# Patient Record
Sex: Male | Born: 1955
Health system: Southern US, Community
[De-identification: ages and names within clinical notes are randomized; demographics above are authoritative.]

## PROBLEM LIST (undated history)

## (undated) DIAGNOSIS — E119 Type 2 diabetes mellitus without complications: Secondary | ICD-10-CM

## (undated) DIAGNOSIS — D696 Thrombocytopenia, unspecified: Secondary | ICD-10-CM

## (undated) DIAGNOSIS — M4800 Spinal stenosis, site unspecified: Secondary | ICD-10-CM

## (undated) DIAGNOSIS — M1990 Unspecified osteoarthritis, unspecified site: Secondary | ICD-10-CM

## (undated) DIAGNOSIS — R7303 Prediabetes: Secondary | ICD-10-CM

## (undated) DIAGNOSIS — I10 Essential (primary) hypertension: Secondary | ICD-10-CM

## (undated) DIAGNOSIS — J449 Chronic obstructive pulmonary disease, unspecified: Secondary | ICD-10-CM

## (undated) DIAGNOSIS — I2510 Atherosclerotic heart disease of native coronary artery without angina pectoris: Secondary | ICD-10-CM

## (undated) DIAGNOSIS — Z923 Personal history of irradiation: Secondary | ICD-10-CM

## (undated) DIAGNOSIS — E785 Hyperlipidemia, unspecified: Secondary | ICD-10-CM

## (undated) DIAGNOSIS — I4891 Unspecified atrial fibrillation: Secondary | ICD-10-CM

## (undated) DIAGNOSIS — I3139 Other pericardial effusion (noninflammatory): Secondary | ICD-10-CM

## (undated) DIAGNOSIS — C3491 Malignant neoplasm of unspecified part of right bronchus or lung: Secondary | ICD-10-CM

## (undated) DIAGNOSIS — R0602 Shortness of breath: Secondary | ICD-10-CM

## (undated) DIAGNOSIS — G4730 Sleep apnea, unspecified: Secondary | ICD-10-CM

## (undated) DIAGNOSIS — K219 Gastro-esophageal reflux disease without esophagitis: Secondary | ICD-10-CM

## (undated) DIAGNOSIS — M7540 Impingement syndrome of unspecified shoulder: Secondary | ICD-10-CM

## (undated) HISTORY — PX: HAND SURGERY: SHX662

## (undated) HISTORY — DX: Other pericardial effusion (noninflammatory): I31.39

## (undated) HISTORY — PX: EYE SURGERY: SHX253

## (undated) HISTORY — DX: Thrombocytopenia, unspecified: D69.6

## (undated) HISTORY — DX: Chronic obstructive pulmonary disease, unspecified: J44.9

## (undated) HISTORY — DX: Malignant neoplasm of unspecified part of right bronchus or lung: C34.91

## (undated) NOTE — *Deleted (*Deleted)
Thoracic Location of Tumor / Histology:  RIGHT upper lobe pulmonary nodule, mediastinal lymphadenopathy (suspicious for slow-growing non-small cell lung cancer)  Patient presented with symptoms of: patient was found to have a small right upper lobe pulmonary nodule and mediastinal lymphadenopathy on a follow-up CT scan on 03/14/2020 to do surveillance of his interstitial lung disease; the PET scan on 03/26/2020 showed that the nodule was hypermetabolic.  Biopsies revealed:  07/30/2020 FINAL MICROSCOPIC DIAGNOSIS:  A. LUNG, RUL, BRUSHING:  - No malignant cells identified  B. LUNG, RUL, FINE NEEDLE ASPIRATION:  - Non-diagnostic  C. LUNG, RUL, LAVAGE: - Atypical cells present  - See comment DIAGNOSTIC COMMENTS:  There are rare atypical cells present. There is no accompanying cell block for further possible workup  Tobacco/Marijuana/Snuff/ETOH use:  Current every day smoker (has smoked ~1/2 pack per day for over 50 years). Does not use smokeless tobacco, drink alcohol, or use illicit drugs  Past/Anticipated interventions by cardiothoracic surgery, if any:  08/06/2020 Dr. Levy Pupa --Enlarging right upper lobe pulmonary nodule, atypical cells on recent BAL.   --Suspect that this is slow-growing non-small cell lung cancer.  We will discuss his case in thoracic conference, ultimately would likely refer him for radiation, SBRT.   Given his functional capacity I do not believe he would be a good candidate for primary resection.  07/30/2020 Dr. Levy Pupa Flexible video fiberoptic bronchoscopy with electromagnetic navigation and biopsies  Past/Anticipated interventions by medical oncology, if any:  No referral placed at this time   Signs/Symptoms  Weight changes, if any: ***  Respiratory complaints, if any: ***  Hemoptysis, if any: ***  Pain issues, if any:  ***  SAFETY ISSUES:  Prior radiation? ***  Pacemaker/ICD? ***   Possible current pregnancy? N/A  Is the patient on  methotrexate? ***  Current Complaints / other details:  ***

---

## 1998-08-30 ENCOUNTER — Ambulatory Visit (HOSPITAL_COMMUNITY): Admission: RE | Admit: 1998-08-30 | Discharge: 1998-08-30 | Payer: Self-pay | Admitting: Orthopedic Surgery

## 1998-09-27 ENCOUNTER — Ambulatory Visit (HOSPITAL_COMMUNITY): Admission: RE | Admit: 1998-09-27 | Discharge: 1998-09-27 | Payer: Self-pay | Admitting: Family Medicine

## 1998-09-27 ENCOUNTER — Encounter: Payer: Self-pay | Admitting: Family Medicine

## 2000-05-07 ENCOUNTER — Encounter: Payer: Self-pay | Admitting: Family Medicine

## 2000-05-07 ENCOUNTER — Ambulatory Visit (HOSPITAL_COMMUNITY): Admission: RE | Admit: 2000-05-07 | Discharge: 2000-05-07 | Payer: Self-pay | Admitting: Family Medicine

## 2000-06-18 ENCOUNTER — Ambulatory Visit (HOSPITAL_COMMUNITY): Admission: RE | Admit: 2000-06-18 | Discharge: 2000-06-18 | Payer: Self-pay | Admitting: *Deleted

## 2002-06-23 ENCOUNTER — Encounter: Payer: Self-pay | Admitting: Family Medicine

## 2002-06-23 ENCOUNTER — Ambulatory Visit (HOSPITAL_COMMUNITY): Admission: RE | Admit: 2002-06-23 | Discharge: 2002-06-23 | Payer: Self-pay | Admitting: Family Medicine

## 2003-07-05 ENCOUNTER — Ambulatory Visit (HOSPITAL_BASED_OUTPATIENT_CLINIC_OR_DEPARTMENT_OTHER): Admission: RE | Admit: 2003-07-05 | Discharge: 2003-07-05 | Payer: Self-pay | Admitting: Family Medicine

## 2003-12-07 ENCOUNTER — Encounter: Admission: RE | Admit: 2003-12-07 | Discharge: 2003-12-07 | Payer: Self-pay | Admitting: Cardiology

## 2003-12-10 ENCOUNTER — Inpatient Hospital Stay (HOSPITAL_BASED_OUTPATIENT_CLINIC_OR_DEPARTMENT_OTHER): Admission: RE | Admit: 2003-12-10 | Discharge: 2003-12-10 | Payer: Self-pay | Admitting: Cardiology

## 2005-09-18 ENCOUNTER — Ambulatory Visit (HOSPITAL_COMMUNITY): Admission: RE | Admit: 2005-09-18 | Discharge: 2005-09-18 | Payer: Self-pay | Admitting: Orthopedic Surgery

## 2006-11-18 ENCOUNTER — Ambulatory Visit (HOSPITAL_BASED_OUTPATIENT_CLINIC_OR_DEPARTMENT_OTHER): Admission: RE | Admit: 2006-11-18 | Discharge: 2006-11-18 | Payer: Self-pay | Admitting: Orthopedic Surgery

## 2006-11-18 ENCOUNTER — Encounter (INDEPENDENT_AMBULATORY_CARE_PROVIDER_SITE_OTHER): Payer: Self-pay | Admitting: Specialist

## 2007-08-25 ENCOUNTER — Ambulatory Visit (HOSPITAL_BASED_OUTPATIENT_CLINIC_OR_DEPARTMENT_OTHER): Admission: RE | Admit: 2007-08-25 | Discharge: 2007-08-25 | Payer: Self-pay | Admitting: Orthopedic Surgery

## 2007-08-25 ENCOUNTER — Encounter (INDEPENDENT_AMBULATORY_CARE_PROVIDER_SITE_OTHER): Payer: Self-pay | Admitting: Orthopedic Surgery

## 2007-09-20 ENCOUNTER — Ambulatory Visit (HOSPITAL_BASED_OUTPATIENT_CLINIC_OR_DEPARTMENT_OTHER): Admission: RE | Admit: 2007-09-20 | Discharge: 2007-09-20 | Payer: Self-pay | Admitting: Orthopedic Surgery

## 2010-04-21 ENCOUNTER — Ambulatory Visit (HOSPITAL_COMMUNITY): Admission: RE | Admit: 2010-04-21 | Discharge: 2010-04-21 | Payer: Self-pay | Admitting: Family Medicine

## 2010-04-30 DIAGNOSIS — K219 Gastro-esophageal reflux disease without esophagitis: Secondary | ICD-10-CM | POA: Insufficient documentation

## 2010-04-30 DIAGNOSIS — I10 Essential (primary) hypertension: Secondary | ICD-10-CM | POA: Insufficient documentation

## 2010-04-30 DIAGNOSIS — J42 Unspecified chronic bronchitis: Secondary | ICD-10-CM | POA: Insufficient documentation

## 2010-04-30 DIAGNOSIS — I2510 Atherosclerotic heart disease of native coronary artery without angina pectoris: Secondary | ICD-10-CM | POA: Insufficient documentation

## 2010-05-02 ENCOUNTER — Ambulatory Visit: Payer: Self-pay | Admitting: Internal Medicine

## 2010-05-02 DIAGNOSIS — F17210 Nicotine dependence, cigarettes, uncomplicated: Secondary | ICD-10-CM | POA: Insufficient documentation

## 2010-05-02 DIAGNOSIS — Z77090 Contact with and (suspected) exposure to asbestos: Secondary | ICD-10-CM | POA: Insufficient documentation

## 2010-05-23 ENCOUNTER — Ambulatory Visit: Payer: Self-pay | Admitting: Internal Medicine

## 2010-07-07 ENCOUNTER — Ambulatory Visit: Payer: Self-pay | Admitting: Internal Medicine

## 2010-10-21 NOTE — Assessment & Plan Note (Signed)
Summary: Pulmonary/ ext summary f/u ov   Copy to:  Dr. Elias Else Primary Provider/Referring Provider:  Dr. Elias Else  CC:  Dyspnea- improved with symbicort and less humid weather.  History of Present Illness: 55 yowm still smoking seen for copd by [facility] in Pulmonary clinic in  2002 at wt 248 c/o dyspnea > cough.    May 02, 2010  1st pulmonary office eval in emr era with progressive doe xyears  to point where has trouble going up any up any kind of incline or more than slow pace flat and also trouble at night sleeping flat due to sob, wakes up and uses inhalter 4/7 nights  assoc with increased hb and dysphagia >  better on dexilant and zantac but breating no better x after saba.   assoc mild nasal congestion/ sneezing/ sore throat.  Please schedule a follow-up appointment in 4 weeks, sooner if needed with PFTs Work on inhaler technique:  relax and blow all the way out then take a nice smooth deep breath back in, triggering the inhaler at same time you start breathing in hold a few secs then rinse and gargle Start spiriva one capsule each am stop vit E  Try reduce your use of your rescue inhaler   July 07, 2010 Dyspnea- improved with symbicort and less humid weather, no change on spiriva so stopped it.  Pt denies any significant sore throat, dysphagia, itching, sneezing,  nasal congestion or excess secretions,  fever, chills, sweats, unintended wt loss, pleuritic or exertional cp, hempoptysis, change in activity tolerance  orthopnea pnd or leg swelling     Current Medications (verified): 1)  Symbicort 160-4.5 Mcg/act Aero (Budesonide-Formoterol Fumarate) .... 2 Puffs Two Times A Day 2)  Spiriva Handihaler 18 Mcg  Caps (Tiotropium Bromide Monohydrate) .... Two Puffs in Handihaler Daily One  Capsule Only- Stopped Taking 3)  Dexilant 60 Mg Cpdr (Dexlansoprazole) .... Take  One 30-60 Min Before First Meal of The Day- Finished Samples- Stopped Taking 4)  Aspirin 81 Mg Tbec (Aspirin)  .[name].. 1 Once Daily 5)  Ranitidine Hcl 300 Mg Tabs (Ranitidine Hcl) .[name].. 1 After Bfast and One At Bedtime 6)  Ventolin Hfa 108 (90 Base) Mcg/act Aers (Albuterol Sulfate) .... 2 Puffs Every 4-6 Hrs As Needed (Same As Proaire)  Allergies (verified): No Known Drug Allergies  Past History:  Past Medical History: HISTORY OF ASBESTOS EXPOSURE (ICD-V15.84) ESSENTIAL HYPERTENSION (ICD-401.9) CAD (ICD-414.00) COPD (ICD-496)/ AB     - PFT's 04/15/01  FEV1 2.81 ( 86%) ratio 86      - PFT's  05/23/10   FEV1 2.08 (71%) ratio 65,  Better by 12 % p B2, DLCO 59%     - HFA 75% p coaching May 02, 2010 >  90% July 07, 2010  GERD (ICD-530.81)  Vital Signs:  Patient profile:   55 year old male Weight:      254 pounds O2 Sat:      95 % on Room air Temp:     97.7 degrees F oral Pulse rate:   82 / minute BP sitting:   124 / 80  (left arm) Cuff size:   large  Vitals Entered ByVernie Murders (July 07, 2010 10:28 AM)  O2 Flow:  Room air  Physical Exam  Additional Exam:  amb obese wm with gruff voice wt 252 May 03, 2010 > 254 July 07, 2010  HEENT mild turbinate edema.  Oropharynx no thrush or excess pnd or cobblestoning.  No JVD or  cervical adenopathy. Mild accessory muscle hypertrophy. Trachea midline, nl thryroid. Chest was hyperinflated by percussion with diminished breath sounds and moderate increased exp time without wheeze. Hoover sign positive at mid inspiration. Regular rate and rhythm without murmur gallop or rub or increase P2 or edema.  Abd: no hsm, nl excursion. Ext warm without cyanosis or clubbing.     Clinical Reports Reviewed:  CXR:  03/11/2010: CXR Results:  Mild chronic bronchitis changes, no acute findings   Impression & Recommendations:  Problem # 1:  COPD (ICD-496) GOLD III but still smoking    DDX of  difficult airways managment all start with A and  include Adherence, Ace Inhibitors, Acid Reflux, Active Sinus Disease, Alpha 1 Antitripsin deficiency, Anxiety  masquerading as Airways dz,  ABPA,  allergy(esp in young), Aspiration (esp in elderly), Adverse effects of DPI,  Active smokers, plus one B  = Beta blocker use..    Adherence:  I spent extra time with the patient today explaining optimal mdi  technique.  This improved from  75-90%  ? Acid reflux:  rx with diet and h2  Active smoking :  see #3  Problem # 2:  SMOKER (ICD-305.1)  I took this opportunity to educate the patient regarding the consequences of smoking in airway disorders based on all the data we have from the multiple national lung health studies indicating that smoking cessation, not choice of inhalers or physicians, is the most important aspect of care.    I emphasized that although we never turn away smokers from the pulmonary clinic, we do ask that they understand that the recommendations that were made won't work nearly as well in the presence of continued cigarette exposure and we may reach a point where we can't help the patient if he/she can't quit smoking.    Orders: Est. Patient Level IV (16109)  Problem # 3:  HISTORY OF ASBESTOS EXPOSURE (ICD-V15.84)  Reviewed high risk of lung ca also  Orders: Est. Patient Level IV (60454)  Medications Added to Medication List This Visit: 1)  Spiriva Handihaler 18 Mcg Caps (Tiotropium bromide monohydrate) .... Two puffs in handihaler daily one  capsule only- stopped taking 2)  Dexilant 60 Mg Cpdr (Dexlansoprazole) .... Take  one 30-60 min before first meal of the day- finished samples- stopped taking  Patient Instructions: 1)  Stopping smoking is the only way to preserve your lung function at it's present level. 2)  Rec yearly chest xray  in June 3)  Please schedule a follow-up appointment as needed.

## 2010-10-21 NOTE — Miscellaneous (Signed)
Summary: Orders Update-pft charges//jwr  Clinical Lists Changes  Orders: Added new Service order of Carbon Monoxide diffusing w/capacity (94720) - Signed Added new Service order of Lung Volumes (94240) - Signed Added new Service order of Spirometry (Pre & Post) (94060) - Signed 

## 2010-10-21 NOTE — Assessment & Plan Note (Signed)
Summary: Pulmonary/ new pt eval for copd HFa 75% p coaching   Visit Type:  Initial Consult Copy to:  Dr. Elias Else Primary Provider/Referring Provider:  Dr. Elias Else  CC:  Dyspnea.  History of Present Illness: 81 yowm still smoking seen for copd by [facility] in Pulmonary clinic in  2002 at wt 248 c/o dyspnea > cough.    May 02, 2010  1st pulmonary office eval in emr era with progressive doe xyears  to point where has trouble going up any up any kind of incline or more than slow pace flat and also trouble at night sleeping flat due to sob, wakes up and uses inhalter 4/7 nights  assoc with increased hb and dysphagia >  better on dexilant and zantac but breating no better x after saba.   assoc mild nasal congestion/ sneezing/ sore throat.  Pt denies any significant  excess or purulent nasal secretions,  fever, chills, sweats, unintended wt loss, pleuritic or exertional cp, hempoptysis,fluctation  in poor activity tolerance  orthopnea pnd or leg swelling.  Pt also denies any obvious fluctuation in symptoms with weather or environmental change or other alleviating or aggravating factors.          Preventive Screening-Counseling & Management  Alcohol-Tobacco     Smoking Status: current     Smoking Cessation Counseling: yes  Current Medications (verified): 1)  Ventolin Hfa 108 (90 Base) Mcg/act Aers (Albuterol Sulfate) .... 2 Puffs Every 4-6 Hrs As Needed 2)  Symbicort 160-4.5 Mcg/act Aero (Budesonide-Formoterol Fumarate) .... 2 Puffs Two Times A Day 3)  Aspirin 81 Mg Tbec (Aspirin) .[name].. 1 Once Daily 4)  Ranitidine Hcl 300 Mg Tabs (Ranitidine Hcl) .[name].. 1 Two Times A Day 5)  Vitamin E 400 Unit Caps (Vitamin E) .[name].. 1 Once Daily 6)  Dexilant 60 Mg Cpdr (Dexlansoprazole) .[name].. 1 Once Daily  Allergies (verified): No Known Drug Allergies  Past History:  Past Medical History: HISTORY OF ASBESTOS EXPOSURE (ICD-V15.84) ESSENTIAL HYPERTENSION (ICD-401.9) CAD (ICD-414.00) COPD  (ICD-496)/ AB     - PFT's 04/15/01  FEV1 2.81 ( 86%) ratio 86      - HFA 75% p coaching May 02, 2010  GERD (ICD-530.81)  Past Surgical History: Eye surgery Hand surgery  Family History: Negative for respiratory diseases or atopy   Social History: Married Children Current smoker since age 51.  Smokes up to 3 ppd. 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  The patient denies productive cough, non-productive cough, coughing up blood, chest pain, irregular heartbeats, loss of appetite, abdominal pain, tooth/dental problems, headaches, nasal congestion/difficulty breathing through nose, itching, ear ache, depression, hand/feet swelling, rash, change in color of mucus, and fever.    Vital Signs:  Patient profile:   55 year old male Height:      65 inches Weight:      252 pounds BMI:     42.09 O2 Sat:      98 % on Room air Temp:     98.1 degrees F oral Pulse rate:   87 / minute BP sitting:   120 / 74  (right arm) Cuff size:   large  Vitals Entered By: Vernie Murders (May 02, 2010 9:51 AM)  O2 Flow:  Room air  Physical Exam  Additional Exam:  amb obese wm with gruff voice wt 252 May 03, 2010  HEENT mild turbinate edema.  Oropharynx no thrush or excess pnd or cobblestoning.  No JVD or cervical adenopathy. Mild accessory muscle hypertrophy. Trachea midline, nl thryroid. Chest was hyperinflated by percussion with diminished breath sounds and moderate increased exp time without wheeze. Hoover sign positive at mid inspiration. Regular rate and rhythm without murmur gallop or rub or increase P2 or edema.  Abd: no hsm, nl excursion. Ext warm without cyanosis or clubbing.     CXR  Procedure date:  03/11/2010  Findings:      Mild chronic bronchitis  changes, no acute findings  Impression & Recommendations:  Problem # 1:  COPD (ICD-496) Relatively mild clinically though with prob sign ab component suggested by his perceived need for saba.    DDX of  difficult airways managment all start with A and  include Adherence, Ace Inhibitors, Acid Reflux, Active Sinus Disease, Alpha 1 Antitripsin deficiency, Anxiety masquerading as Airways dz,  ABPA,  allergy(esp in young), Aspiration (esp in elderly), Adverse effects of DPI,  Active smokers, plus one B  = Beta blocker use.[name]   Active smoking biggest concern, see #2  Acid reflux better on dexilant, change 2nd dose of zantac to hs  Adherence:  I spent extra time with the patient today explaining optimal mdi  technique.  This improved from  50-75%  Problem # 2:  SMOKER (ICD-305.1)  I took this opportunity to educate the patient regarding the consequences of smoking in airway disorders based on all the data we have from the multiple national lung health studies indicating that smoking cessation, not choice of inhalers or physicians, is the most important aspect of care.    Discussed but not ready to committ to quit at this point - emphasized risks involved in continuing smoking and that patient should consider these in the context of the cost of smoking relative to the benefit obtained.   I emphasized that although we never turn away smokers from the pulmonary clinic, we do ask that they understand that the recommendations that were made won't work nearly as well in the presence of continued cigarette exposure and we may reach a point where we can't help the patient if he can't quit smoking.    Orders: Consultation Level V (14782)  Problem # 3:  HISTORY OF ASBESTOS EXPOSURE (ICD-V15.84)  No evidence of asbestos effects on most recent cxr  Orders: Consultation Level V (95621)  Medications Added to Medication List This Visit: 1)  Symbicort 160-4.5 Mcg/act Aero (Budesonide-formoterol fumarate)  .... 2 puffs two times a day 2)  Spiriva Handihaler 18 Mcg Caps (Tiotropium bromide monohydrate) .... Two puffs in handihaler daily one  capsule only 3)  Dexilant 60 Mg Cpdr (Dexlansoprazole) .... Take  one 30-60 min before first meal of the day 4)  Ranitidine Hcl 300 Mg Tabs (Ranitidine hcl) .[name].. 1 two times a day 5)  Vitamin E 400 Unit Caps (Vitamin e) .[name].. 1 once daily 6)  Dexilant 60 Mg Cpdr (Dexlansoprazole) .[name].. 1 once daily 7)  Aspirin 81 Mg Tbec (Aspirin) .[name].. 1 once daily 8)  Ventolin Hfa 108 (90 Base) Mcg/act Aers (Albuterol sulfate) .... 2 puffs every 4-6 hrs as needed 9)  Ranitidine Hcl 300 Mg Tabs (Ranitidine hcl) .[name].. 1 after bfast and one at bedtime 10)  Ventolin Hfa 108 (90 Base) Mcg/act Aers (Albuterol sulfate) .... 2 puffs every 4-6 hrs as needed (same as proaire)  Complete Medication List:  1)  Symbicort 160-4.5 Mcg/act Aero (Budesonide-formoterol fumarate) .... 2 puffs two times a day 2)  Spiriva Handihaler 18 Mcg Caps (Tiotropium bromide monohydrate) .... Two puffs in handihaler daily one  capsule only 3)  Dexilant 60 Mg Cpdr (Dexlansoprazole) .... Take  one 30-60 min before first meal of the day 4)  Aspirin 81 Mg Tbec (Aspirin) .[name].. 1 once daily 5)  Ranitidine Hcl 300 Mg Tabs (Ranitidine hcl) .[name].. 1 after bfast and one at bedtime 6)  Ventolin Hfa 108 (90 Base) Mcg/act Aers (Albuterol sulfate) .... 2 puffs every 4-6 hrs as needed (same as proaire)  Patient Instructions: 1)  Please schedule a follow-up appointment in 4 weeks, sooner if needed with PFTs 2)  Work on inhaler technique:  relax and blow all the way out then take a nice smooth deep breath back in, triggering the inhaler at same time you start breathing in hold a few secs then rinse and gargle 3)  Start spiriva one capsule each am 4)  stop vit E 5)  GERD (REFLUX)  is a common cause of respiratory symptoms. It commonly presents without heartburn and can be treated with medication, but also with lifestyle changes  including avoidance of late meals, excessive alcohol, smoking cessation, and avoid fatty foods, chocolate, peppermint, colas, red wine, and acidic juices such as orange juice. NO MINT OR MENTHOL PRODUCTS SO NO COUGH DROPS  6)  USE SUGARLESS CANDY INSTEAD (jolley ranchers)  7)  NO OIL BASED VITAMINS  8)  Try reduce your use of your rescue inhaler  Prescriptions: SPIRIVA HANDIHALER 18 MCG  CAPS (TIOTROPIUM BROMIDE MONOHYDRATE) Two puffs in handihaler daily one  capsule only  #30 x 11   Entered and Authorized by:   Nyoka Cowden MD   Signed by:   Nyoka Cowden MD on 05/02/2010   Method used:   Print then Give to Patient   RxID:   [id number]

## 2011-02-03 NOTE — Op Note (Signed)
NAMEDEAIRE, MCWHIRTER              ACCOUNT NO.:  [id number]   MEDICAL RECORD NO.:  [id number]          PATIENT TYPE:  AMB   LOCATION:  DSC                          FACILITY:  [facility]   PHYSICIAN:  Cindee Salt, M.D.       DATE OF BIRTH:  Feb 07, 1956   DATE OF PROCEDURE:  08/25/2007  DATE OF DISCHARGE:                               OPERATIVE REPORT   PREOPERATIVE DIAGNOSIS:  Nonunion attempted fusion, IP joint right  thumb.   POSTOPERATIVE DIAGNOSIS:  Nonunion attempted fusion, IP joint right  thumb.   OPERATION:  Incision and drainage with removal of hardware.   SURGEON:  Cindee Salt, M.D.   ASSISTANTCarolyne Fiscal, R.N.   ANESTHESIA:  Axillary block.   HISTORY:  The patient is a 55 year old male with a history of swelling  of the IP joint of his thumb with degenerative changes.  He has  undergone attempted fusion of this; however, has gone on to a nonunion  with some destruction of bone, which was felt to be due to his nonunion  site.  There has always been a concern that he may have an infectious  process, which has been unable to be established.  He is admitted now  for possible fusion, using infused bone graft with placement of extract  screw.  The patient is aware of risks and complications, including  continuation of the nonunion and the possibility of infection; injury to  arteries, nerves and tendons, and dystrophy.  He has elected to proceed  to have this done in the preoperative area.  The patient is seen,  questions encouraged and answered.   PROCEDURE:  The patient was brought to the operating room, where an  axillary block was carried out without difficulty.  He was prepped using  DuraPrep, supine position, right arm free.  The limb was exsanguinated  with an Esmarch bandage; tourniquet placed high on the arm and it was  inflated to 250 mmHg.  The old incision was used.  Drainage was  immediately encountered.  This was a serous type clear fluid.  This was  cultured for  both aerobic, anaerobic, fungal and atypical mycobacterial  infection.  The dissection was then carried down to the staples.  These  were found to be extremely loose.  These were then removed.  Significant  granulation tissue was present about these.  This was removed with a  curet.  This was also sent to pathology and for culture.  Both staples  were removed.   It was decided at this point and time not to proceed with attempted  fusion, and that this would leave behind foreign material both as the  bone graft and as fixation.  The wound was copiously irrigated with  saline.  The dorsal component of the wound over the extensor scar was  repaired with figure-of-eight 4-0 Vicryl Rapide sutures.  The  wounds over the 2 staples were left open and packed.  This was done  after irrigation.  A sterile compressive dressing and splint was  applied.  The patient tolerated the procedure well and was taken to  the  recovery room for observation in satisfactory condition.  He will be  discharged to home; to return in 1 week.  On Vicodin.           ______________________________  Cindee Salt, M.D.     GK/MEDQ  D:  08/25/2007  T:  08/25/2007  Job:  [id number]

## 2011-02-03 NOTE — Op Note (Signed)
NAME:  Daniel Ball, FLATT NO.:  [id number]   MEDICAL RECORD NO.:  [id number]          PATIENT TYPE:  AMB   LOCATION:  DSC                          FACILITY:  [facility]   PHYSICIAN:  Cindee Salt, M.D.       DATE OF BIRTH:  November 17, 1955   DATE OF PROCEDURE:  09/20/2007  DATE OF DISCHARGE:                               OPERATIVE REPORT   PREOPERATIVE DIAGNOSIS:  Nonunion interphalangeal joint, right thumb.   POSTOPERATIVE DIAGNOSIS:  Nonunion interphalangeal joint, right thumb.   OPERATION:  Acutrak screw implantation for fusion IP joint with InFUSE  bone graft, right thumb.   SURGEON:  Cindee Salt, M.D.   ASSISTANTCarolyne Fiscal, R.N.   ANESTHESIA:  Axillary block.   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  He has  undergone attempted fusion with Memodyne staples.  This however has gone  to nonunion with destruction of the bone.  He has had this biopsied with  cultures taken.  These have turned out to be negative.  He is admitted  now for InFUSE bone grafting with Acutrak screw fixation to his right  thumb IP joint.  He is aware of risks and complications including  infection, nonunion, injury to arteries, nerves, tendons, incomplete  relief of symptoms, dystrophy.  In the preoperative area questions were  again encouraged and answered, the extremity marked by both patient and  surgeon, antibiotic given.   PROCEDURE:  The patient was brought to the operating room where an  axillary block was carried out without difficulty.  He was prepped using  DuraPrep, supine position, right arm free.  The limb was exsanguinated  with an Esmarch bandage, tourniquet placed high on the arm, was inflated  to 250 mmHg.  An H incision was used, carried down through subcutaneous  tissue, through the extensor tendon.  The nonunion site was immediately  apparent.  This was fully debrided, no purulence was seen.   Soft tissue  was removed from bone ends until bleeding was assured.  A regular guide  pin for an Acutrak screw was then placed under image intensification  through the distal phalanx.  This was then replaced in a retrograde  manner with the blunt end.  An InFUSE 7.7 mL graft was then soaked, the  guide pin was then passed across into the proximal phalanx under image  intensification with the IP joint in an extended position.  A 20 mm mini  screw was then selected.  The guide pin was then re-selected for a mini  guide pin.  This was then placed and incision made distally.  The 20 mm  mini Acutrak was then passed through the distal phalanx until it began  to emerge from the distal phalanx proximally.  After a 15 minutes soak  time the InFUSE bone grafts were placed on either side of the guide pin  after cutting it in half.  The bone was then compressed, irrigation was  performed prior to placement of the InFUSE bone graft.  The  Acutrak  screw was then passed across the interphalangeal joint.  The guide pin  removed once the screw head engaged the proximal phalanx, and the screw  was driven so it was half in and the distal half the proximal phalanx.  This firmly fixed the position.  X-rays confirmed positioning.  The  wounds were then closed with interrupted 5-0 Vicryl Rapide sutures  including the stab wound through the pulp of the thumb.  The tourniquet  was deflated.  The thumb immediately pinked.  A sterile compressive  dressing, thumb spica splint was applied.  The patient tolerated the  procedure well and was taken to the recovery room for observation in  satisfactory condition.  He will be discharged home to return to the  [facility] in 1 week on Percocet and doxycycline.           ______________________________  Cindee Salt, M.D.     GK/MEDQ  D:  09/20/2007  T:  09/21/2007  Job:  [id number]

## 2011-02-06 NOTE — Cardiovascular Report (Signed)
NAME:  Daniel Ball, Daniel Ball                        ACCOUNT NO.:  [id number]   MEDICAL RECORD NO.:  [id number]                   PATIENT TYPE:  OIB   LOCATION:  6501                                 FACILITY:  [facility]   PHYSICIAN:  W. Ashley Royalty., M.D.         DATE OF BIRTH:  May 06, 1956   DATE OF PROCEDURE:  12/10/2003  DATE OF DISCHARGE:  12/10/2003                              CARDIAC CATHETERIZATION   HISTORY:  The patient is a 55 year old male who has obesity, hypertension,  hyperlipidemia and severe dyspnea on exertion.  He has a previous Cardiolite  scan that was indeterminate.  He continues to complain of severe dyspnea.  Catheterization is advised to evaluate for significant coronary disease.   COMMENTS ABOUT PROCEDURE:  The patient tolerated the procedure well without  difficulty.  5 French catheters were used throughout the case.  Following  the procedure, he had good hemostasis and peripheral pulses noted.  He  required Versed 2 mg IV for sedation during the case.   HEMODYNAMIC DATA:  1. Aorta postcontrast:  120/73.  2. LV postcontrast:  120/6-11.   ANGIOGRAPHIC DATA:   LEFT VENTRICULOGRAM:  Performed in the 30 degree RAO projection.  The aortic  valve was normal.  The mitral valve was normal.  The left ventricle appears  normal in size.  The estimated ejection fraction is 60%.  Wall motion  appears normal.  Coronary arteries arise and distribute normally.  Calcification is seen involving the left coronary system.  There is minimal  calcification involved in the right coronary artery.  The left main coronary  artery is mildly calcified, but appears normal.   Left anterior descending:  Calcified proximally.  There is segmental 30%  proximal stenosis and some 20% mid vessel stenosis noted.  The vessel  extends around the apex and contains otherwise no significant disease.   The circumflex coronary artery has a high first marginal or intermediate  branch that appears  free of disease.  Contains a second marginal branch that  is also free of disease.   Right coronary artery:  Proximal 40% stenosis.  There is a segmental  eccentric 40-50% mid vessel stenosis noted.  Vessel supplies posterior  descending and a posterior lateral branch.   IMPRESSION:  1. Coronary artery disease with mild-to-moderate disease involving the left     anterior descending and right coronary artery.  2. Normal left ventricular function.   RECOMMENDATIONS:  Intensive medical therapy at this time.  He will need to  lose weight, stop smoking, control cholesterol and control blood pressure.                                               Darden Palmer., M.D.    WST/MEDQ  D:  12/10/2003  T:  12/10/2003  Job:  [id number]   cc:   Meredith Staggers, M.D.  [address], [address]  [place]  [place] [address]  Fax: [phone]

## 2011-02-06 NOTE — Op Note (Signed)
NAME:  TASHEEM, ELMS NO.:  [id number]   MEDICAL RECORD NO.:  [id number]          PATIENT TYPE:  AMB   LOCATION:  DSC                          FACILITY:  [facility]   PHYSICIAN:  Cindee Salt, M.D.       DATE OF BIRTH:  Aug 26, 1956   DATE OF PROCEDURE:  11/18/2006  DATE OF DISCHARGE:                               OPERATIVE REPORT   PREOPERATIVE DIAGNOSIS:  Arthritis right thumb interphalangeal joint.   POSTOPERATIVE DIAGNOSIS:  Arthritis right thumb interphalangeal joint.   OPERATION:  Fusion interphalangeal joint using Ossi staples.   SURGEON:  Cindee Salt, M.D.   ASSISTANT:  Carolyne Fiscal R.N.   ANESTHESIA:  Axillary block.   HISTORY:  The patient is a 55 year old male with a history of arthritis  of the IP joint of his thumb not responsive to conservative treatment.  He has elected to proceed with fusion of the IP joint.  He is aware of  risks and complications including nonunion, infection, injury to  arteries, nerves, tendons and incomplete relief of symptoms and  dystrophy.  The possibility of nail irregularity, the possibility of  breakdown of internal fixation.  In the preoperative area questions were  encouraged and answered.  The extremity marked by both the patient and  surgeon.  Antibiotic was given.   PROCEDURE:  The patient was brought to the operating room where an  axillary block was carried out without difficulty.  We was prepped using  DuraPrep, supine position, right arm free.  After 3-minute dry time he  was draped.  The limb was exsanguinated with an Esmarch bandage.  Tourniquet placed high on the arm was inflated 250 mmHg and H incision  was made over the IP joint of the thumb, carried down through  subcutaneous tissue.  Bleeders were electrocauterized.  A very  significant synovitis was present to the joint.  This was debrided after  incising the extensor tendon.  A synovectomy was performed.  Cultures  were taken both aerobic, anaerobic cultures.   The collateral ligaments  were then incised allowing book opening of the IP joint.  A rongeur was  then used to remove the remaining cartilage, subchondral bone.  A tenon  and trough were then fabricated in the proximal and distal phalanges.  The joint was then pinned with a 35 K-wire for stability.  X-rays the AP  lateral direction revealed a good position with approximately 5 degree  flexion.  The nail matrix was then lifted off from the distal phalanx,  allowing placement of two Ossi staples on the radial side.  A 11 mm  staple on the ulnar side and 9 mm staple were inserted.  These were 7 mm  in depth and 11 mm in depth and following placement of the two staples  impacting them, x-rays confirmed positioning.  The staples were heated  revealing excellent compression to the joint confirmed with image  intensification.  The wound was copiously irrigated with saline.  The  longitudinal pin removed.  The extensor tendon was then repaired with  figure-of-eight 4-0 Vicryl sutures and the skin with interrupted 5-0  nylon  sutures.  Sterile compressive dressing splint to the thumb was applied.  Thumb was extremely stable on discharge prior to placement of the splint  with testing.  The patient was taken to the recovery room for  observation in satisfactory condition.  He is discharged home to return  to the [facility] in one week on Vicodin.           ______________________________  Cindee Salt, M.D.     GK/MEDQ  D:  11/18/2006  T:  11/18/2006  Job:  [id number]

## 2011-06-26 LAB — BASIC METABOLIC PANEL
BUN: 15
Chloride: 102
Potassium: 4.1

## 2011-06-26 LAB — POCT HEMOGLOBIN-HEMACUE
Hemoglobin: 17.4 — ABNORMAL HIGH
Operator id: 128471

## 2011-06-29 LAB — ANAEROBIC CULTURE

## 2011-06-29 LAB — BASIC METABOLIC PANEL
GFR calc non Af Amer: >60
Glucose, Bld: 101 — ABNORMAL HIGH
Potassium: 5
Sodium: 138

## 2011-06-29 LAB — AFB CULTURE WITH SMEAR (NOT AT ARMC)
Acid Fast Smear: NONE SEEN
Acid Fast Smear: NONE SEEN

## 2011-06-29 LAB — WOUND CULTURE: Culture: NO GROWTH

## 2011-06-29 LAB — POCT HEMOGLOBIN-HEMACUE
Hemoglobin: 16.9
Operator id: 128471

## 2011-06-29 LAB — TISSUE CULTURE: Gram Stain: NONE SEEN

## 2011-06-29 LAB — FUNGUS CULTURE W SMEAR

## 2012-09-07 ENCOUNTER — Other Ambulatory Visit: Payer: Self-pay | Admitting: Specialist

## 2012-09-07 DIAGNOSIS — M549 Dorsalgia, unspecified: Secondary | ICD-10-CM

## 2012-09-16 ENCOUNTER — Ambulatory Visit
Admission: RE | Admit: 2012-09-16 | Discharge: 2012-09-16 | Disposition: A | Discharge status: Home / Self Care | Payer: PRIVATE HEALTH INSURANCE | Source: Ambulatory Visit | Attending: Specialist | Admitting: Specialist

## 2012-09-16 ENCOUNTER — Other Ambulatory Visit: Payer: Self-pay | Admitting: Specialist

## 2012-09-16 ENCOUNTER — Inpatient Hospital Stay
Admission: RE | Admit: 2012-09-16 | Discharge: 2012-09-16 | Disposition: A | Discharge status: Home / Self Care | Payer: Self-pay | Source: Ambulatory Visit | Attending: Specialist | Admitting: Specialist

## 2012-09-16 VITALS — BP 131/68 | HR 61

## 2012-09-16 DIAGNOSIS — M549 Dorsalgia, unspecified: Secondary | ICD-10-CM

## 2012-09-16 MED ORDER — IOHEXOL 180 MG/ML  SOLN
20.0000 mL | Freq: Once | INTRAMUSCULAR | Status: AC | PRN
  Administered 2012-09-16: 20 mL via INTRATHECAL

## 2012-09-16 MED ORDER — DIAZEPAM 5 MG PO TABS
10.0000 mg | ORAL_TABLET | Freq: Once | ORAL | Status: AC
  Administered 2012-09-16: 10 mg via ORAL

## 2012-11-05 ENCOUNTER — Other Ambulatory Visit: Payer: Self-pay

## 2013-07-27 ENCOUNTER — Other Ambulatory Visit: Payer: Self-pay

## 2013-09-29 ENCOUNTER — Other Ambulatory Visit: Payer: Self-pay | Admitting: Cardiology

## 2013-09-29 ENCOUNTER — Ambulatory Visit
Admission: RE | Admit: 2013-09-29 | Discharge: 2013-09-29 | Disposition: A | Discharge status: Home / Self Care | Payer: PRIVATE HEALTH INSURANCE | Source: Ambulatory Visit | Attending: Cardiology | Admitting: Cardiology

## 2013-09-29 DIAGNOSIS — R0989 Other specified symptoms and signs involving the circulatory and respiratory systems: Principal | ICD-10-CM

## 2013-09-29 DIAGNOSIS — R0609 Other forms of dyspnea: Secondary | ICD-10-CM

## 2013-10-30 ENCOUNTER — Other Ambulatory Visit: Payer: Self-pay | Admitting: Cardiology

## 2013-10-30 DIAGNOSIS — R0602 Shortness of breath: Secondary | ICD-10-CM

## 2013-10-31 ENCOUNTER — Ambulatory Visit (INDEPENDENT_AMBULATORY_CARE_PROVIDER_SITE_OTHER): Payer: PRIVATE HEALTH INSURANCE | Admitting: Internal Medicine

## 2013-10-31 DIAGNOSIS — R0602 Shortness of breath: Secondary | ICD-10-CM

## 2013-10-31 LAB — PULMONARY FUNCTION TEST
DL/VA % pred: 96 %
DL/VA: 4.07 ml/min/mmHg/L
DLCO unc % pred: 83 %
DLCO unc: 20.35 ml/min/mmHg
FEF 25-75 Post: 1.53 L/sec
FEF 25-75 Pre: 1.56 L/sec
FEF2575-%CHANGE-POST: -1 %
FEF2575-%Pred-Post: 62 %
FEF2575-%Pred-Pre: 64 %
FEV1-%CHANGE-POST: 1 %
FEV1-%PRED-PRE: 86 %
FEV1-%Pred-Post: 88 %
FEV1-POST: 2.55 L
FEV1-Pre: 2.5 L
FEV1FVC-%CHANGE-POST: 5 %
FEV1FVC-%Pred-Pre: 94 %
FEV6-%Change-Post: -3 %
FEV6-%PRED-POST: 92 %
FEV6-%Pred-Pre: 95 %
FEV6-PRE: 3.46 L
FEV6-Post: 3.33 L
FEV6FVC-%Change-Post: 0 %
FEV6FVC-%PRED-PRE: 104 %
FEV6FVC-%Pred-Post: 104 %
FVC-%Change-Post: -3 %
FVC-%Pred-Post: 88 %
FVC-%Pred-Pre: 91 %
FVC-POST: 3.36 L
FVC-Pre: 3.48 L
POST FEV1/FVC RATIO: 76 %
POST FEV6/FVC RATIO: 99 %
Pre FEV1/FVC ratio: 72 %
Pre FEV6/FVC Ratio: 99 %
RV % PRED: 110 %
RV: 2.11 L
TLC % PRED: 96 %
TLC: 5.57 L

## 2013-10-31 NOTE — Progress Notes (Signed)
PFT done today. 

## 2014-01-09 ENCOUNTER — Ambulatory Visit (INDEPENDENT_AMBULATORY_CARE_PROVIDER_SITE_OTHER): Payer: PRIVATE HEALTH INSURANCE | Admitting: Internal Medicine

## 2014-01-09 ENCOUNTER — Encounter: Payer: Self-pay | Admitting: Internal Medicine

## 2014-01-09 ENCOUNTER — Ambulatory Visit (INDEPENDENT_AMBULATORY_CARE_PROVIDER_SITE_OTHER)
Admission: RE | Admit: 2014-01-09 | Discharge: 2014-01-09 | Disposition: A | Discharge status: Home / Self Care | Payer: PRIVATE HEALTH INSURANCE | Source: Ambulatory Visit | Attending: Internal Medicine | Admitting: Internal Medicine

## 2014-01-09 VITALS — BP 140/84 | HR 69 | Temp 97.9°F | Ht 65.0 in | Wt 244.0 lb

## 2014-01-09 DIAGNOSIS — J42 Unspecified chronic bronchitis: Secondary | ICD-10-CM

## 2014-01-09 DIAGNOSIS — R0609 Other forms of dyspnea: Secondary | ICD-10-CM

## 2014-01-09 DIAGNOSIS — R0600 Dyspnea, unspecified: Secondary | ICD-10-CM

## 2014-01-09 DIAGNOSIS — R0989 Other specified symptoms and signs involving the circulatory and respiratory systems: Secondary | ICD-10-CM

## 2014-01-09 NOTE — Patient Instructions (Signed)
Please remember to go to the  x-ray department downstairs for your tests - we will call you with the results when they are available.  The key is to stop smoking completely before smoking completely stops you - you will need to stop for at least two weeks to reduce your risk of post op complications main ones are lung collapse, pneumonia and respiratory failures.

## 2014-01-09 NOTE — Progress Notes (Signed)
Quick Note:  Spoke with pt and notified of results per Dr. Wert. Pt verbalized understanding and denied any questions.  ______ 

## 2014-01-09 NOTE — Assessment & Plan Note (Addendum)
-   spirometry s sign obst so main issue is acquired (from smoking ) poor mucociliary function places him at risk of post op complications  See instructions for specific recommendations which were reviewed directly with the patient who was given a copy with highlighter outlining the key components.   Needs to stop smoking x 2 preop and may need periop duoneb/ early mobilization but otherwise no concerns and cleared for surgery

## 2014-01-09 NOTE — Assessment & Plan Note (Signed)
-   PFT's 10/31/13 wnl except for FEF- 25-75   Mostly limited by obesity/ back/deconditioning and not a contraindication to surgery

## 2014-01-09 NOTE — Progress Notes (Signed)
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 Pulmonary Consult    Pt needs pulmonary clearance for back surgery. He states his breathing is doing well and denies any respiratory co's at this time.    each am note some cough/ congestion/ gray brown x maybe a tbsp over the first hour, worst in winter.  No obvious other patterns in day to day or daytime variabilty or assoc limiting sob or  cp or chest tightness, subjective wheeze overt sinus or hb symptoms. No unusual exp hx or h/o childhood pna/ asthma or knowledge of premature birth.  Sleeping ok without nocturnal  or early am exacerbation  of respiratory  c/o's or need for noct saba. Also denies any obvious fluctuation of symptoms with weather or environmental changes or other aggravating or alleviating factors except as outlined above   Current Medications, Allergies, Complete Past Medical History, Past Surgical History, Family History, and Social History were reviewed in [employer] record.             Review of Systems  Constitutional: Negative for fever, chills, activity change, appetite change and unexpected weight change.  HENT: Negative for congestion, dental problem, postnasal drip, rhinorrhea, sneezing, sore throat, trouble swallowing and voice change.   Eyes: Negative for visual disturbance.  Respiratory: Negative for cough, choking and shortness of breath.   Cardiovascular: Negative for chest pain and leg swelling.  Gastrointestinal: Negative for nausea, vomiting and abdominal pain.  Genitourinary: Negative for difficulty urinating.  Musculoskeletal: Positive for arthralgias.   Skin: Negative for rash.  Psychiatric/Behavioral: Negative for behavioral problems and confusion.       Objective:   Physical Exam  amb hoarse wm nad  Wt Readings from Last 3 Encounters:  01/09/14 244 lb (110.678 kg)  07/07/10 254 lb (115.214 kg)  05/02/10 252 lb (114.306 kg)       HEENT: nl dentition, turbinates, and orophanx. Nl external ear canals without cough reflex   NECK :  without JVD/Nodes/TM/ nl carotid upstrokes bilaterally   LUNGS: no acc muscle use, clear to A and P bilaterally without cough on insp or exp maneuvers   CV:  RRR  no s3 or murmur or increase in P2, no edema   ABD:  Quite obese but soft and nontender with nl excursion in the supine position. No bruits or organomegaly, bowel sounds nl  MS:  warm without deformities, calf tenderness, cyanosis or clubbing  SKIN: warm and dry without lesions    NEURO:  alert, approp, no deficits    cxr 09/29/13 1. Question subtle left perihilar airspace opacification, which can  be seen with pneumonia. Followup to clearing or CT chest without  contrast is recommended in further evaluation as malignancy cannot  be definitively excluded. This will be a call report.  2. Question very mild central interstitial prominence, which can be  seen with pulmonary vascular congestion or a viral process.  CXR  01/09/2014 :  Stable cardiac mediastinal contours. Stable parahilar bronchial wall thickening. No large consolidative pulmonary opacities. Minimal bibasilar heterogeneous opacities. No definite pleural effusion or pneumothorax. Mid thoracic spine  degenerative change.      Assessment & Plan:

## 2014-01-30 ENCOUNTER — Encounter (HOSPITAL_COMMUNITY): Payer: Self-pay | Admitting: Pharmacy Technician

## 2014-01-30 NOTE — Progress Notes (Signed)
NEED PRE OP ORDERS PLEASE-- PRE OP 02/01/14  Thanks

## 2014-01-31 ENCOUNTER — Other Ambulatory Visit: Payer: Self-pay | Admitting: Orthopedic Surgery

## 2014-01-31 NOTE — H&P (Signed)
Daniel Ball is an 58 y.o. male.   Chief Complaint: back and leg pain HPI: Daniel Ball reports >5 years of gradually worsening back and B/L LE pain, weakness and numbness, limiting his ambulatory capacity. Refractory to multiple ESIs, activity modifications, relative rest, HEP, medications.   Past Medical History  Diagnosis Date  . COPD (chronic obstructive pulmonary disease)     No past surgical history on file.  No family history on file. Social History:  reports that he has been smoking Cigarettes.  He has a 135 pack-year smoking history. He has never used smokeless tobacco. He reports that he does not drink alcohol or use illicit drugs.  Allergies: No Known Allergies   (Not in a hospital admission)  No results found for this or any previous visit (from the past 48 hour(s)). No results found.  Review of Systems  Constitutional: Negative.   HENT: Negative.   Eyes: Negative.   Respiratory: Negative.   Cardiovascular: Negative.   Gastrointestinal: Negative.   Genitourinary: Negative.   Musculoskeletal: Positive for back pain and joint pain.  Skin: Negative.   Neurological: Positive for sensory change and focal weakness.  Psychiatric/Behavioral: Negative.     There were no vitals taken for this visit. Physical Exam  Constitutional: He is oriented to person, place, and time. He appears well-developed and well-nourished.  HENT:  Head: Normocephalic and atraumatic.  Eyes: Conjunctivae and EOM are normal. Pupils are equal, round, and reactive to light.  Neck: Normal range of motion. Neck supple.  Cardiovascular: Normal rate and regular rhythm.   Respiratory: Effort normal and breath sounds normal.  GI: Soft. Bowel sounds are normal.  Musculoskeletal:  Lower extremities just have pain in the buttocks and thighs. Slight quadriceps weakness is noted. He is hyporeflexic. There is no Babinski or clonus.  Lumbar spine exam reveals no evidence of soft tissue swelling,  ecchymosis or deformity. The abdomen is soft and nontender. Nontender over the trochanters. No cellulitis or lymphadenopathy.  Good range of motion of the lumbar spine without associated pain. Straight leg raise produces low back and buttock pain bilaterally. He is hyperreflexic. Motor is 5/5. Sensory exam is intact. No Babinski or clonus. The patient has good distal pulses. No DVT. No pain and normal range of motion without instability of the hips, knees and ankles.  Neurological: He is alert and oriented to person, place, and time. He has normal reflexes.  Skin: Skin is warm and dry.  Psychiatric: He has a normal mood and affect.    MRI and CT myelogram demonstrate fairly stenotic lesions at multiple levels.  Assessment/Plan Spinal stenosis Neurogenic claudication secondary to severe spinal stenosis at 2-3, 3-4 and 4-5. Some retrolisthesis at 2-3.  I had an extensive discussion with Daniel Ball concerning his current pathology, relevant anatomy and treatment options. Concerning his current shoulder, we discussed corticosteroid injections, activity modification and possible eventual shoulder arthroplasty, but I don't feel it's at the point where it would require that. In terms of his lumbar spine, we previously discussed lumbar decompression at 2-3, 3-4 and 4-5.  Risks and benefits of this procedure were discussed with the patient including worsening of symptoms, no changes in symptoms, recurrent disc herniation, scar tissue, epidural fibrosis, damage to neurovascular structures, cerebral spinal fluid leak which would require repair or patching, DVT, PE, anesthetic complications, etc. We discussed the perioperative course, the hospitalization, and the need for postoperative rehabilitation and the time estimate for recovery. We also discussed the possibility of future surgery including repeat  decompression, fusion. The patient was provided an illustrated handout which was discussed in  detail. Appropriate anatomic models were used as well.  He will require preoperative clearance by Dr. Alyson Ingles and Dr. Wynonia Lawman. Neutral positioning of the cervical spine. Also, I feel that we have eliminated severe stenosis noted in the cervical spine. I don't feel there is any surgical intervention of the cervical spine at this point in time. Possible selective nerve root block at C4-5 on the left. We will proceed with scheduling of his lumbar decompression. I gave him a note for out of work.  Plan microlumbar decompression L2-3, L3-4, L4-5  Jaclyn M. Bissell PA-C for Dr. Tonita Cong 01/31/2014, 2:13 PM

## 2014-02-01 ENCOUNTER — Ambulatory Visit (HOSPITAL_COMMUNITY)
Admission: RE | Admit: 2014-02-01 | Discharge: 2014-02-01 | Disposition: A | Discharge status: Home / Self Care | Payer: PRIVATE HEALTH INSURANCE | Source: Ambulatory Visit | Attending: Orthopedic Surgery | Admitting: Orthopedic Surgery

## 2014-02-01 ENCOUNTER — Encounter (HOSPITAL_COMMUNITY): Payer: Self-pay

## 2014-02-01 ENCOUNTER — Encounter (HOSPITAL_COMMUNITY)
Admission: RE | Admit: 2014-02-01 | Discharge: 2014-02-01 | Disposition: A | Discharge status: Home / Self Care | Payer: PRIVATE HEALTH INSURANCE | Source: Ambulatory Visit | Attending: Specialist | Admitting: Specialist

## 2014-02-01 ENCOUNTER — Ambulatory Visit (HOSPITAL_COMMUNITY)
Admission: RE | Admit: 2014-02-01 | Discharge: 2014-02-01 | Disposition: A | Discharge status: Home / Self Care | Payer: PRIVATE HEALTH INSURANCE | Source: Ambulatory Visit | Attending: Anesthesiology | Admitting: Anesthesiology

## 2014-02-01 DIAGNOSIS — G9589 Other specified diseases of spinal cord: Secondary | ICD-10-CM | POA: Insufficient documentation

## 2014-02-01 DIAGNOSIS — M51379 Other intervertebral disc degeneration, lumbosacral region without mention of lumbar back pain or lower extremity pain: Secondary | ICD-10-CM | POA: Insufficient documentation

## 2014-02-01 DIAGNOSIS — I700 Atherosclerosis of aorta: Secondary | ICD-10-CM | POA: Insufficient documentation

## 2014-02-01 DIAGNOSIS — Z01812 Encounter for preprocedural laboratory examination: Secondary | ICD-10-CM | POA: Insufficient documentation

## 2014-02-01 DIAGNOSIS — Z01818 Encounter for other preprocedural examination: Secondary | ICD-10-CM | POA: Insufficient documentation

## 2014-02-01 DIAGNOSIS — M5137 Other intervertebral disc degeneration, lumbosacral region: Secondary | ICD-10-CM | POA: Insufficient documentation

## 2014-02-01 HISTORY — DX: Sleep apnea, unspecified: G47.30

## 2014-02-01 HISTORY — DX: Atherosclerotic heart disease of native coronary artery without angina pectoris: I25.10

## 2014-02-01 HISTORY — DX: Essential (primary) hypertension: I10

## 2014-02-01 HISTORY — DX: Hyperlipidemia, unspecified: E78.5

## 2014-02-01 HISTORY — DX: Gastro-esophageal reflux disease without esophagitis: K21.9

## 2014-02-01 HISTORY — DX: Shortness of breath: R06.02

## 2014-02-01 HISTORY — DX: Spinal stenosis, site unspecified: M48.00

## 2014-02-01 HISTORY — DX: Unspecified osteoarthritis, unspecified site: M19.90

## 2014-02-01 LAB — SURGICAL PCR SCREEN
MRSA, PCR: NEGATIVE
Staphylococcus aureus: NEGATIVE

## 2014-02-01 LAB — CBC
HCT: 48.9 % (ref 39.0–52.0)
Hemoglobin: 17 g/dL (ref 13.0–17.0)
MCH: 31.5 pg (ref 26.0–34.0)
MCHC: 34.8 g/dL (ref 30.0–36.0)
MCV: 90.7 fL (ref 78.0–100.0)
PLATELETS: 162 10*3/uL (ref 150–400)
RBC: 5.39 MIL/uL (ref 4.22–5.81)
RDW: 13.3 % (ref 11.5–15.5)
WBC: 12.3 10*3/uL — AB (ref 4.0–10.5)

## 2014-02-01 LAB — BASIC METABOLIC PANEL
BUN: 15 mg/dL (ref 6–23)
CALCIUM: 9.9 mg/dL (ref 8.4–10.5)
CO2: 31 mEq/L (ref 19–32)
Chloride: 98 mEq/L (ref 96–112)
Creatinine, Ser: 0.87 mg/dL (ref 0.50–1.35)
GFR calc non Af Amer: >90 mL/min (ref >90)
Glucose, Bld: 104 mg/dL — ABNORMAL HIGH (ref 70–99)
Potassium: 5 mEq/L (ref 3.7–5.3)
SODIUM: 142 meq/L (ref 137–147)

## 2014-02-01 LAB — ABO/RH: ABO/RH(D): O POS

## 2014-02-01 NOTE — Progress Notes (Signed)
EKG 4/15, OV note Dr Wynonia Lawman  With clearance  4/15,stress eccho, myoview 1/15

## 2014-02-01 NOTE — Patient Instructions (Signed)
Your procedure is scheduled on:  02/07/14  [facility]  Report to [place] at 1130      AM.  Call this number if you have problems the morning of surgery: [place]  Do not eat food:After Midnight. Tuesday NIGHT-- MAY HAVE CLEAR LIQUIDS Wednesday MORNING UNTIL 0730am-  THEN NOTHING   Take these medicines the morning of surgery with A SIP OF WATER:Omeprazole May take Norco/ Albuterol inhaler if needed   .  Contacts, dentures or partial plates, or metal hairpins  can not be worn to surgery. Your family will be responsible for glasses, dentures, hearing aides while you are in surgery  Leave suitcase in the car. After surgery it may be brought to your room.  For patients admitted to the hospital, checkout time is 11:00 AM day of  discharge.                DO NOT WEAR JEWELRY, LOTIONS, POWDERS, OR PERFUMES.  WOMEN-- DO NOT SHAVE LEGS OR UNDERARMS FOR 48 HOURS BEFORE SHOWERS. MEN MAY SHAVE FACE.  Patients discharged the day of surgery will not be allowed to drive home. IF going home the day of surgery, you must have a driver and someone to stay with you for the first 24 hours                                                                                                                                    [place] - Preparing for Surgery Before surgery, you can play an important role.  Because skin is not sterile, your skin needs to be as free of germs as possible.  You can reduce the number of germs on your skin by washing with CHG (chlorahexidine gluconate) soap before surgery.  CHG is an antiseptic cleaner which kills germs and bonds with the skin to continue killing germs even after washing. Please DO NOT use if you have an allergy to CHG or antibacterial soaps.  If your skin becomes reddened/irritated stop using the CHG and inform your nurse when you arrive at Short Stay. Do not shave (including legs and underarms) for at  least 48 hours prior to the first CHG shower.  You may shave your face. Please follow these instructions carefully:  1.  Shower with CHG Soap the night before surgery and the  morning of Surgery.  2.  If you choose to wash your hair, wash your hair first as usual with your  normal  shampoo.  3.  After you shampoo, rinse your hair and body thoroughly to remove the  shampoo.                           4.  Use CHG as you would any other liquid soap.  You can apply chg  directly  to the skin and wash                       Gently with a scrungie or clean washcloth.  5.  Apply the CHG Soap to your body ONLY FROM THE NECK DOWN.   Do not use on open                           Wound or open sores. Avoid contact with eyes, ears mouth and genitals (private parts).                        Genitals (private parts) with your normal soap.             6.  Wash thoroughly, paying special attention to the area where your surgery  will be performed.  7.  Thoroughly rinse your body with warm water from the neck down.  8.  DO NOT shower/wash with your normal soap after using and rinsing off  the CHG Soap.                9.  Pat yourself dry with a clean towel.            10.  Wear clean pajamas.            11.  Place clean sheets on your bed the night of your first shower and do not  sleep with pets. Day of Surgery : Do not apply any lotions/deodorants the morning of surgery.  Please wear clean clothes to the hospital/surgery center.  FAILURE TO FOLLOW THESE INSTRUCTIONS MAY RESULT IN THE CANCELLATION OF YOUR SURGERY PATIENT SIGNATURE_________________________________  NURSE SIGNATURE__________________________________  ________________________________________________________________________  WHAT IS A BLOOD TRANSFUSION? Blood Transfusion Information  A transfusion is the replacement of blood or some of its parts. Blood is made up of multiple cells which provide different functions.  Red blood cells carry oxygen  and are used for blood loss replacement.  White blood cells fight against infection.  Platelets control bleeding.  Plasma helps clot blood.  Other blood products are available for specialized needs, such as hemophilia or other clotting disorders. BEFORE THE TRANSFUSION  Who gives blood for transfusions?   Healthy volunteers who are fully evaluated to make sure their blood is safe. This is blood bank blood. Transfusion therapy is the safest it has ever been in the practice of medicine. Before blood is taken from a donor, a complete history is taken to make sure that person has no history of diseases nor engages in risky social behavior (examples are intravenous drug use or sexual activity with multiple partners). The donor's travel history is screened to minimize risk of transmitting infections, such as malaria. The donated blood is tested for signs of infectious diseases, such as HIV and hepatitis. The blood is then tested to be sure it is compatible with you in order to minimize the chance of a transfusion reaction. If you or a relative donates blood, this is often done in anticipation of surgery and is not appropriate for emergency situations. It takes many days to process the donated blood. RISKS AND COMPLICATIONS Although transfusion therapy is very safe and saves many lives, the main dangers of transfusion include:   Getting an infectious disease.  Developing a transfusion reaction. This is an allergic reaction to something in the blood you were given. Every precaution is taken to prevent this.  The decision to have a blood transfusion has been considered carefully by your caregiver before blood is given. Blood is not given unless the benefits outweigh the risks. AFTER THE TRANSFUSION  Right after receiving a blood transfusion, you will usually feel much better and more energetic. This is especially true if your red blood cells have gotten low (anemic). The transfusion raises the level of  the red blood cells which carry oxygen, and this usually causes an energy increase.  The nurse administering the transfusion will monitor you carefully for complications. HOME CARE INSTRUCTIONS  No special instructions are needed after a transfusion. You may find your energy is better. Speak with your caregiver about any limitations on activity for underlying diseases you may have. SEEK MEDICAL CARE IF:   Your condition is not improving after your transfusion.  You develop redness or irritation at the intravenous (IV) site. SEEK IMMEDIATE MEDICAL CARE IF:  Any of the following symptoms occur over the next 12 hours:  Shaking chills.  You have a temperature by mouth above 102 F (38.9 C), not controlled by medicine.  Chest, back, or muscle pain.  People around you feel you are not acting correctly or are confused.  Shortness of breath or difficulty breathing.  Dizziness and fainting.  You get a rash or develop hives.  You have a decrease in urine output.  Your urine turns a dark color or changes to pink, red, or brown. Any of the following symptoms occur over the next 10 days:  You have a temperature by mouth above 102 F (38.9 C), not controlled by medicine.  Shortness of breath.  Weakness after normal activity.  The white part of the eye turns yellow (jaundice).  You have a decrease in the amount of urine or are urinating less often.  Your urine turns a dark color or changes to pink, red, or brown. Document Released: 09/04/2000 Document Revised: 11/30/2011 Document Reviewed: 04/23/2008 ExitCare Patient Information 2014 [place].  _______________________________________________________________________  Incentive Spirometer  An incentive spirometer is a tool that can help keep your lungs clear and active. This tool measures how well you are filling your lungs with each breath. Taking long deep breaths may help reverse or decrease the chance of developing  breathing (pulmonary) problems (especially infection) following:  A long period of time when you are unable to move or be active. BEFORE THE PROCEDURE   If the spirometer includes an indicator to show your best effort, your nurse or respiratory therapist will set it to a desired goal.  If possible, sit up straight or lean slightly forward. Try not to slouch.  Hold the incentive spirometer in an upright position. INSTRUCTIONS FOR USE  1. Sit on the edge of your bed if possible, or sit up as far as you can in bed or on a chair. 2. Hold the incentive spirometer in an upright position. 3. Breathe out normally. 4. Place the mouthpiece in your mouth and seal your lips tightly around it. 5. Breathe in slowly and as deeply as possible, raising the piston or the ball toward the top of the column. 6. Hold your breath for 3-5 seconds or for as long as possible. Allow the piston or ball to fall to the bottom of the column. 7. Remove the mouthpiece from your mouth and breathe out normally. 8. Rest for a few seconds and repeat Steps 1 through 7 at least 10 times every 1-2 hours when you are awake. Take your time and take a  few normal breaths between deep breaths. 9. The spirometer may include an indicator to show your best effort. Use the indicator as a goal to work toward during each repetition. 10. After each set of 10 deep breaths, practice coughing to be sure your lungs are clear. If you have an incision (the cut made at the time of surgery), support your incision when coughing by placing a pillow or rolled up towels firmly against it. Once you are able to get out of bed, walk around indoors and cough well. You may stop using the incentive spirometer when instructed by your caregiver.  RISKS AND COMPLICATIONS  Take your time so you do not get dizzy or light-headed.  If you are in pain, you may need to take or ask for pain medication before doing incentive spirometry. It is harder to take a deep  breath if you are having pain. AFTER USE  Rest and breathe slowly and easily.  It can be helpful to keep track of a log of your progress. Your caregiver can provide you with a simple table to help with this. If you are using the spirometer at home, follow these instructions: [place] IF:   You are having difficultly using the spirometer.  You have trouble using the spirometer as often as instructed.  Your pain medication is not giving enough relief while using the spirometer.  You develop fever of 100.5 F (38.1 C) or higher. SEEK IMMEDIATE MEDICAL CARE IF:   You cough up bloody sputum that had not been present before.  You develop fever of 102 F (38.9 C) or greater.  You develop worsening pain at or near the incision site. MAKE SURE YOU:   Understand these instructions.  Will watch your condition.  Will get help right away if you are not doing well or get worse. Document Released: 01/18/2007 Document Revised: 11/30/2011 Document Reviewed: 03/21/2007 [facility] Patient Information 2014 [place], [place].   ________________________________________________________________________

## 2014-02-01 NOTE — Progress Notes (Signed)
02/01/14 5732  OBSTRUCTIVE SLEEP APNEA  Have you ever been diagnosed with sleep apnea through a sleep study? Yes (15 yrs ago)  If yes, do you have and use a CPAP or BPAP machine every night? 0 (states had a cone 15 yrs ago/  used machine x 1 week)  Do you snore loudly (loud enough to be heard through closed doors)?  0  Do you often feel tired, fatigued, or sleepy during the daytime? 1  Has anyone observed you stop breathing during your sleep? 1  Do you have, or are you being treated for high blood pressure? 0  BMI more than 35 kg/m2? 1  Age over 24 years old? 1  Neck circumference greater than 40 cm/16 inches? 1  Gender: 1  Obstructive Sleep Apnea Score 6  Score 4 or greater  Results sent to PCP

## 2014-02-06 NOTE — Progress Notes (Signed)
Instructed patient to arrive short stay tomorrow at 0900am- stressed no food or fluids after midnight tonight-  Verbalized understanding

## 2014-02-07 ENCOUNTER — Encounter (HOSPITAL_COMMUNITY): Payer: Self-pay | Admitting: Anesthesiology

## 2014-02-07 ENCOUNTER — Ambulatory Visit (HOSPITAL_COMMUNITY): Payer: PRIVATE HEALTH INSURANCE

## 2014-02-07 ENCOUNTER — Inpatient Hospital Stay (HOSPITAL_COMMUNITY)
Admission: RE | Admit: 2014-02-07 | Discharge: 2014-02-08 | DRG: 519 | Disposition: A | Discharge status: Home / Self Care | Payer: PRIVATE HEALTH INSURANCE | Source: Ambulatory Visit | Attending: Specialist | Admitting: Specialist

## 2014-02-07 ENCOUNTER — Encounter (HOSPITAL_COMMUNITY)
Admission: RE | Disposition: A | Discharge status: Home / Self Care | Payer: Self-pay | Source: Ambulatory Visit | Attending: Specialist

## 2014-02-07 ENCOUNTER — Ambulatory Visit (HOSPITAL_COMMUNITY): Payer: PRIVATE HEALTH INSURANCE | Admitting: Anesthesiology

## 2014-02-07 ENCOUNTER — Encounter (HOSPITAL_COMMUNITY): Payer: PRIVATE HEALTH INSURANCE | Admitting: Anesthesiology

## 2014-02-07 DIAGNOSIS — D1779 Benign lipomatous neoplasm of other sites: Secondary | ICD-10-CM | POA: Diagnosis present

## 2014-02-07 DIAGNOSIS — J449 Chronic obstructive pulmonary disease, unspecified: Secondary | ICD-10-CM | POA: Diagnosis present

## 2014-02-07 DIAGNOSIS — J4489 Other specified chronic obstructive pulmonary disease: Secondary | ICD-10-CM | POA: Diagnosis present

## 2014-02-07 DIAGNOSIS — F17200 Nicotine dependence, unspecified, uncomplicated: Secondary | ICD-10-CM | POA: Diagnosis present

## 2014-02-07 DIAGNOSIS — R209 Unspecified disturbances of skin sensation: Secondary | ICD-10-CM | POA: Diagnosis present

## 2014-02-07 DIAGNOSIS — E785 Hyperlipidemia, unspecified: Secondary | ICD-10-CM | POA: Diagnosis present

## 2014-02-07 DIAGNOSIS — M48062 Spinal stenosis, lumbar region with neurogenic claudication: Principal | ICD-10-CM | POA: Diagnosis present

## 2014-02-07 DIAGNOSIS — G4730 Sleep apnea, unspecified: Secondary | ICD-10-CM | POA: Diagnosis present

## 2014-02-07 DIAGNOSIS — K219 Gastro-esophageal reflux disease without esophagitis: Secondary | ICD-10-CM | POA: Diagnosis present

## 2014-02-07 DIAGNOSIS — I10 Essential (primary) hypertension: Secondary | ICD-10-CM | POA: Diagnosis present

## 2014-02-07 DIAGNOSIS — Z6841 Body Mass Index (BMI) 40.0 and over, adult: Secondary | ICD-10-CM

## 2014-02-07 DIAGNOSIS — I2510 Atherosclerotic heart disease of native coronary artery without angina pectoris: Secondary | ICD-10-CM | POA: Diagnosis present

## 2014-02-07 HISTORY — PX: LUMBAR LAMINECTOMY/DECOMPRESSION MICRODISCECTOMY: SHX5026

## 2014-02-07 LAB — TYPE AND SCREEN
ABO/RH(D): O POS
ANTIBODY SCREEN: NEGATIVE

## 2014-02-07 SURGERY — LUMBAR LAMINECTOMY/DECOMPRESSION MICRODISCECTOMY 2 LEVELS
Anesthesia: General | Site: Back

## 2014-02-07 MED ORDER — THROMBIN 5000 UNITS EX SOLR
OROMUCOSAL | Status: DC | PRN
  Administered 2014-02-07: 11:00:00 via TOPICAL

## 2014-02-07 MED ORDER — HYDROMORPHONE HCL PF 2 MG/ML IJ SOLN
INTRAMUSCULAR | Status: AC
  Filled 2014-02-07: qty 1

## 2014-02-07 MED ORDER — LACTATED RINGERS IV SOLN
INTRAVENOUS | Status: DC
  Administered 2014-02-07: 1000 mL via INTRAVENOUS
  Administered 2014-02-07: 14:00:00 via INTRAVENOUS

## 2014-02-07 MED ORDER — SUCCINYLCHOLINE CHLORIDE 20 MG/ML IJ SOLN
INTRAMUSCULAR | Status: DC | PRN
  Administered 2014-02-07: 100 mg via INTRAVENOUS

## 2014-02-07 MED ORDER — GLYCOPYRROLATE 0.2 MG/ML IJ SOLN
INTRAMUSCULAR | Status: AC
  Filled 2014-02-07: qty 1

## 2014-02-07 MED ORDER — BUPIVACAINE-EPINEPHRINE (PF) 0.5% -1:200000 IJ SOLN
INTRAMUSCULAR | Status: DC | PRN
  Administered 2014-02-07: 15 mL

## 2014-02-07 MED ORDER — DEXAMETHASONE SODIUM PHOSPHATE 10 MG/ML IJ SOLN
INTRAMUSCULAR | Status: DC | PRN
  Administered 2014-02-07: 10 mg via INTRAVENOUS

## 2014-02-07 MED ORDER — HYDROMORPHONE HCL PF 1 MG/ML IJ SOLN
INTRAMUSCULAR | Status: AC
  Filled 2014-02-07: qty 1

## 2014-02-07 MED ORDER — PROPOFOL 10 MG/ML IV BOLUS
INTRAVENOUS | Status: DC | PRN
  Administered 2014-02-07: 200 mg via INTRAVENOUS

## 2014-02-07 MED ORDER — METHOCARBAMOL 500 MG PO TABS: 500.0000 mg | ORAL_TABLET | Freq: Four times a day (QID) | ORAL | Status: DC | PRN

## 2014-02-07 MED ORDER — CEFAZOLIN SODIUM-DEXTROSE 2-3 GM-% IV SOLR
2.0000 g | Freq: Three times a day (TID) | INTRAVENOUS | Status: AC
  Administered 2014-02-07 – 2014-02-08 (×2): 2 g via INTRAVENOUS
  Filled 2014-02-07 (×2): qty 50

## 2014-02-07 MED ORDER — NICOTINE 14 MG/24HR TD PT24
14.0000 mg | MEDICATED_PATCH | Freq: Every day | TRANSDERMAL | Status: DC
  Filled 2014-02-07 (×2): qty 1

## 2014-02-07 MED ORDER — HEMOSTATIC AGENTS (NO CHARGE) OPTIME
TOPICAL | Status: DC | PRN
  Administered 2014-02-07: 1 via TOPICAL

## 2014-02-07 MED ORDER — BUPIVACAINE-EPINEPHRINE (PF) 0.5% -1:200000 IJ SOLN
INTRAMUSCULAR | Status: AC
  Filled 2014-02-07: qty 30

## 2014-02-07 MED ORDER — OXYCODONE-ACETAMINOPHEN 5-325 MG PO TABS: 1.0000 | ORAL_TABLET | ORAL | Status: DC | PRN

## 2014-02-07 MED ORDER — DOCUSATE SODIUM 100 MG PO CAPS
100.0000 mg | ORAL_CAPSULE | Freq: Two times a day (BID) | ORAL | Status: DC
  Administered 2014-02-08: 100 mg via ORAL

## 2014-02-07 MED ORDER — LIDOCAINE HCL (PF) 2 % IJ SOLN
INTRAMUSCULAR | Status: DC | PRN
  Administered 2014-02-07: 30 mg via INTRADERMAL

## 2014-02-07 MED ORDER — SODIUM CHLORIDE 0.9 % IJ SOLN: 3.0000 mL | INTRAMUSCULAR | Status: DC | PRN

## 2014-02-07 MED ORDER — HYDROMORPHONE HCL PF 1 MG/ML IJ SOLN
0.2500 mg | INTRAMUSCULAR | Status: DC | PRN
  Administered 2014-02-07 (×4): 0.5 mg via INTRAVENOUS

## 2014-02-07 MED ORDER — ROCURONIUM BROMIDE 100 MG/10ML IV SOLN
INTRAVENOUS | Status: AC
  Filled 2014-02-07: qty 1

## 2014-02-07 MED ORDER — CEFAZOLIN SODIUM-DEXTROSE 2-3 GM-% IV SOLR
2.0000 g | INTRAVENOUS | Status: AC
  Administered 2014-02-07: 2 g via INTRAVENOUS

## 2014-02-07 MED ORDER — CEFAZOLIN SODIUM-DEXTROSE 2-3 GM-% IV SOLR
INTRAVENOUS | Status: AC
  Filled 2014-02-07: qty 50

## 2014-02-07 MED ORDER — ACETAMINOPHEN 650 MG RE SUPP: 650.0000 mg | RECTAL | Status: DC | PRN

## 2014-02-07 MED ORDER — MIDAZOLAM HCL 5 MG/5ML IJ SOLN
INTRAMUSCULAR | Status: DC | PRN
  Administered 2014-02-07: 2 mg via INTRAVENOUS

## 2014-02-07 MED ORDER — PANTOPRAZOLE SODIUM 40 MG PO TBEC
40.0000 mg | DELAYED_RELEASE_TABLET | Freq: Every day | ORAL | Status: DC
  Administered 2014-02-08: 40 mg via ORAL
  Filled 2014-02-07: qty 1

## 2014-02-07 MED ORDER — PROMETHAZINE HCL 25 MG/ML IJ SOLN: 6.2500 mg | INTRAMUSCULAR | Status: DC | PRN

## 2014-02-07 MED ORDER — DOCUSATE SODIUM 100 MG PO CAPS: 100.0000 mg | ORAL_CAPSULE | Freq: Two times a day (BID) | ORAL | Status: DC

## 2014-02-07 MED ORDER — SODIUM CHLORIDE 0.9 % IR SOLN
Status: DC | PRN
  Administered 2014-02-07: 11:00:00

## 2014-02-07 MED ORDER — KCL IN DEXTROSE-NACL 20-5-0.45 MEQ/L-%-% IV SOLN
INTRAVENOUS | Status: DC
  Administered 2014-02-07 – 2014-02-08 (×2): via INTRAVENOUS
  Filled 2014-02-07 (×3): qty 1000

## 2014-02-07 MED ORDER — ONDANSETRON HCL 4 MG/2ML IJ SOLN
INTRAMUSCULAR | Status: AC
  Filled 2014-02-07: qty 2

## 2014-02-07 MED ORDER — PHENOL 1.4 % MT LIQD
1.0000 | OROMUCOSAL | Status: DC | PRN
  Filled 2014-02-07: qty 177

## 2014-02-07 MED ORDER — SODIUM CHLORIDE 0.9 % IJ SOLN: 3.0000 mL | Freq: Two times a day (BID) | INTRAMUSCULAR | Status: DC

## 2014-02-07 MED ORDER — FENTANYL CITRATE 0.05 MG/ML IJ SOLN
INTRAMUSCULAR | Status: AC
  Filled 2014-02-07: qty 5

## 2014-02-07 MED ORDER — DEXAMETHASONE SODIUM PHOSPHATE 10 MG/ML IJ SOLN
INTRAMUSCULAR | Status: AC
  Filled 2014-02-07: qty 1

## 2014-02-07 MED ORDER — GLYCOPYRROLATE 0.2 MG/ML IJ SOLN
INTRAMUSCULAR | Status: DC | PRN
  Administered 2014-02-07: 0.2 mg via INTRAVENOUS
  Administered 2014-02-07: 0.4 mg via INTRAVENOUS

## 2014-02-07 MED ORDER — METHOCARBAMOL 1000 MG/10ML IJ SOLN
500.0000 mg | Freq: Four times a day (QID) | INTRAVENOUS | Status: DC | PRN
  Administered 2014-02-07: 500 mg via INTRAVENOUS
  Filled 2014-02-07: qty 5

## 2014-02-07 MED ORDER — ACETAMINOPHEN 325 MG PO TABS: 650.0000 mg | ORAL_TABLET | ORAL | Status: DC | PRN

## 2014-02-07 MED ORDER — ROCURONIUM BROMIDE 100 MG/10ML IV SOLN
INTRAVENOUS | Status: DC | PRN
  Administered 2014-02-07: 30 mg via INTRAVENOUS

## 2014-02-07 MED ORDER — ALBUTEROL SULFATE (2.5 MG/3ML) 0.083% IN NEBU: 3.0000 mL | INHALATION_SOLUTION | Freq: Four times a day (QID) | RESPIRATORY_TRACT | Status: DC | PRN

## 2014-02-07 MED ORDER — MIDAZOLAM HCL 2 MG/2ML IJ SOLN
INTRAMUSCULAR | Status: AC
  Filled 2014-02-07: qty 2

## 2014-02-07 MED ORDER — ONDANSETRON HCL 4 MG/2ML IJ SOLN
INTRAMUSCULAR | Status: DC | PRN
  Administered 2014-02-07: 4 mg via INTRAVENOUS

## 2014-02-07 MED ORDER — MENTHOL 3 MG MT LOZG
1.0000 | LOZENGE | OROMUCOSAL | Status: DC | PRN
  Filled 2014-02-07: qty 9

## 2014-02-07 MED ORDER — HYDROCODONE-ACETAMINOPHEN 5-325 MG PO TABS
1.0000 | ORAL_TABLET | ORAL | Status: DC | PRN
  Administered 2014-02-07: 2 via ORAL
  Filled 2014-02-07: qty 2

## 2014-02-07 MED ORDER — NEOSTIGMINE METHYLSULFATE 10 MG/10ML IV SOLN
INTRAVENOUS | Status: DC | PRN
  Administered 2014-02-07: 3 mg via INTRAVENOUS

## 2014-02-07 MED ORDER — ONDANSETRON HCL 4 MG/2ML IJ SOLN: 4.0000 mg | INTRAMUSCULAR | Status: DC | PRN

## 2014-02-07 MED ORDER — PROPOFOL 10 MG/ML IV BOLUS
INTRAVENOUS | Status: AC
  Filled 2014-02-07: qty 20

## 2014-02-07 MED ORDER — FENTANYL CITRATE 0.05 MG/ML IJ SOLN
INTRAMUSCULAR | Status: DC | PRN
  Administered 2014-02-07 (×2): 50 ug via INTRAVENOUS
  Administered 2014-02-07: 100 ug via INTRAVENOUS
  Administered 2014-02-07: 50 ug via INTRAVENOUS

## 2014-02-07 MED ORDER — THROMBIN 5000 UNITS EX SOLR
CUTANEOUS | Status: AC
  Filled 2014-02-07: qty 10000

## 2014-02-07 MED ORDER — LORAZEPAM 0.5 MG PO TABS
0.5000 mg | ORAL_TABLET | Freq: Four times a day (QID) | ORAL | Status: DC | PRN
  Administered 2014-02-07: 0.5 mg via ORAL
  Filled 2014-02-07: qty 1

## 2014-02-07 SURGICAL SUPPLY — 47 items
BAG SPEC THK2 15X12 ZIP CLS (MISCELLANEOUS) ×1
BAG ZIPLOCK 12X15 (MISCELLANEOUS) ×2 IMPLANT
CLEANER TIP ELECTROSURG 2X2 (MISCELLANEOUS) ×2 IMPLANT
CLOTH 2% CHLOROHEXIDINE 3PK (PERSONAL CARE ITEMS) ×2 IMPLANT
DRAPE MICROSCOPE LEICA (MISCELLANEOUS) ×2 IMPLANT
DRAPE POUCH INSTRU U-SHP 10X18 (DRAPES) ×2 IMPLANT
DRAPE SURG 17X11 SM STRL (DRAPES) ×2 IMPLANT
DRAPE UTILITY XL STRL (DRAPES) ×2 IMPLANT
DRSG AQUACEL AG ADV 3.5X10 (GAUZE/BANDAGES/DRESSINGS) ×1 IMPLANT
DRSG TEGADERM 2-3/8X2-3/4 SM (GAUZE/BANDAGES/DRESSINGS) ×1 IMPLANT
DURAPREP 26ML APPLICATOR (WOUND CARE) ×2 IMPLANT
ELECT REM PT RETURN 9FT ADLT (ELECTROSURGICAL) ×2
ELECTRODE REM PT RTRN 9FT ADLT (ELECTROSURGICAL) ×1 IMPLANT
EVACUATOR 1/8 PVC DRAIN (DRAIN) ×1 IMPLANT
FLOSEAL 10ML (HEMOSTASIS) ×1 IMPLANT
GAUZE SPONGE 2X2 8PLY STRL LF (GAUZE/BANDAGES/DRESSINGS) IMPLANT
GLOVE BIOGEL PI IND STRL 7.5 (GLOVE) ×1 IMPLANT
GLOVE BIOGEL PI INDICATOR 7.5 (GLOVE) ×1
GLOVE SURG SS PI 7.5 STRL IVOR (GLOVE) ×2 IMPLANT
GLOVE SURG SS PI 8.0 STRL IVOR (GLOVE) ×4 IMPLANT
GOWN BRE IMP PREV XXLGXLNG (GOWN DISPOSABLE) ×1 IMPLANT
GOWN STRL REUS W/TWL XL LVL3 (GOWN DISPOSABLE) ×5 IMPLANT
IV CATH 14GX2 1/4 (CATHETERS) ×1 IMPLANT
KIT BASIN OR (CUSTOM PROCEDURE TRAY) ×2 IMPLANT
KIT POSITIONING SURG ANDREWS (MISCELLANEOUS) ×2 IMPLANT
MANIFOLD NEPTUNE II (INSTRUMENTS) ×2 IMPLANT
NDL SPNL 18GX3.5 QUINCKE PK (NEEDLE) ×3 IMPLANT
NEEDLE SPNL 18GX3.5 QUINCKE PK (NEEDLE) ×4 IMPLANT
PATTIES SURGICAL .5 X.5 (GAUZE/BANDAGES/DRESSINGS) IMPLANT
PATTIES SURGICAL .75X.75 (GAUZE/BANDAGES/DRESSINGS) IMPLANT
PATTIES SURGICAL 1X1 (DISPOSABLE) IMPLANT
SPONGE GAUZE 2X2 STER 10/PKG (GAUZE/BANDAGES/DRESSINGS) ×1
SPONGE LAP 4X18 X RAY DECT (DISPOSABLE) ×2 IMPLANT
SPONGE SURGIFOAM ABS GEL 100 (HEMOSTASIS) ×2 IMPLANT
STAPLER VISISTAT (STAPLE) ×1 IMPLANT
SUT PROLENE 3 0 PS 2 (SUTURE) ×1 IMPLANT
SUT VIC AB 1 CT1 27 (SUTURE) ×6
SUT VIC AB 1 CT1 27XBRD ANTBC (SUTURE) IMPLANT
SUT VIC AB 1-0 CT2 27 (SUTURE) IMPLANT
SUT VIC AB 2-0 CT1 27 (SUTURE) ×4
SUT VIC AB 2-0 CT1 TAPERPNT 27 (SUTURE) IMPLANT
SUT VIC AB 2-0 CT2 27 (SUTURE) ×2 IMPLANT
TOWEL OR 17X26 10 PK STRL BLUE (TOWEL DISPOSABLE) ×2 IMPLANT
TOWEL OR NON WOVEN STRL DISP B (DISPOSABLE) ×2 IMPLANT
TRAY FOLEY CATH 16FRSI W/METER (SET/KITS/TRAYS/PACK) ×1 IMPLANT
TRAY LAMINECTOMY (CUSTOM PROCEDURE TRAY) ×2 IMPLANT
YANKAUER SUCT BULB TIP NO VENT (SUCTIONS) ×2 IMPLANT

## 2014-02-07 NOTE — Brief Op Note (Signed)
02/07/2014  1:39 PM  PATIENT:  [name]  58 y.o. male  PRE-OPERATIVE DIAGNOSIS:  SPINAL STENOSIS LUMBAR TWO-LUMBAR FIVE  POST-OPERATIVE DIAGNOSIS:  SPINAL STENOSIS LUMBAR TWO-LUMBAR FIVE  PROCEDURE:  Procedure(s): LUMBAR DECOMPRESSION Lumbar one-Lumbar five (N/A)  SURGEON:  Surgeon(s) and Role:    * [name], MD - Primary  PHYSICIAN ASSISTANT:   ASSISTANTS: Bissell   ANESTHESIA:   general  EBL:  Total I/O In: 1000 [I.V.:1000] Out: 500 [Urine:200; Blood:300]  BLOOD ADMINISTERED:none  DRAINS: (1) Hemovact drain(s) in the 1 with  Suction Open   LOCAL MEDICATIONS USED:  MARCAINE     SPECIMEN:  No Specimen  DISPOSITION OF SPECIMEN:  N/A  COUNTS:  YES  TOURNIQUET:  * No tourniquets in log *  DICTATION: .Other Dictation: Dictation Number [id number]  PLAN OF CARE: Admit to inpatient   PATIENT DISPOSITION:  PACU - hemodynamically stable.   Delay start of Pharmacological VTE agent (>24hrs) due to surgical blood loss or risk of bleeding: yes

## 2014-02-07 NOTE — H&P (View-Only) (Signed)
Daniel Ball is an 58 y.o. male.   Chief Complaint: back and leg pain HPI: Daniel Ball reports >5 years of gradually worsening back and B/L LE pain, weakness and numbness, limiting his ambulatory capacity. Refractory to multiple ESIs, activity modifications, relative rest, HEP, medications.   Past Medical History  Diagnosis Date  . COPD (chronic obstructive pulmonary disease)     No past surgical history on file.  No family history on file. Social History:  reports that he has been smoking Cigarettes.  He has a 135 pack-year smoking history. He has never used smokeless tobacco. He reports that he does not drink alcohol or use illicit drugs.  Allergies: No Known Allergies   (Not in a hospital admission)  No results found for this or any previous visit (from the past 48 hour(s)). No results found.  Review of Systems  Constitutional: Negative.   HENT: Negative.   Eyes: Negative.   Respiratory: Negative.   Cardiovascular: Negative.   Gastrointestinal: Negative.   Genitourinary: Negative.   Musculoskeletal: Positive for back pain and joint pain.  Skin: Negative.   Neurological: Positive for sensory change and focal weakness.  Psychiatric/Behavioral: Negative.     There were no vitals taken for this visit. Physical Exam  Constitutional: He is oriented to person, place, and time. He appears well-developed and well-nourished.  HENT:  Head: Normocephalic and atraumatic.  Eyes: Conjunctivae and EOM are normal. Pupils are equal, round, and reactive to light.  Neck: Normal range of motion. Neck supple.  Cardiovascular: Normal rate and regular rhythm.   Respiratory: Effort normal and breath sounds normal.  GI: Soft. Bowel sounds are normal.  Musculoskeletal:  Lower extremities just have pain in the buttocks and thighs. Slight quadriceps weakness is noted. He is hyporeflexic. There is no Babinski or clonus.  Lumbar spine exam reveals no evidence of soft tissue swelling,  ecchymosis or deformity. The abdomen is soft and nontender. Nontender over the trochanters. No cellulitis or lymphadenopathy.  Good range of motion of the lumbar spine without associated pain. Straight leg raise produces low back and buttock pain bilaterally. He is hyperreflexic. Motor is 5/5. Sensory exam is intact. No Babinski or clonus. The patient has good distal pulses. No DVT. No pain and normal range of motion without instability of the hips, knees and ankles.  Neurological: He is alert and oriented to person, place, and time. He has normal reflexes.  Skin: Skin is warm and dry.  Psychiatric: He has a normal mood and affect.    MRI and CT myelogram demonstrate fairly stenotic lesions at multiple levels.  Assessment/Plan Spinal stenosis Neurogenic claudication secondary to severe spinal stenosis at 2-3, 3-4 and 4-5. Some retrolisthesis at 2-3.  I had an extensive discussion with Daniel Ball concerning his current pathology, relevant anatomy and treatment options. Concerning his current shoulder, we discussed corticosteroid injections, activity modification and possible eventual shoulder arthroplasty, but I don't feel it's at the point where it would require that. In terms of his lumbar spine, we previously discussed lumbar decompression at 2-3, 3-4 and 4-5.  Risks and benefits of this procedure were discussed with the patient including worsening of symptoms, no changes in symptoms, recurrent disc herniation, scar tissue, epidural fibrosis, damage to neurovascular structures, cerebral spinal fluid leak which would require repair or patching, DVT, PE, anesthetic complications, etc. We discussed the perioperative course, the hospitalization, and the need for postoperative rehabilitation and the time estimate for recovery. We also discussed the possibility of future surgery including repeat  decompression, fusion. The patient was provided an illustrated handout which was discussed in  detail. Appropriate anatomic models were used as well.  He will require preoperative clearance by Dr. Alyson Ingles and Dr. Wynonia Lawman. Neutral positioning of the cervical spine. Also, I feel that we have eliminated severe stenosis noted in the cervical spine. I don't feel there is any surgical intervention of the cervical spine at this point in time. Possible selective nerve root block at C4-5 on the left. We will proceed with scheduling of his lumbar decompression. I gave him a note for out of work.  Plan microlumbar decompression L2-3, L3-4, L4-5  Jaclyn M. Bissell PA-C for Dr. Tonita Cong 01/31/2014, 2:13 PM

## 2014-02-07 NOTE — Plan of Care (Signed)
Problem: Consults Goal: Diagnosis - Spinal Surgery Lumbar decompression L2-L5

## 2014-02-07 NOTE — Interval H&P Note (Signed)
History and Physical Interval Note:  02/07/2014 8:13 AM  Daniel Ball  has presented today for surgery, with the diagnosis of SPINAL STENOSIS   The various methods of treatment have been discussed with the patient and family. After consideration of risks, benefits and other options for treatment, the patient has consented to  Procedure(s): LUMBAR DECOMPRESSION L2-L5  ( 3 LEVELS)  (N/A) as a surgical intervention .  The patient's history has been reviewed, patient examined, no change in status, stable for surgery.  I have reviewed the patient's chart and labs.  Questions were answered to the patient's satisfaction.     [name]

## 2014-02-07 NOTE — Anesthesia Postprocedure Evaluation (Signed)
  Anesthesia Post-op Note  Patient: Daniel Ball  Procedure(s) Performed: Procedure(s) (LRB): LUMBAR DECOMPRESSION Lumbar one-Lumbar five (N/A)  Patient Location: PACU  Anesthesia Type: General  Level of Consciousness: awake and alert   Airway and Oxygen Therapy: Patient Spontanous Breathing  Post-op Pain: mild  Post-op Assessment: Post-op Vital signs reviewed, Patient's Cardiovascular Status Stable, Respiratory Function Stable, Patent Airway and No signs of Nausea or vomiting  Last Vitals:  Filed Vitals:   02/07/14 1530  BP: 142/90  Pulse:   Temp: 36.3 C  Resp: 12    Post-op Vital Signs: stable   Complications: No apparent anesthesia complications. Observed for greater than one hour in PACU without evidence of apnea nor excessive sedation. To floor on OSA precautions.

## 2014-02-07 NOTE — Interval H&P Note (Signed)
History and Physical Interval Note:  02/07/2014 8:16 AM  Daniel Ball  has presented today for surgery, with the diagnosis of SPINAL STENOSIS   The various methods of treatment have been discussed with the patient and family. After consideration of risks, benefits and other options for treatment, the patient has consented to  Procedure(s): LUMBAR DECOMPRESSION L2-L5  ( 3 LEVELS)  (N/A) as a surgical intervention .  The patient's history has been reviewed, patient examined, no change in status, stable for surgery.  I have reviewed the patient's chart and labs.  Questions were answered to the patient's satisfaction.     [name]

## 2014-02-07 NOTE — Transfer of Care (Signed)
Immediate Anesthesia Transfer of Care Note  Patient: Daniel Ball  Procedure(s) Performed: Procedure(s) (LRB): LUMBAR DECOMPRESSION Lumbar one-Lumbar five (N/A)  Patient Location: PACU  Anesthesia Type: General  Level of Consciousness: sedated, patient cooperative and responds to stimulation  Airway & Oxygen Therapy: Patient Spontanous Breathing and Patient connected to face mask oxgen  Post-op Assessment: Report given to PACU RN and Post -op Vital signs reviewed and stable  Post vital signs: Reviewed and stable  Complications: No apparent anesthesia complications

## 2014-02-07 NOTE — Interval H&P Note (Signed)
History and Physical Interval Note:  02/07/2014 8:17 AM  Daniel Ball  has presented today for surgery, with the diagnosis of SPINAL STENOSIS   The various methods of treatment have been discussed with the patient and family. After consideration of risks, benefits and other options for treatment, the patient has consented to  Procedure(s): LUMBAR DECOMPRESSION L2-L5  ( 3 LEVELS)  (N/A) as a surgical intervention .  The patient's history has been reviewed, patient examined, no change in status, stable for surgery.  I have reviewed the patient's chart and labs.  Questions were answered to the patient's satisfaction.     [name]

## 2014-02-07 NOTE — Progress Notes (Signed)
PACU note----Dr. Eliseo Squires, anesthes, notified of pt's SAO2 98, CO2 38, pt alert, stable vital signs; OK to transfer pt to floor for continue care; they will continue to monitor CO2 levels

## 2014-02-07 NOTE — Interval H&P Note (Signed)
History and Physical Interval Note:  02/07/2014 10:12 AM  Daniel Ball  has presented today for surgery, with the diagnosis of SPINAL STENOSIS   The various methods of treatment have been discussed with the patient and family. After consideration of risks, benefits and other options for treatment, the patient has consented to  Procedure(s): LUMBAR DECOMPRESSION L2-L5  ( 3 LEVELS)  (N/A) as a surgical intervention .  The patient's history has been reviewed, patient examined, no change in status, stable for surgery.  I have reviewed the patient's chart and labs.  Questions were answered to the patient's satisfaction.     [name]

## 2014-02-07 NOTE — Progress Notes (Signed)
Pt complained of spasm-like pain at catheter site. Requesting to have foley cath removed. Dr. Reather Littler PA, Kennyth Lose, made aware. said it's OK to DC foley. Foley cath discontinued. Pt now reports relief. Will continue to monitor urine output. Vwilliams,rn.

## 2014-02-07 NOTE — Anesthesia Preprocedure Evaluation (Addendum)
Anesthesia Evaluation  Patient identified by MRN, date of birth, ID band Patient awake    Reviewed: Allergy & Precautions, H&P , NPO status , Patient's Chart, lab work & pertinent test results  Airway Mallampati: II TM Distance: >3 FB Neck ROM: Full    Dental  (+) Edentulous Upper, Edentulous Lower   Pulmonary shortness of breath, sleep apnea , COPD COPD inhaler, Current Smoker,  breath sounds clear to auscultation  Pulmonary exam normal       Cardiovascular hypertension, Pt. on medications + CAD negative cardio ROS  Rhythm:Regular Rate:Normal     Neuro/Psych PSYCHIATRIC DISORDERS negative neurological ROS  negative psych ROS   GI/Hepatic negative GI ROS, Neg liver ROS,   Endo/Other  negative endocrine ROSMorbid obesity  Renal/GU negative Renal ROS  negative genitourinary   Musculoskeletal negative musculoskeletal ROS (+)   Abdominal (+) + obese,   Peds negative pediatric ROS (+)  Hematology negative hematology ROS (+)   Anesthesia Other Findings   Reproductive/Obstetrics negative OB ROS                          Anesthesia Physical Anesthesia Plan  ASA: III  Anesthesia Plan: General   Post-op Pain Management:    Induction: Intravenous  Airway Management Planned: Oral ETT  Additional Equipment:   Intra-op Plan:   Post-operative Plan: Extubation in OR  Informed Consent: I have reviewed the patients History and Physical, chart, labs and discussed the procedure including the risks, benefits and alternatives for the proposed anesthesia with the patient or authorized representative who has indicated his/her understanding and acceptance.   Dental advisory given  Plan Discussed with: CRNA  Anesthesia Plan Comments:         Anesthesia Quick Evaluation

## 2014-02-08 ENCOUNTER — Encounter (HOSPITAL_COMMUNITY): Payer: Self-pay | Admitting: Specialist

## 2014-02-08 LAB — BASIC METABOLIC PANEL
BUN: 14 mg/dL (ref 6–23)
CALCIUM: 9.3 mg/dL (ref 8.4–10.5)
CO2: 24 mEq/L (ref 19–32)
Chloride: 99 mEq/L (ref 96–112)
Creatinine, Ser: 0.75 mg/dL (ref 0.50–1.35)
GFR calc Af Amer: >90 mL/min (ref >90)
GLUCOSE: 166 mg/dL — AB (ref 70–99)
POTASSIUM: 4.6 meq/L (ref 3.7–5.3)
SODIUM: 139 meq/L (ref 137–147)

## 2014-02-08 LAB — CBC
HCT: 41.8 % (ref 39.0–52.0)
HEMOGLOBIN: 14.3 g/dL (ref 13.0–17.0)
MCH: 31.2 pg (ref 26.0–34.0)
MCHC: 34.2 g/dL (ref 30.0–36.0)
MCV: 91.1 fL (ref 78.0–100.0)
PLATELETS: 156 10*3/uL (ref 150–400)
RBC: 4.59 MIL/uL (ref 4.22–5.81)
RDW: 13.3 % (ref 11.5–15.5)
WBC: 18.2 10*3/uL — ABNORMAL HIGH (ref 4.0–10.5)

## 2014-02-08 MED ORDER — ASPIRIN 81 MG PO TABS: 81.0000 mg | ORAL_TABLET | Freq: Every day | ORAL | Status: DC

## 2014-02-08 MED ORDER — SODIUM CHLORIDE 0.9 % IV SOLN: 250.0000 mL | INTRAVENOUS | Status: DC

## 2014-02-08 NOTE — Op Note (Signed)
NAMEBRIER, REID NO.:  [id number]  MEDICAL RECORD NO.:  [id number]  LOCATION:  5409                         FACILITY:  [facility]  PHYSICIAN:  Susa Day, M.D.    DATE OF BIRTH:  1955/10/03  DATE OF PROCEDURE:  02/07/2014 DATE OF DISCHARGE:                              OPERATIVE REPORT   PREOPERATIVE DIAGNOSIS:  Spinal stenosis at L2-3, 3-4, 4-5.  POSTOPERATIVE DIAGNOSIS:  Spinal stenosis at L2-3, 3-4, 4-5, L1-2.  PROCEDURE PERFORMED:  Lumbar decompression L1-2, L2-3, L3-4, and L4-5 with bilateral foraminotomies at L5, L4, L3, L2.  Excision of epidural lipoma.  ANESTHESIA:  General.  ASSISTANT:  Cleophas Dunker, PA was used throughout the case for patient positioning, suction, gentle intermittent neural traction.  BRIEF HISTORY:  This is a 58 year old male with neurogenic claudication secondary to spinal stenosis, predominantly a posterior leg, buttock, and thighs.  He had an MRI and a CT myelogram indicating severe stenosis at 2-3, 3-4, and 4-5.  This is an multifactor predominantly, however, due to epidural lipomatosis.  He was noted to have disk degeneration and neural foraminal narrowing.  He had no static neural deficit or radiculopathy, was basically claudicating symptoms.  He had a very short distance where he developed numbness and weakness in the legs that required a sedentary position.  He had been refractory to conservative treatment, was indicated for decompression.  It appeared that the epidural lipomatosis actually extended up into the L1-2 region as well. But not down at L5-S1.  He had no instability in flexion and extension, minimal back pain.  We discussed decompression over multilevel fusion. We discussed risks and benefits including bleeding, infection, damage to neurovascular strictures, DVT, PE, anesthetic complications, residual back pain, need for fusion in the future, anesthetic complications, etc. He underwent preoperative  clearance from his cardiologist and his pulmonologist.  TECHNIQUE:  The patient in supine position after induction.  Technical difficulty was increased due to the patient's elevated BMI and body habitus.  After induction of adequate general anesthesia, 2 g Kefzol, the patient was placed prone on the [place] frame.  All bony prominences well padded.  Foley to gravity.  His neck was in neutral position due to the history of cervical stenosis.  He had his elbow below his shoulder to reduce risk of impingement.  The lumbar region was prepped and draped in usual sterile fashion.  Two 18-gauge spinal needle was utilized to localize the spinous processes of 1 down to 5 and incision was made from the spinous process of 1 to just below 5. Subcutaneous tissue was dissected.  Electrocautery was utilized to achieve hemostasis.  Dorsolumbar fascia identified and divided in line with the skin incision.  Electrocautery was utilized throughout the case.  Marcaine with epinephrine was infiltrated in the paraspinous musculature.  After dividing the dorsolumbar fascia in line with the skin incision, we elevated the paraspinous musculature from L1-L5. Placed self-retaining McCullough retractors deep.  They are fairly deep subcutaneous adipose tissue layer.  We used extra long retractors. Bipolar electrocautery was utilized to achieve hemostasis.  We identified the spinous processes of 2 through 5.  We removed the spinous processes of 2, 3, 4, and partial  L5.  We then began the decompression. We started first at L4-5 hemilaminotomy of the caudad edge of L4 was performed with 2 mm Kerrison centrally removing the neural arch. Ligamentum flavum detached from cephalad edge of L5.  We performed bilateral hemilaminotomy defined as well.  Neuro patties placed beneath the ligamentum of flavum.  We continued our decompression cephalad.  I removed the neural arch of 4 after utilizing a neuro patties and a Woodson  retractor.  Ligamentum flavum was removed from 3-4 as well fairly impressive epidural lipomatosis was noted as well as ligamentum flavum and facet hypertrophy.  At 4-5 and at 3-4 we decompressed the lateral recesses to the medial border of the pedicle.  We performed foraminotomies of 4 bilaterally as well.  Bipolar electrocautery was utilized to achieve hemostasis.  There was an epidural venous plexus noted and an epidural lipomatosis particularly along the left at L4 in a pseudo capsule consistent with lipoma.  This was removed.  We continued the decompression cephalad.  In a similar fashion we removed ligamentum flavum from 3-4 and the neural arch of 3 as well the 2 and then 3-mm Kerrison.  This was performed centrally and then removed ligamentum flavum from 2-3 as well.  Hypertrophic ligamentum flavum was noted here and epidural lipomatosis severely constricting the thecal sac.  This required removal of the neural arch of 2 and ligamentum flavum at 2-3 and removal of the epidural adipose tissue at this level as well.  Did not use neuro patties proximally at 1-2.  This was decompressed with the use of the operating microscope and from both sides of the operating room table decompressed the lateral recesses to the medial border of the pedicle and performing foraminotomies at L2, L3, L4, and L5 bilaterally. We used bone wax.  Thrombin-soaked Gelfoam for hemostasis.  After full decompression was noted to have again fairly severe stenosis at all levels noted preoperatively.  There was no evidence of disk herniation at T3, 3-4, 4-5 either bilaterally and a neuro Woodson retractor was placed freely out the foramen of 5, 4, 3, and 2 bilaterally following decompression and cephalad beneath the neural arch of 1.  Good restoration of the thecal sac.  No CSF leakage or active bleeding was noted.  We obtained a confirmatory radiograph with a Woodson retractor in the foramen of 5 as well as in 1  delineating the full level of decompression planned preoperatively.  We then achieved strict hemostasis.  We utilized intermittent antibiotic irrigation throughout the case and intermittent relaxation on the [facility] retractors.  I then used FloSeal and placed it in the laminotomy defect with a period of 3-5 minutes removing the majority of the FloSeal.  Good control was noted.  We then placed thrombin-soaked Gelfoam in the laminotomy defect and removed the McCullough retractors and copiously irrigated the wound with antibiotic irrigation, placed a Hemovac just beneath the dorsolumbar fascia and brought out through a separate stab wound of the skin.  Repaired the dorsolumbar fascia with #1 Vicryl interrupted figure- of-eight sutures, subcu with multiple layers of 2-0 and skin with staples.  Wound was dry at its completion placed in sterile dressing. Placed the patient supine on hospital bed, extubated without difficulty, and transported to the recovery room in satisfactory condition.  The patient tolerated the procedure well.  No complications.  Blood loss of 300 mL.     Susa Day, M.D.     Geralynn Rile  D:  02/07/2014  T:  02/07/2014  Job:  [id number] 

## 2014-02-08 NOTE — Care Management Note (Signed)
    Page 1 of 1   02/08/2014     10:16:37 AM CARE MANAGEMENT NOTE 02/08/2014  Patient:  Daniel Ball, Daniel Ball   Account Number:  [id number]  Date Initiated:  02/08/2014  Documentation initiated by:  [facility]  Subjective/Objective Assessment:   adm: back and leg pain; LUMBAR DECOMPRESSION L-1-L-5     Action/Plan:   DISCHARGE PLANNING   Anticipated DC Date:  02/08/2014   Anticipated DC Plan:  [place]  CM consult      Choice offered to / List presented to:             Status of service:  Completed, signed off Medicare Important Message given?   (If response is "NO", the following Medicare IM given date fields will be blank) Date Medicare IM given:   Date Additional Medicare IM given:    Discharge Disposition:  HOME/SELF CARE  Per UR Regulation:    If discussed at Long Length of Stay Meetings, dates discussed:    Comments:  02/08/14 09:30 CM reviewed and no HH needs or DME needs. Mariane Masters, BSN, CM [phone].

## 2014-02-08 NOTE — Progress Notes (Signed)
Pt pulled out hemovac. Site noted to bloody drainage saturating gauze dsg at vac site. Will cont to monitor site. Vwilliams,rn.

## 2014-02-08 NOTE — Progress Notes (Signed)
Utilization review completed.  

## 2014-02-08 NOTE — Evaluation (Signed)
Physical Therapy One Time Evaluation Patient Details Name: Daniel Ball MRN: [id number] DOB: 11-Jul-1956 Today's Date: 02/08/2014   History of Present Illness  Pt is a 59 year old male s/p lumbar decompression L1-L5.  Clinical Impression  Patient evaluated by Physical Therapy with no further acute PT needs identified. All education has been completed and the patient has no further questions. Pt up in chair upon arrival and reports being ready to d/c home.  Pt ambulated and performed steps without any difficulty.  Pt educated on back precautions and provided handout. No further follow-up Physial Therapy or equipment needs. PT is signing off. Thank you for this referral.     Follow Up Recommendations No PT follow up    Equipment Recommendations  None recommended by PT    Recommendations for Other Services       Precautions / Restrictions Precautions Precautions: Back Precaution Comments: explained back precautions and provided handout Restrictions Weight Bearing Restrictions: No      Mobility  Bed Mobility               General bed mobility comments: up in recliner on arrival, discussed log roll technique (also shown on handout)  Transfers Overall transfer level: Modified independent                  Ambulation/Gait Ambulation/Gait assistance: Supervision;Modified independent (Device/Increase time) Ambulation Distance (Feet): 400 Feet Assistive device: None Gait Pattern/deviations: WFL(Within Functional Limits)   Gait velocity interpretation: at or above normal speed for age/gender General Gait Details: no difficulty with gait  Stairs Stairs: Yes Stairs assistance: Supervision Stair Management: Alternating pattern;One rail Right Number of Stairs: 4 General stair comments: verbal cues for safety however pt performed well  Wheelchair Mobility    Modified Rankin (Stroke Patients Only)       Balance                                              Pertinent Vitals/Pain Pt reports good pain control, no increase in pain during gait and steps reported    Home Living Family/patient expects to be discharged to:: Private residence Living Arrangements: Spouse/significant other   Type of Home: House Home Access: Stairs to enter Entrance Stairs-Rails: Right Entrance Stairs-Number of Steps: a few Home Layout: One level Home Equipment: None      Prior Function Level of Independence: Independent               Hand Dominance        Extremity/Trunk Assessment               Lower Extremity Assessment: Overall WFL for tasks assessed (denies numbness/tingling)         Communication   Communication: No difficulties  Cognition Arousal/Alertness: Awake/alert Behavior During Therapy: WFL for tasks assessed/performed Overall Cognitive Status: Within Functional Limits for tasks assessed                      General Comments      Exercises        Assessment/Plan    PT Assessment Patent does not need any further PT services  PT Diagnosis     PT Problem List    PT Treatment Interventions     PT Goals (Current goals can be found in the Care Plan section) Acute Rehab PT Goals PT  Goal Formulation: No goals set, d/c therapy    Frequency     Barriers to discharge        Co-evaluation               End of Session   Activity Tolerance: Patient tolerated treatment well Patient left: in chair;with family/visitor present Nurse Communication: Mobility status         Time: 3810-1751 PT Time Calculation (min): 8 min   Charges:   PT Evaluation $Initial PT Evaluation Tier I: 1 Procedure PT Treatments $Gait Training: 8-22 mins   PT G CodesJunius Argyle 02/08/2014, 10:11 AM Carmelia Bake, PT, DPT 02/08/2014 Pager: [phone]

## 2014-02-08 NOTE — Progress Notes (Signed)
RN reviewed discharge instructions with patient and family. All questions answered. Patient stated understanding.   Paperwork and prescriptions given.   NT rolled patient down in wheelchair to family car.

## 2014-02-08 NOTE — Progress Notes (Addendum)
Subjective: 1 Day Post-Op Procedure(s) (LRB): LUMBAR DECOMPRESSION Lumbar one-Lumbar five (N/A) Patient reports pain as mild.  Seen in AM rounds by Dr. Tonita Cong and myself. Doing well, reports minimal incisional pain, no leg pain. He has been up and walking tolerating it well. Foley was painful and removed around 9pm last night, no complaints since. He has been voiding without difficulty. He declined nicotene patch but does feel the ativan helps and he has some of that at home. States his drain fell out in bed last night, bloody drainage did get on his sheets. He is ready to go home today.  Objective: Vital signs in last 24 hours: Temp:  [96.5 F (35.8 C)-98.7 F (37.1 C)] 97.5 F (36.4 C) (05/21 0517) Pulse Rate:  [73-91] 75 (05/21 0517) Resp:  [9-18] 18 (05/21 0517) BP: (117-161)/(71-91) 149/85 mmHg (05/21 0517) SpO2:  [92 %-100 %] 94 % (05/21 0517) Weight:  [109.317 kg (241 lb)] 109.317 kg (241 lb) (05/20 1530)  Intake/Output from previous day: 05/20 0701 - 05/21 0700 In: 3073.3 [P.O.:360; I.V.:2608.3; IV Piggyback:105] Out: 2297 [Urine:3350; Drains:330; Blood:300] Intake/Output this shift: Total I/O In: 326.7 [I.V.:326.7] Out: -    Recent Labs  02/08/14 0439  HGB 14.3    Recent Labs  02/08/14 0439  WBC 18.2*  RBC 4.59  HCT 41.8  PLT 156    Recent Labs  02/08/14 0439  NA 139  K 4.6  CL 99  CO2 24  BUN 14  CREATININE 0.75  GLUCOSE 166*  CALCIUM 9.3   No results found for this basename: LABPT, INR,  in the last 72 hours  Neurologically intact ABD soft Neurovascular intact Sensation intact distally Intact pulses distally Dorsiflexion/Plantar flexion intact Incision: dressing C/D/I and no drainage No cellulitis present Compartment soft no calf pain or sign of DVT  Assessment/Plan: 1 Day Post-Op Procedure(s) (LRB): LUMBAR DECOMPRESSION Lumbar one-Lumbar five (N/A) Advance diet Up with therapy D/C IV fluids Discussed D/C instructions, Lspine  precautions, dressing instructions Send extra aquacel dressing home with pt to change in 1 week Smoking cessation, has ativan at home D/C home today Follow up in office in 2 weeks for staple removal  Daniel Ball 02/08/2014, 9:52 AM

## 2014-02-08 NOTE — Discharge Instructions (Signed)
Walk As Tolerated utilizing back precautions.  No bending, twisting, or lifting.  No driving for 2 weeks.   Aquacel dressing may remain in place for 7 days. May shower with aquacel dressing in place. After 7 days, remove aquacel dressing and place gauze and tape dressing which should be kept clean and dry and changed daily. See Dr. Tonita Cong in office in 14 days. Begin taking aspirin 81mg  per day starting 4 days after your surgery if not allergic to aspirin or on another blood thinner. Walk daily even outside. Use a cane or walker only if necessary. Avoid sitting on soft sofas.  Start multivitamin with iron 2 days post-op

## 2014-02-09 NOTE — Discharge Summary (Signed)
Physician Discharge Summary   Patient ID: Daniel Ball MRN: [id number] DOB/AGE: [birth year] 58 y.o.  Admit date: 02/07/2014 Discharge date: 02/09/2014  Primary Diagnosis:   SPINAL STENOSIS LUMBAR TWO-LUMBAR FIVE  Admission Diagnoses:  Past Medical History  Diagnosis Date  . COPD (chronic obstructive pulmonary disease)   . Shortness of breath   . GERD (gastroesophageal reflux disease)   . Arthritis   . Spinal stenosis   . Coronary artery disease   . Hypertension   . Hyperlipidemia   . Sleep apnea     couldnt tolerate CPAP   Discharge Diagnoses:   Active Problems:   Neurogenic claudication due to lumbar spinal stenosis  Procedure:  Procedure(s) (LRB): LUMBAR DECOMPRESSION Lumbar one-Lumbar five (N/A)   Consults: None  HPI:  see H&P    Laboratory Data: Hospital Outpatient Visit on 02/01/2014  Component Date Value Ref Range Status  . Sodium 02/01/2014 142  137 - 147 mEq/L Final  . Potassium 02/01/2014 5.0  3.7 - 5.3 mEq/L Final  . Chloride 02/01/2014 98  96 - 112 mEq/L Final  . CO2 02/01/2014 31  19 - 32 mEq/L Final  . Glucose, Bld 02/01/2014 104* 70 - 99 mg/dL Final  . BUN 02/01/2014 15  6 - 23 mg/dL Final  . Creatinine, Ser 02/01/2014 0.87  0.50 - 1.35 mg/dL Final  . Calcium 02/01/2014 9.9  8.4 - 10.5 mg/dL Final  . GFR calc non Af Amer 02/01/2014 >90  >90 mL/min Final  . GFR calc Af Amer 02/01/2014 >90  >90 mL/min Final   Comment: (NOTE)                          The eGFR has been calculated using the CKD EPI equation.                          This calculation has not been validated in all clinical situations.                          eGFR's persistently <90 mL/min signify possible Chronic Kidney                          Disease.  . WBC 02/01/2014 12.3* 4.0 - 10.5 K/uL Final  . RBC 02/01/2014 5.39  4.22 - 5.81 MIL/uL Final  . Hemoglobin 02/01/2014 17.0  13.0 - 17.0 g/dL Final  . HCT 02/01/2014 48.9  39.0 - 52.0 % Final  . MCV 02/01/2014 90.7  78.0 - 100.0  fL Final  . MCH 02/01/2014 31.5  26.0 - 34.0 pg Final  . MCHC 02/01/2014 34.8  30.0 - 36.0 g/dL Final  . RDW 02/01/2014 13.3  11.5 - 15.5 % Final  . Platelets 02/01/2014 162  150 - 400 K/uL Final  . ABO/RH(D) 02/01/2014 O POS   Final  . Antibody Screen 02/01/2014 NEG   Final  . Sample Expiration 02/01/2014 02/10/2014   Final  . MRSA, PCR 02/01/2014 NEGATIVE  NEGATIVE Final  . Staphylococcus aureus 02/01/2014 NEGATIVE  NEGATIVE Final   Comment:                                 The Xpert SA Assay (FDA  approved for NASAL specimens                          in patients over 88 years of age),                          is one component of                          a comprehensive surveillance                          program.  Test performance has                          been validated by [employer] for patients greater                          than or equal to 85 year old.                          It is not intended                          to diagnose infection nor to                          guide or monitor treatment.  . ABO/RH(D) 02/01/2014 O POS   Final    Recent Labs  02/08/14 0439  HGB 14.3    Recent Labs  02/08/14 0439  WBC 18.2*  RBC 4.59  HCT 41.8  PLT 156    Recent Labs  02/08/14 0439  NA 139  K 4.6  CL 99  CO2 24  BUN 14  CREATININE 0.75  GLUCOSE 166*  CALCIUM 9.3   No results found for this basename: LABPT, INR,  in the last 72 hours  X-Rays:Dg Chest 2 View  02/01/2014   CLINICAL DATA:  Preop for lumbar decompression  EXAM: CHEST  2 VIEW  COMPARISON:  01/09/2014  FINDINGS: Cardiomediastinal silhouette is stable. No acute infiltrate or pleural effusion. No pulmonary edema. Stable chronic central mild bronchitic changes. Mild degenerative changes thoracic spine.  IMPRESSION: No active cardiopulmonary disease.   Electronically Signed   By: Lahoma Crocker M.D.   On: 02/01/2014 09:21   Dg Lumbar Spine 2-3  Views  02/01/2014   CLINICAL DATA:  Preoperative evaluation for lumbar decompression, stenosis  EXAM: LUMBAR SPINE - 2-3 VIEW  COMPARISON:  10/18/2012  FINDINGS: Five non-rib-bearing lumbar vertebrae.  Diffuse osseous demineralization.  Scattered disc space narrowing and mild endplate spur formation.  Vertebral body heights maintained without fracture or subluxation.  No gross evidence of bone destruction or spondylolysis, exam lacking oblique views.  SI joints symmetric.  Mild atherosclerotic calcifications aorta.  IMPRESSION: Degenerative disc disease changes lumbar spine and osseous demineralization as above.   Electronically Signed   By: Lavonia Dana M.D.   On: 02/01/2014 09:36   Dg Spine Portable 1 View  02/07/2014   CLINICAL DATA:  New intraoperative radiograph. Lumbar levels 2-5. Please label the foramen.  EXAM: PORTABLE SPINE - 1 VIEW  COMPARISON:  02/07/2014 and 02/01/2014  FINDINGS: The superior most probe projects over the pedicle of the L2 vertebral body. The inferior-most prior projects over the L5-S1 foramen.  There is a changes of laminectomy at L2, L3, L4.  IMPRESSION: Labeling of the lumbar spine above.   Electronically Signed   By: Curlene Dolphin M.D.   On: 02/07/2014 13:05   Dg Spine Portable 1 View  02/07/2014   CLINICAL DATA:  Preop for lumbar spine surgery. Labeling lumbar 2 through lumbar 5. Request numbering of the spinous processes.  EXAM: PORTABLE SPINE - 1 VIEW  COMPARISON:  02/07/2014 at 11:12 hr.  FINDINGS: A portable lateral view of the lumbar spine is submitted. The superior most metallic probe projects over the L2 spinous process. The inferior most metallic probe projects over the L5 spinous process.  IMPRESSION: Labeling of the lumbar spine as described above.   Electronically Signed   By: Curlene Dolphin M.D.   On: 02/07/2014 11:49   Dg Spine Portable 1 View  02/07/2014   CLINICAL DATA:  Carry out for a lumbar spine surgery. L2 and L5 were marked.  EXAM: PORTABLE SPINE - 1  VIEW  COMPARISON:  Lumbar spine radiographs preoperative, 02/01/2014  FINDINGS: Single lateral view of the lumbar spine is submitted. Two radiopaque probes are seen posterior to the lumbar spine. The superior most probe is posterior to the L2 spinous process. The inferior-most probe is posterior to the L5 spinous process.  IMPRESSION: The L2 and L5 vertebral body levels are marked.   Electronically Signed   By: Curlene Dolphin M.D.   On: 02/07/2014 11:25    EKG:No orders found for this or any previous visit.   Hospital Course: Patient was admitted to [facility] and taken to the OR and underwent the above state procedure without complications.  Patient tolerated the procedure well and was later transferred to the recovery room and then to the orthopaedic floor for postoperative care.  They were given PO and IV analgesics for pain control following their surgery.  They were given 24 hours of postoperative antibiotics.   PT was consulted postop to assist with mobility and transfers.  The patient was allowed to be WBAT with therapy and was taught back precautions. Discharge planning was consulted to help with postop disposition and equipment needs.  Patient had a good night on the evening of surgery and started to get up OOB with therapy on day one. Patient was seen in rounds and was ready to go home on day one.  They were given discharge instructions and dressing directions.  They were instructed on when to follow up in the office with Dr. Tonita Cong.   Diet: Regular diet Activity:WBAT; Lspine precautions Follow-up:in 14 days Disposition - Home Discharged Condition: good   Discharge Instructions   Call MD / Call 911    Complete by:  As directed   If you experience chest pain or shortness of breath, CALL 911 and be transported to the hospital emergency room.  If you develope a fever above 101 F, pus (white drainage) or increased drainage or redness at the wound, or calf pain, call your surgeon's  office.     Constipation Prevention    Complete by:  As directed   Drink plenty of fluids.  Prune juice may be helpful.  You may use a stool softener, such as Colace (over the counter) 100 mg twice a day.  Use MiraLax (over  the counter) for constipation as needed.     Diet - low sodium heart healthy    Complete by:  As directed      Increase activity slowly as tolerated    Complete by:  As directed             Medication List    STOP taking these medications       cyclobenzaprine 10 MG tablet  Commonly known as:  FLEXERIL     HYDROcodone-acetaminophen 5-325 MG per tablet  Commonly known as:  NORCO/VICODIN     HYDROcodone-acetaminophen 7.5-325 MG per tablet  Commonly known as:  NORCO      TAKE these medications       albuterol 108 (90 BASE) MCG/ACT inhaler  Commonly known as:  PROVENTIL HFA;VENTOLIN HFA  Inhale 2 puffs into the lungs every 6 (six) hours as needed for wheezing or shortness of breath.     aspirin 81 MG tablet  Take 1 tablet (81 mg total) by mouth daily. Resume 4 days post-op     docusate sodium 100 MG capsule  Commonly known as:  COLACE  Take 1 capsule (100 mg total) by mouth 2 (two) times daily.     ibuprofen 200 MG tablet  Commonly known as:  ADVIL,MOTRIN  Take 800 mg by mouth every 6 (six) hours as needed for mild pain or moderate pain.     methocarbamol 500 MG tablet  Commonly known as:  ROBAXIN  Take 1 tablet (500 mg total) by mouth every 6 (six) hours as needed for muscle spasms.     omeprazole 20 MG capsule  Commonly known as:  PRILOSEC  Take 20 mg by mouth daily.     oxyCODONE-acetaminophen 5-325 MG per tablet  Commonly known as:  ROXICET  Take 1-2 tablets by mouth every 4 (four) hours as needed for severe pain.           Follow-up Information   Follow up with BEANE,JEFFREY C, MD In 2 weeks. (For suture removal)    Specialty:  Orthopedic Surgery   Contact information:   [address] [place]  [address] [phone]       Signed: Arlee Muslim, PA-C Orthopaedic Surgery 02/09/2014, 11:28 AM

## 2014-03-22 ENCOUNTER — Other Ambulatory Visit: Payer: Self-pay | Admitting: Orthopedic Surgery

## 2014-03-26 ENCOUNTER — Other Ambulatory Visit: Payer: Self-pay | Admitting: Orthopedic Surgery

## 2014-03-26 NOTE — H&P (Signed)
Daniel Ball is an 58 y.o. male.   Chief Complaint: L shoulder pain HPI: Daniel Ball follows up and reports continues shoulder pain. He is having neck pain. Pain radiating down into his arm. He wants to do something about the pain he is having. He did need to report back to me, but predominantly is here for his shoulder and neck pain today. His myelogram was done previously which shows fairly severe stenosis. We have discussed lumbar decompression in the past and minimizing his comorbidities. He apparently has had clearance from Dr. Alyson Ingles, Dr. Wynonia Lawman, and Dr. Melvyn Novas with pending reports. He is, however, having severe shoulder pain. He was told it was maybe a biceps tendon, neck pain and radicular pain.   Past Medical History  Diagnosis Date  . COPD (chronic obstructive pulmonary disease)   . Shortness of breath   . GERD (gastroesophageal reflux disease)   . Arthritis   . Spinal stenosis   . Coronary artery disease   . Hypertension   . Hyperlipidemia   . Sleep apnea     couldnt tolerate CPAP    Past Surgical History  Procedure Laterality Date  . Eye surgery Left   . Hand surgery Right     thumb  . Lumbar laminectomy/decompression microdiscectomy N/A 02/07/2014    Procedure: LUMBAR DECOMPRESSION Lumbar one-Lumbar five;  Surgeon: [name], MD;  Location: [facility] ORS;  Service: Orthopedics;  Laterality: N/A;    No family history on file. Social History:  reports that he has been smoking Cigarettes.  He has a 135 pack-year smoking history. He has never used smokeless tobacco. He reports that he does not drink alcohol or use illicit drugs.  Allergies: No Known Allergies   (Not in a hospital admission)  No results found for this or any previous visit (from the past 48 hour(s)). No results found.  Review of Systems  Constitutional: Negative.   HENT: Negative.   Eyes: Negative.   Respiratory: Negative.   Cardiovascular: Negative.   Gastrointestinal: Negative.    Genitourinary: Negative.   Musculoskeletal: Positive for joint pain.  Skin: Negative.   Neurological: Negative.   Psychiatric/Behavioral: Negative.     There were no vitals taken for this visit. Physical Exam  Constitutional: He is oriented to person, place, and time. He appears well-developed and well-nourished.  HENT:  Head: Normocephalic and atraumatic.  Eyes: Conjunctivae and EOM are normal. Pupils are equal, round, and reactive to light.  Neck: Normal range of motion. Neck supple.  Cardiovascular: Normal rate and regular rhythm.   Respiratory: Effort normal and breath sounds normal.  GI: Soft. Bowel sounds are normal.  Musculoskeletal:  he has positive impingement sign, positive secondary impingement sign of the shoulder. Nontender over the [facility]. He has some pain within the glenohumeral joint with internal and external rotation.  Neurological: He is alert and oriented to person, place, and time. He has normal reflexes.  Skin: Skin is warm and dry.  Psychiatric: He has a normal mood and affect.    X-rays of the shoulder demonstrates some glenohumeral arthrosis, type 2 acromion, mild AC arthrosis. MRI of his shoulder with rotator cuff arthropathy, moderate glenohumeral arthrosis, labral tearing, fusion of the glenohumeral joint and the subacromial joint.  Assessment/Plan L shoulder impingement, DJD, labral tear  Concerning his shoulder, he has been refractory to conservative treatment. We discussed previously arthroscopic debridement with subacromial decompression, lavage of the glenohumeral joint, debridement of labrum, possible mini open rotator cuff repair, but I doubt it.  He will have to live with symptoms in terms of the glenohumeral joint and may require a total joint in the future. I did discuss residual symptoms related to the arthritis.  I had a long discussion with the patient concerning risks and benefits of shoulder arthroscopy including no changes, worsening in  symptoms. Also the need for manipulation of the extremity, need for open rotator cuff repair. Also discussed infection, DVT, PE, anesthetic complications, etc. Also included was the possibility of requirement for a repeat debridement in the future. Perioperative course was discussed in detail as well as time to recovery. An illustrated handout was provided and discussed in detail.  Plan L shoulder arthroscopy, SAD, labral debridement   BISSELL, JACLYN M. PA-C for Dr. Tonita Cong  03/26/2014, 11:57 AM

## 2014-03-27 ENCOUNTER — Encounter (HOSPITAL_COMMUNITY): Payer: Self-pay | Admitting: Pharmacy Technician

## 2014-04-04 ENCOUNTER — Other Ambulatory Visit (HOSPITAL_COMMUNITY): Payer: Self-pay | Admitting: *Deleted

## 2014-04-04 NOTE — Patient Instructions (Addendum)
Daniel Ball  04/04/2014                           YOUR PROCEDURE IS SCHEDULED ON:04/12/14 at 9:30 am               [place] TO SHORT STAY CENTER                 ARRIVE AT SHORT STAY AT: 7:30 AM               CALL THIS NUMBER IF ANY PROBLEMS THE DAY OF SURGERY :               [phone]                                REMEMBER:   Do not eat food or drink liquids AFTER MIDNIGHT                  Take these medicines the morning of surgery with               A SIPS OF WATER :  Omeprazole / use ALBUTEROL INHALER / MAY TAKE PAIN MED IF NEEDED        Do not wear jewelry, make-up   Do not wear lotions, powders, or perfumes.   Do not shave legs or underarms 12 hrs. before surgery (men may shave face)  Do not bring valuables to the hospital.  Contacts, dentures or bridgework may not be worn into surgery.  Leave suitcase in the car. After surgery it may be brought to your room.  For patients admitted to the hospital more than one night, checkout time is            11:00 AM                                                       ________________________________________________________________________                                                                        [place]  Before surgery, you can play an important role.  Because skin is not sterile, your skin needs to be as free of germs as possible.  You can reduce the number of germs on your skin by washing with CHG (chlorahexidine gluconate) soap before surgery.  CHG is an antiseptic cleaner which kills germs and bonds with the skin to continue killing germs even after washing. Please DO NOT use if you have an allergy to CHG or antibacterial soaps.  If your skin becomes reddened/irritated stop using the CHG and inform your nurse when you arrive at Short Stay. Do not shave (including legs and underarms) for  at least  48 hours prior to the first CHG shower.  You may shave your face. Please follow these instructions carefully:   1.  Shower with CHG Soap the night before surgery and the  morning of Surgery.   2.  If you choose to wash your hair, wash your hair first as usual with your  normal  Shampoo.   3.  After you shampoo, rinse your hair and body thoroughly to remove the  shampoo.                                         4.  Use CHG as you would any other liquid soap.  You can apply chg directly  to the skin and wash . Gently wash with scrungie or clean wascloth    5.  Apply the CHG Soap to your body ONLY FROM THE NECK DOWN.   Do not use on open                           Wound or open sores. Avoid contact with eyes, ears mouth and genitals (private parts).                        Genitals (private parts) with your normal soap.              6.  Wash thoroughly, paying special attention to the area where your surgery  will be performed.   7.  Thoroughly rinse your body with warm water from the neck down.   8.  DO NOT shower/wash with your normal soap after using and rinsing off  the CHG Soap .                9.  Pat yourself dry with a clean towel.             10.  Wear clean pajamas.             11.  Place clean sheets on your bed the night of your first shower and do not  sleep with pets.  Day of Surgery : Do not apply any lotions/deodorants the morning of surgery.  Please wear clean clothes to the hospital/surgery center.  FAILURE TO FOLLOW THESE INSTRUCTIONS MAY RESULT IN THE CANCELLATION OF YOUR SURGERY    PATIENT SIGNATURE_________________________________  ______________________________________________________________________     Daniel Ball  An incentive spirometer is a tool that can help keep your lungs clear and active. This tool measures how well you are filling your lungs with each breath. Taking long deep breaths may help reverse or decrease the chance of  developing breathing (pulmonary) problems (especially infection) following:  A long period of time when you are unable to move or be active. BEFORE THE PROCEDURE   If the spirometer includes an indicator to show your best effort, your nurse or respiratory therapist will set it to a desired goal.  If possible, sit up straight or lean slightly forward. Try not to slouch.  Hold the incentive spirometer in an upright position. INSTRUCTIONS FOR USE  1. Sit on the edge of your bed if possible, or sit up as far as you can in bed or on a chair. 2. Hold the incentive spirometer in an upright position. 3. Breathe out normally. 4. Place the mouthpiece in your mouth and seal your lips  tightly around it. 5. Breathe in slowly and as deeply as possible, raising the piston or the ball toward the top of the column. 6. Hold your breath for 3-5 seconds or for as long as possible. Allow the piston or ball to fall to the bottom of the column. 7. Remove the mouthpiece from your mouth and breathe out normally. 8. Rest for a few seconds and repeat Steps 1 through 7 at least 10 times every 1-2 hours when you are awake. Take your time and take a few normal breaths between deep breaths. 9. The spirometer may include an indicator to show your best effort. Use the indicator as a goal to work toward during each repetition. 10. After each set of 10 deep breaths, practice coughing to be sure your lungs are clear. If you have an incision (the cut made at the time of surgery), support your incision when coughing by placing a pillow or rolled up towels firmly against it. Once you are able to get out of bed, walk around indoors and cough well. You may stop using the incentive spirometer when instructed by your caregiver.  RISKS AND COMPLICATIONS  Take your time so you do not get dizzy or light-headed.  If you are in pain, you may need to take or ask for pain medication before doing incentive spirometry. It is harder to take a  deep breath if you are having pain. AFTER USE  Rest and breathe slowly and easily.  It can be helpful to keep track of a log of your progress. Your caregiver can provide you with a simple table to help with this. If you are using the spirometer at home, follow these instructions: [place] IF:   You are having difficultly using the spirometer.  You have trouble using the spirometer as often as instructed.  Your pain medication is not giving enough relief while using the spirometer.  You develop fever of 100.5 F (38.1 C) or higher. SEEK IMMEDIATE MEDICAL CARE IF:   You cough up bloody sputum that had not been present before.  You develop fever of 102 F (38.9 C) or greater.  You develop worsening pain at or near the incision site. MAKE SURE YOU:   Understand these instructions.  Will watch your condition.  Will get help right away if you are not doing well or get worse. Document Released: 01/18/2007 Document Revised: 11/30/2011 Document Reviewed: 03/21/2007 [facility] Patient Information 2014 [place], [place].   ________________________________________________________________________

## 2014-04-05 ENCOUNTER — Encounter (HOSPITAL_COMMUNITY): Payer: Self-pay

## 2014-04-05 ENCOUNTER — Encounter (HOSPITAL_COMMUNITY)
Admission: RE | Admit: 2014-04-05 | Discharge: 2014-04-05 | Disposition: A | Discharge status: Home / Self Care | Payer: PRIVATE HEALTH INSURANCE | Source: Ambulatory Visit | Attending: Specialist | Admitting: Specialist

## 2014-04-05 DIAGNOSIS — Z01812 Encounter for preprocedural laboratory examination: Secondary | ICD-10-CM | POA: Insufficient documentation

## 2014-04-05 HISTORY — DX: Impingement syndrome of unspecified shoulder: M75.40

## 2014-04-05 LAB — BASIC METABOLIC PANEL
Anion gap: 13 (ref 5–15)
BUN: 12 mg/dL (ref 6–23)
CALCIUM: 9.9 mg/dL (ref 8.4–10.5)
CO2: 27 meq/L (ref 19–32)
CREATININE: 0.95 mg/dL (ref 0.50–1.35)
Chloride: 100 mEq/L (ref 96–112)
GFR calc Af Amer: >90 mL/min (ref >90)
GFR calc non Af Amer: 89 mL/min — ABNORMAL LOW (ref >90)
GLUCOSE: 60 mg/dL — AB (ref 70–99)
Potassium: 4.5 mEq/L (ref 3.7–5.3)
Sodium: 140 mEq/L (ref 137–147)

## 2014-04-05 LAB — CBC
HCT: 45.2 % (ref 39.0–52.0)
Hemoglobin: 15.5 g/dL (ref 13.0–17.0)
MCH: 30.6 pg (ref 26.0–34.0)
MCHC: 34.3 g/dL (ref 30.0–36.0)
MCV: 89.3 fL (ref 78.0–100.0)
Platelets: 153 10*3/uL (ref 150–400)
RBC: 5.06 MIL/uL (ref 4.22–5.81)
RDW: 12.9 % (ref 11.5–15.5)
WBC: 9.1 10*3/uL (ref 4.0–10.5)

## 2014-04-12 ENCOUNTER — Encounter (HOSPITAL_COMMUNITY): Payer: PRIVATE HEALTH INSURANCE | Admitting: *Deleted

## 2014-04-12 ENCOUNTER — Ambulatory Visit (HOSPITAL_COMMUNITY)
Admission: RE | Admit: 2014-04-12 | Discharge: 2014-04-12 | Disposition: A | Discharge status: Home / Self Care | Payer: PRIVATE HEALTH INSURANCE | Source: Ambulatory Visit | Attending: Specialist | Admitting: Specialist

## 2014-04-12 ENCOUNTER — Ambulatory Visit (HOSPITAL_COMMUNITY): Payer: PRIVATE HEALTH INSURANCE | Admitting: *Deleted

## 2014-04-12 ENCOUNTER — Encounter (HOSPITAL_COMMUNITY): Payer: Self-pay

## 2014-04-12 ENCOUNTER — Encounter (HOSPITAL_COMMUNITY)
Admission: RE | Disposition: A | Discharge status: Home / Self Care | Payer: Self-pay | Source: Ambulatory Visit | Attending: Specialist

## 2014-04-12 DIAGNOSIS — M659 Unspecified synovitis and tenosynovitis, unspecified site: Secondary | ICD-10-CM | POA: Insufficient documentation

## 2014-04-12 DIAGNOSIS — J4489 Other specified chronic obstructive pulmonary disease: Secondary | ICD-10-CM | POA: Insufficient documentation

## 2014-04-12 DIAGNOSIS — M24019 Loose body in unspecified shoulder: Secondary | ICD-10-CM | POA: Insufficient documentation

## 2014-04-12 DIAGNOSIS — M719 Bursopathy, unspecified: Principal | ICD-10-CM | POA: Insufficient documentation

## 2014-04-12 DIAGNOSIS — M25819 Other specified joint disorders, unspecified shoulder: Secondary | ICD-10-CM | POA: Insufficient documentation

## 2014-04-12 DIAGNOSIS — J449 Chronic obstructive pulmonary disease, unspecified: Secondary | ICD-10-CM | POA: Insufficient documentation

## 2014-04-12 DIAGNOSIS — I2510 Atherosclerotic heart disease of native coronary artery without angina pectoris: Secondary | ICD-10-CM | POA: Insufficient documentation

## 2014-04-12 DIAGNOSIS — K219 Gastro-esophageal reflux disease without esophagitis: Secondary | ICD-10-CM | POA: Insufficient documentation

## 2014-04-12 DIAGNOSIS — G4730 Sleep apnea, unspecified: Secondary | ICD-10-CM | POA: Insufficient documentation

## 2014-04-12 DIAGNOSIS — E785 Hyperlipidemia, unspecified: Secondary | ICD-10-CM | POA: Insufficient documentation

## 2014-04-12 DIAGNOSIS — M7580 Other shoulder lesions, unspecified shoulder: Secondary | ICD-10-CM

## 2014-04-12 DIAGNOSIS — I10 Essential (primary) hypertension: Secondary | ICD-10-CM | POA: Insufficient documentation

## 2014-04-12 DIAGNOSIS — M24119 Other articular cartilage disorders, unspecified shoulder: Secondary | ICD-10-CM | POA: Insufficient documentation

## 2014-04-12 DIAGNOSIS — F17200 Nicotine dependence, unspecified, uncomplicated: Secondary | ICD-10-CM | POA: Insufficient documentation

## 2014-04-12 DIAGNOSIS — M19019 Primary osteoarthritis, unspecified shoulder: Secondary | ICD-10-CM | POA: Insufficient documentation

## 2014-04-12 DIAGNOSIS — M67919 Unspecified disorder of synovium and tendon, unspecified shoulder: Secondary | ICD-10-CM | POA: Insufficient documentation

## 2014-04-12 HISTORY — PX: SHOULDER ARTHROSCOPY WITH SUBACROMIAL DECOMPRESSION: SHX5684

## 2014-04-12 SURGERY — SHOULDER ARTHROSCOPY WITH SUBACROMIAL DECOMPRESSION
Anesthesia: General | Site: Shoulder | Laterality: Left

## 2014-04-12 MED ORDER — PROMETHAZINE HCL 25 MG/ML IJ SOLN: 6.2500 mg | INTRAMUSCULAR | Status: DC | PRN

## 2014-04-12 MED ORDER — HYDROMORPHONE HCL PF 1 MG/ML IJ SOLN: 0.2500 mg | INTRAMUSCULAR | Status: DC | PRN

## 2014-04-12 MED ORDER — ROCURONIUM BROMIDE 100 MG/10ML IV SOLN
INTRAVENOUS | Status: AC
  Filled 2014-04-12: qty 1

## 2014-04-12 MED ORDER — NEOSTIGMINE METHYLSULFATE 10 MG/10ML IV SOLN
INTRAVENOUS | Status: DC | PRN
  Administered 2014-04-12: 4 mg via INTRAVENOUS

## 2014-04-12 MED ORDER — ACETAMINOPHEN 10 MG/ML IV SOLN
1000.0000 mg | Freq: Once | INTRAVENOUS | Status: AC
  Administered 2014-04-12: 1000 mg via INTRAVENOUS
  Filled 2014-04-12: qty 100

## 2014-04-12 MED ORDER — FENTANYL CITRATE 0.05 MG/ML IJ SOLN
INTRAMUSCULAR | Status: AC
  Filled 2014-04-12: qty 5

## 2014-04-12 MED ORDER — MIDAZOLAM HCL 2 MG/2ML IJ SOLN
INTRAMUSCULAR | Status: AC
  Filled 2014-04-12: qty 2

## 2014-04-12 MED ORDER — NEOSTIGMINE METHYLSULFATE 10 MG/10ML IV SOLN
INTRAVENOUS | Status: AC
  Filled 2014-04-12: qty 1

## 2014-04-12 MED ORDER — EPINEPHRINE HCL 1 MG/ML IJ SOLN
INTRAMUSCULAR | Status: AC
  Filled 2014-04-12: qty 2

## 2014-04-12 MED ORDER — CEFAZOLIN SODIUM-DEXTROSE 2-3 GM-% IV SOLR
2.0000 g | INTRAVENOUS | Status: AC
  Administered 2014-04-12: 2 g via INTRAVENOUS

## 2014-04-12 MED ORDER — SODIUM CHLORIDE 0.9 % IR SOLN
Status: DC | PRN
  Administered 2014-04-12: 6000 mL

## 2014-04-12 MED ORDER — BUPIVACAINE-EPINEPHRINE 0.5% -1:200000 IJ SOLN
INTRAMUSCULAR | Status: DC | PRN
  Administered 2014-04-12: 20 mL

## 2014-04-12 MED ORDER — PROPOFOL 10 MG/ML IV BOLUS
INTRAVENOUS | Status: AC
  Filled 2014-04-12: qty 20

## 2014-04-12 MED ORDER — PROPOFOL 10 MG/ML IV BOLUS
INTRAVENOUS | Status: DC | PRN
  Administered 2014-04-12: 200 mg via INTRAVENOUS

## 2014-04-12 MED ORDER — ESMOLOL HCL 10 MG/ML IV SOLN
INTRAVENOUS | Status: DC | PRN
  Administered 2014-04-12: 30 mg via INTRAVENOUS
  Administered 2014-04-12: 25 mg via INTRAVENOUS

## 2014-04-12 MED ORDER — HYDROMORPHONE HCL PF 2 MG/ML IJ SOLN
INTRAMUSCULAR | Status: AC
Start: 2014-04-12 — End: 2014-04-12
  Filled 2014-04-12: qty 1

## 2014-04-12 MED ORDER — GLYCOPYRROLATE 0.2 MG/ML IJ SOLN
INTRAMUSCULAR | Status: AC
  Filled 2014-04-12: qty 4

## 2014-04-12 MED ORDER — MIDAZOLAM HCL 5 MG/5ML IJ SOLN
INTRAMUSCULAR | Status: DC | PRN
  Administered 2014-04-12: 2 mg via INTRAVENOUS

## 2014-04-12 MED ORDER — OXYCODONE-ACETAMINOPHEN 5-325 MG PO TABS
1.0000 | ORAL_TABLET | ORAL | Status: DC | PRN
  Administered 2014-04-12: 1 via ORAL
  Filled 2014-04-12: qty 1

## 2014-04-12 MED ORDER — ONDANSETRON HCL 4 MG/2ML IJ SOLN
INTRAMUSCULAR | Status: AC
  Filled 2014-04-12: qty 2

## 2014-04-12 MED ORDER — DOCUSATE SODIUM 100 MG PO CAPS: 100.0000 mg | ORAL_CAPSULE | Freq: Two times a day (BID) | ORAL | Status: DC | PRN

## 2014-04-12 MED ORDER — ROCURONIUM BROMIDE 100 MG/10ML IV SOLN
INTRAVENOUS | Status: DC | PRN
  Administered 2014-04-12: 50 mg via INTRAVENOUS

## 2014-04-12 MED ORDER — LACTATED RINGERS IV SOLN
INTRAVENOUS | Status: DC
Start: 2014-04-12 — End: 2014-04-12

## 2014-04-12 MED ORDER — LIDOCAINE HCL (CARDIAC) 20 MG/ML IV SOLN
INTRAVENOUS | Status: DC | PRN
  Administered 2014-04-12: 50 mg via INTRAVENOUS

## 2014-04-12 MED ORDER — FENTANYL CITRATE 0.05 MG/ML IJ SOLN
INTRAMUSCULAR | Status: DC | PRN
  Administered 2014-04-12: 50 ug via INTRAVENOUS
  Administered 2014-04-12: 100 ug via INTRAVENOUS
  Administered 2014-04-12 (×2): 50 ug via INTRAVENOUS

## 2014-04-12 MED ORDER — ALBUTEROL SULFATE (2.5 MG/3ML) 0.083% IN NEBU
2.5000 mg | INHALATION_SOLUTION | Freq: Once | RESPIRATORY_TRACT | Status: AC
  Administered 2014-04-12: 2.5 mg via RESPIRATORY_TRACT

## 2014-04-12 MED ORDER — CEFAZOLIN SODIUM-DEXTROSE 2-3 GM-% IV SOLR
INTRAVENOUS | Status: AC
  Filled 2014-04-12: qty 50

## 2014-04-12 MED ORDER — METHOCARBAMOL 500 MG PO TABS: 500.0000 mg | ORAL_TABLET | Freq: Three times a day (TID) | ORAL | Status: DC

## 2014-04-12 MED ORDER — GLYCOPYRROLATE 0.2 MG/ML IJ SOLN
INTRAMUSCULAR | Status: DC | PRN
  Administered 2014-04-12: 0.2 mg via INTRAVENOUS
  Administered 2014-04-12: .6 mg via INTRAVENOUS

## 2014-04-12 MED ORDER — METHOCARBAMOL 1000 MG/10ML IJ SOLN
500.0000 mg | Freq: Once | INTRAVENOUS | Status: AC
  Administered 2014-04-12: 500 mg via INTRAVENOUS
  Filled 2014-04-12: qty 5

## 2014-04-12 MED ORDER — GLYCOPYRROLATE 0.2 MG/ML IJ SOLN
INTRAMUSCULAR | Status: AC
  Filled 2014-04-12: qty 3

## 2014-04-12 MED ORDER — ALBUTEROL SULFATE (2.5 MG/3ML) 0.083% IN NEBU
INHALATION_SOLUTION | RESPIRATORY_TRACT | Status: AC
  Filled 2014-04-12: qty 3

## 2014-04-12 MED ORDER — EPINEPHRINE HCL 1 MG/ML IJ SOLN
INTRAMUSCULAR | Status: DC | PRN
  Administered 2014-04-12: 1 mg

## 2014-04-12 MED ORDER — OXYCODONE-ACETAMINOPHEN 7.5-325 MG PO TABS: 1.0000 | ORAL_TABLET | ORAL | Status: DC | PRN

## 2014-04-12 MED ORDER — BUPIVACAINE-EPINEPHRINE (PF) 0.5% -1:200000 IJ SOLN
INTRAMUSCULAR | Status: AC
  Filled 2014-04-12: qty 30

## 2014-04-12 MED ORDER — LACTATED RINGERS IV SOLN
INTRAVENOUS | Status: DC
  Administered 2014-04-12: 11:00:00 via INTRAVENOUS
  Administered 2014-04-12: 1000 mL via INTRAVENOUS

## 2014-04-12 MED ORDER — LIDOCAINE HCL (CARDIAC) 20 MG/ML IV SOLN
INTRAVENOUS | Status: AC
  Filled 2014-04-12: qty 5

## 2014-04-12 MED ORDER — SODIUM CHLORIDE 0.9 % IJ SOLN
INTRAMUSCULAR | Status: AC
  Filled 2014-04-12: qty 10

## 2014-04-12 MED ORDER — HYDROMORPHONE HCL PF 1 MG/ML IJ SOLN
INTRAMUSCULAR | Status: DC | PRN
  Administered 2014-04-12 (×4): 0.5 mg via INTRAVENOUS

## 2014-04-12 SURGICAL SUPPLY — 33 items
BLADE 4.2CUDA (BLADE) ×2 IMPLANT
BLADE SURG SZ11 CARB STEEL (BLADE) ×2 IMPLANT
BOOTIES KNEE HIGH SLOAN (MISCELLANEOUS) ×4 IMPLANT
BUR OVAL 4.0 (BURR) ×2 IMPLANT
CANNULA ACUFO 5X76 (CANNULA) ×4 IMPLANT
CLOTH 2% CHLOROHEXIDINE 3PK (PERSONAL CARE ITEMS) ×2 IMPLANT
DRAPE STERI 35X30 U-POUCH (DRAPES) ×2 IMPLANT
DRAPE U-SHAPE 47X51 STRL (DRAPES) ×2 IMPLANT
DRSG AQUACEL AG ADV 3.5X14 (GAUZE/BANDAGES/DRESSINGS) ×4 IMPLANT
DURAPREP 26ML APPLICATOR (WOUND CARE) ×2 IMPLANT
GLOVE BIOGEL PI IND STRL 7.5 (GLOVE) ×1 IMPLANT
GLOVE BIOGEL PI IND STRL 8 (GLOVE) ×1 IMPLANT
GLOVE BIOGEL PI INDICATOR 7.5 (GLOVE) ×1
GLOVE BIOGEL PI INDICATOR 8 (GLOVE) ×1
GLOVE SURG SS PI 7.5 STRL IVOR (GLOVE) ×2 IMPLANT
GLOVE SURG SS PI 8.0 STRL IVOR (GLOVE) ×2 IMPLANT
GOWN STRL REUS W/TWL XL LVL3 (GOWN DISPOSABLE) ×4 IMPLANT
KIT POSITION SHOULDER SCHLEI (MISCELLANEOUS) ×2 IMPLANT
MANIFOLD NEPTUNE II (INSTRUMENTS) ×2 IMPLANT
NDL SPNL 18GX3.5 QUINCKE PK (NEEDLE) ×1 IMPLANT
NEEDLE SPNL 18GX3.5 QUINCKE PK (NEEDLE) ×2 IMPLANT
PACK SHOULDER CUSTOM OPM052 (CUSTOM PROCEDURE TRAY) ×2 IMPLANT
PAD ABD 8X10 STRL (GAUZE/BANDAGES/DRESSINGS) ×1 IMPLANT
POSITIONER SURGICAL ARM (MISCELLANEOUS) ×2 IMPLANT
SET ARTHROSCOPY TUBING (MISCELLANEOUS) ×2
SET ARTHROSCOPY TUBING LN (MISCELLANEOUS) ×1 IMPLANT
SLING ARM IMMOBILIZER LRG (SOFTGOODS) IMPLANT
SLING ARM IMMOBILIZER MED (SOFTGOODS) ×2 IMPLANT
SPONGE GAUZE 4X4 12PLY (GAUZE/BANDAGES/DRESSINGS) ×1 IMPLANT
SUT ETHILON 4 0 PS 2 18 (SUTURE) ×2 IMPLANT
TUBING CONNECTING 10 (TUBING) ×2 IMPLANT
WAND 90 DEG TURBOVAC W/CORD (SURGICAL WAND) ×2 IMPLANT
WAND HAND CNTRL MULTIVAC 90 (MISCELLANEOUS) ×1 IMPLANT

## 2014-04-12 NOTE — Interval H&P Note (Signed)
History and Physical Interval Note:  04/12/2014 7:22 AM  Daniel Ball  has presented today for surgery, with the diagnosis of impingement syndrome, DJD, labral tear  The various methods of treatment have been discussed with the patient and family. After consideration of risks, benefits and other options for treatment, the patient has consented to  Procedure(s): LEFT SHOULDER ARTHROSCOPY WITH SUBACROMIAL DECOMPRESSION AND LABRAL DEBRIDEMENT (Left) as a surgical intervention .  The patient's history has been reviewed, patient examined, no change in status, stable for surgery.  I have reviewed the patient's chart and labs.  Questions were answered to the patient's satisfaction.     Jamicia Haaland C

## 2014-04-12 NOTE — Op Note (Signed)
NAME:  Daniel Ball, Daniel Ball NO.:  [id number]  MEDICAL RECORD NO.:  [id number]  LOCATION:  [facility]                         FACILITY:  [facility]  PHYSICIAN:  Susa Day, M.D.    DATE OF BIRTH:  Oct 22, 1955  DATE OF PROCEDURE:  04/12/2014 DATE OF DISCHARGE:                              OPERATIVE REPORT   PREOPERATIVE DIAGNOSES:  DJD, labral tear, rotator cuff tear, impingement syndrome of the left shoulder.  POSTOPERATIVE DIAGNOSIS:  DJD, labral tear, rotator cuff tear, impingement syndrome of the left shoulder, biceps tear.  PROCEDURE PERFORMED: 1. Left shoulder arthroscopy. 2. Debridement of tear of the labrum. 3. Debridement of tear of the biceps. 4. Extensive debridement synovectomy of glenohumeral joint,     chondroplasty. 5. Subacromial decompression, bursectomy, acromioplasty, and     debridement of partial tear of the rotator cuff.  ANESTHESIA:  General.  ASSISTANT:  Cleophas Dunker, PA, who was utilized to hold the arm in the proper position.  Applied traction and monitored the influent and the effluent from the shoulder arthroscopy.  BRIEF HISTORY:  The patient is 58, persistent shoulder pain, glenoid hemarthrosis, labral tear and some pain prior to conservative treatment, partially from cortisone injection indicated for arthroscopy debridement, possible open repair.  Risk and benefits were discussed including bleeding, infection, damage to neurovascular structures, DVT, PE, anesthetic complications, etc.  TECHNIQUE:  With the patient in supine position, after induction of adequate general anesthesia, 2 g Kefzol, placed in the beach-chair position, left shoulder and upper extremity were prepped and draped in usual sterile fashion.  He actually had good range of motion of the shoulder, but he had diminished abduction forward flexion and external rotation due to the arthritis.  Surgical marker was utilized to delineate the acromion, AC joint, and  coracoid.  A standard posterolateral portal was utilized with incision through the skin only with a #11 blade.  The arm in the 70/30 position and gentle traction applied, we advanced the arthroscopic camera in the glenohumeral joint penetrating atraumatically.  Irrigant was utilized to insufflate the joint at 65 mmHg.  Copious portions of synovial fluid with cartilaginous debris was evacuated prior to that.  Inspection revealed degenerative changes, glenohumeral joint, chondral loose bodies.  Degenerative fraying of the biceps, attached tear of the labrum posteriorly. Synovitis.  I localized anterior portal with the 18-gauge needle just beneath the biceps tendon some what between the coracoid in the anterior and lateral aspect of the acromion.  Fashioned portal with 11 blade, advanced the portal cannula just beneath the biceps tendon into the joint and irrigated the joint, introduced a 3.5 shaver and performed extensive synovectomy.  Chondroplasty of the humeral head and glenoid were debrided the fraying of the biceps tendon, and we debrided the tear posteriorly of the labrum to a stable base.  Subscap was unremarkable. Tearing anteriorly from the labrum was well, we debrided that as well. Evacuated loose bodies.  Copiously lavaged the joint.  I then infiltrated the joint with 10 mL of 4% Marcaine with epinephrine.  I then redirected the camera in subacromial space posteriorly, and I made an anterolateral portal through the skin only, triangulated in the subacromial space.  We irrigated 65  mmHg, it was again used. Hypertrophic bursa was noted.  We performed a bursectomy with 3.5 shaver.  Released CA ligament.  Shaved the anterolateral aspect of the acromion.  The small bursal sided tear of the rotator cuff was noted. There was no tear noted from the inferior surface.  We debrided that to a stable base approximately 10% of the outer tendon was involved.  This was extensively probed.  No  full-thickness tear was noted or significant tear.  Increased space was noted after release and morcellizing of the CA ligament.  Cautery was utilized to achieve hemostasis.  Copiously irrigated the wound.  Inspection of the rest of the cuff was unremarkable.  I removed all instrumentation.  Portals were closed with 4-0 nylon simple sutures.  A 0.25% Marcaine with epinephrine was infiltrated in the joint, placed in a sling, extubated without difficulty, and transported to recovery in satisfactory condition.  The patient tolerated the procedure well.  No complications.  Assistant, Cleophas Dunker, [place].  Minimal blood loss.     Susa Day, M.D.     Geralynn Rile  D:  04/12/2014  T:  04/12/2014  Job:  [id number]

## 2014-04-12 NOTE — Discharge Instructions (Signed)
SHOULDER ARTHROSCOPY POSTOPERATIVE INSTRUCTIONS FOR DR. Tonita Cong  1.  Ice pack on shoulder 3-4 times per day.  2.  May remove bandages in 72 hours and apply band-aids to sutures.  3,  May get out of sling in AM and start gentle pendulum exercises.  You are encouraged       to move the elbow, wrist and hand.  4.  Elevate operative shoulder and elbow on pillow.  5.  Exercise your fingers to help reduce swelling.  6.  Report to your doctor should any of the following situations occur:   -Swelling of your fingers.  -Inability to wiggle your fingers.  -Coldness, turning pale or blueness of your fingers.  -Loss of sensation, numbness or tingling of your fingers.  -Unusual small or odor from under dressing.  -Excessive bleeding or drainage from the surgical site(s).  -Severe pain which is not relieved by the pain medication your doctor prescribed                for you.  7.  Call the office to schedule and appointment for 14 days . 8.  Take one aspirin per day 325mg  with a meal if not on another blood thinner or allergic.  Patient Signature:  ________________________________________________________  Nurse's Signature:  ________________________________________________________

## 2014-04-12 NOTE — Anesthesia Postprocedure Evaluation (Signed)
Anesthesia Post Note  Patient: Daniel Ball  Procedure(s) Performed: Procedure(s) (LRB): LEFT SHOULDER ARTHROSCOPY WITH SUBACROMIAL DECOMPRESSION AND LABRAL DEBRIDEMENT, ROTATOR CUFF DEBRIDEMENT, BICEPS DEBRIDEMENT (Left)  Anesthesia type: General  Patient location: PACU  Post pain: Pain level controlled  Post assessment: Post-op Vital signs reviewed  Last Vitals:  Filed Vitals:   04/12/14 1305  BP: 149/82  Pulse: 60  Temp: 36.4 C  Resp: 15    Post vital signs: Reviewed  Level of consciousness: sedated  Complications: No apparent anesthesia complications

## 2014-04-12 NOTE — Anesthesia Preprocedure Evaluation (Signed)
Anesthesia Evaluation  Patient identified by MRN, date of birth, ID band Patient awake    Reviewed: Allergy & Precautions, H&P , NPO status , Patient's Chart, lab work & pertinent test results  Airway Mallampati: II TM Distance: >3 FB Neck ROM: Full    Dental  (+) Edentulous Upper, Edentulous Lower   Pulmonary shortness of breath, sleep apnea , COPD COPD inhaler, Current Smoker (2 1/2 ppk per day),  - rhonchi  + wheezing      Cardiovascular hypertension, Pt. on medications + CAD negative cardio ROS  Rhythm:Regular Rate:Normal     Neuro/Psych PSYCHIATRIC DISORDERS negative neurological ROS  negative psych ROS   GI/Hepatic negative GI ROS, Neg liver ROS,   Endo/Other  negative endocrine ROSMorbid obesity  Renal/GU negative Renal ROS  negative genitourinary   Musculoskeletal negative musculoskeletal ROS (+)   Abdominal (+) + obese,   Peds negative pediatric ROS (+)  Hematology negative hematology ROS (+)   Anesthesia Other Findings   Reproductive/Obstetrics negative OB ROS                           Anesthesia Physical Anesthesia Plan  ASA: III  Anesthesia Plan: General   Post-op Pain Management:    Induction: Intravenous  Airway Management Planned: Oral ETT  Additional Equipment:   Intra-op Plan:   Post-operative Plan: Extubation in OR  Informed Consent: I have reviewed the patients History and Physical, chart, labs and discussed the procedure including the risks, benefits and alternatives for the proposed anesthesia with the patient or authorized representative who has indicated his/her understanding and acceptance.   Dental advisory given  Plan Discussed with: CRNA  Anesthesia Plan Comments:         Anesthesia Quick Evaluation

## 2014-04-12 NOTE — H&P (View-Only) (Signed)
Daniel Ball is an 58 y.o. male.   Chief Complaint: L shoulder pain HPI: Daniel Ball follows up and reports continues shoulder pain. He is having neck pain. Pain radiating down into his arm. He wants to do something about the pain he is having. He did need to report back to me, but predominantly is here for his shoulder and neck pain today. His myelogram was done previously which shows fairly severe stenosis. We have discussed lumbar decompression in the past and minimizing his comorbidities. He apparently has had clearance from Dr. Alyson Ingles, Dr. Wynonia Lawman, and Dr. Melvyn Novas with pending reports. He is, however, having severe shoulder pain. He was told it was maybe a biceps tendon, neck pain and radicular pain.   Past Medical History  Diagnosis Date  . COPD (chronic obstructive pulmonary disease)   . Shortness of breath   . GERD (gastroesophageal reflux disease)   . Arthritis   . Spinal stenosis   . Coronary artery disease   . Hypertension   . Hyperlipidemia   . Sleep apnea     couldnt tolerate CPAP    Past Surgical History  Procedure Laterality Date  . Eye surgery Left   . Hand surgery Right     thumb  . Lumbar laminectomy/decompression microdiscectomy N/A 02/07/2014    Procedure: LUMBAR DECOMPRESSION Lumbar one-Lumbar five;  Surgeon: [name], MD;  Location: [facility];  Service: Orthopedics;  Laterality: N/A;    No family history on file. Social History:  reports that he has been smoking Cigarettes.  He has a 135 pack-year smoking history. He has never used smokeless tobacco. He reports that he does not drink alcohol or use illicit drugs.  Allergies: No Known Allergies   (Not in a hospital admission)  No results found for this or any previous visit (from the past 48 hour(s)). No results found.  Review of Systems  Constitutional: Negative.   HENT: Negative.   Eyes: Negative.   Respiratory: Negative.   Cardiovascular: Negative.   Gastrointestinal: Negative.    Genitourinary: Negative.   Musculoskeletal: Positive for joint pain.  Skin: Negative.   Neurological: Negative.   Psychiatric/Behavioral: Negative.     There were no vitals taken for this visit. Physical Exam  Constitutional: He is oriented to person, place, and time. He appears well-developed and well-nourished.  HENT:  Head: Normocephalic and atraumatic.  Eyes: Conjunctivae and EOM are normal. Pupils are equal, round, and reactive to light.  Neck: Normal range of motion. Neck supple.  Cardiovascular: Normal rate and regular rhythm.   Respiratory: Effort normal and breath sounds normal.  GI: Soft. Bowel sounds are normal.  Musculoskeletal:  he has positive impingement sign, positive secondary impingement sign of the shoulder. Nontender over the [facility]. He has some pain within the glenohumeral joint with internal and external rotation.  Neurological: He is alert and oriented to person, place, and time. He has normal reflexes.  Skin: Skin is warm and dry.  Psychiatric: He has a normal mood and affect.    X-rays of the shoulder demonstrates some glenohumeral arthrosis, type 2 acromion, mild AC arthrosis. MRI of his shoulder with rotator cuff arthropathy, moderate glenohumeral arthrosis, labral tearing, fusion of the glenohumeral joint and the subacromial joint.  Assessment/Plan L shoulder impingement, DJD, labral tear  Concerning his shoulder, he has been refractory to conservative treatment. We discussed previously arthroscopic debridement with subacromial decompression, lavage of the glenohumeral joint, debridement of labrum, possible mini open rotator cuff repair, but I doubt it.  He will have to live with symptoms in terms of the glenohumeral joint and may require a total joint in the future. I did discuss residual symptoms related to the arthritis.  I had a long discussion with the patient concerning risks and benefits of shoulder arthroscopy including no changes, worsening in  symptoms. Also the need for manipulation of the extremity, need for open rotator cuff repair. Also discussed infection, DVT, PE, anesthetic complications, etc. Also included was the possibility of requirement for a repeat debridement in the future. Perioperative course was discussed in detail as well as time to recovery. An illustrated handout was provided and discussed in detail.  Plan L shoulder arthroscopy, SAD, labral debridement   Aren Pryde M. PA-C for Dr. Tonita Cong  03/26/2014, 11:57 AM

## 2014-04-12 NOTE — Transfer of Care (Signed)
Immediate Anesthesia Transfer of Care Note  Patient: Daniel Ball  Procedure(s) Performed: Procedure(s): LEFT SHOULDER ARTHROSCOPY WITH SUBACROMIAL DECOMPRESSION AND LABRAL DEBRIDEMENT, ROTATOR CUFF DEBRIDEMENT, BICEPS DEBRIDEMENT (Left)  Patient Location: PACU  Anesthesia Type:General  Level of Consciousness: awake, alert  and oriented  Airway & Oxygen Therapy: Patient Spontanous Breathing and Patient connected to face mask oxygen  Post-op Assessment: Report given to PACU RN and Post -op Vital signs reviewed and stable  Post vital signs: Reviewed and stable  Complications: No apparent anesthesia complications

## 2014-04-12 NOTE — Brief Op Note (Signed)
04/12/2014  11:02 AM  PATIENT:  Daniel Ball  58 y.o. male  PRE-OPERATIVE DIAGNOSIS:  impingement syndrome, DJD, labral tear  POST-OPERATIVE DIAGNOSIS:  impingement syndrome, DJD, labral tear  PROCEDURE:  Procedure(s): LEFT SHOULDER ARTHROSCOPY WITH SUBACROMIAL DECOMPRESSION AND LABRAL DEBRIDEMENT, ROTATOR CUFF DEBRIDEMENT, BICEPS DEBRIDEMENT (Left)  SURGEON:  Surgeon(s) and Role:    * [name], MD - Primary  PHYSICIAN ASSISTANT:   ASSISTANTS: [place]   ANESTHESIA:   general  EBL:     BLOOD ADMINISTERED:none  DRAINS: none   LOCAL MEDICATIONS USED:  MARCAINE     SPECIMEN:  No Specimen  DISPOSITION OF SPECIMEN:  N/A  COUNTS:  YES  TOURNIQUET:  * No tourniquets in log *  DICTATION: .Other Dictation: Dictation Number [id number]  PLAN OF CARE: Discharge to home after PACU  PATIENT DISPOSITION:  PACU - hemodynamically stable.   Delay start of Pharmacological VTE agent (>24hrs) due to surgical blood loss or risk of bleeding: no

## 2014-04-16 ENCOUNTER — Encounter (HOSPITAL_COMMUNITY): Payer: Self-pay | Admitting: Specialist

## 2014-05-22 ENCOUNTER — Encounter: Payer: Self-pay | Admitting: Internal Medicine

## 2014-05-22 ENCOUNTER — Ambulatory Visit (INDEPENDENT_AMBULATORY_CARE_PROVIDER_SITE_OTHER)
Admission: RE | Admit: 2014-05-22 | Discharge: 2014-05-22 | Disposition: A | Discharge status: Home / Self Care | Payer: PRIVATE HEALTH INSURANCE | Source: Ambulatory Visit | Attending: Internal Medicine | Admitting: Internal Medicine

## 2014-05-22 ENCOUNTER — Ambulatory Visit (INDEPENDENT_AMBULATORY_CARE_PROVIDER_SITE_OTHER): Payer: PRIVATE HEALTH INSURANCE | Admitting: Internal Medicine

## 2014-05-22 VITALS — BP 164/90 | HR 70 | Temp 98.1°F | Ht 65.0 in | Wt 257.0 lb

## 2014-05-22 DIAGNOSIS — R0989 Other specified symptoms and signs involving the circulatory and respiratory systems: Secondary | ICD-10-CM

## 2014-05-22 DIAGNOSIS — R0609 Other forms of dyspnea: Secondary | ICD-10-CM

## 2014-05-22 DIAGNOSIS — R0600 Dyspnea, unspecified: Secondary | ICD-10-CM

## 2014-05-22 DIAGNOSIS — F17200 Nicotine dependence, unspecified, uncomplicated: Secondary | ICD-10-CM

## 2014-05-22 MED ORDER — BUDESONIDE-FORMOTEROL FUMARATE 160-4.5 MCG/ACT IN AERO: INHALATION_SPRAY | RESPIRATORY_TRACT | Status: DC

## 2014-05-22 NOTE — Assessment & Plan Note (Signed)
>   3 min discussion I reviewed the Flethcher curve with patient that basically indicates  if you quit smoking when your best day FEV1 is still well preserved (as is clearly  the case here)  it is highly unlikely you will progress to severe disease and informed the patient there was no medication on the market that has proven to change the curve or the likelihood of progression.  Therefore stopping smoking and maintaining abstinence is the most important aspect of care, not choice of inhalers or for that matter, doctors.

## 2014-05-22 NOTE — Progress Notes (Signed)
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 Pulmonary Consult    Pt needs pulmonary clearance for back surgery. He states his breathing is doing well and denies any respiratory co's at this time.   each am note some cough/ congestion/ gray brown x maybe a tbsp over the first hour, worst in winter. rec Stop smoking completely Prn saba  Did fine with back surgery and knee arthroscopy per Dr Tonita Cong     05/22/2014 f/u ov/Daniel Ball re: CB with nl baseline spirometry 12/2013 Chief Complaint  Patient presents with  . Acute Visit    Pt c/o increased wheezing for the past 2 months. He also c/o occ cough- prod with minimal green to brown sputum.  He is taking albuterol inhaler 3-4 times per day.   mailbox is 150 slt upgrade stop when gets there not much variability, has never tried pretreating with albuterol before activity Most of coughing is just in am assoc with subj wheezing also without much variability  No obvious day to day or daytime variabilty or assoc chronic cough or cp or chest tightness, subjective wheeze overt sinus or hb symptoms. No unusual exp hx or h/o childhood pna/ asthma or knowledge of premature birth.  Sleeping ok without nocturnal  or early am exacerbation  of respiratory  c/o's or need for noct saba. Also denies any obvious fluctuation of symptoms with weather or environmental changes or other aggravating or alleviating factors except as outlined above   Current Medications, Allergies, Complete Past Medical History, Past Surgical History, Family History, and Social History were  reviewed in [employer] record.  ROS  The following are not active complaints unless bolded sore throat, dysphagia, dental problems, itching, sneezing,  nasal congestion or excess/ purulent secretions, ear ache,   fever, chills, sweats, unintended wt loss, pleuritic or exertional cp, hemoptysis,  orthopnea pnd or leg swelling, presyncope, palpitations, heartburn, abdominal pain, anorexia, nausea, vomiting, diarrhea  or change in bowel or urinary habits, change in stools or urine, dysuria,hematuria,  rash, arthralgias, visual complaints, headache, numbness weakness or ataxia or problems with walking or coordination,  change in mood/affect or memory.                             Objective:   Physical Exam  amb hoarse wm nad  05/22/2014          257  Wt Readings from Last 3 Encounters:  01/09/14 244 lb (110.678 kg)  07/07/10 254 lb (115.214 kg)  05/02/10 252 lb (114.306 kg)   baseline 220 / 230 around 2010    HEENT: nl dentition, turbinates, and orophanx. Nl external ear canals without cough reflex   NECK :  without JVD/Nodes/TM/ nl carotid upstrokes bilaterally   LUNGS: no acc muscle use,  p saba rx w/in 4 h no wheeze heard  CV:  RRR  no s3 or murmur or increase in P2, no edema   ABD:  Quite obese but soft and nontender  with nl excursion in the supine position. No bruits or organomegaly, bowel sounds nl  MS:  warm without deformities, calf tenderness, cyanosis or clubbing  SKIN: warm and dry without lesions    NEURO:  alert, approp, no deficits      CXR  05/22/2014 :  COPD. Mild hyperinflation with prominent lung markings. Negative for pneumonia. Negative for heart failure or effusion. No change from the prior study.       Assessment & Plan:

## 2014-05-22 NOTE — Patient Instructions (Addendum)
Symbicort 160 Take 2 puffs first thing in am and then another 2 puffs about 12 hours later - fill it you feel it's helping and reduces your need for albuterol  Change omeprazole (prilosec) Take 30-60 min before first meal of the day   GERD (REFLUX)  is an extremely common cause of respiratory symptoms, many times with no significant heartburn at all.    It can be treated with medication, but also with lifestyle changes including avoidance of late meals, excessive alcohol, smoking cessation, and avoid fatty foods, chocolate, peppermint, colas, red wine, and acidic juices such as orange juice.  NO MINT OR MENTHOL PRODUCTS SO NO COUGH DROPS  USE SUGARLESS CANDY INSTEAD (jolley ranchers or Stover's)  NO OIL BASED VITAMINS - use powdered substitutes.    Please remember to go to the x-ray department downstairs for your tests - we will call you with the results when they are available.    Please schedule a follow up office visit in 6 weeks, call sooner if needed

## 2014-05-22 NOTE — Progress Notes (Signed)
Quick Note:  Spoke with pt and notified of results per Dr. Wert. Pt verbalized understanding and denied any questions.  ______ 

## 2014-05-22 NOTE — Assessment & Plan Note (Addendum)
-   PFT's 10/31/13 wnl except for FEF- 25-75  - 05/22/2014 p extensive coaching HFA effectiveness =    90% - 05/22/2014  Walked RA @ fast pace  x 3 laps @ 185 ft each stopped due to min sob/ no desat   Symptoms are markedly disproportionate to objective findings and not clear this is a lung problem but pt does appear to have difficult airway management issues. DDX of  difficult airways management all start with A and  include Adherence, Ace Inhibitors, Acid Reflux, Active Sinus Disease, Alpha 1 Antitripsin deficiency, Anxiety masquerading as Airways dz,  ABPA,  allergy(esp in young), Aspiration (esp in elderly), Adverse effects of DPI,  Active smokers, plus two Bs  = Bronchiectasis and Beta blocker use..and one C= CHF  Adherence is always the initial "prime suspect" and is a multilayered concern that requires a "trust but verify" approach in every patient - starting with knowing how to use medications, especially inhalers, correctly, keeping up with refills and understanding the fundamental difference between maintenance and prns vs those medications only taken for a very short course and then stopped and not refilled.  The proper method of use, as well as anticipated side effects, of a metered-dose inhaler are discussed and demonstrated to the patient. Improved effectiveness after extensive coaching during this visit to a level of approximately  90% so try symbicort 160 2bid to see what difference if any this makes in his ex tol  ? Acid (or non-acid) GERD > always difficult to exclude as up to 75% of pts in some series report no assoc GI/ Heartburn symptoms> rec     Trial of   acid suppression and diet restrictions/ reviewed and instructions given in writing.   ? Anxiety/depression/ obesity/ deconditioning dx of exclusion but next study should probably be cpst if not satisfied with ex tol as very little evidence to support a lung or cardiac limitation at present

## 2014-07-03 ENCOUNTER — Encounter: Payer: Self-pay | Admitting: Internal Medicine

## 2014-07-03 ENCOUNTER — Ambulatory Visit (INDEPENDENT_AMBULATORY_CARE_PROVIDER_SITE_OTHER): Payer: PRIVATE HEALTH INSURANCE | Admitting: Internal Medicine

## 2014-07-03 VITALS — BP 142/80 | HR 78 | Temp 98.1°F | Ht 64.0 in | Wt 255.0 lb

## 2014-07-03 DIAGNOSIS — R05 Cough: Secondary | ICD-10-CM

## 2014-07-03 DIAGNOSIS — R0600 Dyspnea, unspecified: Secondary | ICD-10-CM

## 2014-07-03 DIAGNOSIS — R059 Cough, unspecified: Secondary | ICD-10-CM | POA: Insufficient documentation

## 2014-07-03 DIAGNOSIS — Z720 Tobacco use: Secondary | ICD-10-CM

## 2014-07-03 DIAGNOSIS — F17200 Nicotine dependence, unspecified, uncomplicated: Secondary | ICD-10-CM

## 2014-07-03 NOTE — Patient Instructions (Addendum)
Add pepcid 20 mg on at bedtime for a month to see if helps your night time wheezing (available over the counter)  Please see patient coordinator before you leave today  to schedule sinus and chest ct and I will call you the result  The biggest problem you have from our perspective is  the smoking and weight control > defer this to Dr Alyson Ingles

## 2014-07-03 NOTE — Progress Notes (Signed)
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 Pulmonary Consult    Pt needs pulmonary clearance for back surgery. He states his breathing is doing well and denies any respiratory co's at this time.   each am note some cough/ congestion/ gray brown x maybe a tbsp over the first hour, worst in winter. rec Stop smoking completely Prn saba  Did fine with back surgery and knee arthroscopy per Dr Tonita Cong     05/22/2014 f/u ov/Daniel Ball re: CB with nl baseline spirometry 12/2013 Chief Complaint  Patient presents with  . Acute Visit    Pt c/o increased wheezing for the past 2 months. He also c/o occ cough- prod with minimal green to brown sputum.  He is taking albuterol inhaler 3-4 times per day.   mailbox is 150 slt upgrade stop when gets there not much variability, has never tried pretreating with albuterol before activity Most of coughing is just in am assoc with subj wheezing also without much variability rec Symbicort 160 Take 2 puffs first thing in am and then another 2 puffs about 12 hours later - fill it you feel it's helping and reduces your need for albuterol Change omeprazole (prilosec) Take 30-60 min before first meal of the day  GERD  Diet     07/03/2014 f/u ov/Daniel Ball re: still smoking/ cb with mostly pseudowheeze Chief Complaint  Patient presents with  . Follow-up    Pt states still has wheezing and cough-mainly at bedtime. Using rescue inhaler 3-4 times per wk.   daily chronic doe some better on symbicort with less need for saba vs baseline  Noct cough is dry and worse when lies down but  also again in am with rattling but min mucus production   No obvious day to day or daytime variabilty or assoc   cp or chest tightness, subjective wheeze overt sinus or hb symptoms. No unusual exp hx or h/o childhood pna/ asthma or knowledge of premature birth.    Also denies any obvious fluctuation of symptoms with weather or environmental changes or other aggravating or alleviating factors except as outlined above   Current Medications, Allergies, Complete Past Medical History, Past Surgical History, Family History, and Social History were reviewed in [employer] record.  ROS  The following are not active complaints unless bolded sore throat, dysphagia, dental problems, itching, sneezing,  nasal congestion or excess/ purulent secretions, ear ache,   fever, chills, sweats, unintended wt loss, pleuritic or exertional cp, hemoptysis,  orthopnea pnd or leg swelling, presyncope, palpitations, heartburn, abdominal pain, anorexia, nausea, vomiting, diarrhea  or change in bowel or urinary habits, change in stools or urine, dysuria,hematuria,  rash, arthralgias, visual complaints, headache, numbness weakness or ataxia or problems with walking or coordination,  change in mood/affect or memory.              Objective:   Physical Exam  amb hoarse wm nad  05/22/2014          257  > 07/03/2014 256  Wt Readings  from Last 3 Encounters:  01/09/14 244 lb (110.678 kg)  07/07/10 254 lb (115.214 kg)  05/02/10 252 lb (114.306 kg)   baseline 220 / 230 around 2010    HEENT: nl dentition, turbinates, and orophanx. Nl external ear canals without cough reflex   NECK :  without JVD/Nodes/TM/ nl carotid upstrokes bilaterally   LUNGS: no acc muscle use,   No saba x 24 h / min wheeze with plm   CV:  RRR  no s3 or murmur or increase in P2, no edema   ABD:  Quite obese but soft and nontender with nl excursion in the supine position. No bruits or organomegaly, bowel sounds nl  MS:   warm without deformities, calf tenderness, cyanosis or clubbing  SKIN: warm and dry without lesions    NEURO:  alert, approp, no deficits      CXR  05/22/2014 :  COPD. Mild hyperinflation with prominent lung markings. Negative for pneumonia. Negative for heart failure or effusion. No change from the prior study.       Assessment & Plan:

## 2014-07-04 ENCOUNTER — Other Ambulatory Visit: Payer: PRIVATE HEALTH INSURANCE

## 2014-07-05 NOTE — Assessment & Plan Note (Signed)
-   PFT's 10/31/13 wnl except for FEF- 25-75  - 05/22/2014 p extensive coaching HFA effectiveness =    90% - 05/22/2014  Walked RA @ fast pace  x 3 laps @ 185 ft each stopped due to min sob/ no desat  - spirometry on symbicort and off saba 07/03/2014 > wnl   I had an extended discussion with the patient today lasting 15 to 20 minutes of a 25 minute visit on the following issues: His ongoing c/o sob despite nl airflow  On symbicort are likely related to obesity/ deconditioning and no further meds needs  See smoking a/p sep discussion

## 2014-07-05 NOTE — Assessment & Plan Note (Signed)
Likely related to smoking and possible noct gerd  rec sinus and chest ct to be complete and trial of pepcid at hs plus diet/ reviewed

## 2014-07-05 NOTE — Assessment & Plan Note (Signed)
I took an extended  opportunity with this patient to outline the consequences of continued cigarette use  in airway disorders based on all the data we have from the multiple national lung health studies (perfomed over decades at millions of dollars in cost)  indicating that smoking cessation, not choice of inhalers or physicians, is the most important aspect of care.   

## 2014-07-06 ENCOUNTER — Ambulatory Visit (INDEPENDENT_AMBULATORY_CARE_PROVIDER_SITE_OTHER)
Admission: RE | Admit: 2014-07-06 | Discharge: 2014-07-06 | Disposition: A | Discharge status: Home / Self Care | Payer: PRIVATE HEALTH INSURANCE | Source: Ambulatory Visit | Attending: Internal Medicine | Admitting: Internal Medicine

## 2014-07-06 ENCOUNTER — Encounter: Payer: Self-pay | Admitting: Internal Medicine

## 2014-07-06 DIAGNOSIS — R0600 Dyspnea, unspecified: Secondary | ICD-10-CM

## 2014-07-06 DIAGNOSIS — R05 Cough: Secondary | ICD-10-CM

## 2014-07-06 DIAGNOSIS — R059 Cough, unspecified: Secondary | ICD-10-CM

## 2014-07-06 MED ORDER — IOHEXOL 350 MG/ML SOLN
80.0000 mL | Freq: Once | INTRAVENOUS | Status: AC | PRN
  Administered 2014-07-06: 80 mL via INTRAVENOUS

## 2016-01-30 ENCOUNTER — Ambulatory Visit (INDEPENDENT_AMBULATORY_CARE_PROVIDER_SITE_OTHER): Payer: BLUE CROSS/BLUE SHIELD | Admitting: Internal Medicine

## 2016-01-30 ENCOUNTER — Other Ambulatory Visit: Payer: Self-pay | Admitting: Internal Medicine

## 2016-01-30 ENCOUNTER — Ambulatory Visit (INDEPENDENT_AMBULATORY_CARE_PROVIDER_SITE_OTHER)
Admission: RE | Admit: 2016-01-30 | Discharge: 2016-01-30 | Disposition: A | Discharge status: Home / Self Care | Payer: BLUE CROSS/BLUE SHIELD | Source: Ambulatory Visit | Attending: Internal Medicine | Admitting: Internal Medicine

## 2016-01-30 ENCOUNTER — Encounter: Payer: Self-pay | Admitting: Internal Medicine

## 2016-01-30 VITALS — BP 150/96 | HR 70 | Ht 64.5 in | Wt 254.4 lb

## 2016-01-30 DIAGNOSIS — Z720 Tobacco use: Secondary | ICD-10-CM

## 2016-01-30 DIAGNOSIS — R0600 Dyspnea, unspecified: Secondary | ICD-10-CM

## 2016-01-30 DIAGNOSIS — J449 Chronic obstructive pulmonary disease, unspecified: Secondary | ICD-10-CM

## 2016-01-30 DIAGNOSIS — F17210 Nicotine dependence, cigarettes, uncomplicated: Secondary | ICD-10-CM

## 2016-01-30 MED ORDER — GLYCOPYRROLATE-FORMOTEROL 9-4.8 MCG/ACT IN AERO: 2.0000 | INHALATION_SPRAY | Freq: Two times a day (BID) | RESPIRATORY_TRACT | Status: DC

## 2016-01-30 NOTE — Progress Notes (Signed)
Quick Note:  Spoke with pt and notified of results per Dr. Wert. Pt verbalized understanding and denied any questions.  ______ 

## 2016-01-30 NOTE — Assessment & Plan Note (Signed)
-   PFT's 10/31/13 wnl except for FEF- 25-75  - 05/22/2014 p extensive coaching HFA effectiveness =    90% - 05/22/2014  Walked RA @ fast pace  x 3 laps @ 185 ft each stopped due to min sob/ no desat  - spirometry on symbicort and off saba 07/03/2014 > wnl  -CTa 07/06/2014 1. No pulmonary emboli. 2. Progressive chronic interstitial lung disease and peribronchial thickening. The patient has a history of asbestos exposure. The appearance is not definitive for asbestosis but could represent asbestosis. Rec:  Redo pfts 10/2014 and stop smoking in meantime > did not do either    rec proceed with hrct - he has insurance issues and going on medicare in the Fall of 2017 so ok to delay but work on stop smoking in meantime.

## 2016-01-30 NOTE — Progress Notes (Signed)
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 Ball Consult    Pt needs Ball clearance for back surgery. He states his breathing is doing well and denies any respiratory co's at this time.   each am note some cough/ congestion/ gray brown x maybe a tbsp over the first hour, worst in winter. rec Stop smoking completely Prn saba  Did fine with back surgery and knee arthroscopy per Dr Daniel Ball     05/22/2014 f/u ov/Daniel Ball re: CB with nl baseline spirometry 12/2013 Chief Complaint  Patient presents with  . Acute Visit    Pt c/o increased wheezing for the past 2 months. He also c/o occ cough- prod with minimal green to brown sputum.  He is taking albuterol inhaler 3-4 times per day.   mailbox is 150 slt upgrade stop when gets there not much variability, has never tried pretreating with albuterol before activity Most of coughing is just in am assoc with subj wheezing also without much variability rec Symbicort 160 Take 2 puffs first thing in am and then another 2 puffs about 12 hours later - fill it you feel it's helping and reduces your need for albuterol Change omeprazole (prilosec) Take 30-60 min before first meal of the day  GERD  Diet     07/03/2014 f/u ov/Daniel Ball re: still smoking/ cb with mostly pseudowheeze Chief Complaint  Patient presents with  . Follow-up    Pt states still has wheezing and cough-mainly at bedtime. Using rescue inhaler 3-4 times per wk.   daily chronic doe some better on symbicort with less need for saba vs baseline  Noct cough is dry and worse when lies down but  also again in am with rattling but min mucus production rec Add pepcid 20 mg on at bedtime for a month to see if helps your night time wheezing (available over the counter) 07/06/14 CTa >  1. No Ball emboli. 2. Progressive chronic interstitial lung disease and peribronchial thickening. The patient has a history of asbestos exposure. The appearance is not definitive for asbestosis but could represent asbestosis.   01/30/2016  f/u ov/Daniel Ball re: AB ? Astbestosis/ still smoking  Chief Complaint  Patient presents with  . Follow-up    Pt c/o increased cough x 6 months. Cough is non prod and mainly bothers him at night "it's a throat clearing cough". He also c/o occ wheezing and decrease in his exercise tolerance. He uses ventolin 2 x daily on average.   some better p saba and only uses about twice daily occ at noct No am flares Walk at superstore at slow pace = MMRC2 = can't walk a nl pace on a flat grade s sob but does fine slow and flat eg walk at [place]   No obvious day to day or daytime variability or assoc excess/ purulent sputum or mucus plugs or hemoptysis or cp or chest tightness, subjective wheeze or overt sinus or hb symptoms. No unusual exp hx or h/o childhood pna/ asthma or knowledge of premature birth.  Sleeping ok without nocturnal  or early am exacerbation  of respiratory  c/o's or need for noct saba. Also denies any obvious fluctuation of symptoms with weather or environmental changes or other aggravating or alleviating factors except as outlined above   Current Medications, Allergies, Complete Past Medical History, Past Surgical History, Family History, and Social History were reviewed in [employer] record.  ROS  The following are not active complaints unless bolded sore throat, dysphagia, dental problems, itching, sneezing,  nasal congestion or excess/ purulent secretions, ear ache,   fever, chills, sweats, unintended wt loss, classically pleuritic or  exertional cp,  orthopnea pnd or leg swelling, presyncope, palpitations, abdominal pain, anorexia, nausea, vomiting, diarrhea  or change in bowel or bladder habits, change in stools or urine, dysuria,hematuria,  rash, arthralgias, visual complaints, headache, numbness, weakness or ataxia or problems with walking or coordination,  change in mood/affect or memory.              Objective:   Physical Exam  amb hoarse wm nad  05/22/2014          257  > 07/03/2014 256 > 01/30/2016   254     01/09/14 244 lb (110.678 kg)  07/07/10 254 lb (115.214 kg)  05/02/10 252 lb (114.306 kg)  baseline 220 / 230 around 2010    HEENT: nl dentition, turbinates, and orophanx. Nl external ear canals without cough reflex   NECK :  without JVD/Nodes/TM/ nl carotid upstrokes bilaterally   LUNGS: no acc muscle use,      CV:  RRR  no s3 or murmur or increase in P2, no edema   ABD:  Quite obese but soft and nontender with nl excursion in the supine position. No bruits or organomegaly, bowel sounds nl  MS:  warm without deformities, calf tenderness, cyanosis or clubbing  SKIN: warm and dry without lesions    NEURO:  alert, approp, no deficits     CXR PA and Lateral:   01/30/2016 :    I personally reviewed images and agree with radiology impression as follows:    Progressive chronic interstitial lung disease. Consider follow-up high-resolution chest CT. No acute findings demonstrated.      Assessment & Plan:

## 2016-01-30 NOTE — Patient Instructions (Addendum)
Plan A = Automatic = Bevespi Take 2 puffs first thing in am and then another 2 puffs about 12 hours later.    Plan B = Backup Only use your albuterol as a rescue medication to be used if you can't catch your breath by resting or doing a relaxed purse lip breathing pattern.  - The less you use it, the better it will work when you need it. - Ok to use the inhaler up to 2 puffs  every 4 hours if you must but call for appointment if use goes up over your usual need - Don't leave home without it !!  (think of it like the spare tire for your car)   Work on inhaler technique:  relax and gently blow all the way out then take a nice smooth deep breath back in, triggering the inhaler at same time you start breathing in.  Hold for up to 5 seconds if you can. Blow out thru nose. Rinse and gargle with water when done  Try pepcid ac 20 mg at bedtime and reflux diet to see if diet better  GERD (REFLUX)  is an extremely common cause of respiratory symptoms just like yours , many times with no obvious heartburn at all.    It can be treated with medication, but also with lifestyle changes including elevation of the head of your bed (ideally with 6 inch  bed blocks),  Smoking cessation, avoidance of late meals, excessive alcohol, and avoid fatty foods, chocolate, peppermint, colas, red wine, and acidic juices such as orange juice.  NO MINT OR MENTHOL PRODUCTS SO NO COUGH DROPS  USE SUGARLESS CANDY INSTEAD (Jolley ranchers or Stover's or Life Savers) or even ice chips will also do - the key is to swallow to prevent all throat clearing. NO OIL BASED VITAMINS - use powdered substitutes.     Please remember to go to the  x-ray department downstairs for your tests - we will call you with the results when they are available.       Please schedule a follow up visit in 3 months but call sooner if needed with pfts  - ok to push back if want to wait until medicare kicks in

## 2016-01-31 NOTE — Assessment & Plan Note (Signed)
01/30/2016  After extensive coaching HFA effectiveness =    90% > try Bevespi 2bid x one month samples  saba response suggests he is developing copd and reasonable to try BEVESPI for possible GROUP B symptoms though given cxr there may be another explanation for his symptoms and will bring him back for pfts to sort out/ low threshold to d/c laba/lama in meantime if not better

## 2016-01-31 NOTE — Assessment & Plan Note (Signed)
Body mass index is 43.01    No results found for: TSH   Contributing to gerd tendency/ doe/reviewed the need and the process to achieve and maintain neg calorie balance > defer f/u primary care including intermittently monitoring thyroid status

## 2016-01-31 NOTE — Assessment & Plan Note (Signed)
Discussed the risks and costs (both direct and indirect)  of smoking relative to the benefits of quitting but patient unwilling to commit at this point to a specific quit date.    Offered to help with quitting by use of chantix or referral to our Quit Smart program when the patient is ready.  

## 2016-03-30 ENCOUNTER — Inpatient Hospital Stay: Admission: RE | Admit: 2016-03-30 | Payer: BLUE CROSS/BLUE SHIELD | Source: Ambulatory Visit

## 2016-04-27 ENCOUNTER — Inpatient Hospital Stay: Admission: RE | Admit: 2016-04-27 | Payer: BLUE CROSS/BLUE SHIELD | Source: Ambulatory Visit

## 2016-05-01 ENCOUNTER — Ambulatory Visit: Payer: BLUE CROSS/BLUE SHIELD | Admitting: Internal Medicine

## 2016-06-02 ENCOUNTER — Other Ambulatory Visit: Payer: BLUE CROSS/BLUE SHIELD

## 2016-06-19 ENCOUNTER — Ambulatory Visit: Payer: BLUE CROSS/BLUE SHIELD | Admitting: Internal Medicine

## 2016-07-20 ENCOUNTER — Encounter: Payer: Self-pay | Admitting: Internal Medicine

## 2016-07-20 ENCOUNTER — Ambulatory Visit (INDEPENDENT_AMBULATORY_CARE_PROVIDER_SITE_OTHER)
Admission: RE | Admit: 2016-07-20 | Discharge: 2016-07-20 | Disposition: A | Discharge status: Home / Self Care | Payer: Medicare HMO | Source: Ambulatory Visit | Attending: Internal Medicine | Admitting: Internal Medicine

## 2016-07-20 DIAGNOSIS — J8489 Other specified interstitial pulmonary diseases: Secondary | ICD-10-CM | POA: Insufficient documentation

## 2016-07-20 DIAGNOSIS — R0602 Shortness of breath: Secondary | ICD-10-CM | POA: Diagnosis not present

## 2016-07-20 DIAGNOSIS — R0600 Dyspnea, unspecified: Secondary | ICD-10-CM

## 2016-07-24 ENCOUNTER — Encounter: Payer: Self-pay | Admitting: Internal Medicine

## 2016-07-24 ENCOUNTER — Ambulatory Visit (INDEPENDENT_AMBULATORY_CARE_PROVIDER_SITE_OTHER): Payer: Medicare HMO | Admitting: Internal Medicine

## 2016-07-24 VITALS — BP 114/80 | HR 79 | Ht 64.0 in | Wt 244.0 lb

## 2016-07-24 DIAGNOSIS — Z77090 Contact with and (suspected) exposure to asbestos: Secondary | ICD-10-CM

## 2016-07-24 DIAGNOSIS — R0600 Dyspnea, unspecified: Secondary | ICD-10-CM

## 2016-07-24 DIAGNOSIS — F17210 Nicotine dependence, cigarettes, uncomplicated: Secondary | ICD-10-CM

## 2016-07-24 DIAGNOSIS — J449 Chronic obstructive pulmonary disease, unspecified: Secondary | ICD-10-CM | POA: Diagnosis not present

## 2016-07-24 LAB — PULMONARY FUNCTION TEST
DL/VA % pred: 89 %
DL/VA: 3.75 ml/min/mmHg/L
DLCO COR % PRED: 67 %
DLCO cor: 16.36 ml/min/mmHg
DLCO unc % pred: 67 %
DLCO unc: 16.31 ml/min/mmHg
FEF 25-75 POST: 2.33 L/s
FEF 25-75 Pre: 1.43 L/sec
FEF2575-%Change-Post: 62 %
FEF2575-%Pred-Post: 101 %
FEF2575-%Pred-Pre: 62 %
FEV1-%Change-Post: 9 %
FEV1-%Pred-Post: 86 %
FEV1-%Pred-Pre: 79 %
FEV1-Post: 2.42 L
FEV1-Pre: 2.21 L
FEV1FVC-%Change-Post: 7 %
FEV1FVC-%PRED-PRE: 97 %
FEV6-%Change-Post: 3 %
FEV6-%PRED-PRE: 84 %
FEV6-%Pred-Post: 88 %
FEV6-Post: 3.09 L
FEV6-Pre: 2.98 L
FEV6FVC-%Change-Post: 1 %
FEV6FVC-%Pred-Post: 106 %
FEV6FVC-%Pred-Pre: 104 %
FVC-%CHANGE-POST: 2 %
FVC-%PRED-POST: 83 %
FVC-%Pred-Pre: 81 %
FVC-POST: 3.1 L
FVC-PRE: 3.03 L
Post FEV1/FVC ratio: 78 %
Post FEV6/FVC ratio: 100 %
Pre FEV1/FVC ratio: 73 %
Pre FEV6/FVC Ratio: 98 %
RV % pred: 116 %
RV: 2.3 L
TLC % PRED: 94 %
TLC: 5.45 L

## 2016-07-24 MED ORDER — GLYCOPYRROLATE-FORMOTEROL 9-4.8 MCG/ACT IN AERO: 2.0000 | INHALATION_SPRAY | Freq: Two times a day (BID) | RESPIRATORY_TRACT | 11 refills | Status: DC

## 2016-07-24 NOTE — Progress Notes (Signed)
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 Pulmonary Consult    Pt needs pulmonary clearance for back surgery. He states his breathing is doing well and denies any respiratory co's at this time.   each am note some cough/ congestion/ gray brown x maybe a tbsp over the first hour, worst in winter. rec Stop smoking completely Prn saba  Did fine with back surgery and knee arthroscopy per Dr Tonita Cong     05/22/2014 f/u ov/Daniel Ball re: CB with nl baseline spirometry 12/2013 Chief Complaint  Patient presents with  . Acute Visit    Pt c/o increased wheezing for the past 2 months. He also c/o occ cough- prod with minimal green to brown sputum.  He is taking albuterol inhaler 3-4 times per day.   mailbox is 150 slt upgrade stop when gets there not much variability, has never tried pretreating with albuterol before activity Most of coughing is just in am assoc with subj wheezing also without much variability rec Symbicort 160 Take 2 puffs first thing in am and then another 2 puffs about 12 hours later - fill it you feel it's helping and reduces your need for albuterol Change omeprazole (prilosec) Take 30-60 min before first meal of the day  GERD  Diet     07/03/2014 f/u ov/Daniel Ball re: still smoking/ cb with mostly pseudowheeze Chief Complaint  Patient presents with  . Follow-up    Pt states still has wheezing and cough-mainly at bedtime. Using rescue inhaler 3-4 times per wk.   daily chronic doe some better on symbicort with less need for saba vs baseline  Noct cough is dry and worse when lies  down but also again in am with rattling but min mucus production rec Add pepcid 20 mg on at bedtime for a month to see if helps your night time wheezing (available over the counter) 07/06/14 CTa >  1. No pulmonary emboli. 2. Progressive chronic interstitial lung disease and peribronchial thickening. The patient has a history of asbestos exposure. The appearance is not definitive for asbestosis but could represent asbestosis.   01/30/2016  f/u ov/Daniel Ball re: AB ? Asbestosis/ still smoking  Chief Complaint  Patient presents with  . Follow-up    Pt c/o increased cough x 6 months. Cough is non prod and mainly bothers him at night "it's a throat clearing cough". He also c/o occ wheezing and decrease in his exercise tolerance. He uses ventolin 2 x daily on average.   some better p saba and only uses about twice daily occ at noct No am flares Walk at superstore at slow pace = MMRC2 = can't walk a nl pace on a flat grade s sob but does fine slow and flat eg walk at [place] A = Automatic = Bevespi Take 2 puffs first thing in am and then another 2 puffs about 12 hours later.  Plan B = Backup Only use your albuterol as a rescue medication Work on inhaler technique:  Try pepcid ac 20 mg  at bedtime and reflux diet to see if diet better GERD diet    07/24/2016  f/u ov/Daniel Ball re: COPD GOLD 0 / improved sob on  bevespi bid and saba biw avg  Chief Complaint  Patient presents with  . Follow-up    Coughing less and his breathing has improved. He uses albuterol inhaler 2 x per wk on average.   treadmill x 10-15 min x 2.5 mph x daily no grade whereas was a MMRC before bevespi added last ov  MMRC1 = can walk nl pace, flat grade, can't hurry or go uphills or steps s sob      No obvious day to day or daytime variability or assoc excess/ purulent sputum or mucus plugs or hemoptysis or cp or chest tightness, subjective wheeze or overt sinus or hb symptoms. No unusual exp hx or h/o childhood pna/ asthma  or knowledge of premature birth.  Sleeping ok without nocturnal  or early am exacerbation  of respiratory  c/o's or need for noct saba. Also denies any obvious fluctuation of symptoms with weather or environmental changes or other aggravating or alleviating factors except as outlined above   Current Medications, Allergies, Complete Past Medical History, Past Surgical History, Family History, and Social History were reviewed in [employer] record.  ROS  The following are not active complaints unless bolded sore throat, dysphagia, dental problems, itching, sneezing,  nasal congestion or excess/ purulent secretions, ear ache,   fever, chills, sweats, unintended wt loss, classically pleuritic or exertional cp,  orthopnea pnd or leg swelling, presyncope, palpitations, abdominal pain, anorexia, nausea, vomiting, diarrhea  or change in bowel or bladder habits, change in stools or urine, dysuria,hematuria,  rash, arthralgias, visual complaints, headache, numbness, weakness or ataxia or problems with walking or coordination,  change in mood/affect or memory.              Objective:   Physical Exam  amb wm nad   Vital signs reviewed - Note on arrival 02 sats  96% on RA     05/22/2014          257  > 07/03/2014 256 > 01/30/2016   254 > 07/24/2016  244     01/09/14 244 lb (110.678 kg)  07/07/10 254 lb (115.214 kg)  05/02/10 252 lb (114.306 kg)  baseline 220 / 230 around 2010    HEENT: nl dentition, turbinates, and orophanx. Nl external ear canals without cough reflex   NECK :  without JVD/Nodes/TM/ nl carotid upstrokes bilaterally   LUNGS: no acc muscle use,      CV:  RRR  no s3 or murmur or increase in P2, no edema   ABD:  Quite obese but soft and nontender with nl excursion in the supine position. No bruits or organomegaly, bowel sounds nl  MS:  warm without deformities, calf tenderness, cyanosis or clubbing  SKIN: warm and dry without lesions    NEURO:  alert,  approp, no deficits          Assessment & Plan:

## 2016-07-24 NOTE — Patient Instructions (Addendum)
Bevespi or Anoro or Stiolto are the best choice for now for your breathing  - check your drug FORMULARY and return to see Tammy NP for a sample of trial  Only use your albuterol as a rescue medication to be used if you can't catch your breath by resting or doing a relaxed purse lip breathing pattern.  - The less you use it, the better it will work when you need it. - Ok to use up to 2 puffs  every 4 hours if you must but call for immediate appointment if use goes up over your usual need - Don't leave home without it !!  (think of it like the spare tire for your car)   The key is to stop smoking completely before smoking completely stops you!   Please schedule a follow up visit in 3 months but call sooner if needed

## 2016-07-25 NOTE — Assessment & Plan Note (Signed)

## 2016-07-25 NOTE — Assessment & Plan Note (Addendum)
HRCT 07/20/2016 1. Pulmonary parenchymal pattern of upper/mid lung zone predominant ground-glass coarsening and subpleural reticulation, unchanged from 07/06/2014 and suggestive of nonspecific interstitial pneumonitis.  Whatever this is appears benign/ stable and not his main problem or def related to asbestos exp so no need for additional w/u or rx other than work on complete smoking cessation, since smoking cigs  is assoc statistically as a risk factor  with all forms of PF (see separate a/p)

## 2016-07-25 NOTE — Assessment & Plan Note (Signed)
01/30/2016  After extensive coaching HFA effectiveness =    90% > try Bevespi 2bid x one month samples - PFT's  07/24/2016  FEV1 2.42 ( 86  % ) ratio 78  p 9 % improvement from saba p nothing  prior to study with DLCO  67 % corrects to 89 % for alv volume    Prev had Pt is Group B in terms of symptom/risk and laba/lama therefore appropriate rx and improved more than with symbicort so clearly benefiting from present rx  I had an extended discussion with the patient reviewing all relevant studies completed to date and  lasting 15 to 20 minutes of a 25 minute visit on the following ongoing concerns:   Formulary restrictions will be an ongoing challenge for the forseable future and I would be happy to pick an alternative if the pt will first  provide me a list of them but pt  will need to return here for training for any new device that is required eg dpi vs hfa vs respimat.    In meantime we can always provide samples so the patient never runs out of any needed respiratory medications.   Each maintenance medication was reviewed in detail including most importantly the difference between maintenance and as needed and under what circumstances the prns are to be used.  Please see instructions for details which were reviewed in writing and the patient given a copy.  [name]

## 2016-09-04 DIAGNOSIS — H60333 Swimmer's ear, bilateral: Secondary | ICD-10-CM | POA: Diagnosis not present

## 2016-09-04 DIAGNOSIS — R21 Rash and other nonspecific skin eruption: Secondary | ICD-10-CM | POA: Diagnosis not present

## 2016-09-29 DIAGNOSIS — I10 Essential (primary) hypertension: Secondary | ICD-10-CM | POA: Diagnosis not present

## 2016-09-29 DIAGNOSIS — J449 Chronic obstructive pulmonary disease, unspecified: Secondary | ICD-10-CM | POA: Diagnosis not present

## 2016-09-29 DIAGNOSIS — I25118 Atherosclerotic heart disease of native coronary artery with other forms of angina pectoris: Secondary | ICD-10-CM | POA: Diagnosis not present

## 2016-09-29 DIAGNOSIS — K219 Gastro-esophageal reflux disease without esophagitis: Secondary | ICD-10-CM | POA: Diagnosis not present

## 2016-09-29 DIAGNOSIS — E782 Mixed hyperlipidemia: Secondary | ICD-10-CM | POA: Diagnosis not present

## 2016-09-29 DIAGNOSIS — L989 Disorder of the skin and subcutaneous tissue, unspecified: Secondary | ICD-10-CM | POA: Diagnosis not present

## 2016-09-30 DIAGNOSIS — H6122 Impacted cerumen, left ear: Secondary | ICD-10-CM | POA: Diagnosis not present

## 2016-09-30 DIAGNOSIS — H9313 Tinnitus, bilateral: Secondary | ICD-10-CM | POA: Diagnosis not present

## 2016-09-30 DIAGNOSIS — R0683 Snoring: Secondary | ICD-10-CM | POA: Diagnosis not present

## 2016-09-30 DIAGNOSIS — J320 Chronic maxillary sinusitis: Secondary | ICD-10-CM | POA: Diagnosis not present

## 2016-09-30 DIAGNOSIS — R490 Dysphonia: Secondary | ICD-10-CM | POA: Diagnosis not present

## 2016-09-30 DIAGNOSIS — H903 Sensorineural hearing loss, bilateral: Secondary | ICD-10-CM | POA: Diagnosis not present

## 2016-09-30 DIAGNOSIS — H6523 Chronic serous otitis media, bilateral: Secondary | ICD-10-CM | POA: Diagnosis not present

## 2016-09-30 DIAGNOSIS — H6063 Unspecified chronic otitis externa, bilateral: Secondary | ICD-10-CM | POA: Diagnosis not present

## 2016-10-02 DIAGNOSIS — H6690 Otitis media, unspecified, unspecified ear: Secondary | ICD-10-CM | POA: Diagnosis not present

## 2016-10-02 DIAGNOSIS — E669 Obesity, unspecified: Secondary | ICD-10-CM | POA: Diagnosis not present

## 2016-10-02 DIAGNOSIS — Z0000 Encounter for general adult medical examination without abnormal findings: Secondary | ICD-10-CM | POA: Diagnosis not present

## 2016-10-02 DIAGNOSIS — M545 Low back pain: Secondary | ICD-10-CM | POA: Diagnosis not present

## 2016-10-02 DIAGNOSIS — R69 Illness, unspecified: Secondary | ICD-10-CM | POA: Diagnosis not present

## 2016-10-02 DIAGNOSIS — I10 Essential (primary) hypertension: Secondary | ICD-10-CM | POA: Diagnosis not present

## 2016-10-02 DIAGNOSIS — J449 Chronic obstructive pulmonary disease, unspecified: Secondary | ICD-10-CM | POA: Diagnosis not present

## 2016-10-02 DIAGNOSIS — K219 Gastro-esophageal reflux disease without esophagitis: Secondary | ICD-10-CM | POA: Diagnosis not present

## 2016-10-02 DIAGNOSIS — J019 Acute sinusitis, unspecified: Secondary | ICD-10-CM | POA: Diagnosis not present

## 2016-10-02 DIAGNOSIS — Z6839 Body mass index (BMI) 39.0-39.9, adult: Secondary | ICD-10-CM | POA: Diagnosis not present

## 2016-10-05 DIAGNOSIS — B355 Tinea imbricata: Secondary | ICD-10-CM | POA: Diagnosis not present

## 2016-10-05 DIAGNOSIS — S80861A Insect bite (nonvenomous), right lower leg, initial encounter: Secondary | ICD-10-CM | POA: Diagnosis not present

## 2016-10-05 DIAGNOSIS — S20361A Insect bite (nonvenomous) of right front wall of thorax, initial encounter: Secondary | ICD-10-CM | POA: Diagnosis not present

## 2016-10-05 DIAGNOSIS — S80862A Insect bite (nonvenomous), left lower leg, initial encounter: Secondary | ICD-10-CM | POA: Diagnosis not present

## 2016-10-06 DIAGNOSIS — H903 Sensorineural hearing loss, bilateral: Secondary | ICD-10-CM | POA: Diagnosis not present

## 2016-10-06 DIAGNOSIS — H6063 Unspecified chronic otitis externa, bilateral: Secondary | ICD-10-CM | POA: Diagnosis not present

## 2016-10-06 DIAGNOSIS — H6503 Acute serous otitis media, bilateral: Secondary | ICD-10-CM | POA: Diagnosis not present

## 2016-10-20 DIAGNOSIS — H6122 Impacted cerumen, left ear: Secondary | ICD-10-CM | POA: Diagnosis not present

## 2016-10-23 DIAGNOSIS — H90A31 Mixed conductive and sensorineural hearing loss, unilateral, right ear with restricted hearing on the contralateral side: Secondary | ICD-10-CM | POA: Diagnosis not present

## 2016-10-23 DIAGNOSIS — H90A22 Sensorineural hearing loss, unilateral, left ear, with restricted hearing on the contralateral side: Secondary | ICD-10-CM | POA: Diagnosis not present

## 2016-10-27 ENCOUNTER — Ambulatory Visit: Payer: Medicare HMO | Admitting: Internal Medicine

## 2016-11-05 DIAGNOSIS — L820 Inflamed seborrheic keratosis: Secondary | ICD-10-CM | POA: Diagnosis not present

## 2016-11-05 DIAGNOSIS — L408 Other psoriasis: Secondary | ICD-10-CM | POA: Diagnosis not present

## 2017-02-11 DIAGNOSIS — M62830 Muscle spasm of back: Secondary | ICD-10-CM | POA: Diagnosis not present

## 2017-02-11 DIAGNOSIS — R197 Diarrhea, unspecified: Secondary | ICD-10-CM | POA: Diagnosis not present

## 2017-02-16 DIAGNOSIS — R197 Diarrhea, unspecified: Secondary | ICD-10-CM | POA: Diagnosis not present

## 2017-03-16 DIAGNOSIS — R197 Diarrhea, unspecified: Secondary | ICD-10-CM | POA: Diagnosis not present

## 2017-03-16 DIAGNOSIS — J449 Chronic obstructive pulmonary disease, unspecified: Secondary | ICD-10-CM | POA: Diagnosis not present

## 2017-03-16 DIAGNOSIS — I25118 Atherosclerotic heart disease of native coronary artery with other forms of angina pectoris: Secondary | ICD-10-CM | POA: Diagnosis not present

## 2017-04-07 DIAGNOSIS — K640 First degree hemorrhoids: Secondary | ICD-10-CM | POA: Diagnosis not present

## 2017-04-07 DIAGNOSIS — K5289 Other specified noninfective gastroenteritis and colitis: Secondary | ICD-10-CM | POA: Diagnosis not present

## 2017-04-07 DIAGNOSIS — R197 Diarrhea, unspecified: Secondary | ICD-10-CM | POA: Diagnosis not present

## 2017-04-12 DIAGNOSIS — K5289 Other specified noninfective gastroenteritis and colitis: Secondary | ICD-10-CM | POA: Diagnosis not present

## 2017-05-05 DIAGNOSIS — K52832 Lymphocytic colitis: Secondary | ICD-10-CM | POA: Diagnosis not present

## 2017-10-20 DIAGNOSIS — R69 Illness, unspecified: Secondary | ICD-10-CM | POA: Diagnosis not present

## 2017-10-20 DIAGNOSIS — I739 Peripheral vascular disease, unspecified: Secondary | ICD-10-CM | POA: Diagnosis not present

## 2017-10-20 DIAGNOSIS — J449 Chronic obstructive pulmonary disease, unspecified: Secondary | ICD-10-CM | POA: Diagnosis not present

## 2017-10-20 DIAGNOSIS — K589 Irritable bowel syndrome without diarrhea: Secondary | ICD-10-CM | POA: Diagnosis not present

## 2017-10-20 DIAGNOSIS — K219 Gastro-esophageal reflux disease without esophagitis: Secondary | ICD-10-CM | POA: Diagnosis not present

## 2017-10-25 DIAGNOSIS — K52832 Lymphocytic colitis: Secondary | ICD-10-CM | POA: Diagnosis not present

## 2017-10-25 DIAGNOSIS — K6289 Other specified diseases of anus and rectum: Secondary | ICD-10-CM | POA: Diagnosis not present

## 2018-02-01 DIAGNOSIS — I10 Essential (primary) hypertension: Secondary | ICD-10-CM | POA: Diagnosis not present

## 2018-02-01 DIAGNOSIS — J449 Chronic obstructive pulmonary disease, unspecified: Secondary | ICD-10-CM | POA: Diagnosis not present

## 2018-02-01 DIAGNOSIS — K219 Gastro-esophageal reflux disease without esophagitis: Secondary | ICD-10-CM | POA: Diagnosis not present

## 2018-02-01 DIAGNOSIS — R69 Illness, unspecified: Secondary | ICD-10-CM | POA: Diagnosis not present

## 2018-02-01 DIAGNOSIS — E782 Mixed hyperlipidemia: Secondary | ICD-10-CM | POA: Diagnosis not present

## 2018-02-01 DIAGNOSIS — R7301 Impaired fasting glucose: Secondary | ICD-10-CM | POA: Diagnosis not present

## 2018-02-01 DIAGNOSIS — M545 Low back pain: Secondary | ICD-10-CM | POA: Diagnosis not present

## 2018-02-02 DIAGNOSIS — R7301 Impaired fasting glucose: Secondary | ICD-10-CM | POA: Diagnosis not present

## 2018-02-02 DIAGNOSIS — E782 Mixed hyperlipidemia: Secondary | ICD-10-CM | POA: Diagnosis not present

## 2018-02-03 ENCOUNTER — Ambulatory Visit: Payer: Medicare HMO | Admitting: Internal Medicine

## 2018-02-03 ENCOUNTER — Encounter: Payer: Self-pay | Admitting: Internal Medicine

## 2018-02-03 ENCOUNTER — Ambulatory Visit (INDEPENDENT_AMBULATORY_CARE_PROVIDER_SITE_OTHER)
Admission: RE | Admit: 2018-02-03 | Discharge: 2018-02-03 | Disposition: A | Discharge status: Home / Self Care | Payer: Medicare HMO | Source: Ambulatory Visit | Attending: Internal Medicine | Admitting: Internal Medicine

## 2018-02-03 VITALS — BP 132/86 | HR 68 | Ht 64.0 in | Wt 241.0 lb

## 2018-02-03 DIAGNOSIS — F17210 Nicotine dependence, cigarettes, uncomplicated: Secondary | ICD-10-CM

## 2018-02-03 DIAGNOSIS — R0602 Shortness of breath: Secondary | ICD-10-CM | POA: Diagnosis not present

## 2018-02-03 DIAGNOSIS — J449 Chronic obstructive pulmonary disease, unspecified: Secondary | ICD-10-CM

## 2018-02-03 DIAGNOSIS — R05 Cough: Secondary | ICD-10-CM | POA: Diagnosis not present

## 2018-02-03 DIAGNOSIS — R69 Illness, unspecified: Secondary | ICD-10-CM | POA: Diagnosis not present

## 2018-02-03 MED ORDER — AZITHROMYCIN 250 MG PO TABS: ORAL_TABLET | ORAL | 0 refills | Status: DC

## 2018-02-03 MED ORDER — GLYCOPYRROLATE-FORMOTEROL 9-4.8 MCG/ACT IN AERO
2.0000 | INHALATION_SPRAY | Freq: Two times a day (BID) | RESPIRATORY_TRACT | 0 refills | Status: DC
Start: 2018-02-03 — End: 2018-02-17

## 2018-02-03 NOTE — Patient Instructions (Addendum)
zpak   Work on inhaler technique:  relax and gently blow all the way out then take a nice smooth deep breath back in, triggering the inhaler at same time you start breathing in.  Hold for up to 5 seconds if you can. Blow out thru nose. Rinse and gargle with water when done     Omeprazole 20 mg x 2 Take 30-60 min before first meal of the day   The key is to stop smoking completely before smoking completely stops you!   Please remember to go to the  x-ray department downstairs in the basement  for your tests - we will call you with the results when they are available.      Return in 2 weeks to see Tammy NP with your drug formulary in hand to pick an alternative to bevespi

## 2018-02-03 NOTE — Progress Notes (Signed)
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 Pulmonary Consult    Pt needs pulmonary clearance for back surgery. He states his breathing is doing well and denies any respiratory co's at this time.   each am note some cough/ congestion/ gray brown x maybe a tbsp over the first hour, worst in winter. rec Stop smoking completely Prn saba  Did fine with back surgery and knee arthroscopy per Daniel Ball       07/24/2016  f/u ov/Daniel Ball re: COPD GOLD 0 / improved sob on  bevespi bid and saba biw avg  Chief Complaint  Patient presents with  . Follow-up    Coughing less and his breathing has improved. He uses albuterol inhaler 2 x per wk on average.   treadmill x 10-15 min x 2.5 mph x daily no grade whereas was a MMRC before bevespi added last ov  MMRC1 = can walk nl pace, flat grade, can't hurry or go uphills or steps s sob rec Bevespi or Anoro or Stiolto are the best choice for now for your breathing  - check your drug FORMULARY and return to see Daniel Ball for a sample of trial Only use your albuterol as a rescue medication The key is to stop smoking completely before smoking completely stops you!     02/03/2018  f/u ov/Daniel Ball re: cough Daniel Ball sob still smoking with GOLD 0 copd prev maint on bevespi  Chief Complaint  Patient presents with  . Acute Ball    Increased cough over the past 3-4 months. His cough is prod with brown to green sputum.  He states also having increased DOE- gets out of breath walking to his mailbox.  He has an albuterol inhaler that he uses 3 x per wk on average.    gadually worse x 3-4 m Dyspnea:  mb is flat x 200 ft does not need to stop, both hips also limiting  MMRC3 = can't walk 100 yards even at a slow pace at a flat grade s stopping due to sob   Cough: worse in ams when tends to be most green, worse since sob worse  Sleep: one pillow  SABA use:  slt increase over baseline   omeprazole 20 x 2 daily but not ac and having overt hb    No obvious day to day or daytime variability or assoc excess/ purulent sputum or mucus plugs or hemoptysis or cp or chest tightness, subjective wheeze or overt sinus  symptoms. No unusual exposure hx or h/o childhood pna/ asthma or knowledge of premature birth.    Also denies any obvious fluctuation of symptoms with weather or environmental changes or other aggravating or alleviating factors except as outlined above   Current Allergies, Complete Past Medical History, Past Surgical History, Family History, and Social History were reviewed in [employer] record.  ROS  The following are not active complaints unless bolded Hoarseness, sore throat, dysphagia, dental problems, itching, sneezing,  nasal congestion or discharge of excess mucus or purulent secretions,  ear ache,   fever, chills, sweats, unintended wt loss or wt gain, classically pleuritic or exertional cp,  orthopnea pnd or arm/hand swelling  or leg swelling, presyncope, palpitations, abdominal pain, anorexia, nausea, vomiting, diarrhea  or change in bowel habits or change in bladder habits, change in stools or change in urine, dysuria, hematuria,  rash, arthralgias, visual complaints, headache, numbness, weakness or ataxia or problems with walking or coordination,  change in mood or  memory.        Current Meds  Medication Sig  . albuterol (PROVENTIL HFA;VENTOLIN HFA) 108 (90 BASE) MCG/ACT inhaler Inhale 2 puffs into the lungs every 6 (six) hours as needed for wheezing or shortness of breath.  [name] aspirin EC 81 MG tablet Take 81 mg by mouth  daily.  [name] buPROPion (WELLBUTRIN XL) 150 MG 24 hr tablet Take 1 tablet by mouth daily.  . cyclobenzaprine (FLEXERIL) 10 MG tablet Take 10 mg by mouth 3 (three) times daily as needed for muscle spasms.  . Glycopyrrolate-Formoterol (BEVESPI AEROSPHERE) 9-4.8 MCG/ACT AERO Inhale 2 puffs into the lungs 2 (two) times daily.  [name] losartan (COZAAR) 100 MG tablet Take 100 mg by mouth daily.  [name] omeprazole (PRILOSEC) 20 MG capsule Take 20 mg by mouth daily.             Objective:   Physical Exam   amb wm gruff voice  Vital signs reviewed - Note on arrival 02 sats  97% on RA      05/22/2014          257  > 07/03/2014 256 > 01/30/2016   254 > 07/24/2016  244 >  02/03/2018  241     01/09/14 244 lb (110.678 kg)  07/07/10 254 lb (115.214 kg)  05/02/10 252 lb (114.306 kg)  baseline 220 / 230 around 2010    02/03/2018    HEENT:  Oropharynx Modified Mallampati Score =   3   /  Nl external ear canals without cough reflex moderate bilateral non-specific turbinate edema  slt mp discharge/ edentulous   NECK :  without JVD/Nodes/TM/ nl carotid upstrokes bilaterally   LUNGS: no acc muscle use,  Nl contour chest which is clear to A and P bilaterally without cough on insp or exp maneuvers   CV:  RRR  no s3 or murmur or increase in P2, and no edema   ABD: quite obese but nontender with limited  inspiratory excursion in the supine position. No bruits or organomegaly appreciated, bowel sounds nl  MS:  Nl gait/ ext warm without deformities, calf tenderness, cyanosis or clubbing No obvious joint restrictions   SKIN: warm and dry without lesions    NEURO:  alert, approp, nl sensorium with  no motor or cerebellar deficits apparent.           CXR PA and Lateral:   02/03/2018 :    I personally reviewed images and agree with radiology impression as follows:    1. Stable chronic fibrotic change.  No active lung disease. 2. Peribronchial thickening consistent with bronchitis. 3. Stable cardiomegaly and  probable small hiatal hernia.    Assessment & Plan:

## 2018-02-04 NOTE — Progress Notes (Signed)
Spoke with pt and notified of results per Dr. Wert. Pt verbalized understanding and denied any questions. 

## 2018-02-06 ENCOUNTER — Encounter: Payer: Self-pay | Admitting: Internal Medicine

## 2018-02-06 NOTE — Assessment & Plan Note (Signed)
Body mass index is 41.37 kg/m.  -  trending down slightly, encouraged  No results found for: TSH   Contributing to gerd risk/ doe/reviewed the need and the process to achieve and maintain neg calorie balance > defer f/u primary care including intermittently monitoring thyroid status

## 2018-02-06 NOTE — Assessment & Plan Note (Signed)
4-5 min discussion re active cigarette smoking in addition to office E&M  Ask about tobacco use:  ongoing Advise quitting  I took an extended  opportunity with this patient to outline the consequences of continued cigarette use  in airway disorders based on all the data we have from the multiple national lung health studies (perfomed over decades at millions of dollars in cost)  indicating that smoking cessation, not choice of inhalers or physicians, is the most important aspect of  His care.   Assess willingness not ready yet Assist in quit attempt already oon wellbutrin per pcp  Arrange follow up. Follow up per Primary Care planned

## 2018-02-06 NOTE — Assessment & Plan Note (Signed)
01/30/2016  try Bevespi 2bid  - PFT's  07/24/2016  FEV1 2.42 ( 86  % ) ratio 78  p 9 % improvement from saba p nothing  prior to study with DLCO  67 % corrects to 89 % for alv volume    - 02/03/2018  After extensive coaching inhaler device  effectiveness =    75%    DDX of  difficult airways management almost all start with A and  include Adherence, Ace Inhibitors, Acid Reflux, Active Sinus Disease, Alpha 1 Antitripsin deficiency, Anxiety masquerading as Airways dz,  ABPA,  Allergy(esp in young), Aspiration (esp in elderly), Adverse effects of meds,  Active smokers, A bunch of PE's (a small clot burden can't cause this syndrome unless there is already severe underlying pulm or vascular dz with poor reserve) plus two Bs  = Bronchiectasis and Beta blocker use..and one C= CHF   Adherence is always the initial "prime suspect" and is a multilayered concern that requires a "trust but verify" approach in every patient - starting with knowing how to use medications, especially inhalers, correctly, keeping up with refills and understanding the fundamental difference between maintenance and prns vs those medications only taken for a very short course and then stopped and not refilled.  - see hfa teaching above - return with all meds in hand using a trust but verify approach to confirm accurate Medication  Reconciliation The principal here is that until we are certain that the  patients are doing what we've asked, it makes no sense to ask them to do more.  - return with formular Formulary restrictions will be an ongoing challenge for the forseable future and I would be happy to pick an alternative if the pt will first  provide me a list of them but pt  will need to return here for training for any new device that is required eg dpi vs hfa vs respimat.    In meantime we can always provide samples so the patient never runs out of any needed respiratory medications.   Active smoking top of the rest of the list of  suspects:  See sep a/p  ? Acid (or non-acid) GERD > always difficult to exclude as up to 75% of pts in some series report no assoc GI/ Heartburn symptoms> rec max (24h)  acid suppression and diet restrictions/ reviewed and instructions given in writing.   ? Active sinusitis/rhinitis/ bronchitis > doubt : zapk for now    I had an extended discussion with the patient reviewing all relevant studies completed to date and  lasting 15 to 20 minutes of a 25 minute acute  visit    See device teaching which extended face to face time for this visit   Each maintenance medication was reviewed in detail including most importantly the difference between maintenance and prns and under what circumstances the prns are to be triggered using an action plan format that is not reflected in the computer generated alphabetically organized AVS.    Please see AVS for specific instructions unique to this visit that I personally wrote and verbalized to the the pt in detail and then reviewed with pt  by my nurse highlighting any  changes in therapy recommended at today's visit to their plan of care.

## 2018-02-09 DIAGNOSIS — M545 Low back pain: Secondary | ICD-10-CM | POA: Diagnosis not present

## 2018-02-09 DIAGNOSIS — R69 Illness, unspecified: Secondary | ICD-10-CM | POA: Diagnosis not present

## 2018-02-09 DIAGNOSIS — M5136 Other intervertebral disc degeneration, lumbar region: Secondary | ICD-10-CM | POA: Diagnosis not present

## 2018-02-09 DIAGNOSIS — Z6841 Body Mass Index (BMI) 40.0 and over, adult: Secondary | ICD-10-CM | POA: Diagnosis not present

## 2018-02-17 ENCOUNTER — Ambulatory Visit: Payer: Medicare HMO | Admitting: Adult Health

## 2018-02-17 ENCOUNTER — Encounter: Payer: Self-pay | Admitting: Adult Health

## 2018-02-17 VITALS — BP 142/80 | HR 87 | Ht 64.0 in | Wt 243.8 lb

## 2018-02-17 DIAGNOSIS — R69 Illness, unspecified: Secondary | ICD-10-CM | POA: Diagnosis not present

## 2018-02-17 DIAGNOSIS — Z23 Encounter for immunization: Secondary | ICD-10-CM

## 2018-02-17 DIAGNOSIS — J449 Chronic obstructive pulmonary disease, unspecified: Secondary | ICD-10-CM | POA: Diagnosis not present

## 2018-02-17 MED ORDER — IPRATROPIUM BROMIDE 0.02 % IN SOLN: 0.5000 mg | Freq: Three times a day (TID) | RESPIRATORY_TRACT | 12 refills | Status: DC

## 2018-02-17 MED ORDER — GLYCOPYRROLATE-FORMOTEROL 9-4.8 MCG/ACT IN AERO: 2.0000 | INHALATION_SPRAY | Freq: Two times a day (BID) | RESPIRATORY_TRACT | 0 refills | Status: DC

## 2018-02-17 NOTE — Patient Instructions (Addendum)
Finish BEVESPI 2 puffs Twice daily  Until neb machine is set up .  Begin Ipratropium Neb Three times a day  .  Work on not smoking .  Prevnar 13 vaccine .  Follow up with Dr. Melvyn Novas in 3 months and As needed

## 2018-02-17 NOTE — Assessment & Plan Note (Addendum)
Recent mild flare.  Now improved.  Insurance does not cover inhalers well.  He decided that he will switch to nebulizers which will be more affordable.  He will begin ipratropium 3 times daily. Smoking cessation encouraged  Plan  Patient Instructions  Finish BEVESPI 2 puffs Twice daily  Until neb machine is set up .  Begin Ipratropium Neb Three times a day  .  Work on not smoking .  Prevnar 13 vaccine .  Follow up with Dr. Melvyn Novas in 3 months and As needed

## 2018-02-17 NOTE — Progress Notes (Signed)
Chart and office note reviewed in detail  > agree with a/p as outlined    

## 2018-02-17 NOTE — Progress Notes (Signed)
@Patient  ID: Daniel Ball, male    DOB: [birth year], 62 y.o.   MRN: [id number]  Chief Complaint  Patient presents with  . Follow-up    COPD     Referring provider: Maury Dus, MD  HPI: 62 year old male active heavy smoker (2.5 ppd) followed for COPD   02/17/2018 Follow up : COPD /Smoker  Patient returns for a 2-week follow-up.  Patient was seen last visit with mild COPD flare.  He was given a Z-Pak.  And started on Bevespi patient says he is feeling better. Has decreased cough and shortness of breath. Patient's insurance does not have good inhaler coverage.  Inhalers are greater than $100 each month.  Which is difficult for patient to afford.  We went over his formulary and look for affordable options.  The main coverage that would be affordable would be a nebulizer. Patient continues to smoke.  We discussed smoking cessation.  He has never had a pneumonia vaccine..       No Known Allergies  Immunization History  Administered Date(s) Administered  . Pneumococcal Conjugate-13 02/17/2018    Past Medical History:  Diagnosis Date  . Arthritis   . COPD (chronic obstructive pulmonary disease) ([place])   . Coronary artery disease   . GERD (gastroesophageal reflux disease)   . Hyperlipidemia   . Hypertension    hx HBP - MEDS DC'D 10 YRS since BP has been in normal range  . Impingement syndrome of shoulder    left  . Shortness of breath    with exertion  . Sleep apnea    couldnt tolerate CPAP  . Spinal stenosis     Tobacco History: Social History   Tobacco Use  Smoking Status Current Every Day Smoker  . Packs/day: 2.50  . Years: 54.00  . Pack years: 135.00  . Types: Cigarettes  Smokeless Tobacco Never Used   Ready to quit: No Counseling given: Yes   Outpatient Encounter Medications as of 02/17/2018  Medication Sig  . albuterol (PROVENTIL HFA;VENTOLIN HFA) 108 (90 BASE) MCG/ACT inhaler Inhale 2 puffs into the lungs every 6 (six) hours as needed for wheezing  or shortness of breath.  [name] aspirin EC 81 MG tablet Take 81 mg by mouth daily.  [name] buPROPion (WELLBUTRIN XL) 150 MG 24 hr tablet Take 1 tablet by mouth daily.  . cyclobenzaprine (FLEXERIL) 10 MG tablet Take 10 mg by mouth 3 (three) times daily as needed for muscle spasms.  . Glycopyrrolate-Formoterol (BEVESPI AEROSPHERE) 9-4.8 MCG/ACT AERO Inhale 2 puffs into the lungs 2 (two) times daily.  [name] losartan (COZAAR) 100 MG tablet Take 100 mg by mouth daily.  [name] omeprazole (PRILOSEC) 20 MG capsule Take 20 mg by mouth daily.  [name] azithromycin (ZITHROMAX) 250 MG tablet Take 2 on day one then 1 daily x 4 days (Patient not taking: Reported on 02/17/2018)  . Glycopyrrolate-Formoterol (BEVESPI AEROSPHERE) 9-4.8 MCG/ACT AERO Inhale 2 puffs into the lungs 2 (two) times daily.  [name] ipratropium (ATROVENT) 0.02 % nebulizer solution Take 2.5 mLs (0.5 mg total) by nebulization 3 (three) times daily. DX J44.9  . [DISCONTINUED] Glycopyrrolate-Formoterol (BEVESPI AEROSPHERE) 9-4.8 MCG/ACT AERO Inhale 2 puffs into the lungs 2 (two) times daily.   No facility-administered encounter medications on file as of 02/17/2018.      Review of Systems  Constitutional:   No  weight loss, night sweats,  Fevers, chills,  +fatigue, or  lassitude.  HEENT:   No headaches,  Difficulty swallowing,  Tooth/dental problems, or  Sore  throat,                No sneezing, itching, ear ache, nasal congestion, post nasal drip,   CV:  No chest pain,  Orthopnea, PND, swelling in lower extremities, anasarca, dizziness, palpitations, syncope.   GI  No heartburn, indigestion, abdominal pain, nausea, vomiting, diarrhea, change in bowel habits, loss of appetite, bloody stools.   Resp:    No chest wall deformity  Skin: no rash or lesions.  GU: no dysuria, change in color of urine, no urgency or frequency.  No flank pain, no hematuria   MS:  No joint pain or swelling.  No decreased range of motion.  No back pain.    Physical Exam  BP (!) 142/80  (BP Location: Left Arm, Cuff Size: Normal)   Pulse 87   Ht 5\' 4"  (1.626 m)   Wt 243 lb 12.8 oz (110.6 kg)   SpO2 96%   BMI 41.85 kg/m   GEN: A/Ox3; pleasant , NAD, obese   HEENT:  [place]/AT,  EACs-clear, TMs-wnl, NOSE-clear, THROAT-clear, no lesions, no postnasal drip or exudate noted.   NECK:  Supple w/ fair ROM; no JVD; normal carotid impulses w/o bruits; no thyromegaly or nodules palpated; no lymphadenopathy.    RESP decreased breath sounds in the bases  no accessory muscle use, no dullness to percussion  CARD:  RRR, no m/r/g, no peripheral edema, pulses intact, no cyanosis or clubbing.  GI:   Soft & nt; nml bowel sounds; no organomegaly or masses detected.   Musco: Warm bil, no deformities or joint swelling noted.   Neuro: alert, no focal deficits noted.    Skin: Warm, no lesions or rashes    Lab Results:  CBC  BNP No results found for: BNP  ProBNP No results found for: PROBNP  Imaging: Dg Chest 2 View  Result Date: 02/03/2018 CLINICAL DATA:  Chronic cough, smoking history, shortness of breath which is worsening EXAM: CHEST - 2 VIEW COMPARISON:  CT chest of 07/20/2016 and chest x-ray of 03/01/2016 FINDINGS: Coarse prominent interstitial markings remain consistent with pulmonary fibrosis. No active infiltrate or effusion is seen. Mediastinal and hilar contours are unremarkable. There is some peribronchial thickening which may indicate bronchitis. The heart is mildly enlarged and stable. There may be a small hiatal hernia present. There are degenerative changes throughout the thoracic spine. IMPRESSION: 1. Stable chronic fibrotic change.  No active lung disease. 2. Peribronchial thickening consistent with bronchitis. 3. Stable cardiomegaly and probable small hiatal hernia. Electronically Signed   By: Ivar Drape M.D.   On: 02/03/2018 15:15     Assessment & Plan:   COPD 0/ still smoking  Recent mild flare.  Now improved.  Insurance does not cover inhalers well.  He  decided that he will switch to nebulizers which will be more affordable.  He will begin ipratropium 3 times daily. Smoking cessation encouraged  Plan  Patient Instructions  Finish BEVESPI 2 puffs Twice daily  Until neb machine is set up .  Begin Ipratropium Neb Three times a day  .  Work on not smoking .  Prevnar 13 vaccine .  Follow up with Dr. Melvyn Novas in 3 months and As needed            Rexene Edison, NP 02/17/2018

## 2018-02-18 DIAGNOSIS — J449 Chronic obstructive pulmonary disease, unspecified: Secondary | ICD-10-CM | POA: Diagnosis not present

## 2018-03-01 DIAGNOSIS — R69 Illness, unspecified: Secondary | ICD-10-CM | POA: Diagnosis not present

## 2018-03-20 DIAGNOSIS — J449 Chronic obstructive pulmonary disease, unspecified: Secondary | ICD-10-CM | POA: Diagnosis not present

## 2018-03-22 DIAGNOSIS — R69 Illness, unspecified: Secondary | ICD-10-CM | POA: Diagnosis not present

## 2018-04-17 DIAGNOSIS — R69 Illness, unspecified: Secondary | ICD-10-CM | POA: Diagnosis not present

## 2018-04-20 DIAGNOSIS — J449 Chronic obstructive pulmonary disease, unspecified: Secondary | ICD-10-CM | POA: Diagnosis not present

## 2018-05-05 DIAGNOSIS — J449 Chronic obstructive pulmonary disease, unspecified: Secondary | ICD-10-CM | POA: Diagnosis not present

## 2018-05-05 DIAGNOSIS — D696 Thrombocytopenia, unspecified: Secondary | ICD-10-CM | POA: Diagnosis not present

## 2018-05-05 DIAGNOSIS — E782 Mixed hyperlipidemia: Secondary | ICD-10-CM | POA: Diagnosis not present

## 2018-05-05 DIAGNOSIS — I10 Essential (primary) hypertension: Secondary | ICD-10-CM | POA: Diagnosis not present

## 2018-05-05 DIAGNOSIS — R7303 Prediabetes: Secondary | ICD-10-CM | POA: Diagnosis not present

## 2018-05-05 DIAGNOSIS — K219 Gastro-esophageal reflux disease without esophagitis: Secondary | ICD-10-CM | POA: Diagnosis not present

## 2018-05-05 DIAGNOSIS — R69 Illness, unspecified: Secondary | ICD-10-CM | POA: Diagnosis not present

## 2018-05-09 DIAGNOSIS — B351 Tinea unguium: Secondary | ICD-10-CM | POA: Diagnosis not present

## 2018-05-10 DIAGNOSIS — R69 Illness, unspecified: Secondary | ICD-10-CM | POA: Diagnosis not present

## 2018-05-20 ENCOUNTER — Encounter: Payer: Self-pay | Admitting: Internal Medicine

## 2018-05-20 ENCOUNTER — Ambulatory Visit: Payer: Medicare HMO | Admitting: Internal Medicine

## 2018-05-20 VITALS — BP 138/84 | HR 61 | Ht 63.25 in | Wt 240.8 lb

## 2018-05-20 DIAGNOSIS — J449 Chronic obstructive pulmonary disease, unspecified: Secondary | ICD-10-CM | POA: Diagnosis not present

## 2018-05-20 DIAGNOSIS — R69 Illness, unspecified: Secondary | ICD-10-CM | POA: Diagnosis not present

## 2018-05-20 DIAGNOSIS — F17210 Nicotine dependence, cigarettes, uncomplicated: Secondary | ICD-10-CM

## 2018-05-20 MED ORDER — PREDNISONE 10 MG PO TABS: ORAL_TABLET | ORAL | 0 refills | Status: DC

## 2018-05-20 NOTE — Progress Notes (Signed)
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 Pulmonary Consult    Pt needs pulmonary clearance for back surgery. He states his breathing is doing well and denies any respiratory co's at this time.   each am note some cough/ congestion/ gray brown x maybe a tbsp over the first hour, worst in winter. rec Stop smoking completely Prn saba  Did fine with back surgery and knee arthroscopy per Dr Tonita Cong       07/24/2016  f/u ov/Jacey Eckerson re: COPD GOLD 0 / improved sob on  bevespi bid and saba biw avg  Chief Complaint  Patient presents with  . Follow-up    Coughing less and his breathing has improved. He uses albuterol inhaler 2 x per wk on average.   treadmill x 10-15 min x 2.5 mph x daily no grade whereas was a MMRC before bevespi added last ov  MMRC1 = can walk nl pace, flat grade, can't hurry or go uphills or steps s sob rec Bevespi or Anoro or Stiolto are the best choice for now for your breathing  - check your drug FORMULARY and return to see Tammy NP for a sample of trial Only use your albuterol as a rescue medication The key is to stop smoking completely before smoking completely stops you!     02/03/2018  f/u ov/Waleska Buttery re: cough [name] sob still smoking with GOLD 0 copd prev maint on bevespi  Chief Complaint  Patient presents with  . Acute Visit    Increased cough over the past 3-4 months. His cough is prod with brown to green sputum.  He states also having increased DOE- gets out of breath walking to his mailbox.  He has an albuterol inhaler that he uses 3 x per wk on average.    gadually worse x 3-4 m Dyspnea:  mb is flat x 200 ft does not need to stop, both hips also limiting  MMRC3 = can't walk 100 yards even at a slow pace at a flat grade s stopping due to sob   Cough: worse in ams when tends to be most green, worse since sob worse  Sleep: one pillow  SABA use:  slt increase over baseline   omeprazole 20 x 2 daily but not ac and having overt hb  rec zpak  Work on inhaler technique:  relax and gently blow all the way out then take a nice smooth deep breath back in, triggering the inhaler at same time you start breathing in.  Hold for up to 5 seconds if you can. Blow out thru nose. Rinse and gargle with water when done Omeprazole 20 mg x 2 Take 30-60 min before first meal of the day  The key is to stop smoking completely before smoking completely stops you!  Please remember to go to the  x-ray department downstairs in the basement  for your tests - we will call you with the results when they are available.  Return in 2 weeks to see Tammy NP with your drug formulary  in hand to pick an alternative to bevespi > all over 100 dollars so she rec ipatropium tid    05/20/2018  f/u ov/Annagrace Carr re:  Copd 0/ still smoking Chief Complaint  Patient presents with  . Follow-up    Breathing is unchanged. He is coughin more over the past wk or so- mainly at night. He is coughing up some clear sputum. Since started atrovent nebs he rarely needs albuterol inhaler.   Dyspnea:  MMRC3 = can't walk 100 yards even at a slow pace at a flat grade s stopping due to sob   Cough: worse hs and in am x one week > clear mucus / stuffy nose Sleeping: 30 degrees adj bed  SABA use: avg once every 2 weeks and ipatropium neb tid  02: none     No obvious day to day or daytime variability or assoc excess/ purulent sputum or mucus plugs or hemoptysis or cp or chest tightness, subjective wheeze or overt sinus or hb symptoms.   Sleeps as above without nocturnal  or early am exacerbation  of respiratory   c/o's or need for noct saba. Also denies any obvious fluctuation of symptoms with weather or environmental changes or other aggravating or alleviating factors except as outlined above   No unusual exposure hx or h/o childhood pna/ asthma or knowledge of premature birth.  Current Allergies, Complete Past Medical History, Past Surgical History, Family History, and Social History were reviewed in [employer] record.  ROS  The following are not active complaints unless bolded Hoarseness, sore throat, dysphagia, dental problems, itching, sneezing,  nasal congestion or discharge of excess mucus or purulent secretions, ear ache,   fever, chills, sweats, unintended wt loss or wt gain, classically pleuritic or exertional cp,  orthopnea pnd or arm/hand swelling  or leg swelling, presyncope, palpitations, abdominal pain, anorexia, nausea, vomiting, diarrhea  or change in bowel habits or change in bladder habits, change in stools or change in urine, dysuria, hematuria,  rash, arthralgias, visual complaints, headache, numbness, weakness or ataxia or problems with walking or coordination,  change in mood or  memory.        Current Meds  Medication Sig  . albuterol (PROVENTIL HFA;VENTOLIN HFA) 108 (90 BASE) MCG/ACT inhaler Inhale 2 puffs into the lungs every 6 (six) hours as needed for wheezing or shortness of breath.  [name] buPROPion (WELLBUTRIN XL) 150 MG 24 hr tablet Take 1 tablet by mouth daily.  . cyclobenzaprine (FLEXERIL) 10 MG tablet Take 10 mg by mouth 3 (three) times daily as needed for muscle spasms.  [name] ipratropium (ATROVENT) 0.02 % nebulizer solution Take 2.5 mLs (0.5 mg total) by nebulization 3 (three) times daily. DX J44.9  . losartan (COZAAR) 100 MG tablet Take 100 mg by mouth daily.  [name] omeprazole (PRILOSEC) 20 MG capsule Take 20 mg by mouth daily.             Objective:   Physical Exam  pleaseant amb obese wm / gruff voice   Vital signs reviewed - Note on arrival 02  sats  97% on RA      05/22/2014          257  > 07/03/2014 256 > 01/30/2016   254 > 07/24/2016  244 >  02/03/2018  241 > 05/20/2018  240     01/09/14 244 lb (110.678 kg)  07/07/10 254 lb (115.214 kg)  05/02/10 252 lb (114.306 kg)  baseline 220 / 230 around 2010  HEENT: nl  oropharynx. Nl external ear canals without cough reflex Edentulous/ moderate bilateral non-specific turbinate edema   Modified Mallampati Score =   3    NECK :  without JVD/Nodes/TM/ nl carotid upstrokes bilaterally   LUNGS: no acc muscle use,  Nl contour chest which is clear to A and P bilaterally without cough on insp or exp maneuvers   CV:  RRR  no s3 or murmur or increase in P2, and no edema   ABD:  soft and nontender with nl inspiratory excursion in the supine position. No bruits or organomegaly appreciated, bowel sounds nl  MS:  Nl gait/ ext warm without deformities, calf tenderness, cyanosis or clubbing No obvious joint restrictions   SKIN: warm and dry without lesions    NEURO:  alert, approp, nl sensorium with  no motor or cerebellar deficits apparent.           Assessment & Plan:

## 2018-05-20 NOTE — Patient Instructions (Addendum)
Prednisone 10 mg take  4 each am x 2 days,   2 each am x 2 days,  1 each am x 2 days and stop   You can use the ipratroprium up to 4 x daily if nebulizer is handy  Only use your albuterol as a rescue medication to be used if you can't catch your breath by resting or doing a relaxed purse lip breathing pattern.  - The less you use it, the better it will work when you need it. - Ok to use up to 2 puffs  every 4 hours if you must but call for immediate appointment if use goes up over your usual need - Don't leave home without it !!  (think of it like the spare tire for your car)   Keep working on the smoking cessation - it's the most important aspect of your care!!   Please schedule a follow up visit in 3 months but call sooner if needed with PFTs on return

## 2018-05-20 NOTE — Assessment & Plan Note (Signed)
4-5 min discussion re active cigarette smoking in addition to office E&M  Ask about tobacco use:   Ongoing but down from 50 to 11 cigs per day Advise quitting   I emphasized that although we never turn away smokers from the pulmonary clinic, we do ask that they understand that the recommendations that we make  won't work nearly as well in the presence of continued cigarette exposure. In fact, we may very well  reach a point where we can't promise to help the patient if he/she can't quit smoking. (We can and will promise to try to help, we just can't promise what we recommend will really work)  Assess willingness:  Not committed at this point to fully quitting but working on it Assist in quit attempt:  Per PCP when ready Arrange follow up:   Follow up per Primary Care planned

## 2018-05-20 NOTE — Assessment & Plan Note (Signed)
Body mass index is 42.32 kg/m.  -  trending slt down/ enrouraged  No results found for: TSH   Contributing to gerd risk/ doe/reviewed the need and the process to achieve and maintain neg calorie balance > defer f/u primary care including intermittently monitoring thyroid status

## 2018-05-20 NOTE — Assessment & Plan Note (Addendum)
01/30/2016  try Bevespi 2bid  - PFT's  07/24/2016  FEV1 2.42 ( 86  % ) ratio 78  p 9 % improvement from saba p nothing  prior to study with DLCO  67 % corrects to 89 % for alv volume    - 02/03/2018  After extensive coaching inhaler device  effectiveness =    75%    - The proper method of use, as well as anticipated side effects, of a metered-dose inhaler are discussed and demonstrated to the patient using teach back technique.   Having mild flare in setting of recent uri s persistent purulent sputum so rec Prednisone 10 mg take  4 each am x 2 days,   2 each am x 2 days,  1 each am x 2 days and stop   Ok to continue sama up to qid and add the sama hfa prn but cautioned the latter does risk tachyphylaxis and to call if saba need goes up    I had an extended discussion with the patient reviewing all relevant studies completed to date and  lasting 15 to 20 minutes of a 25 minute visit    See device teaching which extended face to face time for this visit.  Each maintenance medication was reviewed in detail including emphasizing most importantly the difference between maintenance and prns and under what circumstances the prns are to be triggered using an action plan format that is not reflected in the computer generated alphabetically organized AVS which I have not found useful in most complex patients, especially with respiratory illnesses  Please see AVS for specific instructions unique to this visit that I personally wrote and verbalized to the the pt in detail and then reviewed with pt  by my nurse highlighting any  changes in therapy recommended at today's visit to their plan of care.

## 2018-05-21 DIAGNOSIS — J449 Chronic obstructive pulmonary disease, unspecified: Secondary | ICD-10-CM | POA: Diagnosis not present

## 2018-05-26 DIAGNOSIS — R69 Illness, unspecified: Secondary | ICD-10-CM | POA: Diagnosis not present

## 2018-06-07 DIAGNOSIS — M25551 Pain in right hip: Secondary | ICD-10-CM | POA: Diagnosis not present

## 2018-06-07 DIAGNOSIS — M5136 Other intervertebral disc degeneration, lumbar region: Secondary | ICD-10-CM | POA: Diagnosis not present

## 2018-06-07 DIAGNOSIS — M545 Low back pain: Secondary | ICD-10-CM | POA: Diagnosis not present

## 2018-06-07 DIAGNOSIS — M7061 Trochanteric bursitis, right hip: Secondary | ICD-10-CM | POA: Diagnosis not present

## 2018-06-15 DIAGNOSIS — R69 Illness, unspecified: Secondary | ICD-10-CM | POA: Diagnosis not present

## 2018-06-20 DIAGNOSIS — J449 Chronic obstructive pulmonary disease, unspecified: Secondary | ICD-10-CM | POA: Diagnosis not present

## 2018-06-21 DIAGNOSIS — M545 Low back pain: Secondary | ICD-10-CM | POA: Diagnosis not present

## 2018-06-21 DIAGNOSIS — M25551 Pain in right hip: Secondary | ICD-10-CM | POA: Diagnosis not present

## 2018-06-28 DIAGNOSIS — R69 Illness, unspecified: Secondary | ICD-10-CM | POA: Diagnosis not present

## 2018-06-28 DIAGNOSIS — M545 Low back pain: Secondary | ICD-10-CM | POA: Diagnosis not present

## 2018-06-28 DIAGNOSIS — M25551 Pain in right hip: Secondary | ICD-10-CM | POA: Diagnosis not present

## 2018-06-28 DIAGNOSIS — Z6841 Body Mass Index (BMI) 40.0 and over, adult: Secondary | ICD-10-CM | POA: Diagnosis not present

## 2018-07-11 DIAGNOSIS — R69 Illness, unspecified: Secondary | ICD-10-CM | POA: Diagnosis not present

## 2018-07-13 DIAGNOSIS — M25551 Pain in right hip: Secondary | ICD-10-CM | POA: Diagnosis not present

## 2018-07-18 DIAGNOSIS — B351 Tinea unguium: Secondary | ICD-10-CM | POA: Diagnosis not present

## 2018-07-21 DIAGNOSIS — J449 Chronic obstructive pulmonary disease, unspecified: Secondary | ICD-10-CM | POA: Diagnosis not present

## 2018-08-04 DIAGNOSIS — R69 Illness, unspecified: Secondary | ICD-10-CM | POA: Diagnosis not present

## 2018-08-20 DIAGNOSIS — J449 Chronic obstructive pulmonary disease, unspecified: Secondary | ICD-10-CM | POA: Diagnosis not present

## 2018-08-22 ENCOUNTER — Ambulatory Visit: Payer: Medicare HMO | Admitting: Internal Medicine

## 2018-08-22 ENCOUNTER — Ambulatory Visit (INDEPENDENT_AMBULATORY_CARE_PROVIDER_SITE_OTHER): Payer: Medicare HMO | Admitting: Internal Medicine

## 2018-08-22 ENCOUNTER — Encounter: Payer: Self-pay | Admitting: Internal Medicine

## 2018-08-22 VITALS — BP 126/72 | HR 80 | Ht 64.0 in | Wt 253.8 lb

## 2018-08-22 DIAGNOSIS — F17210 Nicotine dependence, cigarettes, uncomplicated: Secondary | ICD-10-CM

## 2018-08-22 DIAGNOSIS — Z77090 Contact with and (suspected) exposure to asbestos: Secondary | ICD-10-CM | POA: Diagnosis not present

## 2018-08-22 DIAGNOSIS — R0609 Other forms of dyspnea: Secondary | ICD-10-CM | POA: Diagnosis not present

## 2018-08-22 DIAGNOSIS — R69 Illness, unspecified: Secondary | ICD-10-CM | POA: Diagnosis not present

## 2018-08-22 DIAGNOSIS — J449 Chronic obstructive pulmonary disease, unspecified: Secondary | ICD-10-CM | POA: Diagnosis not present

## 2018-08-22 DIAGNOSIS — R0600 Dyspnea, unspecified: Secondary | ICD-10-CM

## 2018-08-22 LAB — CBC WITH DIFFERENTIAL/PLATELET
Basophils Absolute: 0.1 10*3/uL (ref 0.0–0.1)
Basophils Relative: 0.8 % (ref 0.0–3.0)
Eosinophils Absolute: 0.2 10*3/uL (ref 0.0–0.7)
Eosinophils Relative: 2.4 % (ref 0.0–5.0)
HCT: 45.8 % (ref 39.0–52.0)
Hemoglobin: 15.8 g/dL (ref 13.0–17.0)
Lymphocytes Relative: 24 % (ref 12.0–46.0)
Lymphs Abs: 1.8 10*3/uL (ref 0.7–4.0)
MCHC: 34.4 g/dL (ref 30.0–36.0)
MCV: 94.4 fl (ref 78.0–100.0)
MONO ABS: 0.7 10*3/uL (ref 0.1–1.0)
Monocytes Relative: 9.6 % (ref 3.0–12.0)
Neutro Abs: 4.7 10*3/uL (ref 1.4–7.7)
Neutrophils Relative %: 63.2 % (ref 43.0–77.0)
Platelets: 132 10*3/uL — ABNORMAL LOW (ref 150.0–400.0)
RBC: 4.85 Mil/uL (ref 4.22–5.81)
RDW: 14.2 % (ref 11.5–15.5)
WBC: 7.4 10*3/uL (ref 4.0–10.5)

## 2018-08-22 LAB — BASIC METABOLIC PANEL
BUN: 18 mg/dL (ref 6–23)
CO2: 31 mEq/L (ref 19–32)
Calcium: 10.1 mg/dL (ref 8.4–10.5)
Chloride: 102 mEq/L (ref 96–112)
Creatinine, Ser: 1.02 mg/dL (ref 0.40–1.50)
GFR: 77.96 mL/min (ref >60.00)
Glucose, Bld: 101 mg/dL — ABNORMAL HIGH (ref 70–99)
Potassium: 4.7 mEq/L (ref 3.5–5.1)
Sodium: 139 mEq/L (ref 135–145)

## 2018-08-22 LAB — PULMONARY FUNCTION TEST
DL/VA % pred: 80 %
DL/VA: 3.38 ml/min/mmHg/L
DLCO unc % pred: 60 %
DLCO unc: 14.66 ml/min/mmHg
FEF 25-75 PRE: 1.82 L/s
FEF 25-75 Post: 1.71 L/sec
FEF2575-%Change-Post: -5 %
FEF2575-%PRED-PRE: 81 %
FEF2575-%Pred-Post: 76 %
FEV1-%Change-Post: 0 %
FEV1-%PRED-POST: 80 %
FEV1-%Pred-Pre: 80 %
FEV1-PRE: 2.22 L
FEV1-Post: 2.21 L
FEV1FVC-%Change-Post: -1 %
FEV1FVC-%Pred-Pre: 100 %
FEV6-%Change-Post: 0 %
FEV6-%Pred-Post: 85 %
FEV6-%Pred-Pre: 84 %
FEV6-POST: 2.96 L
FEV6-Pre: 2.94 L
FEV6FVC-%Change-Post: 0 %
FEV6FVC-%PRED-POST: 105 %
FEV6FVC-%Pred-Pre: 106 %
FVC-%Change-Post: 1 %
FVC-%Pred-Post: 81 %
FVC-%Pred-Pre: 79 %
FVC-POST: 2.98 L
FVC-Pre: 2.94 L
POST FEV6/FVC RATIO: 99 %
PRE FEV1/FVC RATIO: 76 %
Post FEV1/FVC ratio: 74 %
Pre FEV6/FVC Ratio: 100 %
RV % pred: 110 %
RV: 2.2 L
TLC % pred: 89 %
TLC: 5.19 L

## 2018-08-22 LAB — SEDIMENTATION RATE: Sed Rate: 8 mm/hr (ref 0–20)

## 2018-08-22 LAB — TSH: TSH: 2.22 u[IU]/mL (ref 0.35–4.50)

## 2018-08-22 LAB — BRAIN NATRIURETIC PEPTIDE: Pro B Natriuretic peptide (BNP): 43 pg/mL (ref 0.0–100.0)

## 2018-08-22 MED ORDER — ALBUTEROL SULFATE HFA 108 (90 BASE) MCG/ACT IN AERS: 2.0000 | INHALATION_SPRAY | Freq: Four times a day (QID) | RESPIRATORY_TRACT | 3 refills | Status: DC | PRN

## 2018-08-22 NOTE — Progress Notes (Signed)
PFT completed 08/22/18  

## 2018-08-22 NOTE — Progress Notes (Signed)
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 Pulmonary Consult    Pt needs pulmonary clearance for back surgery. He states his breathing is doing well and denies any respiratory co's at this time.   each am note some cough/ congestion/ gray brown x maybe a tbsp over the first hour, worst in winter. rec Stop smoking completely Prn saba  Did fine with back surgery and knee arthroscopy per Dr Tonita Cong       07/24/2016  f/u ov/Jaelyn Cloninger re: COPD GOLD 0 / improved sob on  bevespi bid and saba biw avg  Chief Complaint  Patient presents with  . Follow-up    Coughing less and his breathing has improved. He uses albuterol inhaler 2 x per wk on average.   treadmill x 10-15 min x 2.5 mph x daily no grade whereas was a MMRC before bevespi added last ov  MMRC1 = can walk nl pace, flat grade, can't hurry or go uphills or steps s sob rec Bevespi or Anoro or Stiolto are the best choice for now for your breathing  - check your drug FORMULARY and return to see Tammy NP for a sample of trial Only use your albuterol as a rescue medication The key is to stop smoking completely before smoking completely stops you!     02/03/2018  f/u ov/Icela Glymph re: cough [name] sob still smoking with GOLD 0 copd prev maint on bevespi  Chief Complaint  Patient presents with  . Acute Visit    Increased cough over the past 3-4 months. His cough is prod with brown to green sputum.  He states also having increased DOE- gets out of breath walking to his mailbox.  He has an albuterol inhaler that he uses 3 x per wk on average.    gadually worse x 3-4 m Dyspnea:  mb is flat x 200 ft does not need to stop, both hips also limiting  MMRC3 = can't walk 100 yards even at a slow pace at a flat grade s stopping due to sob   Cough: worse in ams when tends to be most green, worse since sob worse  Sleep: one pillow  SABA use:  slt increase over baseline   omeprazole 20 x 2 daily but not ac and having overt hb  rec zpak  Work on inhaler technique:   The key is to stop smoking completely before smoking completely stops you!  Please remember to go to the  x-ray department downstairs in the basement  for your tests - we will call you with the results when they are available.  Return in 2 weeks to see Tammy NP with your drug formulary in hand to pick an alternative to bevespi > all over 100 dollars so she rec ipatropium tid    05/20/2018  f/u ov/Nathifa Ritthaler re:  Copd 0/ still smoking Chief Complaint  Patient presents with  . Follow-up    Breathing is unchanged. He is coughin more over the past wk or so- mainly at night. He is coughing  up some clear sputum. Since started atrovent nebs he rarely needs albuterol inhaler.   Dyspnea:  MMRC3 = can't walk 100 yards even at a slow pace at a flat grade s stopping due to sob   Cough: worse hs and in am x one week > clear mucus / stuffy nose Sleeping: 30 degrees adj bed  SABA use: avg once every 2 weeks and ipatropium neb tid  02: none   rec Prednisone 10 mg take  4 each am x 2 days,   2 each am x 2 days,  1 each am x 2 days and stop  You can use the ipratroprium up to 4 x daily if nebulizer is handy Only use your albuterol as a rescue medication Keep working on the smoking cessation - it's the most important aspect of your care!!    08/22/2018  f/u ov/Adil Tugwell re: copd gold 0 / still smoking no maint rx  Chief Complaint  Patient presents with  . Follow-up    follows for COPD. uses albuterol a few times a week. feels like breathing is getting worse, gets short winded easy. decreased smoking  to 10 cigarettes daily.   Dyspnea: MMRC3 = can't walk 100 yards even at a slow pace at a flat grade s stopping due to sob / legs hurting esp R hip Struggles to daughter's house and back, min elevation Cough: first thing am/ just to clear throat thick white < tsp  Sleeping: 10 degrees adj bed/ 1 pillow SABA use: not much at all  02: none     No obvious day to day or daytime variability or assoc  purulent sputum or mucus plugs or hemoptysis or cp or chest tightness, subjective wheeze or overt sinus or hb symptoms.   Sleeping as above  without nocturnal  exacerbation  of respiratory  c/o's or need for noct saba. Also denies any obvious fluctuation of symptoms with weather or environmental changes or other aggravating or alleviating factors except as outlined above   No unusual exposure hx or h/o childhood pna/ asthma or knowledge of premature birth.  Current Allergies, Complete Past Medical History, Past Surgical History, Family History, and Social History were reviewed in [employer] record.  ROS  The following are not active complaints unless bolded Hoarseness, sore throat, dysphagia, dental problems, itching, sneezing,  nasal congestion or discharge of excess mucus or purulent secretions, ear ache,   fever, chills, sweats, unintended wt loss or wt gain, classically pleuritic or exertional cp,  orthopnea pnd or arm/hand swelling  or leg swelling, presyncope, palpitations, abdominal pain, anorexia, nausea, vomiting, diarrhea  or change in bowel habits or change in bladder habits, change in stools or change in urine, dysuria, hematuria,  rash, arthralgias, visual complaints, headache, numbness, weakness or ataxia or problems with walking or coordination,  change in mood or  memory.        Current Meds  Medication Sig  . albuterol (PROVENTIL HFA;VENTOLIN HFA) 108 (90 Base) MCG/ACT inhaler Inhale 2 puffs into the lungs every 6 (six) hours as needed for wheezing or shortness  of breath.  [name] buPROPion (WELLBUTRIN XL) 150 MG 24 hr tablet Take 1 tablet by mouth daily.  . cyclobenzaprine (FLEXERIL) 10 MG tablet Take 10 mg by mouth 3 (three) times daily as needed for muscle spasms.  [name] ipratropium (ATROVENT) 0.02 % nebulizer solution Take 2.5 mLs (0.5 mg total) by nebulization 3 (three) times daily. DX J44.9  . losartan (COZAAR) 100 MG tablet Take  100 mg by mouth daily.  [name] omeprazole (PRILOSEC) 20 MG capsule Take 20 mg by mouth daily.  .                       Objective:   Physical Exam     amb obese wm gruff voice nad     05/22/2014          257  > 07/03/2014 256 > 01/30/2016   254 > 07/24/2016  244 >  02/03/2018  241 > 05/20/2018  240 > 08/22/2018   254     01/09/14 244 lb (110.678 kg)  07/07/10 254 lb (115.214 kg)  05/02/10 252 lb (114.306 kg)  baseline 220 / 230 around 2010     HEENT: Edentulous oropharynx. Nl external ear canals without cough reflex -  Mild bilateral non-specific turbinate edema   Modified Mallampati Score =   3    NECK :  without JVD/Nodes/TM/ nl carotid upstrokes bilaterally   LUNGS: no acc muscle use,  Mild barrel  contour chest wall with bilateral  Distant bs s audible wheeze and  without cough on insp or exp maneuver and mild  Hyperresonant  to  percussion bilaterally     CV:  RRR  no s3 or murmur or increase in P2, and no edema   ABD:  Obese/  nontender with pos end  insp Hoover's  in the supine position. No bruits or organomegaly appreciated, bowel sounds nl  MS:   Nl gait/  ext warm without deformities, calf tenderness, cyanosis or clubbing No obvious joint restrictions   SKIN: warm and dry without lesions    NEURO:  alert, approp, nl sensorium with  no motor or cerebellar deficits apparent.        Labs ordered/ reviewed:      Chemistry      Component Value Date/Time   NA 139 08/22/2018 1017   K 4.7 08/22/2018 1017   CL 102 08/22/2018 1017   CO2 31 08/22/2018 1017   BUN 18 08/22/2018 1017   CREATININE 1.02  08/22/2018 1017      Component Value Date/Time   CALCIUM 10.1 08/22/2018 1017        Lab Results  Component Value Date   WBC 7.4 08/22/2018   HGB 15.8 08/22/2018   HCT 45.8 08/22/2018   MCV 94.4 08/22/2018   PLT 132.0 (L) 08/22/2018         Lab Results  Component Value Date   TSH 2.22 08/22/2018     Lab Results  Component Value Date   PROBNP 43.0 08/22/2018       Lab Results  Component Value Date   ESRSEDRATE 8 08/22/2018                 Assessment & Plan:

## 2018-08-22 NOTE — Patient Instructions (Signed)
Please see patient coordinator before you leave today  to schedule High resolution CT chest  Please remember to go to the lab department   for your tests - we will call you with the results when they are available.  Work on stopping smoking and weight loss with Dr Darden Dates help       If you are satisfied with your treatment plan,  let your doctor know and he/she can either refill your medications or you can return here when your prescription runs out.     If in any way you are not 100% satisfied,  please tell us.  If 100% better, tell your friends!  Pulmonary follow up is as needed

## 2018-08-23 NOTE — Progress Notes (Signed)
Spoke with pt and notified of results per Dr. Wert. Pt verbalized understanding and denied any questions. 

## 2018-08-24 ENCOUNTER — Encounter: Payer: Self-pay | Admitting: Internal Medicine

## 2018-08-24 NOTE — Assessment & Plan Note (Signed)
4-5 min discussion re active cigarette smoking in addition to office E&M  Ask about tobacco use:   ongoing Advise quitting    I reviewed the Fletcher curve with the patient that basically indicates  if you quit smoking when your best day FEV1 is still well preserved (as is clearly  the case here)  it is highly unlikely you will progress to severe disease and informed the patient there was  no medication on the market that has proven to alter the curve/ its downward trajectory  or the likelihood of progression of their disease(unlike other chronic medical conditions such as atheroclerosis where we do think we can change the natural hx with risk reducing meds)    Therefore stopping smoking and maintaining abstinence are  the most important aspects of his care, not choice of inhalers or for that matter, doctors.  Treatment other than smoking cessation  is entirely directed by severity of symptoms and focused also on reducing exacerbations, not attempting to change the natural history of the disease so prn saba and smoking cessation all hat's needed for now Assess willingness:  Not committed at this point Assist in quit attempt:  Per PCP when ready Arrange follow up:   Follow up per Primary Care planned       I had an extended discussion with the patient   reviewing all relevant studies completed to date and  lasting 15 to 20 minutes of a 25 minute visit  which included directly observing ambulatory 02 saturation study documented in a/p section of  today's  office note.  Each maintenance medication was reviewed in detail including most importantly the difference between maintenance and prns and under what circumstances the prns are to be triggered using an action plan format that is not reflected in the computer generated alphabetically organized AVS.     Please see AVS for specific instructions unique to this visit that I personally wrote and verbalized to the the pt in detail and then reviewed with  pt  by my nurse highlighting any changes in therapy recommended at today's visit .

## 2018-08-24 NOTE — Assessment & Plan Note (Signed)
See walking study this ov (under copd dx)  No evidence of anemia/ thyroid dz or chf so continue to believe obesity deconditioning are the main factors here.

## 2018-08-24 NOTE — Assessment & Plan Note (Signed)
01/30/2016  try Bevespi 2bid  - PFT's  07/24/2016  FEV1 2.42 ( 86  % ) ratio 78  p 9 % improvement from saba p nothing  prior to study with DLCO  67 % corrects to 89 % for alv volume   - PFT's  08/22/2018  FEV1 2.21 (80 % ) ratio 74  p 0 % improvement from saba p nothing prior to study with DLCO  60 % corrects to 80 % for alv volume   - 08/22/2018   Walked RA  2 laps @ 284ft each @ brisk pace  stopped due to  End of study, no desat, legs stopped before sob    Patient continues with group a symptoms so he can just continue as needed albuterol and work harder on weight loss conditioning issues and stopping smoking, which remain a major challenge.

## 2018-08-24 NOTE — Assessment & Plan Note (Addendum)
HRCT 07/20/2016 1. Pulmonary parenchymal pattern of upper/mid lung zone predominant ground-glass coarsening and subpleural reticulation, unchanged from -CTa 07/06/2014 1. No pulmonary emboli. 2. Progressive chronic interstitial lung disease and peribronchial thickening. The patient has a history of asbestos exposure. The appearance is not definitive for asbestosis but could represent asbestosis.   HRCT 09/08/18 scheduled

## 2018-09-01 DIAGNOSIS — R69 Illness, unspecified: Secondary | ICD-10-CM | POA: Diagnosis not present

## 2018-09-08 ENCOUNTER — Telehealth: Payer: Self-pay | Admitting: Internal Medicine

## 2018-09-08 ENCOUNTER — Ambulatory Visit (HOSPITAL_COMMUNITY)
Admission: RE | Admit: 2018-09-08 | Discharge: 2018-09-08 | Disposition: A | Discharge status: Home / Self Care | Payer: Medicare HMO | Source: Ambulatory Visit | Attending: Internal Medicine | Admitting: Internal Medicine

## 2018-09-08 DIAGNOSIS — J849 Interstitial pulmonary disease, unspecified: Secondary | ICD-10-CM | POA: Diagnosis not present

## 2018-09-08 DIAGNOSIS — R0609 Other forms of dyspnea: Secondary | ICD-10-CM

## 2018-09-08 DIAGNOSIS — R0600 Dyspnea, unspecified: Secondary | ICD-10-CM

## 2018-09-08 NOTE — Progress Notes (Signed)
LMTCB

## 2018-09-08 NOTE — Telephone Encounter (Signed)
Notes recorded by Tanda Rockers, MD on 09/08/2018 at 1:30 PM EST Call patient : Study is un changed from prior which just shows mostly scarring and nothing that needs immediate attention but need to sit Down p holidays and go over the long range goals of keeping him healthy and what if any further pulmonary rx or f/u needed long term > 4-6 weeks is fine   Pt is aware of results and voiced his understanding.  Pt has been scheduled for OV with MW on 10/11/18 at 10:30a. Nothing further is needed.

## 2018-09-20 DIAGNOSIS — J449 Chronic obstructive pulmonary disease, unspecified: Secondary | ICD-10-CM | POA: Diagnosis not present

## 2018-10-05 DIAGNOSIS — B351 Tinea unguium: Secondary | ICD-10-CM | POA: Diagnosis not present

## 2018-10-11 ENCOUNTER — Encounter: Payer: Self-pay | Admitting: Internal Medicine

## 2018-10-11 ENCOUNTER — Telehealth: Payer: Self-pay | Admitting: Internal Medicine

## 2018-10-11 ENCOUNTER — Ambulatory Visit: Payer: Medicare HMO | Admitting: Internal Medicine

## 2018-10-11 VITALS — BP 130/86 | HR 80 | Ht 63.0 in | Wt 251.8 lb

## 2018-10-11 DIAGNOSIS — F17210 Nicotine dependence, cigarettes, uncomplicated: Secondary | ICD-10-CM

## 2018-10-11 DIAGNOSIS — Z77090 Contact with and (suspected) exposure to asbestos: Secondary | ICD-10-CM

## 2018-10-11 DIAGNOSIS — B351 Tinea unguium: Secondary | ICD-10-CM | POA: Diagnosis not present

## 2018-10-11 DIAGNOSIS — L309 Dermatitis, unspecified: Secondary | ICD-10-CM | POA: Diagnosis not present

## 2018-10-11 DIAGNOSIS — D229 Melanocytic nevi, unspecified: Secondary | ICD-10-CM | POA: Diagnosis not present

## 2018-10-11 DIAGNOSIS — R69 Illness, unspecified: Secondary | ICD-10-CM | POA: Diagnosis not present

## 2018-10-11 DIAGNOSIS — J449 Chronic obstructive pulmonary disease, unspecified: Secondary | ICD-10-CM | POA: Diagnosis not present

## 2018-10-11 DIAGNOSIS — Z79899 Other long term (current) drug therapy: Secondary | ICD-10-CM | POA: Diagnosis not present

## 2018-10-11 DIAGNOSIS — B359 Dermatophytosis, unspecified: Secondary | ICD-10-CM | POA: Diagnosis not present

## 2018-10-11 NOTE — Progress Notes (Signed)
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 Pulmonary Consult    Pt needs pulmonary clearance for back surgery. He states his breathing is doing well and denies any respiratory co's at this time.   each am note some cough/ congestion/ gray brown x maybe a tbsp over the first hour, worst in winter. rec Stop smoking completely Prn saba  Did fine with back surgery and knee arthroscopy per Dr Tonita Cong       07/24/2016  f/u ov/Daniel Ball re: COPD GOLD 0 / improved sob on  bevespi bid and saba biw avg  Chief Complaint  Patient presents with  . Follow-up    Coughing less and his breathing has improved. He uses albuterol inhaler 2 x per wk on average.   treadmill x 10-15 min x 2.5 mph x daily no grade whereas was a MMRC before bevespi added last ov  MMRC1 = can walk nl pace, flat grade, can't hurry or go uphills or steps s sob rec Bevespi or Anoro or Stiolto are the best choice for now for your breathing  - check your drug FORMULARY and return to see Tammy NP for a sample of trial Only use your albuterol as a rescue medication The key is to stop smoking completely before smoking completely stops you!     02/03/2018  f/u ov/Daniel Ball re: cough [name] sob still smoking with GOLD 0 copd prev maint on bevespi  Chief Complaint  Patient presents with  . Acute Visit    Increased cough over the past 3-4 months. His cough is prod with brown to green sputum.  He states also having increased DOE- gets out of breath walking to his mailbox.  He has an albuterol inhaler that he uses 3 x per wk on average.    gadually worse x 3-4 m Dyspnea:  mb is flat x 200 ft does not need to stop, both hips also limiting  MMRC3 = can't walk 100 yards even at a slow pace at a flat grade s stopping due to sob   Cough: worse in ams when tends to be most green, worse since sob worse  Sleep: one pillow  SABA use:  slt increase over baseline   omeprazole 20 x 2 daily but not ac and having overt hb  rec zpak  Work on inhaler technique:   The key is to stop smoking completely before smoking completely stops you!  Please remember to go to the  x-ray department downstairs in the basement  for your tests - we will call you with the results when they are available.  Return in 2 weeks to see Tammy NP with your drug formulary in hand to pick an alternative to bevespi > all over 100 dollars so she rec ipatropium tid    05/20/2018  f/u ov/Daniel Ball re:  Copd 0/ still smoking Chief Complaint  Patient presents with  . Follow-up    Breathing is unchanged. He is coughin more over the past wk or so- mainly at night. He is coughing  up some clear sputum. Since started atrovent nebs he rarely needs albuterol inhaler.   Dyspnea:  MMRC3 = can't walk 100 yards even at a slow pace at a flat grade s stopping due to sob   Cough: worse hs and in am x one week > clear mucus / stuffy nose Sleeping: 30 degrees adj bed  SABA use: avg once every 2 weeks and ipatropium neb tid  02: none   rec Prednisone 10 mg take  4 each am x 2 days,   2 each am x 2 days,  1 each am x 2 days and stop  You can use the ipratroprium up to 4 x daily if nebulizer is handy Only use your albuterol as a rescue medication Keep working on the smoking cessation - it's the most important aspect of your care!!    08/22/2018  f/u ov/Daniel Ball re: copd gold 0 / still smoking no maint rx  Chief Complaint  Patient presents with  . Follow-up    follows for COPD. uses albuterol a few times a week. feels like breathing is getting worse, gets short winded easy. decreased smoking  to 10 cigarettes daily.   Dyspnea: MMRC3 = can't walk 100 yards even at a slow pace at a flat grade s stopping due to sob / legs hurting esp R hip Struggles to daughter's house and back, min elevation Cough: first thing am/ just to clear throat thick white < tsp  Sleeping: 10 degrees adj bed/ 1 pillow SABA use: not much at all   rec Please see patient coordinator before you leave today  to schedule High resolution CT chest Please remember to go to the lab department   for your tests - we will call you with the results when they are available. Work on stopping smoking and weight loss with Dr Darden Dates help     10/11/2018  f/u ov/Daniel Ball re:  GOLD 0 copd/ still smoking  / ? NSIP  Chief Complaint  Patient presents with  . Follow-up    breathing good   Dyspnea:  slt improve ex tol / walking neighborhoods ok  - can't do treadmill due to hip Cough: just in am thick white Sleeping: 10 degrees electric bed SABA use: none  02: none    No obvious day to day or daytime variability or assoc excess/ purulent sputum or mucus plugs or hemoptysis or cp or chest tightness, subjective wheeze or overt sinus or hb symptoms.   Sleeps ok as above without nocturnal    exacerbation  of respiratory  c/o's or need for noct saba. Also denies any obvious fluctuation of symptoms with weather or environmental changes or other aggravating or alleviating factors except as outlined above   No unusual exposure hx or h/o childhood pna/ asthma or knowledge of premature birth.  Current Allergies, Complete Past Medical History, Past Surgical History, Family History, and Social History were reviewed in [employer] record.  ROS  The following are not active complaints unless bolded Hoarseness, sore throat, dysphagia, dental problems, itching, sneezing,  nasal congestion or discharge of excess mucus or purulent secretions, ear ache,   fever, chills, sweats, unintended wt loss or wt gain, classically  pleuritic or exertional cp,  orthopnea pnd or arm/hand swelling  or leg swelling, presyncope, palpitations, abdominal pain, anorexia, nausea, vomiting, diarrhea  or change in bowel habits or change in bladder habits, change in stools or change in urine, dysuria, hematuria,  rash, arthralgias, visual complaints, headache, numbness, weakness  or ataxia or problems with walking or coordination,  change in mood or  memory.        Current Meds  Medication Sig  . albuterol (PROVENTIL HFA;VENTOLIN HFA) 108 (90 Base) MCG/ACT inhaler Inhale 2 puffs into the lungs every 6 (six) hours as needed for wheezing or shortness of breath.  [name] buPROPion (WELLBUTRIN XL) 150 MG 24 hr tablet Take 1 tablet by mouth daily.  . cyclobenzaprine (FLEXERIL) 10 MG tablet Take 10 mg by mouth 3 (three) times daily as needed for muscle spasms.  [name] losartan (COZAAR) 100 MG tablet Take 100 mg by mouth daily.  [name] omeprazole (PRILOSEC) 20 MG capsule Take 20 mg by mouth daily.  . rosuvastatin (CRESTOR) 10 MG tablet Take 10 mg by mouth daily.                     Objective:   Physical Exam     amb obese wm mod gruff voice   10/11/2018    251   08/22/2018   254   05/20/2018  240     01/09/14 244 lb (110.678 kg)  07/07/10 254 lb (115.214 kg)  05/02/10 252 lb (114.306 kg)  baseline 220 / 230 around 2010     HEENT: Edentulous oropharynx. Nl external ear canals without cough reflex -  Mild bilateral non-specific turbinate edema   Modified Mallampati Score =   3   HEENT: Edentulous with nl  turbinates bilaterally - oropharynx with Modified Mallampati Score =   3 . Nl external ear canals without cough reflex   NECK :  without JVD/Nodes/TM/ nl carotid upstrokes bilaterally   LUNGS: no acc muscle use,  Nl contour chest which is clear to A and P bilaterally without cough on insp or exp maneuvers   CV:  RRR  no s3 or murmur or increase in P2, and no edema   ABD: quite obese but  soft and nontender with limited inspiratory  excursion in the supine position. No bruits or organomegaly appreciated, bowel sounds nl  MS:  Nl gait/ ext warm without deformities, calf tenderness, cyanosis or clubbing No obvious joint restrictions   SKIN: warm and dry without lesions    NEURO:  alert, approp, nl sensorium with  no motor or cerebellar deficits apparent.         I personally reviewed images and agree with radiology impression as follows:   HRCT 09/08/18 1. Spectrum of findings compatible with fibrotic interstitial lung disease. The prominent ground-glass component, absence of apicobasilar gradient, absence of honeycombing and absence of progression since 2017 high-resolution chest CT study all support a diagnosis of fibrotic phase nonspecific interstitial lung disease (NSIP).   2. New 6 mm apical right upper lobe solid pulmonary nodule, indeterminate, for which follow-up chest CT is advised in 3-6 months in this high risk patient. 3. Three-vessel coronary atherosclerosis. 4. Mild mediastinal adenopathy is stable since 2017, most compatible with benign reactive adenopathy. 5. Small hiatal hernia.            Assessment & Plan:

## 2018-10-11 NOTE — Patient Instructions (Signed)
We will call you to set up your follow up your follow up CT scan in June 2020   Call in meantime if losing ground with exercise tolerance or oxygen level while exercsing (Pulse oximetry)

## 2018-10-12 ENCOUNTER — Encounter: Payer: Self-pay | Admitting: Internal Medicine

## 2018-10-12 NOTE — Assessment & Plan Note (Signed)
Counseled re importance of smoking cessation but did not meet time criteria for separate billing   °

## 2018-10-12 NOTE — Assessment & Plan Note (Signed)
Active smoker 01/30/2016  try Bevespi 2bid  - PFT's  07/24/2016  FEV1 2.42 ( 86  % ) ratio 78  p 9 % improvement from saba p nothing  prior to study with DLCO  67 % corrects to 89 % for alv volume   - PFT's  08/22/2018  FEV1 2.21 (80 % ) ratio 74  p 0 % improvement from saba p nothing prior to study with DLCO  60 % corrects to 80 % for alv volume   - 08/22/2018   Walked RA  2 laps @ 267ft each @ brisk pace  stopped due to  End of study, no desat, legs stopped before sob   Patient is considered at risk for COPD but note the absence of significant emphysema on CT scanning.  He does have an element of asthmatic bronchitis typical of a group a patient and so continue to use albuterol on an as-needed basis.  10/11/2018  After extensive coaching inhaler device,  effectiveness =    75%     I had an extended discussion with the patient reviewing all relevant studies completed to date and  lasting 15 to 20 minutes of a 25 minute visit    See device teaching which extended face to face time for this visit.  Each maintenance medication was reviewed in detail including emphasizing most importantly the difference between maintenance and prns and under what circumstances the prns are to be triggered using an action plan format that is not reflected in the computer generated alphabetically organized AVS which I have not found useful in most complex patients, especially with respiratory illnesses  Please see AVS for specific instructions unique to this visit that I personally wrote and verbalized to the the pt in detail and then reviewed with pt  by my nurse highlighting any  changes in therapy recommended at today's visit to their plan of care.

## 2018-10-12 NOTE — Assessment & Plan Note (Addendum)
HRCT 07/20/2016 1. Pulmonary parenchymal pattern of upper/mid lung zone predominant ground-glass coarsening and subpleural reticulation, unchanged from 07/06/2014 and suggestive of nonspecific interstitial pneumonitis. HRCT 09/08/18 1. Spectrum of findings compatible with fibrotic interstitial lung disease. The prominent ground-glass component, absence of apicobasilar gradient, absence of honeycombing and absence of progression since 2017 high-resolution chest CT study all support a diagnosis of fibrotic phase nonspecific interstitial lung disease (NSIP). Findings are suggestive of an alternative diagnosis (not UIP) per consensus guidelines: Diagnosis of Idiopathic Pulmonary Fibrosis: An Official ATS/ERS/JRS/ALAT Clinical Practice Guideline. [place], Iss 5, [id number], May 22 2017. 2. New 6 mm apical right upper lobe solid pulmonary nodule, indeterminate, for which follow-up chest CT is advised in 3-6 months in this high risk patient. 3. Three-vessel coronary atherosclerosis. 4. Mild mediastinal adenopathy is stable since 2017, most compatible with benign reactive adenopathy. 5. Small hiatal hernia. 10/11/2018   Walked RA  2 laps @  approx 262ft each @ fast pace  stopped due to  Legs hurting s sob or desats    Discussed the above findings in the context of the risk of lung cancer and progressive interstitial fibrosis due to continued smoking AGAINST MEDICAL ADVICE.  In the mean time I explained to him the best way to monitor this problem is by checking his saturations with activity using an apples to apples comparison so he needs to be consistent about what ever it is that he is doing aerobically and track oxygen saturations at the end of this activity and let us know if there is any reduction over time otherwise we can see him back in 6 months.

## 2018-10-18 NOTE — Telephone Encounter (Signed)
ERROR

## 2018-10-21 DIAGNOSIS — J449 Chronic obstructive pulmonary disease, unspecified: Secondary | ICD-10-CM | POA: Diagnosis not present

## 2018-11-09 DIAGNOSIS — R7303 Prediabetes: Secondary | ICD-10-CM | POA: Diagnosis not present

## 2018-11-09 DIAGNOSIS — Z1159 Encounter for screening for other viral diseases: Secondary | ICD-10-CM | POA: Diagnosis not present

## 2018-11-09 DIAGNOSIS — J449 Chronic obstructive pulmonary disease, unspecified: Secondary | ICD-10-CM | POA: Diagnosis not present

## 2018-11-09 DIAGNOSIS — H903 Sensorineural hearing loss, bilateral: Secondary | ICD-10-CM | POA: Diagnosis not present

## 2018-11-09 DIAGNOSIS — K219 Gastro-esophageal reflux disease without esophagitis: Secondary | ICD-10-CM | POA: Diagnosis not present

## 2018-11-09 DIAGNOSIS — Z0000 Encounter for general adult medical examination without abnormal findings: Secondary | ICD-10-CM | POA: Diagnosis not present

## 2018-11-09 DIAGNOSIS — D696 Thrombocytopenia, unspecified: Secondary | ICD-10-CM | POA: Diagnosis not present

## 2018-11-09 DIAGNOSIS — F329 Major depressive disorder, single episode, unspecified: Secondary | ICD-10-CM | POA: Diagnosis not present

## 2018-11-09 DIAGNOSIS — K52832 Lymphocytic colitis: Secondary | ICD-10-CM | POA: Diagnosis not present

## 2018-11-09 DIAGNOSIS — E782 Mixed hyperlipidemia: Secondary | ICD-10-CM | POA: Diagnosis not present

## 2018-11-09 DIAGNOSIS — Z125 Encounter for screening for malignant neoplasm of prostate: Secondary | ICD-10-CM | POA: Diagnosis not present

## 2018-11-09 DIAGNOSIS — I10 Essential (primary) hypertension: Secondary | ICD-10-CM | POA: Diagnosis not present

## 2018-11-09 DIAGNOSIS — I25118 Atherosclerotic heart disease of native coronary artery with other forms of angina pectoris: Secondary | ICD-10-CM | POA: Diagnosis not present

## 2018-11-14 ENCOUNTER — Other Ambulatory Visit: Payer: Self-pay | Admitting: Family Medicine

## 2018-11-14 DIAGNOSIS — Z136 Encounter for screening for cardiovascular disorders: Secondary | ICD-10-CM

## 2018-11-15 ENCOUNTER — Ambulatory Visit
Admission: RE | Admit: 2018-11-15 | Discharge: 2018-11-15 | Disposition: A | Discharge status: Home / Self Care | Payer: Medicare Other | Source: Ambulatory Visit | Attending: Family Medicine | Admitting: Family Medicine

## 2018-11-15 DIAGNOSIS — Z87891 Personal history of nicotine dependence: Secondary | ICD-10-CM | POA: Diagnosis not present

## 2018-11-15 DIAGNOSIS — Z136 Encounter for screening for cardiovascular disorders: Secondary | ICD-10-CM

## 2018-11-15 DIAGNOSIS — Z13820 Encounter for screening for osteoporosis: Secondary | ICD-10-CM | POA: Diagnosis not present

## 2018-11-21 DIAGNOSIS — L309 Dermatitis, unspecified: Secondary | ICD-10-CM | POA: Diagnosis not present

## 2019-02-20 ENCOUNTER — Telehealth: Payer: Self-pay | Admitting: *Deleted

## 2019-02-20 DIAGNOSIS — R059 Cough, unspecified: Secondary | ICD-10-CM

## 2019-02-20 DIAGNOSIS — Z77090 Contact with and (suspected) exposure to asbestos: Secondary | ICD-10-CM

## 2019-02-20 DIAGNOSIS — R05 Cough: Secondary | ICD-10-CM

## 2019-02-20 NOTE — Telephone Encounter (Signed)
Patient is returning [place] call.  Patient phone number is [phone].

## 2019-02-20 NOTE — Telephone Encounter (Signed)
LMTCB

## 2019-02-20 NOTE — Telephone Encounter (Signed)
Spoke with the pt and notified of recs per MW  He verbalized understanding  Order sent to [facility]

## 2019-02-20 NOTE — Telephone Encounter (Signed)
-----   Message from Tanda Rockers, MD sent at 10/12/2018  9:45 AM EST ----- Needs ct s contrast and ov with me same day by end of the month (preferable ct before lunch and ov after so we can discuss it)

## 2019-03-09 ENCOUNTER — Telehealth: Payer: Self-pay

## 2019-03-09 NOTE — Telephone Encounter (Signed)
Called and spoke with pt letting him know the info from MW in regards to the CT scan MW had said for pt to get done and pt verbalized understanding. Nothing further needed.

## 2019-03-09 NOTE — Telephone Encounter (Signed)
F/u is for nodule, not ILD , so  Plain cxr s contrast is what's indicated and paid for by insurance

## 2019-03-09 NOTE — Telephone Encounter (Signed)
Spoke with pt. He has a pending CT on 03/13/2019. This has been ordered as a regular CT without. Pt is questioning if it needs to be a HRCT instead, his last CT was a HRCT.  MW - please advise. Thanks.

## 2019-03-13 ENCOUNTER — Other Ambulatory Visit: Payer: Self-pay

## 2019-03-13 ENCOUNTER — Ambulatory Visit (HOSPITAL_COMMUNITY)
Admission: RE | Admit: 2019-03-13 | Discharge: 2019-03-13 | Disposition: A | Discharge status: Home / Self Care | Payer: Medicare Other | Source: Ambulatory Visit | Attending: Internal Medicine | Admitting: Internal Medicine

## 2019-03-13 ENCOUNTER — Telehealth: Payer: Self-pay

## 2019-03-13 ENCOUNTER — Ambulatory Visit (HOSPITAL_COMMUNITY): Payer: Medicare Other

## 2019-03-13 DIAGNOSIS — R05 Cough: Secondary | ICD-10-CM | POA: Diagnosis not present

## 2019-03-13 DIAGNOSIS — R059 Cough, unspecified: Secondary | ICD-10-CM

## 2019-03-13 DIAGNOSIS — J439 Emphysema, unspecified: Secondary | ICD-10-CM | POA: Diagnosis not present

## 2019-03-13 DIAGNOSIS — Z77090 Contact with and (suspected) exposure to asbestos: Secondary | ICD-10-CM | POA: Insufficient documentation

## 2019-03-13 DIAGNOSIS — I700 Atherosclerosis of aorta: Secondary | ICD-10-CM | POA: Diagnosis not present

## 2019-03-13 NOTE — Telephone Encounter (Signed)
Call report for CT, suspicious 8 mm nodule. . MW please advise.

## 2019-03-13 NOTE — Telephone Encounter (Signed)
Pt to be seen 03/14/19 will advise then

## 2019-03-13 NOTE — Telephone Encounter (Signed)
Ok thanks 

## 2019-03-14 ENCOUNTER — Encounter: Payer: Self-pay | Admitting: Internal Medicine

## 2019-03-14 ENCOUNTER — Ambulatory Visit (INDEPENDENT_AMBULATORY_CARE_PROVIDER_SITE_OTHER): Payer: Medicare Other | Admitting: Internal Medicine

## 2019-03-14 DIAGNOSIS — R911 Solitary pulmonary nodule: Secondary | ICD-10-CM | POA: Diagnosis not present

## 2019-03-14 DIAGNOSIS — Z77090 Contact with and (suspected) exposure to asbestos: Secondary | ICD-10-CM

## 2019-03-14 DIAGNOSIS — F17210 Nicotine dependence, cigarettes, uncomplicated: Secondary | ICD-10-CM | POA: Diagnosis not present

## 2019-03-14 DIAGNOSIS — J449 Chronic obstructive pulmonary disease, unspecified: Secondary | ICD-10-CM | POA: Diagnosis not present

## 2019-03-14 NOTE — Patient Instructions (Addendum)
To get the most out of exercise, you need to be continuously aware that you are short of breath, but never out of breath, for 30 minutes daily. As you improve, it will actually be easier for you to do the same amount of exercise  in  30 minutes so always push to the level where you are short of breath.  If possible, monitor your 02 sats with a pulse oximeter to track your progress over time   The key is to stop smoking completely before smoking completely stops you!  Change prilosec to where you take it  Take 30-60 min before first meal of the day   GERD (REFLUX)  is an extremely common cause of respiratory symptoms just like yours , many times with no obvious heartburn at all.    It can be treated with medication, but also with lifestyle changes including elevation of the head of your bed (ideally with 6 -8inch blocks under the headboard of your bed),  Smoking cessation, avoidance of late meals, excessive alcohol, and avoid fatty foods, chocolate, peppermint, colas, red wine, and acidic juices such as orange juice.  NO MINT OR MENTHOL PRODUCTS SO NO COUGH DROPS  USE SUGARLESS CANDY INSTEAD (Jolley ranchers or Stover's or Life Savers) or even ice chips will also do - the key is to swallow to prevent all throat clearing. NO OIL BASED VITAMINS - use powdered substitutes.  Avoid fish oil when coughing.    Please schedule a follow up visit in 3 months but call sooner if needed - we will call prior to schedule your ct first

## 2019-03-14 NOTE — Progress Notes (Signed)
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 Pulmonary Consult    Pt needs pulmonary clearance for back surgery. He states his breathing is doing well and denies any respiratory co's at this time.   each am note some cough/ congestion/ gray brown x maybe a tbsp over the first hour, worst in winter. rec Stop smoking completely Prn saba  Did fine with back surgery and knee arthroscopy per Daniel Ball      08/22/2018  f/u ov/Daniel Ball re: copd gold 0 / still smoking no maint rx  Chief Complaint  Patient presents with  . Follow-up    follows for COPD. uses albuterol a few times a week. feels like breathing is getting worse, gets short winded easy. decreased smoking to 10 cigarettes daily.   Dyspnea: MMRC3 = can't walk 100 yards even at a slow pace at a flat grade s stopping due to sob / legs hurting esp R hip Struggles to daughter's house and back, min elevation Cough: first thing am/ just to clear throat thick white < tsp  Sleeping: 10 degrees adj bed/ 1 pillow SABA use: not much at all   rec Please see patient coordinator before you leave today  to schedule High resolution CT chest Please remember to go to the lab department   for your tests - we will call you with the results when they are available. Work on stopping smoking and weight loss with Daniel Ball help     10/11/2018  f/u ov/Daniel Ball re:  GOLD 0 copd/ still smoking  / ? NSIP  Chief Complaint  Patient presents with  . Follow-up    breathing good   Dyspnea:  slt improve ex tol / walking neighborhoods ok  - can't do treadmill due  to hip Cough: just in am thick white Sleeping: 10 degrees electric bed SABA use: none  02: none  rec Monitor sats with ex     03/14/2019  f/u ov/Daniel Ball re: copd gold 0/ still smoking /? nsip  Chief Complaint  Patient presents with  . Follow-up    Review CT Chest  Dyspnea:  gxt x 12 min x  2.5 mph x flat x  Not checking sats as rec Cough: some at hs and in am x 20 min thick white Sleeping: flat/ two pillows SABA use: rarely  02: none   No obvious day to day or daytime variability or assoc excess/ purulent sputum or mucus plugs or hemoptysis or cp or chest tightness, subjective wheeze or overt sinus or hb symptoms.   Sleeping as above without nocturnal  or early am exacerbation  of respiratory  c/o's or need for noct saba. Also denies any obvious fluctuation of symptoms with weather or environmental changes or other aggravating or alleviating factors except as outlined above   No unusual exposure hx or h/o childhood pna/ asthma or knowledge of premature birth.  Current Allergies, Complete Past Medical History, Past Surgical History, Family History, and Social History were reviewed in [employer] record.  ROS  The following are not active complaints unless bolded Hoarseness, sore throat, dysphagia, dental problems, itching, sneezing,  nasal congestion or discharge of excess mucus or purulent secretions, ear ache,   fever, chills, sweats, unintended wt loss or wt gain, classically pleuritic or exertional cp,  orthopnea pnd or arm/hand swelling  or leg swelling, presyncope, palpitations, abdominal pain, anorexia, nausea, vomiting, diarrhea  or change in bowel habits or change in bladder habits, change in stools or change in urine, dysuria, hematuria,  rash, arthralgias, visual complaints, headache, numbness, weakness or ataxia or problems with walking or coordination,  change in mood or  memory.        Current Meds  Medication Sig  . albuterol (PROVENTIL  HFA;VENTOLIN HFA) 108 (90 Base) MCG/ACT inhaler Inhale 2 puffs into the lungs every 6 (six) hours as needed for wheezing or shortness of breath.  [name] buPROPion (WELLBUTRIN XL) 150 MG 24 hr tablet Take 1 tablet by mouth daily.  . cyclobenzaprine (FLEXERIL) 10 MG tablet Take 10 mg by mouth 3 (three) times daily as needed for muscle spasms.  [name] losartan (COZAAR) 100 MG tablet Take 100 mg by mouth daily.  [name] omeprazole (PRILOSEC) 20 MG capsule Take 20 mg by mouth daily.  . rosuvastatin (CRESTOR) 10 MG tablet Take 10 mg by mouth daily.                     Objective:   Physical Exam    amb obese wm nad     03/14/2019    242  10/11/2018    251   08/22/2018   254   05/20/2018  240     01/09/14 244 lb (110.678 kg)  07/07/10 254 lb (115.214 kg)  05/02/10 252 lb (114.306 kg)  baseline 220 / 230 around 2010     Vital signs reviewed - Note on arrival 02 sats  97% on RA       HEENT: Edentulous/ nl  turbinates bilaterally, and oropharynx. Nl external ear canals without cough reflex = Modified Mallampati Score =   3    NECK :  without JVD/Nodes/TM/ nl carotid upstrokes bilaterally   LUNGS: no acc muscle use,  Nl contour chest which is clear to A and P bilaterally without cough on insp or exp maneuvers   CV:  RRR  no s3 or murmur or increase in P2, and no edema   ABD:  soft and nontender with nl inspiratory excursion in the supine position. No bruits or organomegaly appreciated, bowel sounds nl  MS:  Nl gait/ ext warm without deformities, calf tenderness, cyanosis or clubbing No obvious joint restrictions   SKIN: warm and dry without lesions    NEURO:  alert, approp, nl sensorium with  no motor or cerebellar deficits apparent.         I personally reviewed images and agree with radiology impression as follows:   Chest CT s contrast  03/13/19 CT chest 03/13/2019 New irregular subpleural 8 mm nodule in the right upper lobe is moderately suspicious for developing bronchogenic  carcinoma. Non-contrast chest CT at 6-12 months is recommended (6 months favored).  2. Stable chronic lung disease consistent with a combination emphysema nonspecific interstitial pneumonitis.        Assessment & Plan:

## 2019-03-19 ENCOUNTER — Encounter: Payer: Self-pay | Admitting: Internal Medicine

## 2019-03-19 DIAGNOSIS — C3490 Malignant neoplasm of unspecified part of unspecified bronchus or lung: Secondary | ICD-10-CM | POA: Insufficient documentation

## 2019-03-19 DIAGNOSIS — R911 Solitary pulmonary nodule: Secondary | ICD-10-CM | POA: Insufficient documentation

## 2019-03-19 NOTE — Assessment & Plan Note (Signed)
Active smoker 01/30/2016  try Bevespi 2bid  - PFT's  07/24/2016  FEV1 2.42 ( 86  % ) ratio 78  p 9 % improvement from saba p nothing  prior to study with DLCO  67 % corrects to 89 % for alv volume   - PFT's  08/22/2018  FEV1 2.21 (80 % ) ratio 74  p 0 % improvement from saba p nothing prior to study with DLCO  60 % corrects to 80 % for alv volume   - 08/22/2018   Walked RA  2 laps @ 251ft each @ brisk pace  stopped due to  End of study, no desat, legs stopped before sob      I reviewed the Fletcher curve with the patient that basically indicates  if you quit smoking when your best day FEV1 is still well preserved (as is clearly  the case here)  it is highly unlikely you will progress to severe disease and informed the patient there was  no medication on the market that has proven to alter the curve/ its downward trajectory  or the likelihood of progression of their disease(unlike other chronic medical conditions such as atheroclerosis where we do think we can change the natural hx with risk reducing meds)    Therefore stopping smoking and maintaining abstinence are  the most important aspects of care, not choice of inhalers or for that matter, doctors.   Treatment other than smoking cessation  is entirely directed by severity of symptoms and focused also on reducing exacerbations, not attempting to change the natural history of the disease.    >>> Since not really limited by doe or having aecopd ok to just use saba prn as this is c/w GOLD group A criteria.

## 2019-03-19 NOTE — Assessment & Plan Note (Signed)
HRCT 07/20/2016 1. Pulmonary parenchymal pattern of upper/mid lung zone predominant ground-glass coarsening and subpleural reticulation, unchanged from 07/06/2014 and suggestive of nonspecific interstitial pneumonitis. HRCT 09/08/18 1. Spectrum of findings compatible with fibrotic interstitial lung disease. The prominent ground-glass component, absence of apicobasilar gradient, absence of honeycombing and absence of progression since 2017 high-resolution chest CT study all support a diagnosis of fibrotic phase nonspecific interstitial lung disease (NSIP). Findings are suggestive of an alternative diagnosis (not UIP) per consensus guidelines: Diagnosis of Idiopathic Pulmonary Fibrosis: An Official ATS/ERS/JRS/ALAT Clinical Practice Guideline. [place], Iss 5, [phone], May 22 2017. 2. New 6 mm apical right upper lobe solid pulmonary nodule, indeterminate, for which follow-up chest CT is advised in 3-6 months in this high risk patient. 3. Three-vessel coronary atherosclerosis. 4. Mild mediastinal adenopathy is stable since 2017, most compatible with benign reactive adenopathy. 5. Small hiatal hernia. 10/11/2018   Walked RA  2 laps @  approx 266ft each @ fast pace  stopped due to  Legs hurting s sob or desats  CT chest 03/13/2019 New irregular subpleural 8 mm nodule in the right upper lobe is moderately suspicious for developing bronchogenic carcinoma. Non-contrast chest CT at 6-12 months is recommended (6 months Favored).>  See spn   2. Stable chronic lung disease consistent with a combination emphysema nonspecific interstitial pneumonitis.  >>> No evidence of progressive PF or classic asbestosis at this point.

## 2019-03-19 NOTE — Assessment & Plan Note (Addendum)
See CT chest 03/13/19  RUL x 8 mm > for recall 06/13/19    CT results reviewed with pt >>> Too small for PET or bx, not suspicious enough for excisional bx > really only option for now is follow the Fleischner society guidelines as rec by radiology.   Discussed in detail all the  indications, usual  risks and alternatives  relative to the benefits with patient who agrees to proceed with w/u as outlined.      I had an extended discussion with the patient reviewing all relevant studies completed to date and  lasting 15 to 20 minutes of a 25 minute visit    Each maintenance medication was reviewed in detail including most importantly the difference between maintenance and prns and under what circumstances the prns are to be triggered using an action plan format that is not reflected in the computer generated alphabetically organized AVS.     Please see AVS for specific instructions unique to this visit that I personally wrote and verbalized to the the pt in detail and then reviewed with pt  by my nurse highlighting any  changes in therapy recommended at today's visit to their plan of care.

## 2019-03-19 NOTE — Assessment & Plan Note (Addendum)
4-5 min discussion re active cigarette smoking in addition to office E&M  Ask about tobacco use:   Ongoing  Advise quitting  I took an extended  opportunity with this patient to outline the consequences of continued cigarette use  in airway disorders based on all the data we have from the multiple national lung health studies (perfomed over decades at millions of dollars in cost)  indicating that smoking cessation, not choice of inhalers or physicians, is the most important aspect of care.   Assess willingness:  Not committed at this point Assist in quit attempt:  Per PCP when ready Arrange follow up:   Follow up per Primary Care planned  For smoking cessation classes call [phone]

## 2019-03-19 NOTE — Assessment & Plan Note (Signed)
Body mass index is 41.54 kg/m.  -  trending down/ congratulated Lab Results  Component Value Date   TSH 2.22 08/22/2018     Contributing to gerd risk/ doe/reviewed the need and the process to achieve and maintain neg calorie balance > defer f/u primary care including intermittently monitoring thyroid status

## 2019-05-10 DIAGNOSIS — E782 Mixed hyperlipidemia: Secondary | ICD-10-CM | POA: Diagnosis not present

## 2019-05-10 DIAGNOSIS — F329 Major depressive disorder, single episode, unspecified: Secondary | ICD-10-CM | POA: Diagnosis not present

## 2019-05-10 DIAGNOSIS — K219 Gastro-esophageal reflux disease without esophagitis: Secondary | ICD-10-CM | POA: Diagnosis not present

## 2019-05-10 DIAGNOSIS — R7303 Prediabetes: Secondary | ICD-10-CM | POA: Diagnosis not present

## 2019-05-10 DIAGNOSIS — I10 Essential (primary) hypertension: Secondary | ICD-10-CM | POA: Diagnosis not present

## 2019-05-10 DIAGNOSIS — I25118 Atherosclerotic heart disease of native coronary artery with other forms of angina pectoris: Secondary | ICD-10-CM | POA: Diagnosis not present

## 2019-05-10 DIAGNOSIS — D696 Thrombocytopenia, unspecified: Secondary | ICD-10-CM | POA: Diagnosis not present

## 2019-05-10 DIAGNOSIS — F17200 Nicotine dependence, unspecified, uncomplicated: Secondary | ICD-10-CM | POA: Diagnosis not present

## 2019-05-10 DIAGNOSIS — H903 Sensorineural hearing loss, bilateral: Secondary | ICD-10-CM | POA: Diagnosis not present

## 2019-05-10 DIAGNOSIS — R911 Solitary pulmonary nodule: Secondary | ICD-10-CM | POA: Diagnosis not present

## 2019-05-10 DIAGNOSIS — J449 Chronic obstructive pulmonary disease, unspecified: Secondary | ICD-10-CM | POA: Diagnosis not present

## 2019-05-10 DIAGNOSIS — K52832 Lymphocytic colitis: Secondary | ICD-10-CM | POA: Diagnosis not present

## 2019-05-19 DIAGNOSIS — D696 Thrombocytopenia, unspecified: Secondary | ICD-10-CM | POA: Diagnosis not present

## 2019-05-19 DIAGNOSIS — R7303 Prediabetes: Secondary | ICD-10-CM | POA: Diagnosis not present

## 2019-05-19 DIAGNOSIS — E782 Mixed hyperlipidemia: Secondary | ICD-10-CM | POA: Diagnosis not present

## 2019-05-31 ENCOUNTER — Other Ambulatory Visit: Payer: Self-pay | Admitting: Internal Medicine

## 2019-05-31 DIAGNOSIS — R911 Solitary pulmonary nodule: Secondary | ICD-10-CM

## 2019-06-05 ENCOUNTER — Telehealth: Payer: Self-pay | Admitting: Internal Medicine

## 2019-06-05 ENCOUNTER — Ambulatory Visit (HOSPITAL_COMMUNITY)
Admission: RE | Admit: 2019-06-05 | Discharge: 2019-06-05 | Disposition: A | Discharge status: Home / Self Care | Payer: Medicare Other | Source: Ambulatory Visit | Attending: Internal Medicine | Admitting: Internal Medicine

## 2019-06-05 ENCOUNTER — Other Ambulatory Visit: Payer: Self-pay

## 2019-06-05 DIAGNOSIS — R911 Solitary pulmonary nodule: Secondary | ICD-10-CM | POA: Insufficient documentation

## 2019-06-05 NOTE — Telephone Encounter (Signed)
I called pt last week and left him vm about CT appt and he called me back.  I haven't left him any other messages.  Called pt today and he just wanted to know if CT has been precerted.  Explained to him Medicare does not require precert.  He states ok.  Nothing further needed.

## 2019-06-08 ENCOUNTER — Encounter: Payer: Self-pay | Admitting: Internal Medicine

## 2019-06-08 ENCOUNTER — Other Ambulatory Visit: Payer: Self-pay

## 2019-06-08 ENCOUNTER — Ambulatory Visit (INDEPENDENT_AMBULATORY_CARE_PROVIDER_SITE_OTHER): Payer: Medicare Other | Admitting: Internal Medicine

## 2019-06-08 DIAGNOSIS — R911 Solitary pulmonary nodule: Secondary | ICD-10-CM | POA: Diagnosis not present

## 2019-06-08 DIAGNOSIS — J449 Chronic obstructive pulmonary disease, unspecified: Secondary | ICD-10-CM | POA: Diagnosis not present

## 2019-06-08 DIAGNOSIS — F17210 Nicotine dependence, cigarettes, uncomplicated: Secondary | ICD-10-CM

## 2019-06-08 NOTE — Patient Instructions (Addendum)
The key is to stop smoking completely before smoking completely stops you!   Please schedule a follow up visit in 6 months but call sooner if needed    

## 2019-06-08 NOTE — Progress Notes (Signed)
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 Pulmonary Consult    Pt needs pulmonary clearance for back surgery. He states his breathing is doing well and denies any respiratory co's at this time.   each am note some cough/ congestion/ gray brown x maybe a tbsp over the first hour, worst in winter. rec Stop smoking completely Prn saba  Did fine with back surgery and knee arthroscopy per Dr Tonita Cong      08/22/2018  f/u ov/[name] re: copd gold 0 / still smoking no maint rx  Chief Complaint  Patient presents with  . Follow-up    follows for COPD. uses albuterol a few times a week. feels like breathing is getting worse, gets short winded easy. decreased smoking to 10 cigarettes daily.   Dyspnea: MMRC3 = can't walk 100 yards even at a slow pace at a flat grade s stopping due to sob / legs hurting esp R hip Struggles to daughter's house and back, min elevation Cough: first thing am/ just to clear throat thick white < tsp  Sleeping: 10 degrees adj bed/ 1 pillow SABA use: not much at all   rec Please see patient coordinator before you leave today  to schedule High resolution CT chest Please remember to go to the lab department   for your tests - we will call you with the results when they are available. Work on stopping smoking and weight loss with Dr Darden Dates help     10/11/2018  f/u ov/Alajiah Dutkiewicz re:  GOLD 0 copd/ still smoking  / ? NSIP  Chief Complaint  Patient presents with  . Follow-up    breathing good   Dyspnea:  slt improve ex tol / walking neighborhoods ok  - can't do treadmill due  to hip Cough: just in am thick white Sleeping: 10 degrees electric bed SABA use: none  02: none  rec Monitor sats with ex     03/14/2019  f/u ov/[name] re: copd gold 0/ still smoking /? nsip  Chief Complaint  Patient presents with  . Follow-up    Review CT Chest  Dyspnea:  gxt x 12 min x  2.5 mph x flat x  Not checking sats as rec Cough: some at hs and in am x 20 min thick white Sleeping: flat/ two pillows SABA use: rarely  02: none rec To get the most out of exercise, you need to be continuously aware that you are short of breath  The key is to stop smoking completely before smoking completely stops you! Change prilosec to where you take it  Take 30-60 min before first meal of the day  GERD diet   06/08/2019  f/u ov/[name] re: GOLD 0/ still smoking no maint rx  Chief Complaint  Patient presents with  . Follow-up    Breathing is unchanged. He feels tired often. He is not using his albuterol inhaler.   Dyspnea:  200 yards to daughter's house slt uphill back to house and has to lean on the fence on the  way back Cough: mostly p cigs/ min mucoid  Sleeping: adustable bed, 10 degrees SABA use: rarely 02: none    No obvious day to day or daytime variability or assoc excess/ purulent sputum or mucus plugs or hemoptysis or cp or chest tightness, subjective wheeze or overt sinus or hb symptoms.   Sleeping fine  without nocturnal  or early am exacerbation  of respiratory  c/o's or need for noct saba. Also denies any obvious fluctuation of symptoms with weather or environmental changes or other aggravating or alleviating factors except as outlined above   No unusual exposure hx or h/o childhood pna/ asthma or knowledge of premature birth.  Current Allergies, Complete Past Medical History, Past Surgical History, Family History, and Social History were reviewed in [employer] record.  ROS  The following are not active complaints unless bolded Hoarseness, sore  throat, dysphagia, dental problems, itching, sneezing,  nasal congestion or discharge of excess mucus or purulent secretions, ear ache,   fever, chills, sweats, unintended wt loss or wt gain, classically pleuritic or exertional cp,  orthopnea pnd or arm/hand swelling  or leg swelling, presyncope, palpitations, abdominal pain, anorexia, nausea, vomiting, diarrhea  or change in bowel habits or change in bladder habits, change in stools or change in urine, dysuria, hematuria,  rash, arthralgias, visual complaints, headache, numbness, weakness or ataxia or problems with walking or coordination,  change in mood or  memory.        Current Meds  Medication Sig  . albuterol (PROVENTIL HFA;VENTOLIN HFA) 108 (90 Base) MCG/ACT inhaler Inhale 2 puffs into the lungs every 6 (six) hours as needed for wheezing or shortness of breath.  [name] buPROPion (WELLBUTRIN XL) 150 MG 24 hr tablet Take 1 tablet by mouth daily.  . cyclobenzaprine (FLEXERIL) 10 MG tablet Take 10 mg by mouth 3 (three) times daily as needed for muscle spasms.  [name] losartan (COZAAR) 100 MG tablet Take 100 mg by mouth daily.  [name] omeprazole (PRILOSEC) 20 MG capsule Take 20 mg by mouth daily.  . rosuvastatin (CRESTOR) 10 MG tablet Take 10 mg by mouth daily.             Objective:   Physical Exam    amb obese wm nad     06/08/2019    252  03/14/2019    242  10/11/2018    251   08/22/2018   254   05/20/2018  240     01/09/14 244 lb (110.678 kg)  07/07/10 254 lb (115.214 kg)  05/02/10 252 lb (114.306 kg)  baseline 220 / 230 around 2010    Vital signs reviewed - Note on arrival 02 sats  98% on RA     Reports: Edentulous     HEENT : pt wearing mask not removed for exam due to covid -19 concerns.    NECK :  without JVD/Nodes/TM/ nl carotid upstrokes bilaterally   LUNGS: no acc muscle use,  Nl contour chest  With junky coarse mostly upper airway rhonchi bilaterally without cough on insp or exp maneuvers   CV:  RRR  no s3 or murmur or  increase in P2, and no edema   ABD: quit obese  soft and nontender with nl inspiratory excursion in the supine position. No bruits or organomegaly appreciated, bowel sounds nl  MS:  Nl gait/ ext warm without deformities, calf tenderness, cyanosis or clubbing No obvious joint restrictions   SKIN: warm and dry without lesions    NEURO:  alert, approp, nl sensorium with  no motor or cerebellar deficits apparent.              Assessment & Plan:

## 2019-06-09 ENCOUNTER — Encounter: Payer: Self-pay | Admitting: Internal Medicine

## 2019-06-09 NOTE — Assessment & Plan Note (Signed)
4-5 min discussion re active cigarette smoking in addition to office E&M  Ask about tobacco use:   ongoing Advise quitting   I took an extended  opportunity with this patient to outline the consequences of continued cigarette use  in airway disorders based on all the data we have from the multiple national lung health studies (perfomed over decades at millions of dollars in cost)  indicating that smoking cessation, not choice of inhalers or physicians, is the most important aspect of care.   Assess willingness:  Not committed at this point Assist in quit attempt:  Per PCP when ready Arrange follow up:   Follow up per Primary Care planned

## 2019-06-09 NOTE — Assessment & Plan Note (Signed)
Body mass index is 41.93 kg/m.  -  trending up  Lab Results  Component Value Date   TSH 2.22 08/22/2018     Contributing to gerd risk/ doe/reviewed the need and the process to achieve and maintain neg calorie balance > defer f/u primary care including intermittently monitoring thyroid status

## 2019-06-09 NOTE — Assessment & Plan Note (Signed)
Active smoker 01/30/2016  try Bevespi 2bid > not really any better so pt d/cd - PFT's  07/24/2016  FEV1 2.42 ( 86  % ) ratio 78  p 9 % improvement from saba p nothing  prior to study with DLCO  67 % corrects to 89 % for alv volume   - PFT's  08/22/2018  FEV1 2.21 (80 % ) ratio 74  p 0 % improvement from saba p nothing prior to study with DLCO  60 % corrects to 80 % for alv volume   - 08/22/2018   Walked RA  2 laps @ 233ft each @ brisk pace  stopped due to  End of study, no desat, legs stopped before sob   He actually doesn't even use saba at this point so no need to offer re-escalation of care esp since did not respond convincingly to bevespi   Reviewed: I spent extra time with pt today reviewing appropriate use of albuterol for prn use on exertion with the following points: 1) saba is for relief of sob that does not improve by walking a slower pace or resting but rather if the pt does not improve after trying this first. 2) If the pt is convinced, as many are, that saba helps recover from activity faster then it's easy to tell if this is the case by re-challenging : ie stop, take the inhaler, then p 5 minutes try the exact same activity (intensity of workload) that just caused the symptoms and see if they are substantially diminished or not after saba 3) if there is an activity that reproducibly causes the symptoms, try the saba 15 min before the activity on alternate days   If in fact the saba really does help, then fine to continue to use it prn but advised may need to look closer at a new maint regimen to achieve better control of airways disease with exertion.

## 2019-07-03 DIAGNOSIS — Z23 Encounter for immunization: Secondary | ICD-10-CM | POA: Diagnosis not present

## 2019-07-18 ENCOUNTER — Other Ambulatory Visit: Payer: Self-pay | Admitting: Dermatology

## 2019-07-18 DIAGNOSIS — L409 Psoriasis, unspecified: Secondary | ICD-10-CM | POA: Diagnosis not present

## 2019-07-18 DIAGNOSIS — B351 Tinea unguium: Secondary | ICD-10-CM | POA: Diagnosis not present

## 2019-07-18 DIAGNOSIS — D485 Neoplasm of uncertain behavior of skin: Secondary | ICD-10-CM | POA: Diagnosis not present

## 2019-07-18 DIAGNOSIS — B359 Dermatophytosis, unspecified: Secondary | ICD-10-CM | POA: Diagnosis not present

## 2019-09-14 ENCOUNTER — Telehealth: Payer: Self-pay | Admitting: Internal Medicine

## 2019-09-14 NOTE — Telephone Encounter (Signed)
If Dr. Melvyn Novas wanted the patient to do another CT from 9/17 visit there was no new CT order placed. I have looked at his note from that day 9/17 and it doesn't state to do another CT. Would someone else please check behind me

## 2019-09-14 NOTE — Telephone Encounter (Signed)
Dr. Melvyn Novas, please advise on this. Thanks!

## 2019-09-15 NOTE — Assessment & Plan Note (Signed)
See CT chest 03/13/19  RUL x 8 mm  -repeat  CT chest  06/05/19 1. Nodule of concern in the periphery of the right upper lobe seen on the prior examination has slightly regressed, suggesting a benign etiology. Likewise, the other nodule in the inferior aspect of the right upper lobe abutting the minor fissure is also stable. Follow-up noncontrast chest CT is recommended in 12 months to ensure continued stability of these findings. 2. There is a spectrum of findings in the lungs concerning for interstitial lung disease, similar to the prior study. Findings appear stable compared to the prior examination, and at this time are categorized as indeterminate for usual interstitial pneumonia (UIP) per current ATS guidelines. The overall appearance favors nonspecific interstitial pneumonia   >>> placed in reminder file for recall 06/04/2020  Discussed in detail all the  indications, usual  risks and alternatives  relative to the benefits with patient who agrees to proceed with f/u as outlined  / rec under fleischner society guidelines

## 2019-09-15 NOTE — Telephone Encounter (Signed)
The entry was in the overview box from the last ov instead of in the assessment :   nodules were smaller but radiology rec was for one year  >>> placed in reminder file for recall 06/04/2020  So no additional ct scans needed now

## 2019-09-18 ENCOUNTER — Ambulatory Visit: Payer: Medicare Other | Admitting: Internal Medicine

## 2019-09-18 NOTE — Telephone Encounter (Signed)
Patient called back. Advised him of MW's response, he verbalized understanding. Also made him aware of his next follow up with MW. He verbalized understanding. Nothing further needed at time of call.

## 2019-09-18 NOTE — Telephone Encounter (Signed)
Left message for patient to call back  

## 2019-10-05 DIAGNOSIS — R7303 Prediabetes: Secondary | ICD-10-CM | POA: Diagnosis not present

## 2019-10-05 DIAGNOSIS — R0989 Other specified symptoms and signs involving the circulatory and respiratory systems: Secondary | ICD-10-CM | POA: Diagnosis not present

## 2019-10-05 DIAGNOSIS — F17200 Nicotine dependence, unspecified, uncomplicated: Secondary | ICD-10-CM | POA: Diagnosis not present

## 2019-10-05 DIAGNOSIS — I25118 Atherosclerotic heart disease of native coronary artery with other forms of angina pectoris: Secondary | ICD-10-CM | POA: Diagnosis not present

## 2019-10-05 DIAGNOSIS — J849 Interstitial pulmonary disease, unspecified: Secondary | ICD-10-CM | POA: Diagnosis not present

## 2019-10-05 DIAGNOSIS — G4730 Sleep apnea, unspecified: Secondary | ICD-10-CM | POA: Diagnosis not present

## 2019-10-05 DIAGNOSIS — I10 Essential (primary) hypertension: Secondary | ICD-10-CM | POA: Diagnosis not present

## 2019-10-05 DIAGNOSIS — E782 Mixed hyperlipidemia: Secondary | ICD-10-CM | POA: Diagnosis not present

## 2019-10-30 ENCOUNTER — Other Ambulatory Visit: Payer: Self-pay

## 2019-10-30 ENCOUNTER — Encounter: Payer: Self-pay | Admitting: Internal Medicine

## 2019-10-30 ENCOUNTER — Other Ambulatory Visit (HOSPITAL_COMMUNITY)
Admission: RE | Admit: 2019-10-30 | Discharge: 2019-10-30 | Disposition: A | Discharge status: Home / Self Care | Payer: Medicare Other | Source: Ambulatory Visit | Attending: Internal Medicine | Admitting: Internal Medicine

## 2019-10-30 ENCOUNTER — Other Ambulatory Visit (HOSPITAL_COMMUNITY)
Admission: RE | Admit: 2019-10-30 | Discharge: 2019-10-30 | Disposition: A | Discharge status: Home / Self Care | Payer: Medicare Other | Source: Ambulatory Visit | Attending: Cardiology | Admitting: Cardiology

## 2019-10-30 ENCOUNTER — Telehealth: Payer: Self-pay | Admitting: Internal Medicine

## 2019-10-30 ENCOUNTER — Ambulatory Visit (INDEPENDENT_AMBULATORY_CARE_PROVIDER_SITE_OTHER): Payer: Medicare Other | Admitting: Internal Medicine

## 2019-10-30 VITALS — BP 142/81 | HR 80 | Temp 98.1°F | Ht 65.0 in | Wt 252.0 lb

## 2019-10-30 DIAGNOSIS — I25110 Atherosclerotic heart disease of native coronary artery with unstable angina pectoris: Secondary | ICD-10-CM | POA: Diagnosis not present

## 2019-10-30 DIAGNOSIS — Z01818 Encounter for other preprocedural examination: Secondary | ICD-10-CM | POA: Diagnosis not present

## 2019-10-30 DIAGNOSIS — F17200 Nicotine dependence, unspecified, uncomplicated: Secondary | ICD-10-CM | POA: Diagnosis not present

## 2019-10-30 DIAGNOSIS — G4733 Obstructive sleep apnea (adult) (pediatric): Secondary | ICD-10-CM | POA: Diagnosis not present

## 2019-10-30 DIAGNOSIS — E785 Hyperlipidemia, unspecified: Secondary | ICD-10-CM | POA: Diagnosis not present

## 2019-10-30 DIAGNOSIS — R0600 Dyspnea, unspecified: Secondary | ICD-10-CM | POA: Diagnosis not present

## 2019-10-30 DIAGNOSIS — K219 Gastro-esophageal reflux disease without esophagitis: Secondary | ICD-10-CM | POA: Diagnosis not present

## 2019-10-30 DIAGNOSIS — M1990 Unspecified osteoarthritis, unspecified site: Secondary | ICD-10-CM | POA: Diagnosis not present

## 2019-10-30 DIAGNOSIS — Z7982 Long term (current) use of aspirin: Secondary | ICD-10-CM | POA: Diagnosis not present

## 2019-10-30 DIAGNOSIS — Z20822 Contact with and (suspected) exposure to covid-19: Secondary | ICD-10-CM | POA: Insufficient documentation

## 2019-10-30 DIAGNOSIS — Z6841 Body Mass Index (BMI) 40.0 and over, adult: Secondary | ICD-10-CM | POA: Diagnosis not present

## 2019-10-30 DIAGNOSIS — I2584 Coronary atherosclerosis due to calcified coronary lesion: Secondary | ICD-10-CM | POA: Diagnosis not present

## 2019-10-30 DIAGNOSIS — J449 Chronic obstructive pulmonary disease, unspecified: Secondary | ICD-10-CM | POA: Diagnosis not present

## 2019-10-30 DIAGNOSIS — Z79899 Other long term (current) drug therapy: Secondary | ICD-10-CM | POA: Diagnosis not present

## 2019-10-30 DIAGNOSIS — I10 Essential (primary) hypertension: Secondary | ICD-10-CM | POA: Diagnosis not present

## 2019-10-30 DIAGNOSIS — I200 Unstable angina: Secondary | ICD-10-CM | POA: Diagnosis present

## 2019-10-30 LAB — BASIC METABOLIC PANEL
Anion gap: 9 (ref 5–15)
BUN: 14 mg/dL (ref 8–23)
CO2: 29 mmol/L (ref 22–32)
Calcium: 9.4 mg/dL (ref 8.9–10.3)
Chloride: 101 mmol/L (ref 98–111)
Creatinine, Ser: 1.13 mg/dL (ref 0.61–1.24)
GFR calc Af Amer: >60 mL/min (ref >60)
GFR calc non Af Amer: >60 mL/min (ref >60)
Glucose, Bld: 100 mg/dL — ABNORMAL HIGH (ref 70–99)
Potassium: 4.4 mmol/L (ref 3.5–5.1)
Sodium: 139 mmol/L (ref 135–145)

## 2019-10-30 LAB — CBC
HCT: 48.1 % (ref 39.0–52.0)
Hemoglobin: 15.9 g/dL (ref 13.0–17.0)
MCH: 31.3 pg (ref 26.0–34.0)
MCHC: 33.1 g/dL (ref 30.0–36.0)
MCV: 94.7 fL (ref 80.0–100.0)
Platelets: 152 10*3/uL (ref 150–400)
RBC: 5.08 MIL/uL (ref 4.22–5.81)
RDW: 12.5 % (ref 11.5–15.5)
WBC: 8.6 10*3/uL (ref 4.0–10.5)
nRBC: 0 % (ref 0.0–0.2)

## 2019-10-30 LAB — SARS CORONAVIRUS 2 (TAT 6-24 HRS): SARS Coronavirus 2: NEGATIVE

## 2019-10-30 NOTE — Patient Instructions (Signed)
Medication Instructions:  Your physician recommends that you continue on your current medications as directed. Please refer to the Current Medication list given to you today.  *If you need a refill on your cardiac medications before your next appointment, please call your pharmacy*  Lab Work: Your physician recommends that you return for lab work in: Today   If you have labs (blood work) drawn today and your tests are completely normal, you will receive your results only by: [name] MyChart Message (if you have MyChart) OR . A paper copy in the mail If you have any lab test that is abnormal or we need to change your treatment, we will call you to review the results.  Testing/Procedures: Your physician has requested that you have a cardiac catheterization. Cardiac catheterization is used to diagnose and/or treat various heart conditions. Doctors may recommend this procedure for a number of different reasons. The most common reason is to evaluate chest pain. Chest pain can be a symptom of coronary artery disease (CAD), and cardiac catheterization can show whether plaque is narrowing or blocking your heart's arteries. This procedure is also used to evaluate the valves, as well as measure the blood flow and oxygen levels in different parts of your heart. For further information please visit [email or web address]. Please follow instruction sheet, as given.    Follow-Up: At [facility], you and your health needs are our priority.  As part of our continuing mission to provide you with exceptional heart care, we have created designated Provider Care Teams.  These Care Teams include your primary Cardiologist (physician) and Advanced Practice Providers (APPs -  Physician Assistants and Nurse Practitioners) who all work together to provide you with the care you need, when you need it.  Your next appointment:    Pending Catheterization results.   The format for your next appointment:   In Person  Provider:    [name], MD  Other Instructions Thank you for choosing [facility]!     [place] [address] Dept: [phone] Loc: [place]  10/30/2019  You are scheduled for a Cardiac Catheterization on Thursday, February 11 with Dr. Glenetta Hew.  1. Please arrive at the [facility] (Main Entrance A) at [facility]: [address] [place], [place] [address] at 7:00 AM (This time is two hours before your procedure to ensure your preparation). Free valet parking service is available.   Special note: Every effort is made to have your procedure done on time. Please understand that emergencies sometimes delay scheduled procedures.  2. Diet: Do not eat solid foods after midnight.  The patient may have clear liquids until 5am upon the day of the procedure.  3. Labs: You will need to have blood drawn on Monday, February 8 at [place]. [address], [place]  Open: 7am - 6pm, Sat 8am - 12 noon   Phone: [phone]. You do not need to be fasting.  4. Medication instructions in preparation for your procedure:   Contrast Allergy: No  On the morning of your procedure, take your Aspirin and any morning medicines NOT listed above.  You may use sips of water.  5. Plan for one night stay--bring personal belongings. 6. Bring a current list of your medications and current insurance cards. 7. You MUST have a responsible person to drive you home. 8. Someone MUST be with you the first 24 hours  after you arrive home or your discharge will be delayed. 9. Please wear clothes that are easy to get on and off and wear slip-on shoes.  Thank you for allowing [place] to care for you!   -- [facility]

## 2019-10-30 NOTE — Telephone Encounter (Signed)
  Precert needed for:  R/L heart cath [facility] 2/11 [facility]

## 2019-10-30 NOTE — H&P (View-Only) (Signed)
Cardiology Office Note   Date:  10/30/2019   ID:  Daniel, Ball [birth year], MRN [id number]  PCP:  [name], MD  Cardiologist:   [name], MD   Patient was referred by Dr. Dema Severin for continued cardiac care, following his coronary artery disease.    History of Present Illness: Daniel Ball is a 64 y.o. male with a history of HTN, COPD, tobacco use, reported OSA  and CAD    He is followed by Dr Dema Severin at [facility].  He had previously seen Thurman Coyer   He had not been seen for 5 years   The pt had cardiac cath in 2005 that showed mild to moderate dz.  Stress cardiolite done in 2009   No ischemia detected but pt did not reach targe HR.   The pt says he has noticed a progressive SOB / DOE   Giving out much easier than he did in the past. Some chest pressure   He cut back on smoking  Down to 1/2 pack  Thought symptoms would get better  Instead, they have gotten worse  This past Saturday was bad   he was working with his arms cutting brush outside  The pt says he had to stop every 5 min because of dyspnea, that he  would just give out.  After resting he  would go back to work   Finally at [employer] PM he was exhausted and he went to bed and slept til  Sunday AM  The patient says in the past he was able to do this work bought land to do this work.  It has become harder and harder.  Again he has cut back on tobacco and thought things would not get worse  He has some chest pressure more right-sided.  He denies PND.  Gets short of breath walking up inclines okay at 2.5 mph on treadmill       Current Meds  Medication Sig  . aspirin EC 81 MG tablet Take 81 mg by mouth daily.  [name] buPROPion (WELLBUTRIN XL) 300 MG 24 hr tablet Take 300 mg by mouth daily.  . cyclobenzaprine (FLEXERIL) 10 MG tablet Take 10 mg by mouth 3 (three) times daily as needed for muscle spasms.  [name] omeprazole (PRILOSEC) 20 MG capsule Take 20 mg by mouth daily.  . rosuvastatin (CRESTOR) 10 MG tablet Take 10 mg by mouth  daily.  . valsartan (DIOVAN) 320 MG tablet Take 320 mg by mouth daily.     Allergies:   Patient has no known allergies.   Past Medical History:  Diagnosis Date  . Arthritis   . COPD (chronic obstructive pulmonary disease) ([place])   . Coronary artery disease   . GERD (gastroesophageal reflux disease)   . Hyperlipidemia   . Hypertension    hx HBP - MEDS DC'D 10 YRS since BP has been in normal range  . Impingement syndrome of shoulder    left  . Shortness of breath    with exertion  . Sleep apnea    couldnt tolerate CPAP  . Spinal stenosis     Past Surgical History:  Procedure Laterality Date  . EYE SURGERY Left   . HAND SURGERY Right    thumb  . LUMBAR LAMINECTOMY/DECOMPRESSION MICRODISCECTOMY N/A 02/07/2014   Procedure: LUMBAR DECOMPRESSION Lumbar one-Lumbar five;  Surgeon: [name], MD;  Location: [facility];  Service: Orthopedics;  Laterality: N/A;  . SHOULDER ARTHROSCOPY WITH SUBACROMIAL DECOMPRESSION Left 04/12/2014  Procedure: LEFT SHOULDER ARTHROSCOPY WITH SUBACROMIAL DECOMPRESSION AND LABRAL DEBRIDEMENT, ROTATOR CUFF DEBRIDEMENT, BICEPS DEBRIDEMENT;  Surgeon: [name], MD;  Location: [facility];  Service: Orthopedics;  Laterality: Left;     Social History:  The patient  reports that he has been smoking cigarettes. He has a 135.00 pack-year smoking history. He has never used smokeless tobacco. He reports that he does not drink alcohol or use drugs.   Family History:  The patient's family history is not on file.    ROS:  Please see the history of present illness. All other systems are reviewed and  Negative to the above problem except as noted.    PHYSICAL EXAM: VS:  BP (!) 141/72 (BP Location: Right Leg)   Pulse 80   Temp 98.1 F (36.7 C) (Temporal)   Ht 5\' 5"  (1.651 m)   SpO2 96%   BMI 41.93 kg/m   GEN: Morbidly obese in no acute distress  HEENT: normal  Neck: no JVD, carotid bruits Cardiac: RRR; no murmurs, rubs, or gallops,no edema 1+ posterior  tibial pulses Respiratory: Moving air but mild wheezes bilaterally no rales  GI: soft, nontender, distended because of obesity MS: no deformity Moving all extremities   Skin: warm and dry, no rash Neuro:  Strength and sensation are intact Psych: euthymic mood, full affect   EKG:  EKG is not ordered today.   Lipid Panel No results found for: CHOL, TRIG, HDL, CHOLHDL, VLDL, LDLCALC, LDLDIRECT    Wt Readings from Last 3 Encounters:  06/08/19 252 lb (114.3 kg)  03/14/19 242 lb (109.8 kg)  10/11/18 251 lb 12.8 oz (114.2 kg)      ASSESSMENT AND PLAN:  1  Dyspnea.  Patient has a history of coronary artery disease as well as a history of tobacco use.  He is followed by Clois Comber in pulmonary clinic.  Last PFTs in 2019 do not show severe COPD.  He has had progressive dyspnea now and some chest tightness concerning is just is giving out especially when he is doing aerobic activity with his arms.  Again progressive.  His weight is up but it has not changed in years.  I reviewed this with the patient I do think he needs a coronary evaluation.  I would recommend either a CT scan or a cardiac catheterization.  Discussed both benefits and risks of both.  The patient would like to get back to working and wants to find out what is going on.  The cardiac catheterization would recommend a right and left heart catheterization to measure pulmonary pressures as well as a look at his coronary arteries.  The risk and benefits of the cardiac PET catheterization were explained.  The patient understands and agrees to proceed with this plan as he wants to find out and move forward  2 CAD as above.  Plan on heart catheterization  3 hypertension fair control.  Keep on same regimen for now  4 hyperlipidemia patient is on Crestor will need to get labs for review  5 history of tobacco use.  Will need counseling.  6.  Question sleep apnea.  Patient is a little unclear on this diagnosis.  Will need to  review.  Follow-up will be based on test results.0 15   Current medicines are reviewed at length with the patient today.  The patient does not have concerns regarding medicines.  Signed, [name], MD  10/30/2019 8:46 AM    [facility]  [address], [place], [place]  [address] Phone: [phone]; Fax: [phone]

## 2019-10-30 NOTE — Progress Notes (Signed)
Cardiology Office Note   Date:  10/30/2019   ID:  Daniel Ball, Daniel Ball Jul 30, 1956, MRN [id number]  PCP:  [name], MD  Cardiologist:   [name], MD   Patient was referred by Dr. Dema Severin for continued cardiac care, following his coronary artery disease.    History of Present Illness: Daniel Ball is a 64 y.o. male with a history of HTN, COPD, tobacco use, reported OSA  and CAD    He is followed by Dr Dema Severin at [facility].  He had previously seen Thurman Coyer   He had not been seen for 5 years   The pt had cardiac cath in 2005 that showed mild to moderate dz.  Stress cardiolite done in 2009   No ischemia detected but pt did not reach targe HR.   The pt says he has noticed a progressive SOB / DOE   Giving out much easier than he did in the past. Some chest pressure   He cut back on smoking  Down to 1/2 pack  Thought symptoms would get better  Instead, they have gotten worse  This past Saturday was bad   he was working with his arms cutting brush outside  The pt says he had to stop every 5 min because of dyspnea, that he  would just give out.  After resting he  would go back to work   Finally at [employer] PM he was exhausted and he went to bed and slept til  Sunday AM  The patient says in the past he was able to do this work bought land to do this work.  It has become harder and harder.  Again he has cut back on tobacco and thought things would not get worse  He has some chest pressure more right-sided.  He denies PND.  Gets short of breath walking up inclines okay at 2.5 mph on treadmill       Current Meds  Medication Sig  . aspirin EC 81 MG tablet Take 81 mg by mouth daily.  [name] buPROPion (WELLBUTRIN XL) 300 MG 24 hr tablet Take 300 mg by mouth daily.  . cyclobenzaprine (FLEXERIL) 10 MG tablet Take 10 mg by mouth 3 (three) times daily as needed for muscle spasms.  [name] omeprazole (PRILOSEC) 20 MG capsule Take 20 mg by mouth daily.  . rosuvastatin (CRESTOR) 10 MG tablet Take 10 mg by mouth  daily.  . valsartan (DIOVAN) 320 MG tablet Take 320 mg by mouth daily.     Allergies:   Patient has no known allergies.   Past Medical History:  Diagnosis Date  . Arthritis   . COPD (chronic obstructive pulmonary disease) ([place])   . Coronary artery disease   . GERD (gastroesophageal reflux disease)   . Hyperlipidemia   . Hypertension    hx HBP - MEDS DC'D 10 YRS since BP has been in normal range  . Impingement syndrome of shoulder    left  . Shortness of breath    with exertion  . Sleep apnea    couldnt tolerate CPAP  . Spinal stenosis     Past Surgical History:  Procedure Laterality Date  . EYE SURGERY Left   . HAND SURGERY Right    thumb  . LUMBAR LAMINECTOMY/DECOMPRESSION MICRODISCECTOMY N/A 02/07/2014   Procedure: LUMBAR DECOMPRESSION Lumbar one-Lumbar five;  Surgeon: [name], MD;  Location: [facility];  Service: Orthopedics;  Laterality: N/A;  . SHOULDER ARTHROSCOPY WITH SUBACROMIAL DECOMPRESSION Left 04/12/2014  Procedure: LEFT SHOULDER ARTHROSCOPY WITH SUBACROMIAL DECOMPRESSION AND LABRAL DEBRIDEMENT, ROTATOR CUFF DEBRIDEMENT, BICEPS DEBRIDEMENT;  Surgeon: [name], MD;  Location: [facility];  Service: Orthopedics;  Laterality: Left;     Social History:  The patient  reports that he has been smoking cigarettes. He has a 135.00 pack-year smoking history. He has never used smokeless tobacco. He reports that he does not drink alcohol or use drugs.   Family History:  The patient's family history is not on file.    ROS:  Please see the history of present illness. All other systems are reviewed and  Negative to the above problem except as noted.    PHYSICAL EXAM: VS:  BP (!) 141/72 (BP Location: Right Leg)   Pulse 80   Temp 98.1 F (36.7 C) (Temporal)   Ht 5\' 5"  (1.651 m)   SpO2 96%   BMI 41.93 kg/m   GEN: Morbidly obese in no acute distress  HEENT: normal  Neck: no JVD, carotid bruits Cardiac: RRR; no murmurs, rubs, or gallops,no edema 1+ posterior  tibial pulses Respiratory: Moving air but mild wheezes bilaterally no rales  GI: soft, nontender, distended because of obesity MS: no deformity Moving all extremities   Skin: warm and dry, no rash Neuro:  Strength and sensation are intact Psych: euthymic mood, full affect   EKG:  EKG is not ordered today.   Lipid Panel No results found for: CHOL, TRIG, HDL, CHOLHDL, VLDL, LDLCALC, LDLDIRECT    Wt Readings from Last 3 Encounters:  06/08/19 252 lb (114.3 kg)  03/14/19 242 lb (109.8 kg)  10/11/18 251 lb 12.8 oz (114.2 kg)      ASSESSMENT AND PLAN:  1  Dyspnea.  Patient has a history of coronary artery disease as well as a history of tobacco use.  He is followed by Clois Comber in pulmonary clinic.  Last PFTs in 2019 do not show severe COPD.  He has had progressive dyspnea now and some chest tightness concerning is just is giving out especially when he is doing aerobic activity with his arms.  Again progressive.  His weight is up but it has not changed in years.  I reviewed this with the patient I do think he needs a coronary evaluation.  I would recommend either a CT scan or a cardiac catheterization.  Discussed both benefits and risks of both.  The patient would like to get back to working and wants to find out what is going on.  The cardiac catheterization would recommend a right and left heart catheterization to measure pulmonary pressures as well as a look at his coronary arteries.  The risk and benefits of the cardiac PET catheterization were explained.  The patient understands and agrees to proceed with this plan as he wants to find out and move forward  2 CAD as above.  Plan on heart catheterization  3 hypertension fair control.  Keep on same regimen for now  4 hyperlipidemia patient is on Crestor will need to get labs for review  5 history of tobacco use.  Will need counseling.  6.  Question sleep apnea.  Patient is a little unclear on this diagnosis.  Will need to  review.  Follow-up will be based on test results.0 15   Current medicines are reviewed at length with the patient today.  The patient does not have concerns regarding medicines.  Signed, [name], MD  10/30/2019 8:46 AM    [facility]  [address], [place], [place]  [address] Phone: [phone]; Fax: [phone]

## 2019-10-31 ENCOUNTER — Encounter: Payer: Self-pay | Admitting: *Deleted

## 2019-11-01 ENCOUNTER — Telehealth: Payer: Self-pay | Admitting: *Deleted

## 2019-11-01 NOTE — Telephone Encounter (Addendum)
Pt contacted pre-catheterization scheduled at [facility] for: Thursday February 11,2021 9 AM Verified arrival time and place: [facility]) at: 7 AM   No solid food after midnight prior to cath, clear liquids until 5 AM day of procedure. Contrast allergy: no   AM meds can be  taken pre-cath with sip of water including: ASA 81 mg   Confirmed patient has responsible adult to drive home post procedure and observe 24 hours after arriving home: yes  Currently, due to Covid-19 pandemic, only one person will be allowed with patient. Must be the same person for patient's entire stay and will be required to wear a mask. They will be asked to wait in the waiting room for the duration of the patient's stay.  Patients are required to wear a mask when they enter the hospital.      COVID-19 Pre-Screening Questions:  . In the past 7 to 10 days have you had a cough,  shortness of breath, headache, congestion, fever (100 or greater) body aches, chills, sore throat, or sudden loss of taste or sense of smell? no   I reviewed procedure, mask, visitor instructions with patient, he verbalized understanding, thanked me for call.

## 2019-11-02 ENCOUNTER — Encounter (HOSPITAL_COMMUNITY)
Admission: RE | Disposition: A | Discharge status: Home / Self Care | Payer: Medicare Other | Source: Home / Self Care | Attending: Cardiology

## 2019-11-02 ENCOUNTER — Telehealth: Payer: Self-pay | Admitting: Cardiology

## 2019-11-02 ENCOUNTER — Ambulatory Visit (HOSPITAL_COMMUNITY)
Admission: RE | Admit: 2019-11-02 | Discharge: 2019-11-02 | Disposition: A | Discharge status: Home / Self Care | Payer: Medicare Other | Attending: Cardiology | Admitting: Cardiology

## 2019-11-02 ENCOUNTER — Other Ambulatory Visit: Payer: Self-pay

## 2019-11-02 DIAGNOSIS — Z20822 Contact with and (suspected) exposure to covid-19: Secondary | ICD-10-CM | POA: Diagnosis not present

## 2019-11-02 DIAGNOSIS — I10 Essential (primary) hypertension: Secondary | ICD-10-CM | POA: Insufficient documentation

## 2019-11-02 DIAGNOSIS — G4733 Obstructive sleep apnea (adult) (pediatric): Secondary | ICD-10-CM | POA: Insufficient documentation

## 2019-11-02 DIAGNOSIS — J449 Chronic obstructive pulmonary disease, unspecified: Secondary | ICD-10-CM | POA: Insufficient documentation

## 2019-11-02 DIAGNOSIS — R0600 Dyspnea, unspecified: Secondary | ICD-10-CM

## 2019-11-02 DIAGNOSIS — Z79899 Other long term (current) drug therapy: Secondary | ICD-10-CM | POA: Insufficient documentation

## 2019-11-02 DIAGNOSIS — Z6841 Body Mass Index (BMI) 40.0 and over, adult: Secondary | ICD-10-CM | POA: Insufficient documentation

## 2019-11-02 DIAGNOSIS — E785 Hyperlipidemia, unspecified: Secondary | ICD-10-CM | POA: Insufficient documentation

## 2019-11-02 DIAGNOSIS — I2584 Coronary atherosclerosis due to calcified coronary lesion: Secondary | ICD-10-CM | POA: Insufficient documentation

## 2019-11-02 DIAGNOSIS — K219 Gastro-esophageal reflux disease without esophagitis: Secondary | ICD-10-CM | POA: Insufficient documentation

## 2019-11-02 DIAGNOSIS — I4891 Unspecified atrial fibrillation: Secondary | ICD-10-CM

## 2019-11-02 DIAGNOSIS — I25110 Atherosclerotic heart disease of native coronary artery with unstable angina pectoris: Secondary | ICD-10-CM | POA: Diagnosis not present

## 2019-11-02 DIAGNOSIS — M1990 Unspecified osteoarthritis, unspecified site: Secondary | ICD-10-CM | POA: Insufficient documentation

## 2019-11-02 DIAGNOSIS — I200 Unstable angina: Secondary | ICD-10-CM | POA: Diagnosis present

## 2019-11-02 DIAGNOSIS — R0609 Other forms of dyspnea: Secondary | ICD-10-CM | POA: Diagnosis present

## 2019-11-02 DIAGNOSIS — Z7982 Long term (current) use of aspirin: Secondary | ICD-10-CM | POA: Insufficient documentation

## 2019-11-02 DIAGNOSIS — F17200 Nicotine dependence, unspecified, uncomplicated: Secondary | ICD-10-CM | POA: Insufficient documentation

## 2019-11-02 HISTORY — DX: Unspecified atrial fibrillation: I48.91

## 2019-11-02 HISTORY — PX: RIGHT/LEFT HEART CATH AND CORONARY ANGIOGRAPHY: CATH118266

## 2019-11-02 HISTORY — PX: INTRAVASCULAR PRESSURE WIRE/FFR STUDY: CATH118243

## 2019-11-02 LAB — POCT I-STAT EG7
Acid-Base Excess: 1 mmol/L (ref 0.0–2.0)
Bicarbonate: 27.1 mmol/L (ref 20.0–28.0)
Calcium, Ion: 1.18 mmol/L (ref 1.15–1.40)
HCT: 43 % (ref 39.0–52.0)
Hemoglobin: 14.6 g/dL (ref 13.0–17.0)
O2 Saturation: 67 %
Potassium: 4.1 mmol/L (ref 3.5–5.1)
Sodium: 142 mmol/L (ref 135–145)
TCO2: 29 mmol/L (ref 22–32)
pCO2, Ven: 50 mmHg (ref 44.0–60.0)
pH, Ven: 7.342 (ref 7.250–7.430)
pO2, Ven: 37 mmHg (ref 32.0–45.0)

## 2019-11-02 LAB — POCT I-STAT 7, (LYTES, BLD GAS, ICA,H+H)
Acid-Base Excess: 3 mmol/L — ABNORMAL HIGH (ref 0.0–2.0)
Bicarbonate: 29.4 mmol/L — ABNORMAL HIGH (ref 20.0–28.0)
Calcium, Ion: 1.21 mmol/L (ref 1.15–1.40)
HCT: 43 % (ref 39.0–52.0)
Hemoglobin: 14.6 g/dL (ref 13.0–17.0)
O2 Saturation: 99 %
Potassium: 4.2 mmol/L (ref 3.5–5.1)
Sodium: 141 mmol/L (ref 135–145)
TCO2: 31 mmol/L (ref 22–32)
pCO2 arterial: 51.5 mmHg — ABNORMAL HIGH (ref 32.0–48.0)
pH, Arterial: 7.365 (ref 7.350–7.450)
pO2, Arterial: 154 mmHg — ABNORMAL HIGH (ref 83.0–108.0)

## 2019-11-02 LAB — POCT ACTIVATED CLOTTING TIME
Activated Clotting Time: 285 seconds
Activated Clotting Time: 296 seconds

## 2019-11-02 SURGERY — RIGHT/LEFT HEART CATH AND CORONARY ANGIOGRAPHY
Anesthesia: LOCAL

## 2019-11-02 MED ORDER — LIDOCAINE HCL (PF) 1 % IJ SOLN
INTRAMUSCULAR | Status: AC
  Filled 2019-11-02: qty 60

## 2019-11-02 MED ORDER — SODIUM CHLORIDE 0.9 % WEIGHT BASED INFUSION: 1.0000 mL/kg/h | INTRAVENOUS | Status: DC

## 2019-11-02 MED ORDER — NITROGLYCERIN 1 MG/10 ML FOR IR/CATH LAB
INTRA_ARTERIAL | Status: DC | PRN
  Administered 2019-11-02: 100 ug via INTRACORONARY
  Administered 2019-11-02: 100 ug

## 2019-11-02 MED ORDER — SODIUM CHLORIDE 0.9% FLUSH: 3.0000 mL | INTRAVENOUS | Status: DC | PRN

## 2019-11-02 MED ORDER — SODIUM CHLORIDE 0.9% FLUSH: 3.0000 mL | Freq: Two times a day (BID) | INTRAVENOUS | Status: DC

## 2019-11-02 MED ORDER — ACETAMINOPHEN 325 MG PO TABS: 650.0000 mg | ORAL_TABLET | ORAL | Status: DC | PRN

## 2019-11-02 MED ORDER — VERAPAMIL HCL 2.5 MG/ML IV SOLN
INTRAVENOUS | Status: AC
  Filled 2019-11-02: qty 2

## 2019-11-02 MED ORDER — SODIUM CHLORIDE 0.9 % IV SOLN: INTRAVENOUS | Status: DC

## 2019-11-02 MED ORDER — NITROGLYCERIN 0.4 MG SL SUBL: 0.4000 mg | SUBLINGUAL_TABLET | SUBLINGUAL | 99 refills | Status: DC | PRN

## 2019-11-02 MED ORDER — HEPARIN (PORCINE) IN NACL 1000-0.9 UT/500ML-% IV SOLN
INTRAVENOUS | Status: DC | PRN
  Administered 2019-11-02 (×2): 500 mL

## 2019-11-02 MED ORDER — HYDRALAZINE HCL 20 MG/ML IJ SOLN: 10.0000 mg | INTRAMUSCULAR | Status: DC | PRN

## 2019-11-02 MED ORDER — APIXABAN 5 MG PO TABS: 5.0000 mg | ORAL_TABLET | Freq: Two times a day (BID) | ORAL | 0 refills | Status: DC

## 2019-11-02 MED ORDER — FENTANYL CITRATE (PF) 100 MCG/2ML IJ SOLN
INTRAMUSCULAR | Status: AC
  Filled 2019-11-02: qty 2

## 2019-11-02 MED ORDER — HEPARIN (PORCINE) IN NACL 1000-0.9 UT/500ML-% IV SOLN
INTRAVENOUS | Status: AC
  Filled 2019-11-02: qty 1500

## 2019-11-02 MED ORDER — IOHEXOL 350 MG/ML SOLN
INTRAVENOUS | Status: DC | PRN
  Administered 2019-11-02: 140 mL

## 2019-11-02 MED ORDER — HEPARIN SODIUM (PORCINE) 1000 UNIT/ML IJ SOLN
INTRAMUSCULAR | Status: DC | PRN
  Administered 2019-11-02: 6000 [IU] via INTRAVENOUS
  Administered 2019-11-02: 5000 [IU] via INTRAVENOUS
  Administered 2019-11-02: 2000 [IU] via INTRAVENOUS

## 2019-11-02 MED ORDER — SODIUM CHLORIDE 0.9 % IV SOLN: 250.0000 mL | INTRAVENOUS | Status: DC | PRN

## 2019-11-02 MED ORDER — METOPROLOL SUCCINATE ER 25 MG PO TB24: 12.5000 mg | ORAL_TABLET | Freq: Every day | ORAL | 0 refills | Status: DC

## 2019-11-02 MED ORDER — LIDOCAINE HCL (PF) 1 % IJ SOLN
INTRAMUSCULAR | Status: DC | PRN
  Administered 2019-11-02 (×2): 2 mL

## 2019-11-02 MED ORDER — METOPROLOL SUCCINATE ER 25 MG PO TB24: 12.5000 mg | ORAL_TABLET | Freq: Every day | ORAL | 5 refills | Status: DC

## 2019-11-02 MED ORDER — VERAPAMIL HCL 2.5 MG/ML IV SOLN
INTRAVENOUS | Status: DC | PRN
  Administered 2019-11-02: 10 mL via INTRA_ARTERIAL

## 2019-11-02 MED ORDER — ASPIRIN 81 MG PO CHEW: 81.0000 mg | CHEWABLE_TABLET | ORAL | Status: DC

## 2019-11-02 MED ORDER — LABETALOL HCL 5 MG/ML IV SOLN: 10.0000 mg | INTRAVENOUS | Status: DC | PRN

## 2019-11-02 MED ORDER — MIDAZOLAM HCL 2 MG/2ML IJ SOLN
INTRAMUSCULAR | Status: AC
  Filled 2019-11-02: qty 2

## 2019-11-02 MED ORDER — FENTANYL CITRATE (PF) 100 MCG/2ML IJ SOLN
INTRAMUSCULAR | Status: DC | PRN
  Administered 2019-11-02: 25 ug via INTRAVENOUS
  Administered 2019-11-02: 50 ug via INTRAVENOUS

## 2019-11-02 MED ORDER — SODIUM CHLORIDE 0.9 % WEIGHT BASED INFUSION
3.0000 mL/kg/h | INTRAVENOUS | Status: AC
  Administered 2019-11-02: 3 mL/kg/h via INTRAVENOUS

## 2019-11-02 MED ORDER — MIDAZOLAM HCL 2 MG/2ML IJ SOLN
INTRAMUSCULAR | Status: DC | PRN
  Administered 2019-11-02 (×2): 1 mg via INTRAVENOUS

## 2019-11-02 MED ORDER — ASPIRIN 81 MG PO CHEW: 81.0000 mg | CHEWABLE_TABLET | Freq: Every day | ORAL | Status: DC

## 2019-11-02 MED ORDER — HEPARIN SODIUM (PORCINE) 1000 UNIT/ML IJ SOLN
INTRAMUSCULAR | Status: AC
  Filled 2019-11-02: qty 1

## 2019-11-02 MED ORDER — ONDANSETRON HCL 4 MG/2ML IJ SOLN: 4.0000 mg | Freq: Four times a day (QID) | INTRAMUSCULAR | Status: DC | PRN

## 2019-11-02 MED ORDER — NITROGLYCERIN 1 MG/10 ML FOR IR/CATH LAB
INTRA_ARTERIAL | Status: AC
  Filled 2019-11-02: qty 10

## 2019-11-02 MED FILL — METOPROLOL SUCCINATE ER 25: 25 | 60 days supply | Qty: 30 | Fill #0

## 2019-11-02 MED FILL — ELIQUIS 5 MG TABLET: 5 | 30 days supply | Qty: 60 | Fill #0

## 2019-11-02 SURGICAL SUPPLY — 17 items
CATH 5FR JL3.5 JR4 ANG PIG MP (CATHETERS) ×1 IMPLANT
CATH BALLN WEDGE 5F 110CM (CATHETERS) ×1 IMPLANT
CATH LAUNCHER 6FR EBU3.5 (CATHETERS) ×1 IMPLANT
CATH LAUNCHER 6FR JR4 (CATHETERS) ×1 IMPLANT
DEVICE RAD COMP TR BAND LRG (VASCULAR PRODUCTS) ×1 IMPLANT
GLIDESHEATH SLEND A-KIT 6F 22G (SHEATH) ×1 IMPLANT
GUIDEWIRE .025 260CM (WIRE) ×1 IMPLANT
GUIDEWIRE INQWIRE 1.5J.035X260 (WIRE) IMPLANT
GUIDEWIRE PRESSURE COMET II (WIRE) ×1 IMPLANT
INQWIRE 1.5J .035X260CM (WIRE) ×2
KIT ENCORE 26 ADVANTAGE (KITS) ×1 IMPLANT
KIT HEART LEFT (KITS) ×2 IMPLANT
PACK CARDIAC CATHETERIZATION (CUSTOM PROCEDURE TRAY) ×2 IMPLANT
SHEATH GLIDE SLENDER 4/5FR (SHEATH) ×1 IMPLANT
SYR MEDRAD MARK 7 150ML (SYRINGE) ×2 IMPLANT
TRANSDUCER W/STOPCOCK (MISCELLANEOUS) ×2 IMPLANT
TUBING CIL FLEX 10 FLL-RA (TUBING) ×2 IMPLANT

## 2019-11-02 NOTE — Discharge Instructions (Signed)
Radial Site Care  This sheet gives you information about how to care for yourself after your procedure. Your health care provider may also give you more specific instructions. If you have problems or questions, contact your health care provider. What can I expect after the procedure? After the procedure, it is common to have:  Bruising and tenderness at the catheter insertion area. Follow these instructions at home: Medicines  Take over-the-counter and prescription medicines only as told by your health care provider. Insertion site care  Follow instructions from your health care provider about how to take care of your insertion site. Make sure you: ? Wash your hands with soap and water before you change your bandage (dressing). If soap and water are not available, use hand sanitizer. ? Change your dressing as told by your health care provider. ? Leave stitches (sutures), skin glue, or adhesive strips in place. These skin closures may need to stay in place for 2 weeks or longer. If adhesive strip edges start to loosen and curl up, you may trim the loose edges. Do not remove adhesive strips completely unless your health care provider tells you to do that.  Check your insertion site every day for signs of infection. Check for: ? Redness, swelling, or pain. ? Fluid or blood. ? Pus or a bad smell. ? Warmth.  Do not take baths, swim, or use a hot tub until your health care provider approves.  You may shower 24-48 hours after the procedure, or as directed by your health care provider. ? Remove the dressing and gently wash the site with plain soap and water. ? Pat the area dry with a clean towel. ? Do not rub the site. That could cause bleeding.  Do not apply powder or lotion to the site. Activity   For 24 hours after the procedure, or as directed by your health care provider: ? Do not flex or bend the affected arm. ? Do not push or pull heavy objects with the affected arm. ? Do not  drive yourself home from the hospital or clinic. You may drive 24 hours after the procedure unless your health care provider tells you not to. ? Do not operate machinery or power tools.  Do not lift anything that is heavier than 10 lb (4.5 kg), or the limit that you are told, until your health care provider says that it is safe.  Ask your health care provider when it is okay to: ? Return to work or school. ? Resume usual physical activities or sports. ? Resume sexual activity. General instructions  If the catheter site starts to bleed, raise your arm and put firm pressure on the site. If the bleeding does not stop, get help right away. This is a medical emergency.  If you went home on the same day as your procedure, a responsible adult should be with you for the first 24 hours after you arrive home.  Keep all follow-up visits as told by your health care provider. This is important. Contact a health care provider if:  You have a fever.  You have redness, swelling, or yellow drainage around your insertion site. Get help right away if:  You have unusual pain at the radial site.  The catheter insertion area swells very fast.  The insertion area is bleeding, and the bleeding does not stop when you hold steady pressure on the area.  Your arm or hand becomes pale, cool, tingly, or numb. These symptoms may represent a serious problem   that is an emergency. Do not wait to see if the symptoms will go away. Get medical help right away. Call your local emergency services (911 in the [place] Do not drive yourself to the hospital. Summary  After the procedure, it is common to have bruising and tenderness at the site.  Follow instructions from your health care provider about how to take care of your radial site wound. Check the wound every day for signs of infection.  Do not lift anything that is heavier than 10 lb (4.5 kg), or the limit that you are told, until your health care provider says  that it is safe. This information is not intended to replace advice given to you by your health care provider. Make sure you discuss any questions you have with your health care provider. Document Revised: 10/13/2017 Document Reviewed: 10/13/2017 Elsevier Patient Education  2020 [employer] Inc[employer]  

## 2019-11-02 NOTE — Progress Notes (Signed)
Discharge instructions reviewed with patient and family. Pharmacy delivered new medications and reviewed with patient and wife. Verbalized understanding.

## 2019-11-02 NOTE — Telephone Encounter (Signed)
Clarified with patient's spouse patient should have a prescription for Eliqius, Metoprolol and Nitroglycerin.   Spouse verbalized understanding.

## 2019-11-02 NOTE — Interval H&P Note (Signed)
History and Physical Interval Note:  11/02/2019 8:29 AM  Daniel Ball  has presented today for surgery, with the diagnosis of accelerating angina and dyspnea on exertion.  The various methods of treatment have been discussed with the patient and family. After consideration of risks, benefits and other options for treatment, the patient has consented to  Procedure(s): RIGHT/LEFT HEART CATH AND CORONARY ANGIOGRAPHY (N/A) as a surgical intervention.  The patient's history has been reviewed, patient examined, no change in status, stable for surgery.  I have reviewed the patient's chart and labs.  Questions were answered to the patient's satisfaction.  Cath Lab Visit (complete for each Cath Lab visit)  Clinical Evaluation Leading to the Procedure:   ACS: No.  Non-ACS:    Anginal Classification: CCS III  Anti-ischemic medical therapy: No Therapy  Non-Invasive Test Results: No non-invasive testing performed  Prior CABG: No previous CABG  Brandilynn Taormina

## 2019-11-02 NOTE — Telephone Encounter (Signed)
Patient's wife called about medication they prescribed to him at discharge today after his procedure.  She has some questions about it because [facility] called her saying that they have two medication ready for pick up, and one of them is not listed on the sheet they gave him.

## 2019-11-02 NOTE — Progress Notes (Signed)
    Called to short stay by RN with concern of Afib on telemetry. EKG done and confirmed new onset afib rate controlled. He was placed on Toprol XL 12.5mg  post cath. Discussed with attending and will plan to start on Eliquis 5mg  BID as his CHADsVASc is at least 3 (HTN, CAD, CHF). Discussed plan with the patient while in short stay. Instructed to start Eliquis tomorrow morning given cath today. Follow up arranged in the [place] office.   SignedReino Bellis, NP-C 11/02/2019, 12:38 PM Pager: [phone]   Conclusions: 1. Two-vessel coronary artery disease with 60% proximal LAD stenosis that is heavily calcified and hemodynamically significant (DFR = 0.86), as well as multifocal RCA disease with a long segment of 40% proximal stenosis and irregular distal lesion of up to 70-80% (not hemodynamically significant; DFR = 0.94).  There is also focal ostial stenosis involving the rPDA of approximately 70%. 2. Mildly reduced left ventricular systolic function with global hypokinesis (LVEF 45-50%). 3. Mildly elevated left and right heart filling pressures. 4. Mildly to moderately reduced Fick cardiac output/index.  Recommendations: 1. Optimize medical therapy for coronary artery disease and cardiomyopathy.  If Daniel Ball has refractory angina despite maximal tolerated doses of at least 2 antianginal agents, PCI to the LAD could be considered.  Due to the heavy calcification, atherectomy would need to be considered. 2. I will add metoprolol succinate 12.5 mg daily, to be escalated as tolerated. 3. Aggressive medical therapy and risk factor modification to prevent progression of CAD. 4. Obtain echocardiogram for further assessment of cardiomyopathy and other structural heart disease.  Daniel Bush, MD [facility]  Diagnostic Dominance: Right

## 2019-11-06 ENCOUNTER — Other Ambulatory Visit: Payer: Self-pay

## 2019-11-06 ENCOUNTER — Telehealth: Payer: Self-pay

## 2019-11-06 DIAGNOSIS — I10 Essential (primary) hypertension: Secondary | ICD-10-CM

## 2019-11-06 DIAGNOSIS — Z79899 Other long term (current) drug therapy: Secondary | ICD-10-CM

## 2019-11-06 NOTE — Telephone Encounter (Signed)
-----   Message from Fay Records, MD sent at 11/02/2019  3:23 PM EST ----- REviewed catheterization results. Pt will need continued follow up as outpt F/U 1-2 wks to assess BP LPids will need to be followed closely with goal for LDL 70 or less Would recomm Echo to evaluate LV function.

## 2019-11-06 NOTE — Telephone Encounter (Signed)
Echo scheduled for 11/17/19 at 1130 am, has fu 11/20/19

## 2019-11-08 NOTE — Progress Notes (Signed)
Cardiology Office Note    Date:  11/20/2019   ID:  Daniel Ball, Daniel Ball 02-04-1956, MRN [id number]  PCP:  [name], MD  Cardiologist: [name], MD EPS: None  Chief Complaint  Patient presents with  . Hospitalization Follow-up    History of Present Illness:  Daniel Ball is a 64 y.o. male with history of mild to moderate disease on cath 2005, no ischemia on stress cardiolite 2009 but didn't reach target HR. Also has HTN, COPD, tobacco abuse, OSA.   Patient saw Dr. Harrington Challenger with progressive DOE and chest tightness.  Cardiac cath performed 10/30/2019 revealed 60% proximal LAD that is heavily calcified and hemodynamically significant DFR 0.86 as well as a multifocal RCA disease with long segment of 40% proximal stenosis and irregular distal disease 70 to 80% not hemodynamically significant DFR 0.94.  Also focal ostial stenosis in the right PDA 70%, mild reduced LV function EF 45 to 50% mildly elevated right heart filling pressures.  Recommended optimizing medical therapy for CAD and cardiomyopathy.  If refractory angina despite maximum tolerated doses of at least 2 antianginals PCI of the LAD could be considered.  Due to heavy calcification atherectomy would need to be considered.  Metoprolol 12.5 mg daily was added and 2D echo recommended.  Prior to discharge the patient went into A. fib with rate controlled.  Eliquis 5 mg twice daily was added for CHA2DS2-VASc of 3 hypertension CAD and CHF.  Patient comes in for f/u. Doesn't feel any different. Still having DOE and chest tightness with activity. Can hardly do anything. Having a pin point pain here in the office not as bad as when he's doing something. Denies palpitations but didn't feel afib in the hospital.  Still smoking a half a pack per day.  A lot of fatigue.  Past Medical History:  Diagnosis Date  . Arthritis   . COPD (chronic obstructive pulmonary disease) ([place])   . Coronary artery disease   . GERD (gastroesophageal reflux  disease)   . Hyperlipidemia   . Hypertension    hx HBP - MEDS DC'D 10 YRS since BP has been in normal range  . Impingement syndrome of shoulder    left  . Shortness of breath    with exertion  . Sleep apnea    couldnt tolerate CPAP  . Spinal stenosis     Past Surgical History:  Procedure Laterality Date  . EYE SURGERY Left   . HAND SURGERY Right    thumb  . INTRAVASCULAR PRESSURE WIRE/FFR STUDY N/A 11/02/2019   Procedure: INTRAVASCULAR PRESSURE WIRE/FFR STUDY;  Surgeon: Nelva Bush, MD;  Location: [facility];  Service: Cardiovascular;  Laterality: N/A;  . LUMBAR LAMINECTOMY/DECOMPRESSION MICRODISCECTOMY N/A 02/07/2014   Procedure: LUMBAR DECOMPRESSION Lumbar one-Lumbar five;  Surgeon: [name], MD;  Location: [facility];  Service: Orthopedics;  Laterality: N/A;  . RIGHT/LEFT HEART CATH AND CORONARY ANGIOGRAPHY N/A 11/02/2019   Procedure: RIGHT/LEFT HEART CATH AND CORONARY ANGIOGRAPHY;  Surgeon: Nelva Bush, MD;  Location: [facility];  Service: Cardiovascular;  Laterality: N/A;  . SHOULDER ARTHROSCOPY WITH SUBACROMIAL DECOMPRESSION Left 04/12/2014   Procedure: LEFT SHOULDER ARTHROSCOPY WITH SUBACROMIAL DECOMPRESSION AND LABRAL DEBRIDEMENT, ROTATOR CUFF DEBRIDEMENT, BICEPS DEBRIDEMENT;  Surgeon: [name], MD;  Location: [facility];  Service: Orthopedics;  Laterality: Left;    Current Medications: Current Meds  Medication Sig  . apixaban (ELIQUIS) 5 MG TABS tablet Take 1 tablet (5 mg total) by mouth 2 (two)  times daily.  [name] aspirin EC 81 MG tablet Take 81 mg by mouth daily.  [name] buPROPion (WELLBUTRIN XL) 300 MG 24 hr tablet Take 300 mg by mouth daily.  . cyclobenzaprine (FLEXERIL) 10 MG tablet Take 10 mg by mouth 3 (three) times daily as needed for muscle spasms.  . metoprolol succinate (TOPROL XL) 25 MG 24 hr tablet Take 1 tablet (25 mg total) by mouth daily.  . nitroGLYCERIN (NITROSTAT) 0.4 MG SL tablet Place 1 tablet (0.4 mg total) under the tongue every 5  (five) minutes as needed for chest pain.  [name] omeprazole (PRILOSEC) 20 MG capsule Take 20 mg by mouth daily.  . rosuvastatin (CRESTOR) 10 MG tablet Take 10 mg by mouth daily.  . valsartan (DIOVAN) 320 MG tablet Take 320 mg by mouth daily.  . [DISCONTINUED] metoprolol succinate (TOPROL XL) 25 MG 24 hr tablet Take 0.5 tablets (12.5 mg total) by mouth daily.     Allergies:   Patient has no known allergies.   Social History   Socioeconomic History  . Marital status: Married    Spouse name: Not on file  . Number of children: Not on file  . Years of education: Not on file  . Highest education level: Not on file  Occupational History  . Occupation: Pipefitter  Tobacco Use  . Smoking status: Current Every Day Smoker    Packs/day: 2.50    Years: 54.00    Pack years: 135.00    Types: Cigarettes  . Smokeless tobacco: Never Used  . Tobacco comment: 10 cigarettes a day  Substance and Sexual Activity  . Alcohol use: No  . Drug use: No  . Sexual activity: Not on file  Other Topics Concern  . Not on file  Social History Narrative  . Not on file   Social Determinants of Health   Financial Resource Strain:   . Difficulty of Paying Living Expenses: Not on file  Food Insecurity:   . Worried About Charity fundraiser in the Last Year: Not on file  . Ran Out of Food in the Last Year: Not on file  Transportation Needs:   . Lack of Transportation (Medical): Not on file  . Lack of Transportation (Non-Medical): Not on file  Physical Activity:   . Days of Exercise per Week: Not on file  . Minutes of Exercise per Session: Not on file  Stress:   . Feeling of Stress : Not on file  Social Connections:   . Frequency of Communication with Friends and Family: Not on file  . Frequency of Social Gatherings with Friends and Family: Not on file  . Attends Religious Services: Not on file  . Active Member of Clubs or Organizations: Not on file  . Attends Archivist Meetings: Not on file  .  Marital Status: Not on file     Family History:  The patient's   family history is not on file.   ROS:   Please see the history of present illness.    ROS All other systems reviewed and are negative.   PHYSICAL EXAM:   VS:  BP 132/74   Pulse 74   Temp 98.5 F (36.9 C)   Ht 5\' 5"  (1.651 m)   Wt 249 lb (112.9 kg)   SpO2 97%   BMI 41.44 kg/m   Physical Exam  GEN: Obese, in no acute distress  Neck: no JVD, carotid bruits, or masses Cardiac:RRR; some skipping, distant heart sounds no murmurs, rubs, or  gallops  Respiratory: Decreased breath sounds with inspiratory wheezing at the right lung base GI: soft, nontender, nondistended, + BS Ext: without cyanosis, clubbing, or edema, Good distal pulses bilaterally Neuro:  Alert and Oriented x 3 Psych: euthymic mood, full affect  Wt Readings from Last 3 Encounters:  11/20/19 249 lb (112.9 kg)  11/02/19 245 lb (111.1 kg)  10/30/19 252 lb (114.3 kg)      Studies/Labs Reviewed:   EKG:  EKG is  ordered today.  The ekg ordered today demonstrates atypical atrial fibrillation/flutter at 90 bpm reviewed with Dr. Domenic Polite  Recent Labs: 10/30/2019: BUN 14; Creatinine, Ser 1.13; Platelets 152 11/02/2019: Hemoglobin 14.6; Potassium 4.2; Sodium 141   Lipid Panel No results found for: CHOL, TRIG, HDL, CHOLHDL, VLDL, LDLCALC, LDLDIRECT  Additional studies/ records that were reviewed today include:  2D echo 11/17/2019 IMPRESSIONS     1. Left ventricular ejection fraction, by estimation, is 60 to 65%. The  left ventricle has normal function. The left ventricle has no regional  wall motion abnormalities. There is mild concentric left ventricular  hypertrophy. Left ventricular diastolic  parameters are indeterminate.   2. Right ventricular systolic function is normal. The right ventricular  size is normal.   3. The mitral valve is grossly normal. Trivial mitral valve  regurgitation.   4. The aortic valve is tricuspid. Aortic valve  regurgitation is not  visualized. Mild aortic valve sclerosis is present, with no evidence of  aortic valve stenosis.   5. The inferior vena cava is normal in size with greater than 50%  respiratory variability, suggesting right atrial pressure of 3 mmHg.   FINDINGS   Left Ventricle: Left ventricular ejection fraction, by estimation, is 60  to 65%. The left ventricle has normal function. The left ventricle has no  regional wall motion abnormalities. The left ventricular internal cavity  size was normal in size. There is   mild concentric left ventricular hypertrophy. Left ventricular diastolic  parameters are indeterminate.   Right Ventricle: The right ventricular size is normal. No increase in  right ventricular wall thickness. Right ventricular systolic function is  normal.   Left Atrium: Left atrial size was normal in size.   Right Atrium: Right atrial size was normal in size.   Pericardium: There is no evidence of pericardial effusion.   Mitral Valve: The mitral valve is grossly normal. Mild mitral annular  calcification. Trivial mitral valve regurgitation.   Tricuspid Valve: The tricuspid valve is grossly normal. Tricuspid valve  regurgitation is trivial.   Aortic Valve: The aortic valve is tricuspid. . There is mild thickening  and mild calcification of the aortic valve. Aortic valve regurgitation is  not visualized. Mild aortic valve sclerosis is present, with no evidence  of aortic valve stenosis. Moderate  aortic valve annular calcification. There is mild thickening of the aortic  valve. There is mild calcification of the aortic valve.   Pulmonic Valve: The pulmonic valve was grossly normal. Pulmonic valve  regurgitation is not visualized.   Aorta: The aortic root is normal in size and structure.   Venous: The inferior vena cava is normal in size with greater than 50%  respiratory variability, suggesting right atrial pressure of 3 mmHg.   IAS/Shunts: No atrial  level shunt detected by color flow Doppler.    Cardiac cath 11/02/19 Conclusions: 1. Two-vessel coronary artery disease with 60% proximal LAD stenosis that is heavily calcified and hemodynamically significant (DFR = 0.86), as well as multifocal RCA disease with a long  segment of 40% proximal stenosis and irregular distal lesion of up to 70-80% (not hemodynamically significant; DFR = 0.94).  There is also focal ostial stenosis involving the rPDA of approximately 70%. 2. Mildly reduced left ventricular systolic function with global hypokinesis (LVEF 45-50%). 3. Mildly elevated left and right heart filling pressures. 4. Mildly to moderately reduced Fick cardiac output/index.   Recommendations: 1. Optimize medical therapy for coronary artery disease and cardiomyopathy.  If Daniel Ball has refractory angina despite maximal tolerated doses of at least 2 antianginal agents, PCI to the LAD could be considered.  Due to the heavy calcification, atherectomy would need to be considered. 2. I will add metoprolol succinate 12.5 mg daily, to be escalated as tolerated. 3. Aggressive medical therapy and risk factor modification to prevent progression of CAD. 4. Obtain echocardiogram for further assessment of cardiomyopathy and other structural heart disease.   Nelva Bush, MD [facility]       ASSESSMENT:    1. Coronary artery disease involving native coronary artery of native heart without angina pectoris   2. Ischemic cardiomyopathy   3. Paroxysmal atrial fibrillation (HCC)   4. Essential hypertension   5. Hyperlipidemia, unspecified hyperlipidemia type   6. Tobacco abuse   7. OSA (obstructive sleep apnea)      PLAN:  In order of problems listed above:  CAD with significant two-vessel disease on cath 10/30/2019.  Plan was to optimize medical therapy for CAD and cardiomyopathy.  If refractory angina despite maximal tolerated doses of at least 2 antianginals, PCI the LAD could be considered.   Due to heavy calcification atherectomy would need to be considered.  Metoprolol added.  Patient still having significant dyspnea on exertion and chest discomfort with little activity.  We will add amlodipine 5 mg once daily.  Have early follow-up with Dr. Harrington Challenger.  EKG today shows patient to be in A. fib/flutter which could be contributing to his symptoms.  Ischemic cardiomyopathy ejection fraction 45 to 50% on cath 10/30/2019.  2D echo 11/17/19: Normal LVEF and normal right heart  New onset atrial fibrillation with controlled rate post cath started on Eliquis for chads vas score of 3.  Heart rate 90 at rest.  Suspect heart rate increases with little activity.  Will increase metoprolol to 25 mg once daily.  Place Zio monitor.  Essential hypertension blood pressure has room to add amlodipine  Hyperlipidemia on Crestor 10 mg once daily  COPD with ongoing tobacco abuse has cut back to half pack daily but unable to cut back any further.  Has not had any success with nicotine patches in the past  OSA not on CPAP    Medication Adjustments/Labs and Tests Ordered: Current medicines are reviewed at length with the patient today.  Concerns regarding medicines are outlined above.  Medication changes, Labs and Tests ordered today are listed in the Patient Instructions below. Patient Instructions  Medication Instructions:  START Amlodipine 5 mg daily   INCREASE Toprol XL to 25 mg daily  *If you need a refill on your cardiac medications before your next appointment, please call your pharmacy*   Lab Work: none If you have labs (blood work) drawn today and your tests are completely normal, you will receive your results only by: [name] MyChart Message (if you have MyChart) OR . A paper copy in the mail If you have any lab test that is abnormal or we need to change your treatment, we will call you to review the results.   Testing/Procedures: Leisure centre manager  Zio monitor for 14 days    Follow-Up: At [facility],  you and your health needs are our priority.  As part of our continuing mission to provide you with exceptional heart care, we have created designated Provider Care Teams.  These Care Teams include your primary Cardiologist (physician) and Advanced Practice Providers (APPs -  Physician Assistants and Nurse Practitioners) who all work together to provide you with the care you need, when you need it.  We recommend signing up for the patient portal called "MyChart".  Sign up information is provided on this After Visit Summary.  MyChart is used to connect with patients for Virtual Visits (Telemedicine).  Patients are able to view lab/test results, encounter notes, upcoming appointments, etc.  Non-urgent messages can be sent to your provider as well.   To learn more about what you can do with MyChart, go to [email or web address].    Your next appointment:  Next available with Dr.ROSS   The format for your next appointment:   In Person  Provider:   [name], MD   Other Instructions None      Thank you for choosing [facility] !            Sumner Boast, PA-C  11/20/2019 11:58 AM    [facility] [address], [place], [place]  [address] Phone: [phone]; Fax: [phone]

## 2019-11-17 ENCOUNTER — Ambulatory Visit (HOSPITAL_COMMUNITY)
Admission: RE | Admit: 2019-11-17 | Discharge: 2019-11-17 | Disposition: A | Discharge status: Home / Self Care | Payer: Medicare Other | Source: Ambulatory Visit | Attending: Internal Medicine | Admitting: Internal Medicine

## 2019-11-17 ENCOUNTER — Other Ambulatory Visit: Payer: Self-pay

## 2019-11-17 DIAGNOSIS — I10 Essential (primary) hypertension: Secondary | ICD-10-CM | POA: Insufficient documentation

## 2019-11-17 DIAGNOSIS — F17210 Nicotine dependence, cigarettes, uncomplicated: Secondary | ICD-10-CM | POA: Diagnosis not present

## 2019-11-17 DIAGNOSIS — E785 Hyperlipidemia, unspecified: Secondary | ICD-10-CM | POA: Diagnosis not present

## 2019-11-17 DIAGNOSIS — Z77090 Contact with and (suspected) exposure to asbestos: Secondary | ICD-10-CM | POA: Diagnosis not present

## 2019-11-17 DIAGNOSIS — K219 Gastro-esophageal reflux disease without esophagitis: Secondary | ICD-10-CM | POA: Diagnosis not present

## 2019-11-17 DIAGNOSIS — J449 Chronic obstructive pulmonary disease, unspecified: Secondary | ICD-10-CM | POA: Insufficient documentation

## 2019-11-17 DIAGNOSIS — I2510 Atherosclerotic heart disease of native coronary artery without angina pectoris: Secondary | ICD-10-CM | POA: Diagnosis not present

## 2019-11-17 NOTE — Progress Notes (Signed)
*  PRELIMINARY RESULTS* Echocardiogram 2D Echocardiogram has been performed.  Daniel Ball 11/17/2019, 12:39 PM

## 2019-11-20 ENCOUNTER — Other Ambulatory Visit: Payer: Self-pay

## 2019-11-20 ENCOUNTER — Encounter: Payer: Self-pay | Admitting: Physician Assistant

## 2019-11-20 ENCOUNTER — Ambulatory Visit (INDEPENDENT_AMBULATORY_CARE_PROVIDER_SITE_OTHER): Payer: Medicare Other

## 2019-11-20 ENCOUNTER — Ambulatory Visit (INDEPENDENT_AMBULATORY_CARE_PROVIDER_SITE_OTHER): Payer: Medicare Other | Admitting: Physician Assistant

## 2019-11-20 VITALS — BP 132/74 | HR 74 | Temp 98.5°F | Ht 65.0 in | Wt 249.0 lb

## 2019-11-20 DIAGNOSIS — I2510 Atherosclerotic heart disease of native coronary artery without angina pectoris: Secondary | ICD-10-CM

## 2019-11-20 DIAGNOSIS — I480 Paroxysmal atrial fibrillation: Secondary | ICD-10-CM

## 2019-11-20 DIAGNOSIS — I10 Essential (primary) hypertension: Secondary | ICD-10-CM

## 2019-11-20 DIAGNOSIS — E785 Hyperlipidemia, unspecified: Secondary | ICD-10-CM | POA: Diagnosis not present

## 2019-11-20 DIAGNOSIS — I255 Ischemic cardiomyopathy: Secondary | ICD-10-CM | POA: Diagnosis not present

## 2019-11-20 DIAGNOSIS — G4733 Obstructive sleep apnea (adult) (pediatric): Secondary | ICD-10-CM

## 2019-11-20 DIAGNOSIS — Z720 Tobacco use: Secondary | ICD-10-CM

## 2019-11-20 MED ORDER — METOPROLOL SUCCINATE ER 25 MG PO TB24: 25.0000 mg | ORAL_TABLET | Freq: Every day | ORAL | 9 refills | Status: DC

## 2019-11-20 MED ORDER — AMLODIPINE BESYLATE 5 MG PO TABS: 5.0000 mg | ORAL_TABLET | Freq: Every day | ORAL | 3 refills | Status: DC

## 2019-11-20 NOTE — Patient Instructions (Addendum)
Medication Instructions:  START Amlodipine 5 mg daily   INCREASE Toprol XL to 25 mg daily  *If you need a refill on your cardiac medications before your next appointment, please call your pharmacy*   Lab Work: none If you have labs (blood work) drawn today and your tests are completely normal, you will receive your results only by: [name] MyChart Message (if you have MyChart) OR . A paper copy in the mail If you have any lab test that is abnormal or we need to change your treatment, we will call you to review the results.   Testing/Procedures: Wear Zio monitor for 14 days    Follow-Up: At [facility], you and your health needs are our priority.  As part of our continuing mission to provide you with exceptional heart care, we have created designated Provider Care Teams.  These Care Teams include your primary Cardiologist (physician) and Advanced Practice Providers (APPs -  Physician Assistants and Nurse Practitioners) who all work together to provide you with the care you need, when you need it.  We recommend signing up for the patient portal called "MyChart".  Sign up information is provided on this After Visit Summary.  MyChart is used to connect with patients for Virtual Visits (Telemedicine).  Patients are able to view lab/test results, encounter notes, upcoming appointments, etc.  Non-urgent messages can be sent to your provider as well.   To learn more about what you can do with MyChart, go to [email or web address].    Your next appointment:  Next available with Dr.ROSS   The format for your next appointment:   In Person  Provider:   [name], MD   Other Instructions None      Thank you for choosing [facility] !

## 2019-11-22 ENCOUNTER — Telehealth: Payer: Self-pay

## 2019-11-22 NOTE — Telephone Encounter (Signed)
The patient has been notified of the result and verbalized understanding.  All questions (if any) were answered.  Pt aware of his 3/11 f/u appt at [facility], [place] 11/22/2019 8:43 AM

## 2019-11-22 NOTE — Telephone Encounter (Signed)
-----   Message from [name] V, MD sent at 11/17/2019  1:20 PM EST ----- Pumping function of the RV and LV are normal   The valves are functioning well Continue medical therapy   Make sure he has f/u appt with me in a few wks

## 2019-11-27 ENCOUNTER — Ambulatory Visit: Payer: Medicare Other | Admitting: Physician Assistant

## 2019-11-30 ENCOUNTER — Other Ambulatory Visit: Payer: Self-pay

## 2019-11-30 ENCOUNTER — Encounter: Payer: Self-pay | Admitting: Internal Medicine

## 2019-11-30 ENCOUNTER — Ambulatory Visit (INDEPENDENT_AMBULATORY_CARE_PROVIDER_SITE_OTHER): Payer: Medicare Other | Admitting: Internal Medicine

## 2019-11-30 VITALS — BP 124/74 | HR 73 | Ht 65.0 in | Wt 249.0 lb

## 2019-11-30 DIAGNOSIS — I2510 Atherosclerotic heart disease of native coronary artery without angina pectoris: Secondary | ICD-10-CM

## 2019-11-30 DIAGNOSIS — R0600 Dyspnea, unspecified: Secondary | ICD-10-CM

## 2019-11-30 DIAGNOSIS — I255 Ischemic cardiomyopathy: Secondary | ICD-10-CM | POA: Diagnosis not present

## 2019-11-30 DIAGNOSIS — I10 Essential (primary) hypertension: Secondary | ICD-10-CM

## 2019-11-30 MED ORDER — AMLODIPINE BESYLATE 5 MG PO TABS: 7.5000 mg | ORAL_TABLET | Freq: Every day | ORAL | 3 refills | Status: DC

## 2019-11-30 NOTE — Patient Instructions (Signed)
Medication Instructions:   1.) increase amlodipine to 7.5 mg daily (take one and half tablets of your 5 mg tablets)  *If you need a refill on your cardiac medications before your next appointment, please call your pharmacy*   Lab Work: none If you have labs (blood work) drawn today and your tests are completely normal, you will receive your results only by: [name] MyChart Message (if you have MyChart) OR . A paper copy in the mail If you have any lab test that is abnormal or we need to change your treatment, we will call you to review the results.   Testing/Procedures: none   Follow-Up: We will contact you to arrange your next appointment with Dr. Harrington Challenger  Other Instructions

## 2019-11-30 NOTE — Progress Notes (Signed)
Cardiology Office Note   Date:  11/30/2019   ID:  [name], [name] Jun 18, 1956, MRN [id number]  PCP:  [name], MD  Cardiologist:   [name], MD   Pt presents for f/u of CAD    History of Present Illness: Daniel Ball is a 64 y.o. male with a history of HTN, COPD, tobacco use, reported OSA  and CAD    He is followed by Dr Dema Severin at [place].  He had previously seen Thurman Coyer  The pt had cardiac cath in 2005 that showed mild to moderate dz.  Stress cardiolite done in 2009   No ischemia detected but pt did not reach targe HR.   The pt says he has noticed a progressive SOB / DOE   Giving out much easier than he did in the past. Some chest pressure   He cut back on smoking  Down to 1/2 pack  Thought symptoms would get better  Instead, they have gotten worse  I saw the pt in Feb 2021   Concerned about symptoms of chest tightness and SOB    Recomm L heart cath.  Cardiac cath performed 10/30/2019 revealed 60% proximal LAD that is heavily calcified and hemodynamically significant DFR 0.86 as well as a multifocal RCA disease with long segment of 40% proximal stenosis and irregular distal disease 70 to 80% not hemodynamically significant DFR 0.94.  Also focal ostial stenosis in the right PDA 70%, mild reduced LV function EF 45 to 50% mildly elevated right heart filling pressures.  Rec were for  optimizing medical therapy for CAD and cardiomyopathy.  If re angina persisted despite maximum tolerated doses of at least 2 antianginals PCI of the LAD could be considered.  Due to heavy calcification atherectomy would need to be considered.  Prior to discharge the patient went into A. fib with rate controlled.  Eliquis 5 mg twice daily was added for CHA2DS2-VASc of 3 hypertension CAD and CHF.  The pt was seen by Gerrianne Scale on 11/20/19 Amlodipine added to regimen, metorprolol was increased to 25 mg   ZIo monitor placed  Since he was seen he says when he is working outside he still gives out quickly with SOB  and chest tightness   He does note some wheezing  With walking he does not have symptoms     Says he has not noted much difference with addition of meds  Still smoking but down to 10 cigs per day    The patient has a f/u appt with Selinda Orion on 3/18 He says he was told he did not have COPD   PFTs in 2019 showed minimial obstruction and moderate diffusion abnorm.      Current Meds  Medication Sig  . amLODipine (NORVASC) 5 MG tablet Take 1 tablet (5 mg total) by mouth daily.  [name] apixaban (ELIQUIS) 5 MG TABS tablet Take 1 tablet (5 mg total) by mouth 2 (two) times daily.  [name] aspirin EC 81 MG tablet Take 81 mg by mouth daily.  [name] buPROPion (WELLBUTRIN XL) 300 MG 24 hr tablet Take 300 mg by mouth daily.  . cyclobenzaprine (FLEXERIL) 10 MG tablet Take 10 mg by mouth 3 (three) times daily as needed for muscle spasms.  . metoprolol succinate (TOPROL XL) 25 MG 24 hr tablet Take 1 tablet (25 mg total) by mouth daily.  . nitroGLYCERIN (NITROSTAT) 0.4 MG SL tablet Place 1 tablet (0.4 mg total) under the tongue every 5 (five) minutes as needed  for chest pain.  [name] omeprazole (PRILOSEC) 20 MG capsule Take 20 mg by mouth daily.  . rosuvastatin (CRESTOR) 10 MG tablet Take 10 mg by mouth daily.  . valsartan (DIOVAN) 320 MG tablet Take 320 mg by mouth daily.     Allergies:   Patient has no known allergies.   Past Medical History:  Diagnosis Date  . Arthritis   . COPD (chronic obstructive pulmonary disease) ([place])   . Coronary artery disease   . GERD (gastroesophageal reflux disease)   . Hyperlipidemia   . Hypertension    hx HBP - MEDS DC'D 10 YRS since BP has been in normal range  . Impingement syndrome of shoulder    left  . Shortness of breath    with exertion  . Sleep apnea    couldnt tolerate CPAP  . Spinal stenosis     Past Surgical History:  Procedure Laterality Date  . EYE SURGERY Left   . HAND SURGERY Right    thumb  . INTRAVASCULAR PRESSURE WIRE/FFR STUDY N/A 11/02/2019   Procedure:  INTRAVASCULAR PRESSURE WIRE/FFR STUDY;  Surgeon: Nelva Bush, MD;  Location: [facility];  Service: Cardiovascular;  Laterality: N/A;  . LUMBAR LAMINECTOMY/DECOMPRESSION MICRODISCECTOMY N/A 02/07/2014   Procedure: LUMBAR DECOMPRESSION Lumbar one-Lumbar five;  Surgeon: [name], MD;  Location: [facility];  Service: Orthopedics;  Laterality: N/A;  . RIGHT/LEFT HEART CATH AND CORONARY ANGIOGRAPHY N/A 11/02/2019   Procedure: RIGHT/LEFT HEART CATH AND CORONARY ANGIOGRAPHY;  Surgeon: Nelva Bush, MD;  Location: [facility];  Service: Cardiovascular;  Laterality: N/A;  . SHOULDER ARTHROSCOPY WITH SUBACROMIAL DECOMPRESSION Left 04/12/2014   Procedure: LEFT SHOULDER ARTHROSCOPY WITH SUBACROMIAL DECOMPRESSION AND LABRAL DEBRIDEMENT, ROTATOR CUFF DEBRIDEMENT, BICEPS DEBRIDEMENT;  Surgeon: [name], MD;  Location: [facility];  Service: Orthopedics;  Laterality: Left;     Social History:  The patient  reports that he has been smoking cigarettes. He has a 135.00 pack-year smoking history. He has never used smokeless tobacco. He reports that he does not drink alcohol or use drugs.   Family History:  The patient's family history is not on file.    ROS:  Please see the history of present illness. All other systems are reviewed and  Negative to the above problem except as noted.    PHYSICAL EXAM: VS:  BP 124/74   Pulse 73   Ht 5\' 5"  (1.651 m)   Wt 249 lb (112.9 kg)   SpO2 92%   BMI 41.44 kg/m   Sats:  93% with walkning  GEN: Morbidly obese in no acute distress  HEENT: normal  Neck: no JVD, carotid bruits Cardiac: RRR; no murmurs, rubs, or gallops,no edema 1+ posterior tibial pulses Respiratory: Moving air but mild wheezes upper airways   GI: soft, nontender, distended because of obesity MS: no deformity Moving all extremities   Skin: warm and dry, no rash   EKG:  EKG is not ordered today.   Lipid Panel No results found for: CHOL, TRIG, HDL, CHOLHDL, VLDL, LDLCALC,  LDLDIRECT    Wt Readings from Last 3 Encounters:  11/30/19 249 lb (112.9 kg)  11/20/19 249 lb (112.9 kg)  11/02/19 245 lb (111.1 kg)      ASSESSMENT AND PLAN:  1 CAD  PT with recent cath as noted above   He still gets SOB and has tightness   BUt he is wheezing some on exam I would pushe meds a bit more   7.5 mg amlodipine  Continue metoprolol    He has appt with Selinda Orion   Will review after this visit    Intervention to LAD would be more difficult given calcifications  2 Dyspnea  As above   Pt with some upper airway wheeze   Sats stayed at 93% with walking    Has appt in pulmonary next week  2 HTN   Continue meds   4   PAF   Now on ASA and Eliqus   Continue for right now   Follow CBC    5  HL   Continue Crestor  Will need to follow lipids    3 hypertension fair control.  Keep on same regimen for now  4 hyperlipidemia patient is on Crestor will need to get labs for review  5 history of tobacco use.  Will need counseling.  6.  Question sleep apnea.  Patient is a little unclear on this diagnosis.  Will need to review.  Follow-up will be based on test results.0 15   Current medicines are reviewed at length with the patient today.  The patient does not have concerns regarding medicines.  Signed, [name], MD  11/30/2019 9:23 AM    [address], [place], [place]  [address] Phone: [phone]; Fax: [phone]

## 2019-12-02 ENCOUNTER — Telehealth: Payer: Self-pay | Admitting: Internal Medicine

## 2019-12-02 DIAGNOSIS — R0600 Dyspnea, unspecified: Secondary | ICD-10-CM

## 2019-12-02 NOTE — Telephone Encounter (Signed)
Pt seen on 3/10   Sats 93%    Did not appear volume overloaded byt R heart pressures were mildly elevated  Add lasix 40 mg with 20 KCL every other day to regimen Get BMET and BNP in 10 days  He has appt with Selinda Orion on 3/18  ? If dyspnea volume related

## 2019-12-03 DIAGNOSIS — Z23 Encounter for immunization: Secondary | ICD-10-CM | POA: Diagnosis not present

## 2019-12-04 ENCOUNTER — Telehealth: Payer: Self-pay | Admitting: Internal Medicine

## 2019-12-04 MED ORDER — POTASSIUM CHLORIDE CRYS ER 20 MEQ PO TBCR: 20.0000 meq | EXTENDED_RELEASE_TABLET | ORAL | 1 refills | Status: DC

## 2019-12-04 MED ORDER — FUROSEMIDE 40 MG PO TABS: 40.0000 mg | ORAL_TABLET | ORAL | 1 refills | Status: DC

## 2019-12-04 MED ORDER — APIXABAN 5 MG PO TABS: 5.0000 mg | ORAL_TABLET | Freq: Two times a day (BID) | ORAL | 3 refills | Status: DC

## 2019-12-04 NOTE — Addendum Note (Signed)
Addended by: Rodman Key on: 12/04/2019 11:11 AM   Modules accepted: Orders

## 2019-12-04 NOTE — Telephone Encounter (Signed)
RX for Eliquis sent to the Pharmacy per pt request.

## 2019-12-04 NOTE — Telephone Encounter (Signed)
Informed pt of recommendations. He verbalizes understanding to take lasix and potassium every other day and return to lab for BMET, BNP on 12/13/19. Orders released.

## 2019-12-04 NOTE — Telephone Encounter (Signed)
New Message  Pt c/o medication issue:  1. Name of Medication: apixaban (ELIQUIS) 5 MG TABS tablet  2. How are you currently taking this medication (dosage and times per day)? As written  3. Are you having a reaction (difficulty breathing--STAT)? No  4. What is your medication issue? Patient is out of medication refills. Needs a new prescription

## 2019-12-05 ENCOUNTER — Telehealth: Payer: Self-pay | Admitting: Internal Medicine

## 2019-12-05 NOTE — Telephone Encounter (Signed)
I contacted out patient pharmacy to see if patient could have prescription refilled there and what cost would be.  When prescriptions were filled first month free card was used. I placed call to patient to update him. Left message to call office.

## 2019-12-05 NOTE — Telephone Encounter (Signed)
Pt was given 1 box of Eliquis and free 30 day coupon to pick up downstairs at the office.

## 2019-12-05 NOTE — Telephone Encounter (Signed)
I spoke with patient. He has 2 days of Eliquis left. It is going to cost $1500. He will not be able to afford this.  He states first prescription was filled by out patient pharmacy at [facility] and he paid $15.  He has not used a free month card. I spoke with refill team and they will leave one box of samples and free month card downstairs for patient to pick up. Patient notified of this and number for Eliquis assistance program provided to patient.

## 2019-12-05 NOTE — Telephone Encounter (Signed)
Pt c/o medication issue:  1. Name of Medication: apixaban (ELIQUIS) 5 MG TABS tablet  2. How are you currently taking this medication (dosage and times per day)? n/a  3. Are you having a reaction (difficulty breathing--STAT)? no  4. What is your medication issue? Patient states that this medication is $1500 for a 3 month supply. He states he will need to find something else to take.

## 2019-12-06 ENCOUNTER — Other Ambulatory Visit: Payer: Self-pay | Admitting: Internal Medicine

## 2019-12-06 MED ORDER — APIXABAN 5 MG PO TABS: 5.0000 mg | ORAL_TABLET | Freq: Two times a day (BID) | ORAL | 5 refills | Status: DC

## 2019-12-06 NOTE — Telephone Encounter (Signed)
I spoke with patient and let him know free month card had already been used. He reports he checked with his insurance and once deductible is met this month his cost for Eliquis will be $43 per month. He is able to afford this. A new prescription for 30 day supply was sent to his pharmacy earlier today.

## 2019-12-06 NOTE — Telephone Encounter (Signed)
Last OV 11/30/19 Scr 1.69  Are 64 years old 112kg

## 2019-12-06 NOTE — Telephone Encounter (Signed)
° °*  STAT* If patient is at the pharmacy, call can be transferred to refill team.   1. Which medications need to be refilled? (please list name of each medication and dose if known) apixaban (ELIQUIS) 5 MG TABS tablet  2. Which pharmacy/location (including street and city if local pharmacy) is medication to be sent to? [place], [place] [address]  3. Do they need a 30 day or 90 day supply? 30 days  Pt called, she said he spoke with his insurance and able to afford 30 day supply of eliquis

## 2019-12-07 ENCOUNTER — Encounter: Payer: Self-pay | Admitting: Internal Medicine

## 2019-12-07 ENCOUNTER — Ambulatory Visit (INDEPENDENT_AMBULATORY_CARE_PROVIDER_SITE_OTHER): Payer: Medicare Other | Admitting: Internal Medicine

## 2019-12-07 ENCOUNTER — Other Ambulatory Visit: Payer: Self-pay

## 2019-12-07 DIAGNOSIS — J8410 Pulmonary fibrosis, unspecified: Secondary | ICD-10-CM

## 2019-12-07 DIAGNOSIS — J449 Chronic obstructive pulmonary disease, unspecified: Secondary | ICD-10-CM | POA: Diagnosis not present

## 2019-12-07 DIAGNOSIS — R911 Solitary pulmonary nodule: Secondary | ICD-10-CM

## 2019-12-07 DIAGNOSIS — I255 Ischemic cardiomyopathy: Secondary | ICD-10-CM

## 2019-12-07 DIAGNOSIS — F17210 Nicotine dependence, cigarettes, uncomplicated: Secondary | ICD-10-CM

## 2019-12-07 DIAGNOSIS — J8489 Other specified interstitial pulmonary diseases: Secondary | ICD-10-CM | POA: Diagnosis not present

## 2019-12-07 MED ORDER — STIOLTO RESPIMAT 2.5-2.5 MCG/ACT IN AERS: 2.0000 | INHALATION_SPRAY | Freq: Every day | RESPIRATORY_TRACT | 0 refills | Status: DC

## 2019-12-07 MED ORDER — COMBIVENT RESPIMAT 20-100 MCG/ACT IN AERS: 1.0000 | INHALATION_SPRAY | RESPIRATORY_TRACT | 11 refills | Status: DC | PRN

## 2019-12-07 NOTE — Assessment & Plan Note (Signed)
HRCT 07/20/2016 1. Pulmonary parenchymal pattern of upper/mid lung zone predominant ground-glass coarsening and subpleural reticulation, unchanged from 07/06/2014 and suggestive of nonspecific interstitial pneumonitis. -repeat  CT chest  06/05/19    There is a spectrum of findings in the lungs concerning for interstitial lung disease, similar to the prior study. Findings appear stable compared to the prior examination, and at this time are categorized as indeterminate for usual interstitial pneumonia (UIP) per current ATS guidelines. The overall appearance favors nonspecific interstitial pneumonia  - 12/07/2019   Walked RA x two laps =  approx 551ft @ avg pace - stopped due to end of study, min sob with sats of 93 % at the end of the study.  Assoc with clubbing s obvious risk for collagen vasc dz or HSP so main concern is the ongoing cig smoking and asked the pt to continue to walk and monitor sats at highest level of activity especially if he notes a decline in ex tol over time.         Each maintenance medication was reviewed in detail including emphasizing most importantly the difference between maintenance and prns and under what circumstances the prns are to be triggered using an action plan format where appropriate.  Total time for H and P, chart review, counseling, teaching device/ directly observing portions of ambulatory 02 saturation study/  and generating customized AVS unique to this office visit / charting = 30 min

## 2019-12-07 NOTE — Patient Instructions (Addendum)
stiolto one - two  puffs each am to see if it helps - if you really like it we can do a presciption   Only use your combvient  as a rescue medication to be used if you can't catch your breath by resting or doing a relaxed purse lip breathing pattern.  - The less you use it, the better it will work when you need it. - Ok to use up to 1 puffs  every 4 hours if you must but call for immediate appointment if use goes up over your usual need - Don't leave home without it !!  (think of it like the spare tire for your car)   Also ok to try combivent  15 min before an activity that you know would make you short of breath and see if it makes any difference and if makes none then don't take it after activity unless you can't catch your breath.  The key is to stop smoking completely before smoking completely stops you!       Please schedule a follow up office visit in 6 months, call sooner if needed - bring inhaler with you

## 2019-12-07 NOTE — Assessment & Plan Note (Signed)
Active smoker 01/30/2016  try Bevespi 2bid > not really any better so pt d/cd - PFT's  07/24/2016  FEV1 2.42 ( 86  % ) ratio 78  p 9 % improvement from saba p nothing  prior to study with DLCO  67 % corrects to 89 % for alv volume   - PFT's  08/22/2018  FEV1 2.21 (80 % ) ratio 74  p 0 % improvement from saba p nothing prior to study with DLCO  60 % corrects to 80 % for alv volume   - 08/22/2018   Walked RA  2 laps @ 291ft each @ brisk pace  stopped due to  End of study, no desat, legs stopped before sob   - 12/07/2019  After extensive coaching inhaler device,  effectiveness =    90% with smi > try stiolto and if not improvement in symptoms then just use combivent prn    I spent extra time with pt today reviewing appropriate use of albuterol for prn use on exertion with the following points: 1) saba is for relief of sob that does not improve by walking a slower pace or resting but rather if the pt does not improve after trying this first. 2) If the pt is convinced, as many are, that saba helps recover from activity faster then it's easy to tell if this is the case by re-challenging : ie stop, take the inhaler, then p 5 minutes try the exact same activity (intensity of workload) that just caused the symptoms and see if they are substantially diminished or not after saba 3) if there is an activity that reproducibly causes the symptoms, try the saba 15 min before the activity on alternate days   If in fact the saba really does help, then fine to continue to use it prn but advised may need to look closer at the maintenance regimen (eg stiolto, since it's the same device) being used to achieve better control of airways disease with exertion.   >>> complete covid 19 vaccination as planned, f/u q 6 m, sooner if needed

## 2019-12-07 NOTE — Assessment & Plan Note (Signed)
Counseled re importance of smoking cessation but did not meet time criteria for separate billing   °

## 2019-12-07 NOTE — Progress Notes (Signed)
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 Pulmonary Consult    Pt needs pulmonary clearance for back surgery. He states his breathing is doing well and denies any respiratory co's at this time.   each am note some cough/ congestion/ gray brown x maybe a tbsp over the first hour, worst in winter. rec Stop smoking completely Prn saba  Did fine with back surgery and knee arthroscopy per Dr Tonita Cong      08/22/2018  f/u ov/Daniel Ball re: copd gold 0 / still smoking no maint rx  Chief Complaint  Patient presents with  . Follow-up    follows for COPD. uses albuterol a few times a week. feels like breathing is getting worse, gets short winded easy. decreased smoking to 10 cigarettes daily.   Dyspnea: MMRC3 = can't walk 100 yards even at a slow pace at a flat grade s stopping due to sob / legs hurting esp R hip Struggles to daughter's house and back, min elevation Cough: first thing am/ just to clear throat thick white < tsp  Sleeping: 10 degrees adj bed/ 1 pillow SABA use: not much at all   rec Please see patient coordinator before you leave today  to schedule High resolution CT chest Please remember to go to the lab department   for your tests - we will call you with the results when they are available. Work on stopping smoking and weight loss with Dr Darden Dates help     10/11/2018  f/u ov/Daniel Ball re:  GOLD 0 copd/ still smoking  / ? NSIP  Chief Complaint  Patient presents with  . Follow-up    breathing good   Dyspnea:  slt improve ex tol / walking neighborhoods ok  - can't do treadmill due  to hip Cough: just in am thick white Sleeping: 10 degrees electric bed SABA use: none  02: none  rec Monitor sats with ex     03/14/2019  f/u ov/Daniel Ball re: copd gold 0/ still smoking /? nsip  Chief Complaint  Patient presents with  . Follow-up    Review CT Chest  Dyspnea:  gxt x 12 min x  2.5 mph x flat x  Not checking sats as rec Cough: some at hs and in am x 20 min thick white Sleeping: flat/ two pillows SABA use: rarely  02: none rec To get the most out of exercise, you need to be continuously aware that you are short of breath  The key is to stop smoking completely before smoking completely stops you! Change prilosec to where you take it  Take 30-60 min before first meal of the day  GERD diet   06/08/2019  f/u ov/Daniel Ball re: GOLD 0/ still smoking no maint rx  Chief Complaint  Patient presents with  . Follow-up    Breathing is unchanged. He feels tired often. He is not using his albuterol inhaler.   Dyspnea:  200 yards to daughter's house slt uphill back to house and has to lean on the fence on the  way back Cough: mostly p cigs/ min mucoid  Sleeping: adustable bed, 10 degrees SABA use: rarely 02: none  rec No change rx;  LHC   11/02/19 p "over did it"  afib  1. Two-vessel coronary artery disease with 60% proximal LAD stenosis that is heavily calcified and hemodynamically significant (DFR = 0.86), as well as multifocal RCA disease with a long segment of 40% proximal stenosis and irregular distal lesion of up to 70-80% (not hemodynamically significant; DFR = 0.94).  There is also focal ostial stenosis involving the rPDA of approximately 70%. 2. Mildly reduced left ventricular systolic function with global hypokinesis (LVEF 45-50%). 3. Mildly elevated left and right heart filling pressures. 4. Mildly to moderately reduced Fick cardiac output/index. RA (mean): 11 mmHg RV (S/EDP): 33/11 mmHg PA (S/D, mean): 33/16 (22) mmHg PCWP (mean): 17 mmHg  Ao sat: 99% PA sat: 67%  Fick  CO: 4.1 L/min Fick CI: 1.9 L/min/m^2   12/07/2019  f/u ov/Daniel Ball re: GOLD 0 Daniel Ball / PAF but still smoking  Chief Complaint  Patient presents with  . Follow-up    Breathing has been worse since Feb 2021- has been seeing cards for this and was founf to have some blockages.   Dyspnea:  No change to daughter's house sev times a week  Cough: x sev months worse esp in am thick white  Sleeping: 10 degrees adjustable bed  SABA use: does not own one 02: none    No obvious day to day or daytime variability or assoc  purulent sputum or mucus plugs or hemoptysis or cp or chest tightness, subjective wheeze or overt sinus or hb symptoms.   Sleeping  without nocturnal    exacerbation  of respiratory  c/o's or need for noct saba. Also denies any obvious fluctuation of symptoms with weather or environmental changes or other aggravating or alleviating factors except as outlined above   No unusual exposure hx or h/o childhood pna/ asthma or knowledge of premature birth.  Current Allergies, Complete Past Medical History, Past Surgical History, Family History, and Social History were reviewed in [employer] record.  ROS  The following are not active complaints unless bolded Hoarseness, sore throat, dysphagia, dental problems, itching, sneezing,  nasal congestion or discharge of excess mucus or purulent secretions, ear ache,   fever, chills, sweats, unintended wt loss or wt gain, classically pleuritic or exertional cp,  orthopnea pnd or arm/hand swelling  or leg swelling, presyncope, palpitations, abdominal pain, anorexia, nausea, vomiting, diarrhea  or change in bowel habits or change in bladder habits, change in stools or change in urine, dysuria, hematuria,  rash, arthralgias, visual complaints, headache, numbness, weakness or ataxia or problems with walking or coordination,  change in mood or  memory.        Current Meds  Medication Sig  . amLODipine (NORVASC) 5 MG tablet Take 1.5  tablets (7.5 mg total) by mouth daily.  [name] apixaban (ELIQUIS) 5 MG TABS tablet Take 1 tablet (5 mg total) by mouth 2 (two) times daily.  [name] aspirin EC 81 MG tablet Take 81 mg by mouth daily.  [name] buPROPion (WELLBUTRIN XL) 300 MG 24 hr tablet Take 300 mg by mouth daily.  . cyclobenzaprine (FLEXERIL) 10 MG tablet Take 10 mg by mouth 3 (three) times daily as needed for muscle spasms.  . furosemide (LASIX) 40 MG tablet Take 1 tablet (40 mg total) by mouth every other day.  . metoprolol succinate (TOPROL XL) 25 MG 24 hr tablet  Take 1 tablet (25 mg total) by mouth daily.  . nitroGLYCERIN (NITROSTAT) 0.4 MG SL tablet Place 1 tablet (0.4 mg total) under the tongue every 5 (five) minutes as needed for chest pain.  [name] omeprazole (PRILOSEC) 20 MG capsule Take 20 mg by mouth daily.  . potassium chloride SA (KLOR-CON) 20 MEQ tablet Take 1 tablet (20 mEq total) by mouth every other day.  . rosuvastatin (CRESTOR) 10 MG tablet Take 10 mg by mouth daily.  . valsartan (DIOVAN) 320 MG tablet Take 320 mg by mouth daily.             Objective:   Physical Exam    amb obese wm coarse voice, rattling cough    12/07/2019    251 06/08/2019    252  03/14/2019    242  10/11/2018    251   08/22/2018   254   05/20/2018  240     01/09/14 244 lb (110.678 kg)  07/07/10 254 lb (115.214 kg)  05/02/10 252 lb (114.306 kg)  baseline 220 / 230 around 2010     Vital signs reviewed  12/07/2019  - Note at rest 02 sats  97% on RA      Reports: Edentulous  HEENT : pt wearing mask not removed for exam due to covid -19 concerns.    NECK :  without JVD/Nodes/TM/ nl carotid upstrokes bilaterally   LUNGS: no acc muscle use,  Nl contour chest with minimal insp/exp rhonchi  bilaterally without cough on insp or exp maneuvers   CV:  RRR  no s3 or murmur or increase in P2, and no edema   ABD:  Quite obese/ soft and nontender with nl inspiratory excursion in the supine position. No bruits or organomegaly appreciated, bowel sounds  nl  MS:  Nl gait/ ext warm without deformities, calf tenderness, cyanosis - Moderate  clubbing No obvious joint restrictions   SKIN: warm and dry without lesions    NEURO:  alert, approp, nl sensorium with  no motor or cerebellar deficits apparent.            Assessment & Plan:

## 2019-12-07 NOTE — Assessment & Plan Note (Signed)
See CT chest 03/13/19  RUL x 8 mm  -repeat  CT chest  06/05/19 1. Nodule of concern in the periphery of the right upper lobe seen on the prior examination has slightly regressed, suggesting a benign etiology. Likewise, the other nodule in the inferior aspect of the right upper lobe abutting the minor fissure is also stable. Follow-up noncontrast chest CT is recommended in 12 months to ensure continued stability of these findings. 2. There is a spectrum of findings in the lungs concerning for interstitial lung disease, similar to the prior study. Findings appear stable compared to the prior examination, and at this time are categorized as indeterminate for usual interstitial pneumonia (UIP) per current ATS guidelines. The overall appearance favors nonspecific interstitial pneumonia   >>> placed in reminder file for recall 06/04/2020  Associated with clubbing.  Discussed in detail all the  indications, usual  risks and alternatives  relative to the benefits with patient who agrees to proceed with f/u as outlined  / rec under fleischner [employer] guidelines

## 2019-12-12 DIAGNOSIS — I484 Atypical atrial flutter: Secondary | ICD-10-CM | POA: Diagnosis not present

## 2019-12-20 ENCOUNTER — Other Ambulatory Visit: Payer: Self-pay

## 2019-12-21 ENCOUNTER — Other Ambulatory Visit: Payer: Medicare Other | Admitting: *Deleted

## 2019-12-21 ENCOUNTER — Other Ambulatory Visit: Payer: Self-pay

## 2019-12-21 DIAGNOSIS — R0600 Dyspnea, unspecified: Secondary | ICD-10-CM | POA: Diagnosis not present

## 2019-12-21 DIAGNOSIS — Z79899 Other long term (current) drug therapy: Secondary | ICD-10-CM

## 2019-12-22 LAB — BASIC METABOLIC PANEL
BUN/Creatinine Ratio: 18 (ref 10–24)
BUN: 18 mg/dL (ref 8–27)
CO2: 23 mmol/L (ref 20–29)
Calcium: 9.4 mg/dL (ref 8.6–10.2)
Chloride: 99 mmol/L (ref 96–106)
Creatinine, Ser: 1.01 mg/dL (ref 0.76–1.27)
GFR calc Af Amer: 89 mL/min/{1.73_m2} (ref >59)
GFR calc non Af Amer: 77 mL/min/{1.73_m2} (ref >59)
Glucose: 127 mg/dL — ABNORMAL HIGH (ref 65–99)
Potassium: 4.3 mmol/L (ref 3.5–5.2)
Sodium: 139 mmol/L (ref 134–144)

## 2019-12-22 LAB — PRO B NATRIURETIC PEPTIDE: NT-Pro BNP: 255 pg/mL (ref 0–376)

## 2019-12-24 DIAGNOSIS — Z23 Encounter for immunization: Secondary | ICD-10-CM | POA: Diagnosis not present

## 2019-12-25 ENCOUNTER — Telehealth: Payer: Self-pay

## 2019-12-25 NOTE — Telephone Encounter (Signed)
Patient states nothing has changed with him after taking Toprol.States he still has problems with his breathing.next available apt with Dr.Ross made for 02/22/20 at 840am

## 2019-12-25 NOTE — Telephone Encounter (Signed)
-----   Message from Imogene Burn, PA-C sent at 12/25/2019  8:51 AM EDT ----- Monitor showed NSR with some Afib-mostly rate controlled. Ask how he's doing on toprol dose. Please arrange f/u with Dr. Harrington Challenger in the next month.. thanks

## 2019-12-26 NOTE — Telephone Encounter (Signed)
Pt notified of apt date change

## 2019-12-26 NOTE — Telephone Encounter (Signed)
Apt cancelled with Dr.Ross, f/u with M.Lenze for 4/12 at 1230 pm. LM for pt to call back,

## 2019-12-26 NOTE — Progress Notes (Deleted)
Cardiology Office Note    Date:  12/26/2019   ID:  Daniel Ball 01-05-1956, MRN [id number]  PCP:  Daniel Stains, MD  Cardiologist: [name], MD EPS: None  No chief complaint on file.   History of Present Illness:  Daniel Ball is a 64 y.o. male with history of HTN, COPD, OSA, CAD Cardiac cath performed 10/30/2019 revealed 60% proximal LAD that is heavily calcified and hemodynamically significant DFR 0.86 as well as a multifocal RCA disease with long segment of 40% proximal stenosis and irregular distal disease 70 to 80% not hemodynamically significant DFR 0.94.  Also focal ostial stenosis in the right PDA 70%, mild reduced LV function EF 45 to 50% mildly elevated right heart filling pressures.  Rec were for  optimizing medical therapy for CAD and cardiomyopathy.  If re angina persisted despite maximum tolerated doses of at least 2 antianginals PCI of the LAD could be considered.  Due to heavy calcification atherectomy would need to be considered.  Prior to discharge the patient went into A. fib with rate controlled.  Eliquis 5 mg twice daily was added for CHA2DS2-VASc of 3 hypertension CAD and CHF.   I saw him and he was having dyspnea on exertion and chest pain and I added amlodipine and placed a 0 monitor.  He followed up with Dr. Harrington Challenger 11/30/2018 and was still having dyspnea on exertion and was wheezing on exam.  She increased amlodipine to 7.5 mg daily and asked him to keep his follow-up with Dr. Melvyn Novas.  Monitor showed normal sinus rhythm with some A. fib mostly rate controlled  Past Medical History:  Diagnosis Date  . Arthritis   . COPD (chronic obstructive pulmonary disease) ([place])   . Coronary artery disease   . GERD (gastroesophageal reflux disease)   . Hyperlipidemia   . Hypertension    hx HBP - MEDS DC'D 10 YRS since BP has been in normal range  . Impingement syndrome of shoulder    left  . Shortness of breath    with exertion  . Sleep apnea    couldnt tolerate  CPAP  . Spinal stenosis     Past Surgical History:  Procedure Laterality Date  . EYE SURGERY Left   . HAND SURGERY Right    thumb  . INTRAVASCULAR PRESSURE WIRE/FFR STUDY N/A 11/02/2019   Procedure: INTRAVASCULAR PRESSURE WIRE/FFR STUDY;  Surgeon: Nelva Bush, MD;  Location: [facility];  Service: Cardiovascular;  Laterality: N/A;  . LUMBAR LAMINECTOMY/DECOMPRESSION MICRODISCECTOMY N/A 02/07/2014   Procedure: LUMBAR DECOMPRESSION Lumbar one-Lumbar five;  Surgeon: [name], MD;  Location: [facility];  Service: Orthopedics;  Laterality: N/A;  . RIGHT/LEFT HEART CATH AND CORONARY ANGIOGRAPHY N/A 11/02/2019   Procedure: RIGHT/LEFT HEART CATH AND CORONARY ANGIOGRAPHY;  Surgeon: Nelva Bush, MD;  Location: [facility];  Service: Cardiovascular;  Laterality: N/A;  . SHOULDER ARTHROSCOPY WITH SUBACROMIAL DECOMPRESSION Left 04/12/2014   Procedure: LEFT SHOULDER ARTHROSCOPY WITH SUBACROMIAL DECOMPRESSION AND LABRAL DEBRIDEMENT, ROTATOR CUFF DEBRIDEMENT, BICEPS DEBRIDEMENT;  Surgeon: [name], MD;  Location: [facility];  Service: Orthopedics;  Laterality: Left;    Current Medications: No outpatient medications have been marked as taking for the 01/01/20 encounter (Appointment) with Imogene Burn, PA-C.     Allergies:   Patient has no known allergies.   Social History   Socioeconomic History  . Marital status: Married    Spouse name: Not on file  . Number of children: Not on file  .  Years of education: Not on file  . Highest education level: Not on file  Occupational History  . Occupation: Pipefitter  Tobacco Use  . Smoking status: Current Every Day Smoker    Packs/day: 2.50    Years: 54.00    Pack years: 135.00    Types: Cigarettes  . Smokeless tobacco: Never Used  . Tobacco comment: 10 cigarettes a day  Substance and Sexual Activity  . Alcohol use: No  . Drug use: No  . Sexual activity: Not on file  Other Topics Concern  . Not on file  Social History  Narrative  . Not on file   Social Determinants of Health   Financial Resource Strain:   . Difficulty of Paying Living Expenses:   Food Insecurity:   . Worried About Charity fundraiser in the Last Year:   . Arboriculturist in the Last Year:   Transportation Needs:   . Film/video editor (Medical):   [name] Lack of Transportation (Non-Medical):   Physical Activity:   . Days of Exercise per Week:   . Minutes of Exercise per Session:   Stress:   . Feeling of Stress :   Social Connections:   . Frequency of Communication with Friends and Family:   . Frequency of Social Gatherings with Friends and Family:   . Attends Religious Services:   . Active Member of Clubs or Organizations:   . Attends Archivist Meetings:   [name] Marital Status:      Family History:  The patient's ***family history is not on file.   ROS:   Please see the history of present illness.    ROS All other systems reviewed and are negative.   PHYSICAL EXAM:   VS:  There were no vitals taken for this visit.  Physical Exam  GEN: Well nourished, well developed, in no acute distress  HEENT: normal  Neck: no JVD, carotid bruits, or masses Cardiac:RRR; no murmurs, rubs, or gallops  Respiratory:  clear to auscultation bilaterally, normal work of breathing GI: soft, nontender, nondistended, + BS Ext: without cyanosis, clubbing, or edema, Good distal pulses bilaterally MS: no deformity or atrophy  Skin: warm and dry, no rash Neuro:  Alert and Oriented x 3, Strength and sensation are intact Psych: euthymic mood, full affect  Wt Readings from Last 3 Encounters:  12/07/19 251 lb (113.9 kg)  11/30/19 249 lb (112.9 kg)  11/20/19 249 lb (112.9 kg)      Studies/Labs Reviewed:   EKG:  EKG is*** ordered today.  The ekg ordered today demonstrates ***  Recent Labs: 10/30/2019: Platelets 152 11/02/2019: Hemoglobin 14.6 12/21/2019: BUN 18; Creatinine, Ser 1.01; NT-Pro BNP 255; Potassium 4.3; Sodium 139   Lipid  Panel No results found for: CHOL, TRIG, HDL, CHOLHDL, VLDL, LDLCALC, LDLDIRECT  Additional studies/ records that were reviewed today include:   Zio monitor 12/20/19 Sinus rhythm and atrial fibrillatoin Rates: 44 to 166 bpm Average HR 71 bpm   2D echo 11/17/2019 IMPRESSIONS     1. Left ventricular ejection fraction, by estimation, is 60 to 65%. The  left ventricle has normal function. The left ventricle has no regional  wall motion abnormalities. There is mild concentric left ventricular  hypertrophy. Left ventricular diastolic  parameters are indeterminate.   2. Right ventricular systolic function is normal. The right ventricular  size is normal.   3. The mitral valve is grossly normal. Trivial mitral valve  regurgitation.   4. The aortic valve is  tricuspid. Aortic valve regurgitation is not  visualized. Mild aortic valve sclerosis is present, with no evidence of  aortic valve stenosis.   5. The inferior vena cava is normal in size with greater than 50%  respiratory variability, suggesting right atrial pressure of 3 mmHg.   FINDINGS   Left Ventricle: Left ventricular ejection fraction, by estimation, is 60  to 65%. The left ventricle has normal function. The left ventricle has no  regional wall motion abnormalities. The left ventricular internal cavity  size was normal in size. There is   mild concentric left ventricular hypertrophy. Left ventricular diastolic  parameters are indeterminate.   Right Ventricle: The right ventricular size is normal. No increase in  right ventricular wall thickness. Right ventricular systolic function is  normal.   Left Atrium: Left atrial size was normal in size.   Right Atrium: Right atrial size was normal in size.   Pericardium: There is no evidence of pericardial effusion.   Mitral Valve: The mitral valve is grossly normal. Mild mitral annular  calcification. Trivial mitral valve regurgitation.   Tricuspid Valve: The tricuspid valve is  grossly normal. Tricuspid valve  regurgitation is trivial.   Aortic Valve: The aortic valve is tricuspid. . There is mild thickening  and mild calcification of the aortic valve. Aortic valve regurgitation is  not visualized. Mild aortic valve sclerosis is present, with no evidence  of aortic valve stenosis. Moderate  aortic valve annular calcification. There is mild thickening of the aortic  valve. There is mild calcification of the aortic valve.   Pulmonic Valve: The pulmonic valve was grossly normal. Pulmonic valve  regurgitation is not visualized.   Aorta: The aortic root is normal in size and structure.   Venous: The inferior vena cava is normal in size with greater than 50%  respiratory variability, suggesting right atrial pressure of 3 mmHg.   IAS/Shunts: No atrial level shunt detected by color flow Doppler.      Cardiac cath 11/02/19 Conclusions: 1. Two-vessel coronary artery disease with 60% proximal LAD stenosis that is heavily calcified and hemodynamically significant (DFR = 0.86), as well as multifocal RCA disease with a long segment of 40% proximal stenosis and irregular distal lesion of up to 70-80% (not hemodynamically significant; DFR = 0.94).  There is also focal ostial stenosis involving the rPDA of approximately 70%. 2. Mildly reduced left ventricular systolic function with global hypokinesis (LVEF 45-50%). 3. Mildly elevated left and right heart filling pressures. 4. Mildly to moderately reduced Fick cardiac output/index.   Recommendations: 1. Optimize medical therapy for coronary artery disease and cardiomyopathy.  If Mr. Rendell has refractory angina despite maximal tolerated doses of at least 2 antianginal agents, PCI to the LAD could be considered.  Due to the heavy calcification, atherectomy would need to be considered. 2. I will add metoprolol succinate 12.5 mg daily, to be escalated as tolerated. 3. Aggressive medical therapy and risk factor modification to  prevent progression of CAD. 4. Obtain echocardiogram for further assessment of cardiomyopathy and other structural heart disease.   Nelva Bush, MD [facility]       ASSESSMENT:    No diagnosis found.   PLAN:  In order of problems listed above:  CAD with significant two-vessel disease on cath 10/30/2019.  Plan was to optimize medical therapy for CAD and cardiomyopathy.  If refractory angina despite maximal tolerated doses of at least 2 antianginals, PCI the LAD could be considered.  Due to heavy calcification atherectomy would need to  be considered.  Metoprolol added.  Patient still having significant dyspnea on exertion and chest discomfort with little activity.  Amlodipine has been titrated up..   Ischemic cardiomyopathy ejection fraction 45 to 50% on cath 10/30/2019.  2D echo 11/17/19: Normal LVEF and normal right heart   New onset atrial fibrillation with controlled rate post cath started on Eliquis for chads vas score of 3.  I increased metoprolol to 25 mg once daily and Zio monitor NSR with PAF 44-166/m average HR 71   Essential hypertension blood pressure has room to add amlodipine   Hyperlipidemia on Crestor 10 mg once daily   COPD with ongoing tobacco abuse has cut back to half pack daily but unable to cut back any further.  Has not had any success with nicotine patches in the past   OSA not on CPAP         Medication Adjustments/Labs and Tests Ordered: Current medicines are reviewed at length with the patient today.  Concerns regarding medicines are outlined above.  Medication changes, Labs and Tests ordered today are listed in the Patient Instructions below. There are no Patient Instructions on file for this visit.   Signed, Ermalinda Barrios, PA-C  12/26/2019 2:54 PM    [facility] [address], [place], [place]  [address] Phone: [phone]; Fax: [phone]

## 2019-12-26 NOTE — Telephone Encounter (Signed)
Please arrange f/u with APP this month. June 3 is too long to wait. thanks

## 2019-12-29 ENCOUNTER — Other Ambulatory Visit: Payer: Self-pay

## 2019-12-29 ENCOUNTER — Telehealth: Payer: Self-pay | Admitting: Internal Medicine

## 2019-12-29 ENCOUNTER — Encounter: Payer: Self-pay | Admitting: Cardiology

## 2019-12-29 ENCOUNTER — Ambulatory Visit (INDEPENDENT_AMBULATORY_CARE_PROVIDER_SITE_OTHER): Payer: Medicare Other | Admitting: Cardiology

## 2019-12-29 VITALS — BP 122/80 | HR 71 | Temp 97.0°F | Ht 65.0 in | Wt 249.6 lb

## 2019-12-29 DIAGNOSIS — I2510 Atherosclerotic heart disease of native coronary artery without angina pectoris: Secondary | ICD-10-CM

## 2019-12-29 DIAGNOSIS — Z7901 Long term (current) use of anticoagulants: Secondary | ICD-10-CM

## 2019-12-29 DIAGNOSIS — I480 Paroxysmal atrial fibrillation: Secondary | ICD-10-CM | POA: Diagnosis not present

## 2019-12-29 DIAGNOSIS — R0600 Dyspnea, unspecified: Secondary | ICD-10-CM

## 2019-12-29 DIAGNOSIS — Z720 Tobacco use: Secondary | ICD-10-CM

## 2019-12-29 DIAGNOSIS — I10 Essential (primary) hypertension: Secondary | ICD-10-CM

## 2019-12-29 DIAGNOSIS — I255 Ischemic cardiomyopathy: Secondary | ICD-10-CM

## 2019-12-29 MED ORDER — ISOSORBIDE MONONITRATE ER 60 MG PO TB24: 60.0000 mg | ORAL_TABLET | Freq: Every day | ORAL | 3 refills | Status: DC

## 2019-12-29 NOTE — Telephone Encounter (Signed)
ERROR

## 2019-12-29 NOTE — Patient Instructions (Addendum)
Medication Instructions:  Your physician recommends that you continue on your current medications as directed. Please refer to the Current Medication list given to you today.  Start Imdur take 1/2 tablet (30 mg) for two days then increase to (60 mg) one tablet Daily .  *If you need a refill on your cardiac medications before your next appointment, please call your pharmacy*   Lab Work: NONE  If you have labs (blood work) drawn today and your tests are completely normal, you will receive your results only by: [name] MyChart Message (if you have MyChart) OR . A paper copy in the mail If you have any lab test that is abnormal or we need to change your treatment, we will call you to review the results.   Testing/Procedures: NONE   Follow-Up: At [facility], you and your health needs are our priority.  As part of our continuing mission to provide you with exceptional heart care, we have created designated Provider Care Teams.  These Care Teams include your primary Cardiologist (physician) and Advanced Practice Providers (APPs -  Physician Assistants and Nurse Practitioners) who all work together to provide you with the care you need, when you need it.  We recommend signing up for the patient portal called "MyChart".  Sign up information is provided on this After Visit Summary.  MyChart is used to connect with patients for Virtual Visits (Telemedicine).  Patients are able to view lab/test results, encounter notes, upcoming appointments, etc.  Non-urgent messages can be sent to your provider as well.   To learn more about what you can do with MyChart, go to [email or web address].    Your next appointment:   2 week(s)  The format for your next appointment:   In Person  Provider:   You may see [name], MD or one of the following Advanced Practice Providers on your designated Care Team:    Bernerd Pho, PA-C   Ermalinda Barrios, PA-C     Other Instructions Thank you for choosing [facility]!

## 2019-12-29 NOTE — Progress Notes (Signed)
Cardiology Office Note   Date:  12/29/2019   ID:  [name], [name] July 19, 1956, MRN [id number]  PCP:  [name], MD  Cardiologist:  Dr. Harrington Challenger    Chief Complaint  Patient presents with  . Shortness of Breath      History of Present Illness: Daniel Ball is a 64 y.o. male who presents for DOE   He has a history of HTN, COPD, tobacco use, reported OSA  and CAD    He is followed by Dr Dema Severin at [facility].  He had previously seen Thurman Coyer  The pt had cardiac cath in 2005 that showed mild to moderate dz.  Stress cardiolite done in 2009   No ischemia detected but pt did not reach targe HR.   The pt says he has noticed a progressive SOB / DOE   Giving out much easier than he did in the past. Some chest pressure   He cut back on smoking  Down to 1/2 pack  Thought symptoms would get better  Instead, they have gotten worse  I saw the pt in Feb 2021   Concerned about symptoms of chest tightness and SOB    Recomm L heart cath.  Cardiac cath performed 10/30/2019 revealed 60% proximal LAD that is heavily calcified and hemodynamically significant DFR 0.86 as well as a multifocal RCA disease with long segment of 40% proximal stenosis and irregular distal disease 70 to 80% not hemodynamically significant DFR 0.94. Also focal ostial stenosis in the right PDA 70%, mild reduced LV function EF 45 to 50% mildly elevated right heart filling pressures. Rec were for  optimizing medical therapy for CAD and cardiomyopathy. If re angina persisted despite maximum tolerated doses of at least 2 antianginals PCI of the LAD could be considered. Due to heavy calcification atherectomy would need to be considered. Prior to discharge the patient went into A. fib with rate controlled. Eliquis 5 mg twice daily was added for CHA2DS2-VASc of 3 hypertension CAD and CHF.  The pt was seen by Gerrianne Scale on 11/20/19 Amlodipine added to regimen, metorprolol was increased to 25 mg   ZIo monitor placed  Since he was seen he  says when he is working outside he still gives out quickly with SOB and chest tightness   He does note some wheezing  With walking he does not have symptoms     Says he has not noted much difference with addition of meds  Still smoking but down to 10 cigs per day  but this is down from 3 ppd   The patient had a f/u appt with Selinda Orion on 3/18 He says he was told he did not have COPD   PFTs in 2019 showed minimial obstruction and moderate diffusion abnormal.  On that visit Dr. Melvyn Novas did not believe his lungs to be severe.    Today  Pt feels worse.  He cannot do much of anything.  To walk to mailbox he has to stop to catch his breath on way back.  He put out one bale of straw and had to stop 3 times and it would take 5-6 minutes to feel better.  All SOB, at rest no issues.  He does have som epigastric burning with SOB some of the time.  But nothing that he calls chest pain. He is very frustrated that he can not do what he needs to do.     Past Medical History:  Diagnosis Date  . Arthritis   .  COPD (chronic obstructive pulmonary disease) ([name])   . Coronary artery disease   . GERD (gastroesophageal reflux disease)   . Hyperlipidemia   . Hypertension    hx HBP - MEDS DC'D 10 YRS since BP has been in normal range  . Impingement syndrome of shoulder    left  . Shortness of breath    with exertion  . Sleep apnea    couldnt tolerate CPAP  . Spinal stenosis     Past Surgical History:  Procedure Laterality Date  . EYE SURGERY Left   . HAND SURGERY Right    thumb  . INTRAVASCULAR PRESSURE WIRE/FFR STUDY N/A 11/02/2019   Procedure: INTRAVASCULAR PRESSURE WIRE/FFR STUDY;  Surgeon: Nelva Bush, MD;  Location: [facility];  Service: Cardiovascular;  Laterality: N/A;  . LUMBAR LAMINECTOMY/DECOMPRESSION MICRODISCECTOMY N/A 02/07/2014   Procedure: LUMBAR DECOMPRESSION Lumbar one-Lumbar five;  Surgeon: [name], MD;  Location: [facility];  Service: Orthopedics;  Laterality: N/A;  .  RIGHT/LEFT HEART CATH AND CORONARY ANGIOGRAPHY N/A 11/02/2019   Procedure: RIGHT/LEFT HEART CATH AND CORONARY ANGIOGRAPHY;  Surgeon: Nelva Bush, MD;  Location: [facility];  Service: Cardiovascular;  Laterality: N/A;  . SHOULDER ARTHROSCOPY WITH SUBACROMIAL DECOMPRESSION Left 04/12/2014   Procedure: LEFT SHOULDER ARTHROSCOPY WITH SUBACROMIAL DECOMPRESSION AND LABRAL DEBRIDEMENT, ROTATOR CUFF DEBRIDEMENT, BICEPS DEBRIDEMENT;  Surgeon: [name], MD;  Location: [facility];  Service: Orthopedics;  Laterality: Left;     Current Outpatient Medications  Medication Sig Dispense Refill  . amLODipine (NORVASC) 5 MG tablet Take 1.5 tablets (7.5 mg total) by mouth daily. 135 tablet 3  . apixaban (ELIQUIS) 5 MG TABS tablet Take 1 tablet (5 mg total) by mouth 2 (two) times daily. 60 tablet 5  . aspirin EC 81 MG tablet Take 81 mg by mouth daily.    [name] buPROPion (WELLBUTRIN XL) 300 MG 24 hr tablet Take 300 mg by mouth daily.    . cyclobenzaprine (FLEXERIL) 10 MG tablet Take 10 mg by mouth 3 (three) times daily as needed for muscle spasms.    . furosemide (LASIX) 40 MG tablet Take 1 tablet (40 mg total) by mouth every other day. 15 tablet 1  . Ipratropium-Albuterol (COMBIVENT RESPIMAT) 20-100 MCG/ACT AERS respimat Inhale 1 puff into the lungs every 4 (four) hours as needed for wheezing. One puff up to 4x daily 4 g 11  . isosorbide mononitrate (IMDUR) 60 MG 24 hr tablet Take 1 tablet (60 mg total) by mouth daily. 90 tablet 3  . metoprolol succinate (TOPROL XL) 25 MG 24 hr tablet Take 1 tablet (25 mg total) by mouth daily. 30 tablet 9  . nitroGLYCERIN (NITROSTAT) 0.4 MG SL tablet Place 1 tablet (0.4 mg total) under the tongue every 5 (five) minutes as needed for chest pain. 25 tablet prn  . omeprazole (PRILOSEC) 20 MG capsule Take 20 mg by mouth daily.    . potassium chloride SA (KLOR-CON) 20 MEQ tablet Take 1 tablet (20 mEq total) by mouth every other day. 15 tablet 1  . rosuvastatin (CRESTOR) 10 MG  tablet Take 10 mg by mouth daily.    . Tiotropium Bromide-Olodaterol (STIOLTO RESPIMAT) 2.5-2.5 MCG/ACT AERS Inhale 2 puffs into the lungs daily. 4 g 0  . valsartan (DIOVAN) 320 MG tablet Take 320 mg by mouth daily.     No current facility-administered medications for this visit.    Allergies:   Patient has no known allergies.    Social History:  The patient  reports that he has been smoking cigarettes. He has a 135.00 pack-year smoking history. He has never used smokeless tobacco. He reports that he does not drink alcohol or use drugs.   Family History:  The patient's Family history is unknown by patient.  Family history unknown   ROS:  General:no colds or fevers, no weight changes Skin:no rashes or ulcers HEENT:no blurred vision, no congestion CV:see HPI PUL:see HPI GI:no diarrhea constipation or melena, no indigestion GU:no hematuria, no dysuria MS:no joint pain, no claudication Neuro:no syncope, no lightheadedness Endo:no diabetes, no thyroid disease  Wt Readings from Last 3 Encounters:  12/29/19 249 lb 9.6 oz (113.2 kg)  12/07/19 251 lb (113.9 kg)  11/30/19 249 lb (112.9 kg)     PHYSICAL EXAM: VS:  BP 122/80   Pulse 71   Temp (!) 97 F (36.1 C)   Ht 5\' 5"  (1.651 m)   Wt 249 lb 9.6 oz (113.2 kg)   SpO2 98%   BMI 41.54 kg/m  , BMI Body mass index is 41.54 kg/m. General:Pleasant affect, NAD Skin:Warm and dry, brisk capillary refill HEENT:normocephalic, sclera clear, mucus membranes moist Neck:supple, no JVD, no bruits  Heart:S1S2 RRR without murmur, gallup, rub or click Lungs:Without rales, occ rhonchi, and wheezes WLS:LHTD, non tender, + BS, do not palpate liver spleen or masses Ext:no lower ext edema, 2+ pedal pulses, 2+ radial pulses Neuro:alert and oriented X 3, MAE, follows commands, + facial symmetry    EKG:  EKG is NOT ordered today.   Recent Labs: 10/30/2019: Platelets 152 11/02/2019: Hemoglobin 14.6 12/21/2019: BUN 18; Creatinine, Ser 1.01; NT-Pro  BNP 255; Potassium 4.3; Sodium 139    Lipid Panel No results found for: CHOL, TRIG, HDL, CHOLHDL, VLDL, LDLCALC, LDLDIRECT     Other studies Reviewed: Additional studies/ records that were reviewed today include: . Rt and Lt cardiac cath 11/02/19 Conclusions: 1. Two-vessel coronary artery disease with 60% proximal LAD stenosis that is heavily calcified and hemodynamically significant (DFR = 0.86), as well as multifocal RCA disease with a long segment of 40% proximal stenosis and irregular distal lesion of up to 70-80% (not hemodynamically significant; DFR = 0.94).  There is also focal ostial stenosis involving the rPDA of approximately 70%. 2. Mildly reduced left ventricular systolic function with global hypokinesis (LVEF 45-50%). 3. Mildly elevated left and right heart filling pressures. 4. Mildly to moderately reduced Fick cardiac output/index.  Recommendations: 1. Optimize medical therapy for coronary artery disease and cardiomyopathy.  If Mr. Silas has refractory angina despite maximal tolerated doses of at least 2 antianginal agents, PCI to the LAD could be considered.  Due to the heavy calcification, atherectomy would need to be considered. 2. I will add metoprolol succinate 12.5 mg daily, to be escalated as tolerated. 3. Aggressive medical therapy and risk factor modification to prevent progression of CAD. 4. Obtain echocardiogram for further assessment of cardiomyopathy and other structural heart disease.   ASSESSMENT AND PLAN:  1.  DOE probable anginal equivalent, after review of chart including Dr. Gustavus Bryant note,  Pt's complaints of feeling worse, I called Dr. Harrington Challenger we reviewed pt's information and she discussed with Dr. Saunders Revel.  Our plan will be to add imdur 30 mg for 2 days then 60 mg daily.  He will follow up in 2 weeks and if no improvement then plan for PCI.  I reviewed with pt it would be difficult procedure. He is willing to try meds.but is glad PCI is a possibility    2.  PAF and no awareness of a fib at home.  Here Heart rate is regular.  3.  HTN controlled continue meds  4.  HLD on crestor continue.   5.  Tobacco use - is down to 10 cigarettes per day.from 3 ppd   6.  Anticoagulation on Eliquis       Current medicines are reviewed with the patient today.  The patient Has no concerns regarding medicines.  The following changes have been made:  See above Labs/ tests ordered today include:see above  Disposition:   FU:  see above  Signed, Cecilie Kicks, NP  12/29/2019 4:24 PM    [facility] [place], [place], [place], [place] Phone: [phone]; Fax: [phone]

## 2020-01-01 ENCOUNTER — Ambulatory Visit: Payer: Medicare Other | Admitting: Physician Assistant

## 2020-01-04 DIAGNOSIS — G4730 Sleep apnea, unspecified: Secondary | ICD-10-CM | POA: Diagnosis not present

## 2020-01-04 DIAGNOSIS — R591 Generalized enlarged lymph nodes: Secondary | ICD-10-CM | POA: Diagnosis not present

## 2020-01-04 DIAGNOSIS — I10 Essential (primary) hypertension: Secondary | ICD-10-CM | POA: Diagnosis not present

## 2020-01-04 DIAGNOSIS — J849 Interstitial pulmonary disease, unspecified: Secondary | ICD-10-CM | POA: Diagnosis not present

## 2020-01-04 DIAGNOSIS — F17200 Nicotine dependence, unspecified, uncomplicated: Secondary | ICD-10-CM | POA: Diagnosis not present

## 2020-01-04 DIAGNOSIS — J449 Chronic obstructive pulmonary disease, unspecified: Secondary | ICD-10-CM | POA: Diagnosis not present

## 2020-01-04 DIAGNOSIS — I25118 Atherosclerotic heart disease of native coronary artery with other forms of angina pectoris: Secondary | ICD-10-CM | POA: Diagnosis not present

## 2020-01-05 ENCOUNTER — Telehealth: Payer: Self-pay | Admitting: Internal Medicine

## 2020-01-05 MED ORDER — ALBUTEROL SULFATE HFA 108 (90 BASE) MCG/ACT IN AERS: 2.0000 | INHALATION_SPRAY | RESPIRATORY_TRACT | 11 refills | Status: DC | PRN

## 2020-01-05 NOTE — Telephone Encounter (Signed)
Spoke with pt, states that his Combivent is over $200/month copay.    [facility] and they verified that rx is covered by insurance but just has a high copay.    Pt states he cannot afford this, would like a cheaper alternative.    MW please advise if you want to switch Combivent to another inhaler.  Thanks!

## 2020-01-05 NOTE — Telephone Encounter (Signed)
Spoke with pt, aware of recs.  Albuterol sent to pharmacy.  Nothing further needed at this time- will close encounter.

## 2020-01-05 NOTE — Telephone Encounter (Signed)
Albuterol 2 puffs q 4h prn  # 11 refills

## 2020-01-11 ENCOUNTER — Ambulatory Visit: Payer: Medicare Other | Admitting: Student

## 2020-01-12 ENCOUNTER — Ambulatory Visit (INDEPENDENT_AMBULATORY_CARE_PROVIDER_SITE_OTHER): Payer: Medicare Other | Admitting: Student

## 2020-01-12 ENCOUNTER — Other Ambulatory Visit: Payer: Self-pay

## 2020-01-12 ENCOUNTER — Encounter: Payer: Self-pay | Admitting: Student

## 2020-01-12 VITALS — BP 130/80 | HR 74 | Temp 97.8°F | Ht 65.0 in | Wt 251.0 lb

## 2020-01-12 DIAGNOSIS — I255 Ischemic cardiomyopathy: Secondary | ICD-10-CM | POA: Diagnosis not present

## 2020-01-12 DIAGNOSIS — I10 Essential (primary) hypertension: Secondary | ICD-10-CM | POA: Diagnosis not present

## 2020-01-12 DIAGNOSIS — Z720 Tobacco use: Secondary | ICD-10-CM

## 2020-01-12 DIAGNOSIS — I25119 Atherosclerotic heart disease of native coronary artery with unspecified angina pectoris: Secondary | ICD-10-CM

## 2020-01-12 DIAGNOSIS — R0609 Other forms of dyspnea: Secondary | ICD-10-CM

## 2020-01-12 DIAGNOSIS — E785 Hyperlipidemia, unspecified: Secondary | ICD-10-CM | POA: Diagnosis not present

## 2020-01-12 DIAGNOSIS — R0600 Dyspnea, unspecified: Secondary | ICD-10-CM

## 2020-01-12 NOTE — Progress Notes (Signed)
Cardiology Office Note    Date:  01/13/2020   ID:  Damarrion, Mimbs 22-Jan-1956, MRN [id number]  PCP:  [name], MD  Cardiologist: [name], MD    Chief Complaint  Patient presents with  . Follow-up    2 week visit    History of Present Illness:    LEAH SKORA is a 64 y.o. male with past medical history of CAD (s/p cath in 10/2019 showing 2-vessel CAD with 60% proximal LAD stenosis and multifocal RCA disease with a long segment of 40% proximal stenosis and irregular distal lesion of up to 70-80% which were not significant by FFR and medical management was recommended and consideration of atherectomy of LAD if refractory symptoms, paroxysmal atrial fibrillation, HTN, HLD and tobacco use who presents to the office today for 2-week follow-up.  He was last examined by Cecilie Kicks, NP on 12/29/2019 and reported still having dyspnea on exertion but no specific chest discomfort. He had reduced his tobacco use from 3 packs/day down to 10 cigarettes/day. He had been evaluated by Pulmonology and symptoms were not felt to be secondary to COPD. Options were reviewed with Dr. Harrington Challenger along with Dr. Saunders Revel and the plan was to add Imdur 30 mg daily and titrate to 60 mg daily and follow-up in 2 weeks. If no improvement, would then plan for PCI of the LAD. He did have an event monitor which resulted in the interim and showed intermittent episodes of sinus rhythm and atrial fibrillation with variable rates and an average heart rate of 71 bpm.  In talking with the patient today, he continues to have dyspnea on exertion with minimal activities such as household chores or walking to the mailbox. Says he has been short of breath for years but symptoms have acutely worsened within the past few months. No specific chest pain but he does get a discomfort along his chest at times when walking. No reported orthopnea, PND or lower extremity edema.   He did titrate Imdur to 60mg  daily and did not notice  improvement in his symptoms. Says he had headaches and dizziness with the change and these are still present but have started to improve.    Past Medical History:  Diagnosis Date  . Arthritis   . COPD (chronic obstructive pulmonary disease) ([place])   . Coronary artery disease   . GERD (gastroesophageal reflux disease)   . Hyperlipidemia   . Hypertension    hx HBP - MEDS DC'D 10 YRS since BP has been in normal range  . Impingement syndrome of shoulder    left  . Shortness of breath    with exertion  . Sleep apnea    couldnt tolerate CPAP  . Spinal stenosis     Past Surgical History:  Procedure Laterality Date  . EYE SURGERY Left   . HAND SURGERY Right    thumb  . INTRAVASCULAR PRESSURE WIRE/FFR STUDY N/A 11/02/2019   Procedure: INTRAVASCULAR PRESSURE WIRE/FFR STUDY;  Surgeon: Nelva Bush, MD;  Location: [facility];  Service: Cardiovascular;  Laterality: N/A;  . LUMBAR LAMINECTOMY/DECOMPRESSION MICRODISCECTOMY N/A 02/07/2014   Procedure: LUMBAR DECOMPRESSION Lumbar one-Lumbar five;  Surgeon: [name], MD;  Location: [facility];  Service: Orthopedics;  Laterality: N/A;  . RIGHT/LEFT HEART CATH AND CORONARY ANGIOGRAPHY N/A 11/02/2019   Procedure: RIGHT/LEFT HEART CATH AND CORONARY ANGIOGRAPHY;  Surgeon: Nelva Bush, MD;  Location: [facility];  Service: Cardiovascular;  Laterality: N/A;  . SHOULDER ARTHROSCOPY WITH SUBACROMIAL  DECOMPRESSION Left 04/12/2014   Procedure: LEFT SHOULDER ARTHROSCOPY WITH SUBACROMIAL DECOMPRESSION AND LABRAL DEBRIDEMENT, ROTATOR CUFF DEBRIDEMENT, BICEPS DEBRIDEMENT;  Surgeon: [name], MD;  Location: [facility];  Service: Orthopedics;  Laterality: Left;    Current Medications: Outpatient Medications Prior to Visit  Medication Sig Dispense Refill  . albuterol (VENTOLIN HFA) 108 (90 Base) MCG/ACT inhaler Inhale 2 puffs into the lungs every 4 (four) hours as needed for wheezing or shortness of breath. 18 g 11  . amLODipine  (NORVASC) 5 MG tablet Take 1.5 tablets (7.5 mg total) by mouth daily. 135 tablet 3  . apixaban (ELIQUIS) 5 MG TABS tablet Take 1 tablet (5 mg total) by mouth 2 (two) times daily. 60 tablet 5  . aspirin EC 81 MG tablet Take 81 mg by mouth daily.    [name] buPROPion (WELLBUTRIN XL) 300 MG 24 hr tablet Take 300 mg by mouth daily.    . cyclobenzaprine (FLEXERIL) 10 MG tablet Take 10 mg by mouth 3 (three) times daily as needed for muscle spasms.    . furosemide (LASIX) 40 MG tablet Take 1 tablet (40 mg total) by mouth every other day. 15 tablet 1  . isosorbide mononitrate (IMDUR) 60 MG 24 hr tablet Take 1 tablet (60 mg total) by mouth daily. 90 tablet 3  . metoprolol succinate (TOPROL XL) 25 MG 24 hr tablet Take 1 tablet (25 mg total) by mouth daily. 30 tablet 9  . nitroGLYCERIN (NITROSTAT) 0.4 MG SL tablet Place 1 tablet (0.4 mg total) under the tongue every 5 (five) minutes as needed for chest pain. 25 tablet prn  . omeprazole (PRILOSEC) 20 MG capsule Take 20 mg by mouth daily.    . potassium chloride SA (KLOR-CON) 20 MEQ tablet Take 1 tablet (20 mEq total) by mouth every other day. 15 tablet 1  . rosuvastatin (CRESTOR) 10 MG tablet Take 10 mg by mouth daily.    . Tiotropium Bromide-Olodaterol (STIOLTO RESPIMAT) 2.5-2.5 MCG/ACT AERS Inhale 2 puffs into the lungs daily. 4 g 0  . valsartan (DIOVAN) 320 MG tablet Take 320 mg by mouth daily.     No facility-administered medications prior to visit.     Allergies:   Patient has no known allergies.   Social History   Socioeconomic History  . Marital status: Married    Spouse name: Not on file  . Number of children: Not on file  . Years of education: Not on file  . Highest education level: Not on file  Occupational History  . Occupation: Pipefitter  Tobacco Use  . Smoking status: Current Every Day Smoker    Packs/day: 2.50    Years: 54.00    Pack years: 135.00    Types: Cigarettes  . Smokeless tobacco: Never Used  . Tobacco comment: 10  cigarettes a day  Substance and Sexual Activity  . Alcohol use: No  . Drug use: No  . Sexual activity: Not on file  Other Topics Concern  . Not on file  Social History Narrative  . Not on file   Social Determinants of Health   Financial Resource Strain:   . Difficulty of Paying Living Expenses:   Food Insecurity:   . Worried About Charity fundraiser in the Last Year:   . Arboriculturist in the Last Year:   Transportation Needs:   . Film/video editor (Medical):   [name] Lack of Transportation (Non-Medical):   Physical Activity:   . Days of Exercise per Week:   .  Minutes of Exercise per Session:   Stress:   . Feeling of Stress :   Social Connections:   . Frequency of Communication with Friends and Family:   . Frequency of Social Gatherings with Friends and Family:   . Attends Religious Services:   . Active Member of Clubs or Organizations:   . Attends Archivist Meetings:   [name] Marital Status:      Family History: Family history is unknown by patient.   Review of Systems:   Please see the history of present illness.     General:  No chills, fever, night sweats or weight changes.  Cardiovascular:  No edema, orthopnea, palpitations, paroxysmal nocturnal dyspnea. Positive for dyspnea on exertion and chest discomfort.  Dermatological: No rash, lesions/masses Respiratory: No cough, dyspnea Urologic: No hematuria, dysuria Abdominal:   No nausea, vomiting, diarrhea, bright red blood per rectum, melena, or hematemesis Neurologic:  No visual changes, wkns, changes in mental status. All other systems reviewed and are otherwise negative except as noted above.   Physical Exam:    VS:  BP 130/80 (BP Location: Left Arm)   Pulse 74   Temp 97.8 F (36.6 C)   Ht 5\' 5"  (1.651 m)   Wt 251 lb (113.9 kg)   SpO2 95%   BMI 41.77 kg/m    General: Well developed, well nourished,male appearing in no acute distress. Head: Normocephalic, atraumatic, sclera non-icteric.  Neck:  No carotid bruits. JVD not elevated.  Lungs: Respirations regular and unlabored, without wheezes or rales.  Heart: Regular rate and rhythm. No S3 or S4.  No murmur, no rubs, or gallops appreciated. Abdomen: Soft, non-tender, non-distended. No obvious abdominal masses. Msk:  Strength and tone appear normal for age. No obvious joint deformities or effusions. Extremities: No clubbing or cyanosis. No lower extremity edema.  Distal pedal pulses are 2+ bilaterally. Neuro: Alert and oriented X 3. Moves all extremities spontaneously. No focal deficits noted. Psych:  Responds to questions appropriately with a normal affect. Skin: No rashes or lesions noted  Wt Readings from Last 3 Encounters:  01/12/20 251 lb (113.9 kg)  12/29/19 249 lb 9.6 oz (113.2 kg)  12/07/19 251 lb (113.9 kg)     Studies/Labs Reviewed:   EKG:  EKG is not ordered today.   Recent Labs: 10/30/2019: Platelets 152 11/02/2019: Hemoglobin 14.6 12/21/2019: BUN 18; Creatinine, Ser 1.01; NT-Pro BNP 255; Potassium 4.3; Sodium 139   Lipid Panel No results found for: CHOL, TRIG, HDL, CHOLHDL, VLDL, LDLCALC, LDLDIRECT  Additional studies/ records that were reviewed today include:   Echocardiogram: 10/2019 IMPRESSIONS    1. Left ventricular ejection fraction, by estimation, is 60 to 65%. The  left ventricle has normal function. The left ventricle has no regional  wall motion abnormalities. There is mild concentric left ventricular  hypertrophy. Left ventricular diastolic  parameters are indeterminate.  2. Right ventricular systolic function is normal. The right ventricular  size is normal.  3. The mitral valve is grossly normal. Trivial mitral valve  regurgitation.  4. The aortic valve is tricuspid. Aortic valve regurgitation is not  visualized. Mild aortic valve sclerosis is present, with no evidence of  aortic valve stenosis.  5. The inferior vena cava is normal in size with greater than 50%  respiratory variability,  suggesting right atrial pressure of 3 mmHg.   Cardiac Catheterization: 10/2019  Conclusions: 1. Two-vessel coronary artery disease with 60% proximal LAD stenosis that is heavily calcified and hemodynamically significant (DFR = 0.86), as well  as multifocal RCA disease with a long segment of 40% proximal stenosis and irregular distal lesion of up to 70-80% (not hemodynamically significant; DFR = 0.94).  There is also focal ostial stenosis involving the rPDA of approximately 70%. 2. Mildly reduced left ventricular systolic function with global hypokinesis (LVEF 45-50%). 3. Mildly elevated left and right heart filling pressures. 4. Mildly to moderately reduced Fick cardiac output/index.  Recommendations: 1. Optimize medical therapy for coronary artery disease and cardiomyopathy.  If Mr. Reitter has refractory angina despite maximal tolerated doses of at least 2 antianginal agents, PCI to the LAD could be considered.  Due to the heavy calcification, atherectomy would need to be considered. 2. I will add metoprolol succinate 12.5 mg daily, to be escalated as tolerated. 3. Aggressive medical therapy and risk factor modification to prevent progression of CAD. Obtain echocardiogram for further assessment of cardiomyopathy and other structural heart disease.  Event Monitor: 12/2019 Sinus rhythm and atrial fibrillatoin   Rates:   44 to 166 bpm   Average HR 71 bpm    Assessment:    1. Coronary artery disease involving native coronary artery of native heart with angina pectoris ([place])   2. Dyspnea on exertion   3. Essential hypertension   4. Hyperlipidemia LDL goal <70   5. Tobacco use      Plan:   In order of problems listed above:  1. CAD/Dyspnea on Exertion - recent catheterization in 10/2019 showed 2-vessel CAD with 60% proximal LAD stenosis and multifocal RCA disease with a long segment of 40% proximal stenosis and irregular distal lesion of up to 70-80% which were not significant by FFR and  medical management was recommended and consideration of atherectomy of LAD if refractory symptoms.  - he has remained on Toprol-XL and Amlodipine with Imdur being initiated two weeks ago and now on 60mg  daily. Per his report, symptoms have not improved as he continues to have dyspnea on exertion with minimal activity. I did not further titrate Imdur today as he reports having dizziness and headaches with this. Will likely need to reduce back to 30mg  daily or discontinue in the future if side-effects do not improve.  - by review of his last office note, the plan was to arrange for PCI of the LAD if symptoms did not improve. Reviewed catheterization in detail and he wishes to proceed. The patient understands that risks include but are not limited to stroke (1 in 1000), death (1 in 68), kidney failure [usually temporary] (1 in 500), bleeding (1 in 200), allergic reaction [possibly serious] (1 in 200). Dr. Darnelle Bos prior notes had mentiond the patient would likely require coronary atherectomy. Will send a note to him and Dr. Harrington Challenger today to verify. I did have nursing staff book the procedure as such with the cath lab but this can be updated if needed. Will check pre-procedure labs and arrange for COVID testing.  - continue ASA, statin, Imdur, BB and Amlodipine at current dosing for now.   2. Paroxysmal Atrial Fibrillation - he denies any recent palpitations. Monitor reviewed which did show episodes of atrial fibrillation with a burden of 30% and average HR while in atrial fibrillation was 84 bpm. Average of 71 bpm for duration of the monitor. Continue Toprol-XL 25mg  daily for rate-control. Would not further titrate as HR was in the 40's at times by review of his monitor.  - he denies any evidence of active bleeding. Will recheck CBC with pre-procedure labs. Continue Eliquis 5mg  BID. Will need to  hold Eliquis 48 hours prior to his catheterization.   3. HTN - BP is well-controlled at 130/80 during today's visit.  Continue current regimen with Amlodipine 7.5mg  daily, Imdur 60mg  daily, and Toprol-XL 25mg  daily.   4. HLD - Overdue for fasting lipids and this needs to be obtained. Order for this is in the system. Remains on Crestor 10mg  daily and goal LDL is less than 70 with known CAD.   5. Tobacco Use - he was previously smoking 3 ppd, now down to 10 cigarettes per day. Congratulated on this with continued reduction advised.    Medication Adjustments/Labs and Tests Ordered: Current medicines are reviewed at length with the patient today.  Concerns regarding medicines are outlined above.  Medication changes, Labs and Tests ordered today are listed in the Patient Instructions below. Patient Instructions  Medication Instructions:  Your physician recommends that you continue on your current medications as directed. Please refer to the Current Medication list given to you today.  *If you need a refill on your cardiac medications before your next appointment, please call your pharmacy*   Lab Work: Your physician recommends that you return for lab work in: 01/16/20   If you have labs (blood work) drawn today and your tests are completely normal, you will receive your results only by: [name] MyChart Message (if you have MyChart) OR . A paper copy in the mail If you have any lab test that is abnormal or we need to change your treatment, we will call you to review the results.   Testing/Procedures: Your physician has requested that you have a cardiac catheterization. Cardiac catheterization is used to diagnose and/or treat various heart conditions. Doctors may recommend this procedure for a number of different reasons. The most common reason is to evaluate chest pain. Chest pain can be a symptom of coronary artery disease (CAD), and cardiac catheterization can show whether plaque is narrowing or blocking your heart's arteries. This procedure is also used to evaluate the valves, as well as measure the blood flow and  oxygen levels in different parts of your heart. For further information please visit [email or web address]. Please follow instruction sheet, as given.     Follow-Up: At [facility], you and your health needs are our priority.  As part of our continuing mission to provide you with exceptional heart care, we have created designated Provider Care Teams.  These Care Teams include your primary Cardiologist (physician) and Advanced Practice Providers (APPs -  Physician Assistants and Nurse Practitioners) who all work together to provide you with the care you need, when you need it.  We recommend signing up for the patient portal called "MyChart".  Sign up information is provided on this After Visit Summary.  MyChart is used to connect with patients for Virtual Visits (Telemedicine).  Patients are able to view lab/test results, encounter notes, upcoming appointments, etc.  Non-urgent messages can be sent to your provider as well.   To learn more about what you can do with MyChart, go to [email or web address].    Your next appointment:   2 week(s)  The format for your next appointment:   In Person  Provider:   You may see [name], MD or one of the following Advanced Practice Providers on your designated Care Team:    Bernerd Pho, PA-C   Ermalinda Barrios, PA-C     Other Instructions Thank you for choosing [facility]!     [address]  [place] [address] Dept: [phone] Loc: [place]  01/12/2020  You are scheduled for a Cardiac Catheterization on Thursday, April 29 with Dr. Harrell Gave End.  1. Please arrive at the [facility] (Main Entrance A) at [facility]: [place], [place] [address] at 5:30 AM (This time is two hours before your procedure to ensure your preparation). Free valet parking service is available.   Special note:  Every effort is made to have your procedure done on time. Please understand that emergencies sometimes delay scheduled procedures.  2. Diet: Do not eat solid foods after midnight.  The patient may have clear liquids until 5am upon the day of the procedure.  3. Labs: You will need to have blood drawn on Tuesday, April 27 at [facility]. [address], [place]  Open: 7am - 6pm, Sat 8am - 12 noon   Phone: [phone]. You do not need to be fasting.  4. Medication instructions in preparation for your procedure:   Contrast Allergy: No   Stop taking Eliquis (Apixiban) on Monday, April 26. Do not take Lasix the morning of your procedure    On the morning of your procedure, take your Aspirin and any morning medicines NOT listed above.  You may use sips of water.  5. Plan for one night stay--bring personal belongings. 6. Bring a current list of your medications and current insurance cards. 7. You MUST have a responsible person to drive you home. 8. Someone MUST be with you the first 24 hours after you arrive home or your discharge will be delayed. 9. Please wear clothes that are easy to get on and off and wear slip-on shoes.  Thank you for allowing [place] to care for you!   -- [facility]    Signed, Erma Heritage, Hershal Coria  01/13/2020 1:34 PM    [address] [place], [place] [address] Phone: [phone] Fax: [phone]

## 2020-01-12 NOTE — H&P (View-Only) (Signed)
Cardiology Office Note    Date:  01/13/2020   ID:  Con, Arganbright Jan 14, 1956, MRN [id number]  PCP:  [name], MD  Cardiologist: [name], MD    Chief Complaint  Patient presents with  . Follow-up    2 week visit    History of Present Illness:    Daniel Ball is a 64 y.o. male with past medical history of CAD (s/p cath in 10/2019 showing 2-vessel CAD with 60% proximal LAD stenosis and multifocal RCA disease with a long segment of 40% proximal stenosis and irregular distal lesion of up to 70-80% which were not significant by FFR and medical management was recommended and consideration of atherectomy of LAD if refractory symptoms, paroxysmal atrial fibrillation, HTN, HLD and tobacco use who presents to the office today for 2-week follow-up.  He was last examined by Cecilie Kicks, NP on 12/29/2019 and reported still having dyspnea on exertion but no specific chest discomfort. He had reduced his tobacco use from 3 packs/day down to 10 cigarettes/day. He had been evaluated by Pulmonology and symptoms were not felt to be secondary to COPD. Options were reviewed with Dr. Harrington Challenger along with Dr. Saunders Revel and the plan was to add Imdur 30 mg daily and titrate to 60 mg daily and follow-up in 2 weeks. If no improvement, would then plan for PCI of the LAD. He did have an event monitor which resulted in the interim and showed intermittent episodes of sinus rhythm and atrial fibrillation with variable rates and an average heart rate of 71 bpm.  In talking with the patient today, he continues to have dyspnea on exertion with minimal activities such as household chores or walking to the mailbox. Says he has been short of breath for years but symptoms have acutely worsened within the past few months. No specific chest pain but he does get a discomfort along his chest at times when walking. No reported orthopnea, PND or lower extremity edema.   He did titrate Imdur to 60mg  daily and did not notice  improvement in his symptoms. Says he had headaches and dizziness with the change and these are still present but have started to improve.    Past Medical History:  Diagnosis Date  . Arthritis   . COPD (chronic obstructive pulmonary disease) ([place])   . Coronary artery disease   . GERD (gastroesophageal reflux disease)   . Hyperlipidemia   . Hypertension    hx HBP - MEDS DC'D 10 YRS since BP has been in normal range  . Impingement syndrome of shoulder    left  . Shortness of breath    with exertion  . Sleep apnea    couldnt tolerate CPAP  . Spinal stenosis     Past Surgical History:  Procedure Laterality Date  . EYE SURGERY Left   . HAND SURGERY Right    thumb  . INTRAVASCULAR PRESSURE WIRE/FFR STUDY N/A 11/02/2019   Procedure: INTRAVASCULAR PRESSURE WIRE/FFR STUDY;  Surgeon: Nelva Bush, MD;  Location: [facility];  Service: Cardiovascular;  Laterality: N/A;  . LUMBAR LAMINECTOMY/DECOMPRESSION MICRODISCECTOMY N/A 02/07/2014   Procedure: LUMBAR DECOMPRESSION Lumbar one-Lumbar five;  Surgeon: [name], MD;  Location: [facility];  Service: Orthopedics;  Laterality: N/A;  . RIGHT/LEFT HEART CATH AND CORONARY ANGIOGRAPHY N/A 11/02/2019   Procedure: RIGHT/LEFT HEART CATH AND CORONARY ANGIOGRAPHY;  Surgeon: Nelva Bush, MD;  Location: [place];  Service: Cardiovascular;  Laterality: N/A;  . SHOULDER ARTHROSCOPY WITH SUBACROMIAL  DECOMPRESSION Left 04/12/2014   Procedure: LEFT SHOULDER ARTHROSCOPY WITH SUBACROMIAL DECOMPRESSION AND LABRAL DEBRIDEMENT, ROTATOR CUFF DEBRIDEMENT, BICEPS DEBRIDEMENT;  Surgeon: [name], MD;  Location: [facility];  Service: Orthopedics;  Laterality: Left;    Current Medications: Outpatient Medications Prior to Visit  Medication Sig Dispense Refill  . albuterol (VENTOLIN HFA) 108 (90 Base) MCG/ACT inhaler Inhale 2 puffs into the lungs every 4 (four) hours as needed for wheezing or shortness of breath. 18 g 11  . amLODipine  (NORVASC) 5 MG tablet Take 1.5 tablets (7.5 mg total) by mouth daily. 135 tablet 3  . apixaban (ELIQUIS) 5 MG TABS tablet Take 1 tablet (5 mg total) by mouth 2 (two) times daily. 60 tablet 5  . aspirin EC 81 MG tablet Take 81 mg by mouth daily.    [name] buPROPion (WELLBUTRIN XL) 300 MG 24 hr tablet Take 300 mg by mouth daily.    . cyclobenzaprine (FLEXERIL) 10 MG tablet Take 10 mg by mouth 3 (three) times daily as needed for muscle spasms.    . furosemide (LASIX) 40 MG tablet Take 1 tablet (40 mg total) by mouth every other day. 15 tablet 1  . isosorbide mononitrate (IMDUR) 60 MG 24 hr tablet Take 1 tablet (60 mg total) by mouth daily. 90 tablet 3  . metoprolol succinate (TOPROL XL) 25 MG 24 hr tablet Take 1 tablet (25 mg total) by mouth daily. 30 tablet 9  . nitroGLYCERIN (NITROSTAT) 0.4 MG SL tablet Place 1 tablet (0.4 mg total) under the tongue every 5 (five) minutes as needed for chest pain. 25 tablet prn  . omeprazole (PRILOSEC) 20 MG capsule Take 20 mg by mouth daily.    . potassium chloride SA (KLOR-CON) 20 MEQ tablet Take 1 tablet (20 mEq total) by mouth every other day. 15 tablet 1  . rosuvastatin (CRESTOR) 10 MG tablet Take 10 mg by mouth daily.    . Tiotropium Bromide-Olodaterol (STIOLTO RESPIMAT) 2.5-2.5 MCG/ACT AERS Inhale 2 puffs into the lungs daily. 4 g 0  . valsartan (DIOVAN) 320 MG tablet Take 320 mg by mouth daily.     No facility-administered medications prior to visit.     Allergies:   Patient has no known allergies.   Social History   Socioeconomic History  . Marital status: Married    Spouse name: Not on file  . Number of children: Not on file  . Years of education: Not on file  . Highest education level: Not on file  Occupational History  . Occupation: Pipefitter  Tobacco Use  . Smoking status: Current Every Day Smoker    Packs/day: 2.50    Years: 54.00    Pack years: 135.00    Types: Cigarettes  . Smokeless tobacco: Never Used  . Tobacco comment: 10  cigarettes a day  Substance and Sexual Activity  . Alcohol use: No  . Drug use: No  . Sexual activity: Not on file  Other Topics Concern  . Not on file  Social History Narrative  . Not on file   Social Determinants of Health   Financial Resource Strain:   . Difficulty of Paying Living Expenses:   Food Insecurity:   . Worried About Charity fundraiser in the Last Year:   . Arboriculturist in the Last Year:   Transportation Needs:   . Film/video editor (Medical):   [name] Lack of Transportation (Non-Medical):   Physical Activity:   . Days of Exercise per Week:   .  Minutes of Exercise per Session:   Stress:   . Feeling of Stress :   Social Connections:   . Frequency of Communication with Friends and Family:   . Frequency of Social Gatherings with Friends and Family:   . Attends Religious Services:   . Active Member of Clubs or Organizations:   . Attends Archivist Meetings:   [name] Marital Status:      Family History: Family history is unknown by patient.   Review of Systems:   Please see the history of present illness.     General:  No chills, fever, night sweats or weight changes.  Cardiovascular:  No edema, orthopnea, palpitations, paroxysmal nocturnal dyspnea. Positive for dyspnea on exertion and chest discomfort.  Dermatological: No rash, lesions/masses Respiratory: No cough, dyspnea Urologic: No hematuria, dysuria Abdominal:   No nausea, vomiting, diarrhea, bright red blood per rectum, melena, or hematemesis Neurologic:  No visual changes, wkns, changes in mental status. All other systems reviewed and are otherwise negative except as noted above.   Physical Exam:    VS:  BP 130/80 (BP Location: Left Arm)   Pulse 74   Temp 97.8 F (36.6 C)   Ht 5\' 5"  (1.651 m)   Wt 251 lb (113.9 kg)   SpO2 95%   BMI 41.77 kg/m    General: Well developed, well nourished,male appearing in no acute distress. Head: Normocephalic, atraumatic, sclera non-icteric.  Neck:  No carotid bruits. JVD not elevated.  Lungs: Respirations regular and unlabored, without wheezes or rales.  Heart: Regular rate and rhythm. No S3 or S4.  No murmur, no rubs, or gallops appreciated. Abdomen: Soft, non-tender, non-distended. No obvious abdominal masses. Msk:  Strength and tone appear normal for age. No obvious joint deformities or effusions. Extremities: No clubbing or cyanosis. No lower extremity edema.  Distal pedal pulses are 2+ bilaterally. Neuro: Alert and oriented X 3. Moves all extremities spontaneously. No focal deficits noted. Psych:  Responds to questions appropriately with a normal affect. Skin: No rashes or lesions noted  Wt Readings from Last 3 Encounters:  01/12/20 251 lb (113.9 kg)  12/29/19 249 lb 9.6 oz (113.2 kg)  12/07/19 251 lb (113.9 kg)     Studies/Labs Reviewed:   EKG:  EKG is not ordered today.   Recent Labs: 10/30/2019: Platelets 152 11/02/2019: Hemoglobin 14.6 12/21/2019: BUN 18; Creatinine, Ser 1.01; NT-Pro BNP 255; Potassium 4.3; Sodium 139   Lipid Panel No results found for: CHOL, TRIG, HDL, CHOLHDL, VLDL, LDLCALC, LDLDIRECT  Additional studies/ records that were reviewed today include:   Echocardiogram: 10/2019 IMPRESSIONS    1. Left ventricular ejection fraction, by estimation, is 60 to 65%. The  left ventricle has normal function. The left ventricle has no regional  wall motion abnormalities. There is mild concentric left ventricular  hypertrophy. Left ventricular diastolic  parameters are indeterminate.  2. Right ventricular systolic function is normal. The right ventricular  size is normal.  3. The mitral valve is grossly normal. Trivial mitral valve  regurgitation.  4. The aortic valve is tricuspid. Aortic valve regurgitation is not  visualized. Mild aortic valve sclerosis is present, with no evidence of  aortic valve stenosis.  5. The inferior vena cava is normal in size with greater than 50%  respiratory variability,  suggesting right atrial pressure of 3 mmHg.   Cardiac Catheterization: 10/2019  Conclusions: 1. Two-vessel coronary artery disease with 60% proximal LAD stenosis that is heavily calcified and hemodynamically significant (DFR = 0.86), as well  as multifocal RCA disease with a long segment of 40% proximal stenosis and irregular distal lesion of up to 70-80% (not hemodynamically significant; DFR = 0.94).  There is also focal ostial stenosis involving the rPDA of approximately 70%. 2. Mildly reduced left ventricular systolic function with global hypokinesis (LVEF 45-50%). 3. Mildly elevated left and right heart filling pressures. 4. Mildly to moderately reduced Fick cardiac output/index.  Recommendations: 1. Optimize medical therapy for coronary artery disease and cardiomyopathy.  If Daniel Ball has refractory angina despite maximal tolerated doses of at least 2 antianginal agents, PCI to the LAD could be considered.  Due to the heavy calcification, atherectomy would need to be considered. 2. I will add metoprolol succinate 12.5 mg daily, to be escalated as tolerated. 3. Aggressive medical therapy and risk factor modification to prevent progression of CAD. Obtain echocardiogram for further assessment of cardiomyopathy and other structural heart disease.  Event Monitor: 12/2019 Sinus rhythm and atrial fibrillatoin   Rates:   44 to 166 bpm   Average HR 71 bpm    Assessment:    1. Coronary artery disease involving native coronary artery of native heart with angina pectoris ([place])   2. Dyspnea on exertion   3. Essential hypertension   4. Hyperlipidemia LDL goal <70   5. Tobacco use      Plan:   In order of problems listed above:  1. CAD/Dyspnea on Exertion - recent catheterization in 10/2019 showed 2-vessel CAD with 60% proximal LAD stenosis and multifocal RCA disease with a long segment of 40% proximal stenosis and irregular distal lesion of up to 70-80% which were not significant by FFR and  medical management was recommended and consideration of atherectomy of LAD if refractory symptoms.  - he has remained on Toprol-XL and Amlodipine with Imdur being initiated two weeks ago and now on 60mg  daily. Per his report, symptoms have not improved as he continues to have dyspnea on exertion with minimal activity. I did not further titrate Imdur today as he reports having dizziness and headaches with this. Will likely need to reduce back to 30mg  daily or discontinue in the future if side-effects do not improve.  - by review of his last office note, the plan was to arrange for PCI of the LAD if symptoms did not improve. Reviewed catheterization in detail and he wishes to proceed. The patient understands that risks include but are not limited to stroke (1 in 1000), death (1 in 72), kidney failure [usually temporary] (1 in 500), bleeding (1 in 200), allergic reaction [possibly serious] (1 in 200). Dr. Darnelle Bos prior notes had mentiond the patient would likely require coronary atherectomy. Will send a note to him and Dr. Harrington Challenger today to verify. I did have nursing staff book the procedure as such with the cath lab but this can be updated if needed. Will check pre-procedure labs and arrange for COVID testing.  - continue ASA, statin, Imdur, BB and Amlodipine at current dosing for now.   2. Paroxysmal Atrial Fibrillation - he denies any recent palpitations. Monitor reviewed which did show episodes of atrial fibrillation with a burden of 30% and average HR while in atrial fibrillation was 84 bpm. Average of 71 bpm for duration of the monitor. Continue Toprol-XL 25mg  daily for rate-control. Would not further titrate as HR was in the 40's at times by review of his monitor.  - he denies any evidence of active bleeding. Will recheck CBC with pre-procedure labs. Continue Eliquis 5mg  BID. Will need to  hold Eliquis 48 hours prior to his catheterization.   3. HTN - BP is well-controlled at 130/80 during today's visit.  Continue current regimen with Amlodipine 7.5mg  daily, Imdur 60mg  daily, and Toprol-XL 25mg  daily.   4. HLD - Overdue for fasting lipids and this needs to be obtained. Order for this is in the system. Remains on Crestor 10mg  daily and goal LDL is less than 70 with known CAD.   5. Tobacco Use - he was previously smoking 3 ppd, now down to 10 cigarettes per day. Congratulated on this with continued reduction advised.    Medication Adjustments/Labs and Tests Ordered: Current medicines are reviewed at length with the patient today.  Concerns regarding medicines are outlined above.  Medication changes, Labs and Tests ordered today are listed in the Patient Instructions below. Patient Instructions  Medication Instructions:  Your physician recommends that you continue on your current medications as directed. Please refer to the Current Medication list given to you today.  *If you need a refill on your cardiac medications before your next appointment, please call your pharmacy*   Lab Work: Your physician recommends that you return for lab work in: 01/16/20   If you have labs (blood work) drawn today and your tests are completely normal, you will receive your results only by: [name] MyChart Message (if you have MyChart) OR . A paper copy in the mail If you have any lab test that is abnormal or we need to change your treatment, we will call you to review the results.   Testing/Procedures: Your physician has requested that you have a cardiac catheterization. Cardiac catheterization is used to diagnose and/or treat various heart conditions. Doctors may recommend this procedure for a number of different reasons. The most common reason is to evaluate chest pain. Chest pain can be a symptom of coronary artery disease (CAD), and cardiac catheterization can show whether plaque is narrowing or blocking your heart's arteries. This procedure is also used to evaluate the valves, as well as measure the blood flow and  oxygen levels in different parts of your heart. For further information please visit HugeFiesta.[email or web address]. Please follow instruction sheet, as given.     Follow-Up: At [facility], you and your health needs are our priority.  As part of our continuing mission to provide you with exceptional heart care, we have created designated Provider Care Teams.  These Care Teams include your primary Cardiologist (physician) and Advanced Practice Providers (APPs -  Physician Assistants and Nurse Practitioners) who all work together to provide you with the care you need, when you need it.  We recommend signing up for the patient portal called "MyChart".  Sign up information is provided on this After Visit Summary.  MyChart is used to connect with patients for Virtual Visits (Telemedicine).  Patients are able to view lab/test results, encounter notes, upcoming appointments, etc.  Non-urgent messages can be sent to your provider as well.   To learn more about what you can do with MyChart, go to [email or web address].    Your next appointment:   2 week(s)  The format for your next appointment:   In Person  Provider:   You may see [name], MD or one of the following Advanced Practice Providers on your designated Care Team:    Bernerd Pho, PA-C   Ermalinda Barrios, PA-C     Other Instructions Thank you for choosing [facility]!     [address]  [place] [address] Dept: [phone] Loc: [place]  01/12/2020  You are scheduled for a Cardiac Catheterization on Thursday, April 29 with Dr. Harrell Gave End.  1. Please arrive at the [facility] (Main Entrance A) at [facility]: [place], [place] [address] at 5:30 AM (This time is two hours before your procedure to ensure your preparation). Free valet parking service is available.   Special note:  Every effort is made to have your procedure done on time. Please understand that emergencies sometimes delay scheduled procedures.  2. Diet: Do not eat solid foods after midnight.  The patient may have clear liquids until 5am upon the day of the procedure.  3. Labs: You will need to have blood drawn on Tuesday, April 27 at [facility]. [address], [place]  Open: 7am - 6pm, Sat 8am - 12 noon   Phone: [phone]. You do not need to be fasting.  4. Medication instructions in preparation for your procedure:   Contrast Allergy: No   Stop taking Eliquis (Apixiban) on Monday, April 26. Do not take Lasix the morning of your procedure    On the morning of your procedure, take your Aspirin and any morning medicines NOT listed above.  You may use sips of water.  5. Plan for one night stay--bring personal belongings. 6. Bring a current list of your medications and current insurance cards. 7. You MUST have a responsible person to drive you home. 8. Someone MUST be with you the first 24 hours after you arrive home or your discharge will be delayed. 9. Please wear clothes that are easy to get on and off and wear slip-on shoes.  Thank you for allowing [place] to care for you!   -- [facility]    Signed, Erma Heritage, Hershal Coria  01/13/2020 1:34 PM    [facility] [address] [place], [place] [address] Phone: [phone] Fax: [phone]

## 2020-01-12 NOTE — Patient Instructions (Signed)
Medication Instructions:  Your physician recommends that you continue on your current medications as directed. Please refer to the Current Medication list given to you today.  *If you need a refill on your cardiac medications before your next appointment, please call your pharmacy*   Lab Work: Your physician recommends that you return for lab work in: 01/16/20   If you have labs (blood work) drawn today and your tests are completely normal, you will receive your results only by: [name] MyChart Message (if you have MyChart) OR . A paper copy in the mail If you have any lab test that is abnormal or we need to change your treatment, we will call you to review the results.   Testing/Procedures: Your physician has requested that you have a cardiac catheterization. Cardiac catheterization is used to diagnose and/or treat various heart conditions. Doctors may recommend this procedure for a number of different reasons. The most common reason is to evaluate chest pain. Chest pain can be a symptom of coronary artery disease (CAD), and cardiac catheterization can show whether plaque is narrowing or blocking your heart's arteries. This procedure is also used to evaluate the valves, as well as measure the blood flow and oxygen levels in different parts of your heart. For further information please visit HugeFiesta.[email or web address]. Please follow instruction sheet, as given.     Follow-Up: At [facility], you and your health needs are our priority.  As part of our continuing mission to provide you with exceptional heart care, we have created designated Provider Care Teams.  These Care Teams include your primary Cardiologist (physician) and Advanced Practice Providers (APPs -  Physician Assistants and Nurse Practitioners) who all work together to provide you with the care you need, when you need it.  We recommend signing up for the patient portal called "MyChart".  Sign up information is provided on this After Visit  Summary.  MyChart is used to connect with patients for Virtual Visits (Telemedicine).  Patients are able to view lab/test results, encounter notes, upcoming appointments, etc.  Non-urgent messages can be sent to your provider as well.   To learn more about what you can do with MyChart, go to [email or web address].    Your next appointment:   2 week(s)  The format for your next appointment:   In Person  Provider:   You may see [name], MD or one of the following Advanced Practice Providers on your designated Care Team:    Bernerd Pho, PA-C   Ermalinda Barrios, PA-C     Other Instructions Thank you for choosing [facility]!     [address] [place] [address] Dept: [phone] Loc: [phone]  [id number] X [id number]  01/12/2020  You are scheduled for a Cardiac Catheterization on Thursday, April 29 with Dr. Harrell Gave End.  1. Please arrive at the [facility] (Main Entrance A) at [facility]: [place], [place] [address] at 5:30 AM (This time is two hours before your procedure to ensure your preparation). Free valet parking service is available.   Special note: Every effort is made to have your procedure done on time. Please understand that emergencies sometimes delay scheduled procedures.  2. Diet: Do not eat solid foods after midnight.  The patient may have clear liquids until 5am upon the day of the procedure.  3. Labs: You will need to have blood drawn on Tuesday, April 27 at [facility].  [address],   Open: 7am - 6pm, Sat 8am - 12 noon   Phone: [phone]. You do not need to be fasting.  4. Medication instructions in preparation for your procedure:   Contrast Allergy: No   Stop taking Eliquis (Apixiban) on Monday, April 26. Do not take Lasix the morning of your procedure    On the morning of your procedure, take your  Aspirin and any morning medicines NOT listed above.  You may use sips of water.  5. Plan for one night stay--bring personal belongings. 6. Bring a current list of your medications and current insurance cards. 7. You MUST have a responsible person to drive you home. 8. Someone MUST be with you the first 24 hours after you arrive home or your discharge will be delayed. 9. Please wear clothes that are easy to get on and off and wear slip-on shoes.  Thank you for allowing [place] to care for you!   -- [facility]

## 2020-01-13 ENCOUNTER — Encounter: Payer: Self-pay | Admitting: Student

## 2020-01-16 ENCOUNTER — Telehealth: Payer: Self-pay | Admitting: Student

## 2020-01-16 ENCOUNTER — Telehealth: Payer: Self-pay | Admitting: *Deleted

## 2020-01-16 ENCOUNTER — Other Ambulatory Visit: Payer: Self-pay

## 2020-01-16 ENCOUNTER — Other Ambulatory Visit (HOSPITAL_COMMUNITY)
Admission: RE | Admit: 2020-01-16 | Discharge: 2020-01-16 | Disposition: A | Discharge status: Home / Self Care | Payer: Medicare Other | Source: Ambulatory Visit | Attending: Internal Medicine | Admitting: Internal Medicine

## 2020-01-16 ENCOUNTER — Other Ambulatory Visit (HOSPITAL_COMMUNITY)
Admission: RE | Admit: 2020-01-16 | Discharge: 2020-01-16 | Disposition: A | Discharge status: Home / Self Care | Payer: Medicare Other | Source: Ambulatory Visit | Attending: Student | Admitting: Student

## 2020-01-16 DIAGNOSIS — Z01812 Encounter for preprocedural laboratory examination: Secondary | ICD-10-CM | POA: Insufficient documentation

## 2020-01-16 DIAGNOSIS — Z20822 Contact with and (suspected) exposure to covid-19: Secondary | ICD-10-CM | POA: Insufficient documentation

## 2020-01-16 LAB — CBC WITH DIFFERENTIAL/PLATELET
Abs Immature Granulocytes: 0.04 10*3/uL (ref 0.00–0.07)
Basophils Absolute: 0 10*3/uL (ref 0.0–0.1)
Basophils Relative: 1 %
Eosinophils Absolute: 0.2 10*3/uL (ref 0.0–0.5)
Eosinophils Relative: 3 %
HCT: 43 % (ref 39.0–52.0)
Hemoglobin: 14.2 g/dL (ref 13.0–17.0)
Immature Granulocytes: 1 %
Lymphocytes Relative: 27 %
Lymphs Abs: 2 10*3/uL (ref 0.7–4.0)
MCH: 31.7 pg (ref 26.0–34.0)
MCHC: 33 g/dL (ref 30.0–36.0)
MCV: 96 fL (ref 80.0–100.0)
Monocytes Absolute: 0.6 10*3/uL (ref 0.1–1.0)
Monocytes Relative: 8 %
Neutro Abs: 4.6 10*3/uL (ref 1.7–7.7)
Neutrophils Relative %: 60 %
Platelets: 141 10*3/uL — ABNORMAL LOW (ref 150–400)
RBC: 4.48 MIL/uL (ref 4.22–5.81)
RDW: 13 % (ref 11.5–15.5)
WBC: 7.5 10*3/uL (ref 4.0–10.5)
nRBC: 0 % (ref 0.0–0.2)

## 2020-01-16 LAB — BASIC METABOLIC PANEL
Anion gap: 8 (ref 5–15)
BUN: 16 mg/dL (ref 8–23)
CO2: 28 mmol/L (ref 22–32)
Calcium: 9.1 mg/dL (ref 8.9–10.3)
Chloride: 101 mmol/L (ref 98–111)
Creatinine, Ser: 0.96 mg/dL (ref 0.61–1.24)
GFR calc Af Amer: >60 mL/min (ref >60)
GFR calc non Af Amer: >60 mL/min (ref >60)
Glucose, Bld: 142 mg/dL — ABNORMAL HIGH (ref 70–99)
Potassium: 3.9 mmol/L (ref 3.5–5.1)
Sodium: 137 mmol/L (ref 135–145)

## 2020-01-16 NOTE — Telephone Encounter (Addendum)
Pt contacted pre-coronary atherectomy scheduled at [facility] for: Thursday January 18, 2020 7:30AM Verified arrival time and place: [facility]) at: 5:30 AM   No solid food after midnight prior to cath, clear liquids until 5 AM day of procedure.  Hold: Eliquis-none 01/16/20 until post procedure. Lasix/KCl-AM of procedure  Except hold medications AM meds can be  taken pre-cath with sip of water including: ASA 81 mg   Confirmed patient has responsible adult to drive home post procedure and observe 24 hours after arriving home: yes  You are allowed ONE visitor in the waiting room during your procedures. Both you and your visitor must wear masks.      COVID-19 Pre-Screening Questions:  . In the past 7 to 10 days have you had a cough,  shortness of breath, headache, congestion, fever (100 or greater) body aches, chills, sore throat, or sudden loss of taste or sense of smell? no . Have you been around anyone with known Covid 19 in the past 7 to 10 days? no . Have you been around anyone who is awaiting Covid 19 test results in the past 7 to 10 days? no . Have you been around anyone who  has mentioned symptoms of Covid 19 within the past 7 to 10 days? No  Reviewed procedure/mask/visitor instructions, COVID-19 screening questions with patient.   Copied from staff message 01/16/20: Per Dr End:              Let's just load him with 600 of Plavix when he comes in on Thursday AM to short stay.             Order has been placed for Plavix 600 mg pre procedure 01/18/20.

## 2020-01-16 NOTE — Telephone Encounter (Signed)
      I went in pt chart 2 times to see who called him today(01-16-20)

## 2020-01-17 LAB — SARS CORONAVIRUS 2 (TAT 6-24 HRS): SARS Coronavirus 2: NEGATIVE

## 2020-01-18 ENCOUNTER — Other Ambulatory Visit: Payer: Self-pay

## 2020-01-18 ENCOUNTER — Ambulatory Visit (HOSPITAL_COMMUNITY)
Admission: RE | Admit: 2020-01-18 | Discharge: 2020-01-19 | Disposition: A | Discharge status: Home / Self Care | Payer: Medicare Other | Attending: Internal Medicine | Admitting: Internal Medicine

## 2020-01-18 ENCOUNTER — Encounter (HOSPITAL_COMMUNITY)
Admission: RE | Disposition: A | Discharge status: Home / Self Care | Payer: Self-pay | Source: Home / Self Care | Attending: Internal Medicine

## 2020-01-18 ENCOUNTER — Encounter (HOSPITAL_COMMUNITY): Payer: Self-pay | Admitting: Internal Medicine

## 2020-01-18 DIAGNOSIS — E785 Hyperlipidemia, unspecified: Secondary | ICD-10-CM | POA: Diagnosis not present

## 2020-01-18 DIAGNOSIS — I10 Essential (primary) hypertension: Secondary | ICD-10-CM | POA: Insufficient documentation

## 2020-01-18 DIAGNOSIS — I25118 Atherosclerotic heart disease of native coronary artery with other forms of angina pectoris: Secondary | ICD-10-CM | POA: Diagnosis not present

## 2020-01-18 DIAGNOSIS — Z7901 Long term (current) use of anticoagulants: Secondary | ICD-10-CM | POA: Insufficient documentation

## 2020-01-18 DIAGNOSIS — K219 Gastro-esophageal reflux disease without esophagitis: Secondary | ICD-10-CM | POA: Insufficient documentation

## 2020-01-18 DIAGNOSIS — Z955 Presence of coronary angioplasty implant and graft: Secondary | ICD-10-CM

## 2020-01-18 DIAGNOSIS — Z7982 Long term (current) use of aspirin: Secondary | ICD-10-CM | POA: Insufficient documentation

## 2020-01-18 DIAGNOSIS — G4730 Sleep apnea, unspecified: Secondary | ICD-10-CM | POA: Insufficient documentation

## 2020-01-18 DIAGNOSIS — F17210 Nicotine dependence, cigarettes, uncomplicated: Secondary | ICD-10-CM | POA: Diagnosis not present

## 2020-01-18 DIAGNOSIS — J449 Chronic obstructive pulmonary disease, unspecified: Secondary | ICD-10-CM | POA: Insufficient documentation

## 2020-01-18 DIAGNOSIS — I200 Unstable angina: Secondary | ICD-10-CM | POA: Diagnosis present

## 2020-01-18 DIAGNOSIS — Z79899 Other long term (current) drug therapy: Secondary | ICD-10-CM | POA: Insufficient documentation

## 2020-01-18 DIAGNOSIS — I480 Paroxysmal atrial fibrillation: Secondary | ICD-10-CM | POA: Diagnosis not present

## 2020-01-18 HISTORY — PX: CORONARY ATHERECTOMY: CATH118238

## 2020-01-18 HISTORY — PX: CORONARY STENT INTERVENTION: CATH118234

## 2020-01-18 HISTORY — DX: Prediabetes: R73.03

## 2020-01-18 HISTORY — PX: INTRAVASCULAR ULTRASOUND/IVUS: CATH118244

## 2020-01-18 LAB — POCT ACTIVATED CLOTTING TIME
Activated Clotting Time: 268 seconds
Activated Clotting Time: 268 seconds
Activated Clotting Time: 252 seconds
Activated Clotting Time: 252 seconds
Activated Clotting Time: 268 seconds

## 2020-01-18 SURGERY — CORONARY ATHERECTOMY
Anesthesia: LOCAL

## 2020-01-18 MED ORDER — ASPIRIN EC 81 MG PO TBEC
81.0000 mg | DELAYED_RELEASE_TABLET | Freq: Every day | ORAL | Status: DC
  Administered 2020-01-19: 81 mg via ORAL
  Filled 2020-01-18: qty 1

## 2020-01-18 MED ORDER — CYCLOBENZAPRINE HCL 10 MG PO TABS: 10.0000 mg | ORAL_TABLET | Freq: Three times a day (TID) | ORAL | Status: DC | PRN

## 2020-01-18 MED ORDER — LIDOCAINE HCL (PF) 1 % IJ SOLN
INTRAMUSCULAR | Status: DC | PRN
  Administered 2020-01-18: 2 mL

## 2020-01-18 MED ORDER — SODIUM CHLORIDE 0.9 % WEIGHT BASED INFUSION: 1.0000 mL/kg/h | INTRAVENOUS | Status: DC

## 2020-01-18 MED ORDER — SODIUM CHLORIDE 0.9 % IV SOLN: INTRAVENOUS | Status: AC

## 2020-01-18 MED ORDER — BUPROPION HCL ER (XL) 150 MG PO TB24
300.0000 mg | ORAL_TABLET | Freq: Every day | ORAL | Status: DC
  Administered 2020-01-19: 300 mg via ORAL
  Filled 2020-01-18: qty 2

## 2020-01-18 MED ORDER — HEPARIN SODIUM (PORCINE) 1000 UNIT/ML IJ SOLN
INTRAMUSCULAR | Status: AC
  Filled 2020-01-18: qty 1

## 2020-01-18 MED ORDER — VIPERSLIDE LUBRICANT OPTIME: TOPICAL | Status: DC | PRN

## 2020-01-18 MED ORDER — NITROGLYCERIN 1 MG/10 ML FOR IR/CATH LAB
INTRA_ARTERIAL | Status: AC
  Filled 2020-01-18: qty 10

## 2020-01-18 MED ORDER — SODIUM CHLORIDE 0.9% FLUSH: 3.0000 mL | INTRAVENOUS | Status: DC | PRN

## 2020-01-18 MED ORDER — CLOPIDOGREL BISULFATE 75 MG PO TABS
75.0000 mg | ORAL_TABLET | Freq: Every day | ORAL | Status: DC
  Administered 2020-01-19: 75 mg via ORAL
  Filled 2020-01-18: qty 1

## 2020-01-18 MED ORDER — NITROGLYCERIN 0.4 MG SL SUBL: 0.4000 mg | SUBLINGUAL_TABLET | SUBLINGUAL | Status: DC | PRN

## 2020-01-18 MED ORDER — IRBESARTAN 150 MG PO TABS
300.0000 mg | ORAL_TABLET | Freq: Every day | ORAL | Status: DC
  Administered 2020-01-19: 300 mg via ORAL
  Filled 2020-01-18: qty 2

## 2020-01-18 MED ORDER — SODIUM CHLORIDE 0.9 % IV SOLN: 250.0000 mL | INTRAVENOUS | Status: DC | PRN

## 2020-01-18 MED ORDER — AMLODIPINE BESYLATE 5 MG PO TABS
7.5000 mg | ORAL_TABLET | Freq: Every day | ORAL | Status: DC
  Administered 2020-01-19: 7.5 mg via ORAL
  Filled 2020-01-18: qty 1

## 2020-01-18 MED ORDER — LIDOCAINE HCL (PF) 1 % IJ SOLN
INTRAMUSCULAR | Status: AC
  Filled 2020-01-18: qty 30

## 2020-01-18 MED ORDER — METOPROLOL SUCCINATE ER 25 MG PO TB24
25.0000 mg | ORAL_TABLET | Freq: Every day | ORAL | Status: DC
  Administered 2020-01-19: 25 mg via ORAL
  Filled 2020-01-18: qty 1

## 2020-01-18 MED ORDER — CLOPIDOGREL BISULFATE 300 MG PO TABS
600.0000 mg | ORAL_TABLET | Freq: Once | ORAL | Status: AC
  Administered 2020-01-18: 600 mg via ORAL
  Filled 2020-01-18: qty 2

## 2020-01-18 MED ORDER — HEPARIN (PORCINE) IN NACL 1000-0.9 UT/500ML-% IV SOLN
INTRAVENOUS | Status: AC
  Filled 2020-01-18: qty 500

## 2020-01-18 MED ORDER — ACETAMINOPHEN 325 MG PO TABS: 650.0000 mg | ORAL_TABLET | ORAL | Status: DC | PRN

## 2020-01-18 MED ORDER — LABETALOL HCL 5 MG/ML IV SOLN: 10.0000 mg | INTRAVENOUS | Status: AC | PRN

## 2020-01-18 MED ORDER — HEPARIN SODIUM (PORCINE) 1000 UNIT/ML IJ SOLN
INTRAMUSCULAR | Status: DC | PRN
  Administered 2020-01-18 (×2): 2000 [IU] via INTRAVENOUS
  Administered 2020-01-18: 3000 [IU] via INTRAVENOUS
  Administered 2020-01-18: 10000 [IU] via INTRAVENOUS

## 2020-01-18 MED ORDER — VERAPAMIL HCL 2.5 MG/ML IV SOLN
INTRAVENOUS | Status: DC | PRN
  Administered 2020-01-18: 10 mL via INTRA_ARTERIAL

## 2020-01-18 MED ORDER — FENTANYL CITRATE (PF) 100 MCG/2ML IJ SOLN
INTRAMUSCULAR | Status: DC | PRN
  Administered 2020-01-18: 25 ug via INTRAVENOUS
  Administered 2020-01-18: 50 ug via INTRAVENOUS

## 2020-01-18 MED ORDER — IOHEXOL 350 MG/ML SOLN
INTRAVENOUS | Status: AC
  Filled 2020-01-18: qty 1

## 2020-01-18 MED ORDER — MIDAZOLAM HCL 2 MG/2ML IJ SOLN
INTRAMUSCULAR | Status: AC
  Filled 2020-01-18: qty 2

## 2020-01-18 MED ORDER — HEPARIN (PORCINE) IN NACL 1000-0.9 UT/500ML-% IV SOLN
INTRAVENOUS | Status: DC | PRN
  Administered 2020-01-18 (×3): 500 mL

## 2020-01-18 MED ORDER — IOHEXOL 350 MG/ML SOLN
INTRAVENOUS | Status: DC | PRN
  Administered 2020-01-18: 135 mL

## 2020-01-18 MED ORDER — MIDAZOLAM HCL 2 MG/2ML IJ SOLN
INTRAMUSCULAR | Status: DC | PRN
  Administered 2020-01-18 (×2): 1 mg via INTRAVENOUS

## 2020-01-18 MED ORDER — VERAPAMIL HCL 2.5 MG/ML IV SOLN
INTRAVENOUS | Status: AC
  Filled 2020-01-18: qty 2

## 2020-01-18 MED ORDER — ASPIRIN 81 MG PO CHEW: 81.0000 mg | CHEWABLE_TABLET | ORAL | Status: DC

## 2020-01-18 MED ORDER — SODIUM CHLORIDE 0.9 % WEIGHT BASED INFUSION
3.0000 mL/kg/h | INTRAVENOUS | Status: DC
  Administered 2020-01-18: 3 mL/kg/h via INTRAVENOUS

## 2020-01-18 MED ORDER — SODIUM CHLORIDE 0.9% FLUSH
3.0000 mL | Freq: Two times a day (BID) | INTRAVENOUS | Status: DC
  Administered 2020-01-18 – 2020-01-19 (×2): 3 mL via INTRAVENOUS

## 2020-01-18 MED ORDER — NITROGLYCERIN 1 MG/10 ML FOR IR/CATH LAB
INTRA_ARTERIAL | Status: DC | PRN
  Administered 2020-01-18: 100 ug via INTRACORONARY
  Administered 2020-01-18: 200 ug via INTRACORONARY

## 2020-01-18 MED ORDER — ALBUTEROL SULFATE (2.5 MG/3ML) 0.083% IN NEBU: 2.5000 mg | INHALATION_SOLUTION | RESPIRATORY_TRACT | Status: DC | PRN

## 2020-01-18 MED ORDER — HEPARIN (PORCINE) IN NACL 1000-0.9 UT/500ML-% IV SOLN
INTRAVENOUS | Status: AC
  Filled 2020-01-18: qty 1000

## 2020-01-18 MED ORDER — HYDRALAZINE HCL 20 MG/ML IJ SOLN: 10.0000 mg | INTRAMUSCULAR | Status: AC | PRN

## 2020-01-18 MED ORDER — ISOSORBIDE MONONITRATE ER 30 MG PO TB24
30.0000 mg | ORAL_TABLET | Freq: Every day | ORAL | Status: DC
  Administered 2020-01-19: 30 mg via ORAL
  Filled 2020-01-18: qty 1

## 2020-01-18 MED ORDER — ROSUVASTATIN CALCIUM 5 MG PO TABS
10.0000 mg | ORAL_TABLET | Freq: Every day | ORAL | Status: DC
  Administered 2020-01-19: 10 mg via ORAL
  Filled 2020-01-18: qty 2

## 2020-01-18 MED ORDER — PANTOPRAZOLE SODIUM 40 MG PO TBEC
40.0000 mg | DELAYED_RELEASE_TABLET | Freq: Every day | ORAL | Status: DC
  Administered 2020-01-19: 40 mg via ORAL
  Filled 2020-01-18: qty 1

## 2020-01-18 MED ORDER — ONDANSETRON HCL 4 MG/2ML IJ SOLN: 4.0000 mg | Freq: Four times a day (QID) | INTRAMUSCULAR | Status: DC | PRN

## 2020-01-18 MED ORDER — SODIUM CHLORIDE 0.9% FLUSH
3.0000 mL | Freq: Two times a day (BID) | INTRAVENOUS | Status: DC
  Administered 2020-01-19: 3 mL via INTRAVENOUS

## 2020-01-18 MED ORDER — FENTANYL CITRATE (PF) 100 MCG/2ML IJ SOLN
INTRAMUSCULAR | Status: AC
  Filled 2020-01-18: qty 2

## 2020-01-18 SURGICAL SUPPLY — 26 items
BALLN MINITREK OTW 1.5X6 (BALLOONS) ×2
BALLN SAPPHIRE 3.0X15 (BALLOONS) ×2
BALLN SAPPHIRE ~~LOC~~ 4.0X15 (BALLOONS) ×1 IMPLANT
BALLOON MINITREK OTW 1.5X6 (BALLOONS) IMPLANT
BALLOON SAPPHIRE 3.0X15 (BALLOONS) IMPLANT
CATH INFINITI JR4 5F (CATHETERS) ×1 IMPLANT
CATH OPTICROSS HD (CATHETERS) ×1 IMPLANT
CATH VISTA GUIDE 6FR XBLAD3.5 (CATHETERS) ×1 IMPLANT
CROWN DIAMONDBACK CLASSIC 1.25 (BURR) ×1 IMPLANT
DEVICE RAD COMP TR BAND LRG (VASCULAR PRODUCTS) ×1 IMPLANT
GLIDESHEATH SLEND A-KIT 6F 22G (SHEATH) ×1 IMPLANT
GUIDEWIRE INQWIRE 1.5J.035X260 (WIRE) IMPLANT
INQWIRE 1.5J .035X260CM (WIRE) ×2
KIT ENCORE 26 ADVANTAGE (KITS) ×1 IMPLANT
KIT HEART LEFT (KITS) ×2 IMPLANT
KIT HEMO VALVE WATCHDOG (MISCELLANEOUS) ×1 IMPLANT
LUBRICANT VIPERSLIDE CORONARY (MISCELLANEOUS) ×1 IMPLANT
PACK CARDIAC CATHETERIZATION (CUSTOM PROCEDURE TRAY) ×2 IMPLANT
SHEATH PROBE COVER 6X72 (BAG) ×1 IMPLANT
SLED PULL BACK IVUS (MISCELLANEOUS) ×1 IMPLANT
STENT SYNERGY XD 3.50X28 (Permanent Stent) IMPLANT
SYNERGY XD 3.50X28 (Permanent Stent) ×2 IMPLANT
TRANSDUCER W/STOPCOCK (MISCELLANEOUS) ×2 IMPLANT
TUBING CIL FLEX 10 FLL-RA (TUBING) ×2 IMPLANT
WIRE RUNTHROUGH .014X180CM (WIRE) ×1 IMPLANT
WIRE VIPERWIRE COR FLEX .012 (WIRE) ×1 IMPLANT

## 2020-01-18 NOTE — Interval H&P Note (Signed)
History and Physical Interval Note:  01/18/2020 6:48 AM  Daniel Ball  has presented today for surgery, with the diagnosis of coronary artery disease with stable angina.  The various methods of treatment have been discussed with the patient and family. After consideration of risks, benefits and other options for treatment, the patient has consented to  Procedure(s): CORONARY ATHERECTOMY (N/A) as a surgical intervention.  The patient's history has been reviewed, patient examined, no change in status, stable for surgery.  I have reviewed the patient's chart and labs.  Questions were answered to the patient's satisfaction.    Cath Lab Visit (complete for each Cath Lab visit)  Clinical Evaluation Leading to the Procedure:   ACS: No.  Non-ACS:    Anginal Classification: CCS III  Anti-ischemic medical therapy: Maximal Therapy (2 or more classes of medications)  Non-Invasive Test Results: No non-invasive testing performed  Prior CABG: No previous CABG  [name]

## 2020-01-19 DIAGNOSIS — G4730 Sleep apnea, unspecified: Secondary | ICD-10-CM | POA: Diagnosis not present

## 2020-01-19 DIAGNOSIS — I200 Unstable angina: Secondary | ICD-10-CM

## 2020-01-19 DIAGNOSIS — E785 Hyperlipidemia, unspecified: Secondary | ICD-10-CM | POA: Diagnosis not present

## 2020-01-19 DIAGNOSIS — Z79899 Other long term (current) drug therapy: Secondary | ICD-10-CM | POA: Diagnosis not present

## 2020-01-19 DIAGNOSIS — J449 Chronic obstructive pulmonary disease, unspecified: Secondary | ICD-10-CM | POA: Diagnosis not present

## 2020-01-19 DIAGNOSIS — I10 Essential (primary) hypertension: Secondary | ICD-10-CM | POA: Diagnosis not present

## 2020-01-19 DIAGNOSIS — I25118 Atherosclerotic heart disease of native coronary artery with other forms of angina pectoris: Secondary | ICD-10-CM | POA: Diagnosis not present

## 2020-01-19 DIAGNOSIS — I480 Paroxysmal atrial fibrillation: Secondary | ICD-10-CM | POA: Diagnosis not present

## 2020-01-19 DIAGNOSIS — Z7982 Long term (current) use of aspirin: Secondary | ICD-10-CM | POA: Diagnosis not present

## 2020-01-19 DIAGNOSIS — K219 Gastro-esophageal reflux disease without esophagitis: Secondary | ICD-10-CM | POA: Diagnosis not present

## 2020-01-19 DIAGNOSIS — Z7901 Long term (current) use of anticoagulants: Secondary | ICD-10-CM | POA: Diagnosis not present

## 2020-01-19 DIAGNOSIS — F17210 Nicotine dependence, cigarettes, uncomplicated: Secondary | ICD-10-CM | POA: Diagnosis not present

## 2020-01-19 LAB — BASIC METABOLIC PANEL
Anion gap: 11 (ref 5–15)
BUN: 13 mg/dL (ref 8–23)
CO2: 28 mmol/L (ref 22–32)
Calcium: 9.3 mg/dL (ref 8.9–10.3)
Chloride: 101 mmol/L (ref 98–111)
Creatinine, Ser: 1.2 mg/dL (ref 0.61–1.24)
GFR calc Af Amer: >60 mL/min (ref >60)
GFR calc non Af Amer: >60 mL/min (ref >60)
Glucose, Bld: 128 mg/dL — ABNORMAL HIGH (ref 70–99)
Potassium: 4.1 mmol/L (ref 3.5–5.1)
Sodium: 140 mmol/L (ref 135–145)

## 2020-01-19 LAB — LIPID PANEL
Cholesterol: 142 mg/dL (ref 0–200)
HDL: 36 mg/dL — ABNORMAL LOW (ref >40)
LDL Cholesterol: 78 mg/dL (ref 0–99)
Total CHOL/HDL Ratio: 3.9 RATIO
Triglycerides: 139 mg/dL (ref <150)
VLDL: 28 mg/dL (ref 0–40)

## 2020-01-19 LAB — CBC
HCT: 43.5 % (ref 39.0–52.0)
Hemoglobin: 14.4 g/dL (ref 13.0–17.0)
MCH: 31.2 pg (ref 26.0–34.0)
MCHC: 33.1 g/dL (ref 30.0–36.0)
MCV: 94.4 fL (ref 80.0–100.0)
Platelets: 131 10*3/uL — ABNORMAL LOW (ref 150–400)
RBC: 4.61 MIL/uL (ref 4.22–5.81)
RDW: 13.1 % (ref 11.5–15.5)
WBC: 9.1 10*3/uL (ref 4.0–10.5)
nRBC: 0 % (ref 0.0–0.2)

## 2020-01-19 MED ORDER — ISOSORBIDE MONONITRATE ER 30 MG PO TB24: 30.0000 mg | ORAL_TABLET | Freq: Every day | ORAL | 1 refills | Status: DC

## 2020-01-19 MED ORDER — ROSUVASTATIN CALCIUM 40 MG PO TABS
40.0000 mg | ORAL_TABLET | Freq: Every day | ORAL | 2 refills | Status: DC
Start: 2020-01-19 — End: 2020-02-13

## 2020-01-19 MED ORDER — PANTOPRAZOLE SODIUM 40 MG PO TBEC: 40.0000 mg | DELAYED_RELEASE_TABLET | Freq: Every day | ORAL | 2 refills | Status: DC

## 2020-01-19 MED ORDER — CLOPIDOGREL BISULFATE 75 MG PO TABS: 75.0000 mg | ORAL_TABLET | Freq: Every day | ORAL | 2 refills | Status: DC

## 2020-01-19 MED FILL — ISOSORBIDE MN ER 30 MG TAB: 30 | 30 days supply | Qty: 30 | Fill #0

## 2020-01-19 MED FILL — PANTOPRAZOLE SOD DR 40 MG T: 40 | 60 days supply | Qty: 60 | Fill #0

## 2020-01-19 MED FILL — CLOPIDOGREL 75 MG TABLET: 75 | 30 days supply | Qty: 30 | Fill #0

## 2020-01-19 MED FILL — ROSUVASTATIN CALCIUM 40 MG: 40 | 30 days supply | Qty: 30 | Fill #0

## 2020-01-19 NOTE — Discharge Summary (Signed)
Discharge Summary    Patient ID: JAIEL SARACENO MRN: [id number]; DOB: 09-24-1955  Admit date: 01/18/2020 Discharge date: 01/19/2020  Primary Care Provider: [name], MD  Primary Cardiologist: [name], MD   Discharge Diagnoses    Principal Problem:   Accelerating angina [facility]) Active Problems:   Coronary artery disease of native artery of native heart with stable angina pectoris [facility])  Diagnostic Studies/Procedures    [facility] with stent intervention and arthrectomy 01/19/20:  Conclusions: 1. Multivessel coronary artery disease, including 80% proximal LAD stenosis previously shown to be hemodynamically significant by DFR.  There is also moderate disease involving the distal LMCA/ostial LAD, mid/distal LAD, and OM1/OM2. 2. Mildly elevated left ventricular filling pressure. 3. Successful orbital atherectomy and IVUS guided PCI to the proximal/mid LAD using a Synergy 3.5 x 28 mm drug-eluting stent (postdilated to 4.1 mm) with 0% residual stenosis and TIMI-3 flow.  Recommendations: 1. Overnight extended recovery. 2. If no evidence of bleeding/vascular complications, anticipate restarting apixaban tomorrow.  Would continue apixaban and clopidogrel for at least 6 months, after which time patient could be transitioned to apixaban and aspirin 81 mg daily. 3. Aggressive secondary prevention.  Fasting lipid panel to be checked with morning labs to see if rosuvastatin needs to be increased.  History of Present Illness     Daniel Ball is a 64 y.o. male with a past medical history of CAD (s/p cath in 10/2019 showing 2-vessel CADwith 60% proximal LAD stenosisandmultifocal RCA disease with a long segment of 40% proximal stenosis and irregular distal lesion of up to 70-80%which were not significant by FFR and medical management was recommended and consideration of atherectomyof LAD if refractory symptoms,paroxysmal atrial fibrillation, HTN,HLDand tobacco usewho was sent to [facility] for  LHC.   He was previously seen by Cecilie Kicks, NP on 12/29/2019 and reported still having dyspnea on exertion but no specific chest discomfort. He had reduced his tobacco use from 3 packs/day down to 10 cigarettes/day. He had been evaluated by Pulmonology and symptoms were not felt to be secondary to COPD. Options were reviewed with Dr. Harrington Challenger along with Dr. Saunders Revel and the plan was to add Imdur 30 mg daily and titrate to 60 mg daily and follow-up in 2 weeks. If no improvement, would then plan for PCI of the LAD. He did have an event monitor which resulted in the interim and showed intermittent episodes of sinus rhythm and atrial fibrillation with variable rates and an average heart rate of 71 bpm.  In follow up 01/12/20, he continued to have dyspnea on exertion with minimal activities such as household chores or walking to the mailbox. He reported being short of breath for years but his symptoms had acutely worsened within the past few months. No specific chest pain but did get a discomfort along his chest at times when walking. No reported orthopnea, PND or lower extremity edema.   He titrated his Imdur to 60mg  daily and did not notice improvement in his symptoms. Plan was for [facility].   Hospital Course   He presented for [facility] 01/18/20 which showed multivessel coronary artery disease, including 80% proximal LAD stenosis previously shown to be hemodynamically significant by DFR.  There was also moderate disease involving the distal LMCA/ostial LAD, mid/distal LAD, and OM1/OM2. A successful orbital atherectomy and IVUS guided PCI to the proximal/mid LAD was performed.   Plan was for overnight observation with continuation of apixaban and Plavix for at least 6 months duration after which time can transition to  apixaban and ASA 81 daily. Continue with aggressive secondary prevention. LDL on AM lipid panel at 78.   He ambulated with cardiac rehabilitation without difficulty. Cath site stable.   Consultants: None    General: Morbidly obese gentleman, no acute distress, sitting up in the chair. Lungs:Clear to ausculation bilaterally. No wheezes, rales, or rhonchi. Breathing is unlabored. Cardiovascular: RRR with S1 S2. No murmurs Abdomen: Soft, non-tender, non-distended. No obvious abdominal masses. Extremities: No edema.  Right radial cath site appears to be well-healed.  Bandage still in place. Neuro: Alert and oriented. No focal deficits. No facial asymmetry. MAE spontaneously. Psych: Responds to questions appropriately with normal affect.     The patient was seen and examined by Dr. Acie Fredrickson who feels that he is stable and ready for discharge today, 01/19/20.   Did the patient have an acute coronary syndrome (MI, NSTEMI, STEMI, etc) this admission?:  No                               Did the patient have a percutaneous coronary intervention (stent / angioplasty)?:  Yes.     Cath/PCI Registry Performance & Quality Measures: 4. Aspirin prescribed? - No - on Eliquis and Plavix and will transition to ASA after 6 months  5. ADP Receptor Inhibitor (Plavix/Clopidogrel, Brilinta/Ticagrelor or Effient/Prasugrel) prescribed (includes medically managed patients)? - Yes 6. High Intensity Statin (Lipitor 40-80mg  or Crestor 20-40mg ) prescribed? - Yes 7. For EF <40%, was ACEI/ARB prescribed? - Yes 8. For EF <40%, Aldosterone Antagonist (Spironolactone or Eplerenone) prescribed? - Not Applicable (EF >/= 62%) 9. Cardiac Rehab Phase II ordered (Included Medically managed Patients)? - Yes   _____________  Discharge Vitals Blood pressure (!) 146/75, pulse 69, temperature 98.1 F (36.7 C), temperature source Oral, resp. rate 19, height 5\' 5"  (1.651 m), weight 115.1 kg, SpO2 95 %.  Filed Weights   01/18/20 0548 01/18/20 1200 01/19/20 0341  Weight: 112.5 kg 115.4 kg 115.1 kg    Labs & Radiologic Studies    CBC Recent Labs    01/16/20 1000 01/19/20 0357  WBC 7.5 9.1  NEUTROABS 4.6  --   HGB 14.2 14.4  HCT  43.0 43.5  MCV 96.0 94.4  PLT 141* 694*   Basic Metabolic Panel Recent Labs    01/16/20 1000 01/19/20 0357  NA 137 140  K 3.9 4.1  CL 101 101  CO2 28 28  GLUCOSE 142* 128*  BUN 16 13  CREATININE 0.96 1.20  CALCIUM 9.1 9.3   Liver Function Tests No results for input(s): AST, ALT, ALKPHOS, BILITOT, PROT, ALBUMIN in the last 72 hours. No results for input(s): LIPASE, AMYLASE in the last 72 hours. High Sensitivity Troponin:   No results for input(s): TROPONINIHS in the last 720 hours.  BNP Invalid input(s): POCBNP D-Dimer No results for input(s): DDIMER in the last 72 hours. Hemoglobin A1C No results for input(s): HGBA1C in the last 72 hours. Fasting Lipid Panel Recent Labs    01/19/20 0357  CHOL 142  HDL 36*  LDLCALC 78  TRIG 139  CHOLHDL 3.9   Thyroid Function Tests No results for input(s): TSH, T4TOTAL, T3FREE, THYROIDAB in the last 72 hours.  Invalid input(s): FREET3 _____________  CARDIAC CATHETERIZATION  Result Date: 01/18/2020 Conclusions: 1. Multivessel coronary artery disease, including 80% proximal LAD stenosis previously shown to be hemodynamically significant by DFR.  There is also moderate disease involving the distal LMCA/ostial LAD, mid/distal LAD, and OM1/OM2.  2. Mildly elevated left ventricular filling pressure. 3. Successful orbital atherectomy and IVUS guided PCI to the proximal/mid LAD using a Synergy 3.5 x 28 mm drug-eluting stent (postdilated to 4.1 mm) with 0% residual stenosis and TIMI-3 flow. Recommendations: 1. Overnight extended recovery. 2. If no evidence of bleeding/vascular complications, anticipate restarting apixaban tomorrow.  Would continue apixaban and clopidogrel for at least 6 months, after which time patient could be transitioned to apixaban and aspirin 81 mg daily. 3. Aggressive secondary prevention.  Fasting lipid panel to be checked with morning labs to see if rosuvastatin needs to be increased. Nelva Bush, MD [facility]    Disposition   Pt is being discharged home today in good condition.  Follow-up Plans & Appointments   Follow-up Information    Erma Heritage, PA-C Follow up on 02/02/2020.   Specialties: Librarian, academic, Cardiology Why: at 1pm.  Contact information: [place] [address] [phone]          Discharge Instructions    Amb Referral to Cardiac Rehabilitation   Complete by: As directed    Diagnosis: Coronary Stents   After initial evaluation and assessments completed: Virtual Based Care may be provided alone or in conjunction with Phase 2 Cardiac Rehab based on patient barriers.: Yes   Call MD for:  difficulty breathing, headache or visual disturbances   Complete by: As directed    Call MD for:  extreme fatigue   Complete by: As directed    Call MD for:  hives   Complete by: As directed    Call MD for:  persistant dizziness or light-headedness   Complete by: As directed    Call MD for:  persistant nausea and vomiting   Complete by: As directed    Call MD for:  redness, tenderness, or signs of infection (pain, swelling, redness, odor or green/yellow discharge around incision site)   Complete by: As directed    Call MD for:  severe uncontrolled pain   Complete by: As directed    Call MD for:  temperature >100.4   Complete by: As directed    Diet - low sodium heart healthy   Complete by: As directed    Discharge instructions   Complete by: As directed    PLEASE DO NOT [place]!!!!! Also keep a log of you blood pressures and bring back to your follow up appt. Please call the office with any questions.   Patients taking blood thinners should generally stay away from medicines like ibuprofen, Advil, Motrin, naproxen, and Aleve due to risk of stomach bleeding. You may take Tylenol as directed or talk to your primary doctor about alternatives.  Some studies suggest Prilosec/Omeprazole interacts with Plavix. We changed your  Prilosec/Omeprazole to the equivalent dose of Protonix for less chance of interaction.   No driving for 3-5 days. No lifting over 5 lbs for 1 week. No sexual activity for 1 week. Keep procedure site clean & dry. If you notice increased pain, swelling, bleeding or pus, call/return!  You may shower, but no soaking baths/hot tubs/pools for 1 week.   Increase activity slowly   Complete by: As directed      Discharge Medications   Allergies as of 01/19/2020   No Known Allergies     Medication List    STOP taking these medications   aspirin EC 81 MG tablet   omeprazole 20 MG capsule Commonly known as: PRILOSEC Replaced by: pantoprazole 40 MG tablet  TAKE these medications   albuterol 108 (90 Base) MCG/ACT inhaler Commonly known as: VENTOLIN HFA Inhale 2 puffs into the lungs every 4 (four) hours as needed for wheezing or shortness of breath.   amLODipine 5 MG tablet Commonly known as: NORVASC Take 1.5 tablets (7.5 mg total) by mouth daily.   apixaban 5 MG Tabs tablet Commonly known as: Eliquis Take 1 tablet (5 mg total) by mouth 2 (two) times daily.   buPROPion 300 MG 24 hr tablet Commonly known as: WELLBUTRIN XL Take 300 mg by mouth daily.   clopidogrel 75 MG tablet Commonly known as: PLAVIX Take 1 tablet (75 mg total) by mouth daily with breakfast. Start taking on: Jan 20, 2020   cyclobenzaprine 10 MG tablet Commonly known as: FLEXERIL Take 10 mg by mouth 3 (three) times daily as needed for muscle spasms.   furosemide 40 MG tablet Commonly known as: LASIX Take 1 tablet (40 mg total) by mouth every other day.   isosorbide mononitrate 30 MG 24 hr tablet Commonly known as: IMDUR Take 1 tablet (30 mg total) by mouth daily. Start taking on: Jan 20, 2020 What changed:   medication strength  how much to take   metoprolol succinate 25 MG 24 hr tablet Commonly known as: Toprol XL Take 1 tablet (25 mg total) by mouth daily.   nitroGLYCERIN 0.4 MG SL  tablet Commonly known as: Nitrostat Place 1 tablet (0.4 mg total) under the tongue every 5 (five) minutes as needed for chest pain.   pantoprazole 40 MG tablet Commonly known as: PROTONIX Take 1 tablet (40 mg total) by mouth daily. Start taking on: Jan 20, 2020 Replaces: omeprazole 20 MG capsule   potassium chloride SA 20 MEQ tablet Commonly known as: KLOR-CON Take 1 tablet (20 mEq total) by mouth every other day.   rosuvastatin 40 MG tablet Commonly known as: Crestor Take 1 tablet (40 mg total) by mouth daily. What changed:   medication strength  how much to take   Stiolto Respimat 2.5-2.5 MCG/ACT Aers Generic drug: Tiotropium Bromide-Olodaterol Inhale 2 puffs into the lungs daily.   valsartan 320 MG tablet Commonly known as: DIOVAN Take 320 mg by mouth daily.       Outstanding Labs/Studies   None  Duration of Discharge Encounter   Greater than 30 minutes including physician time.  Signed, Kathyrn Drown, NP 01/19/2020, 9:39 AM   Attending Note:   The patient was seen and examined.  Agree with assessment and plan as noted above.  Changes made to the above note as needed.  Patient seen and independently examined with  Kathyrn Drown, NP .   We discussed all aspects of the encounter. I agree with the assessment and plan as stated above.    1.  Coronary artery disease: He status post atherectomy and stenting of his LAD.  He has done very well.  He is stable for discharge.  He will continue aspirin Plavix. 2.  History of cigarette smoking.  I strongly advised him to stop smoking.  He is cut back from 3 packs a day now down to 1/2 pack a day.  3.  Morbid obesity: We discussed the importance of weight loss.  He apparently eats a fairly unrestricted diet.  4.  Hypertension: We discussed restricting the salt in his diet.  Continue current medications.   I have spent a total of 40 minutes with patient reviewing hospital  notes , telemetry, EKGs, labs and examining  patient as well as establishing  an assessment and plan that was discussed with the patient. > 50% of time was spent in direct patient care.  Follow up with Dr. Harrington Challenger / APP in several weeks.   Thayer Headings, Brooke Bonito., MD, [facility] 01/19/2020, 10:44 AM [address],  [place] Pager [phone]

## 2020-01-19 NOTE — Plan of Care (Signed)
  Problem: Education: Goal: Knowledge of General Education information will improve Description Including pain rating scale, medication(s)/side effects and non-pharmacologic comfort measures Outcome: Progressing   

## 2020-01-19 NOTE — Progress Notes (Signed)
CARDIAC REHAB PHASE I   PRE:  Rate/Rhythm: 26 SR  BP:  Supine:   Sitting:   Standing: 177/81 wrist   SaO2: 94%RA  MODE:  Ambulation: 470 ft   POST:  Rate/Rhythm: 86 SR  BP:  Supine:   Sitting: 151/81 upper arm  Standing:    SaO2: 94%RA 0800-0850 Pt walked 470 ft on RA with steady gait and stated his breathing was better. No CP. Sats good on RA. Discussed importance of plavix with stent. Reviewed NTG use, heart healthy diet given, walking for ex, smoking cessation and CRP 2. Referred to [facility] smoking cessation handout. Pt has cut back from 3 ppd to 10 cigarettes. Congratulated pt on this and encouraged him to set final date of quitting. Encouraged to call [phone] if needed. Wife present for ed. Understanding voiced by pt and wife of ed.   Graylon Good, RN BSN  01/19/2020 8:45 AM

## 2020-01-19 NOTE — Progress Notes (Signed)
Error in charting.

## 2020-01-30 ENCOUNTER — Telehealth: Payer: Self-pay | Admitting: Internal Medicine

## 2020-01-30 NOTE — Telephone Encounter (Signed)
Yes ok with me

## 2020-01-30 NOTE — Telephone Encounter (Signed)
Fine with me

## 2020-01-30 NOTE — Telephone Encounter (Signed)
Spoke with pt, aware of recs.  Pt wants to clarify, he wants a second opinion and not necessarily a permanent change.  Scheduled with RB next available.  Nothing further needed at this time- will close encounter.

## 2020-01-30 NOTE — Telephone Encounter (Signed)
Dr Melvyn Novas, pt would like to switch to Dr, Lamonte Sakai or [place]. Please advise if ok to switch.

## 2020-01-30 NOTE — Telephone Encounter (Signed)
Dr. Lamonte Sakai please advise if you're ok with taking on this patient.  Thanks!

## 2020-02-01 ENCOUNTER — Ambulatory Visit: Payer: Medicare Other | Admitting: Student

## 2020-02-02 ENCOUNTER — Other Ambulatory Visit: Payer: Self-pay

## 2020-02-02 ENCOUNTER — Encounter: Payer: Self-pay | Admitting: Student

## 2020-02-02 ENCOUNTER — Ambulatory Visit (INDEPENDENT_AMBULATORY_CARE_PROVIDER_SITE_OTHER): Payer: Medicare Other | Admitting: Student

## 2020-02-02 VITALS — BP 136/72 | HR 66 | Temp 97.9°F | Ht 65.0 in | Wt 252.8 lb

## 2020-02-02 DIAGNOSIS — J449 Chronic obstructive pulmonary disease, unspecified: Secondary | ICD-10-CM

## 2020-02-02 DIAGNOSIS — F320 Major depressive disorder, single episode, mild: Secondary | ICD-10-CM | POA: Diagnosis not present

## 2020-02-02 DIAGNOSIS — E782 Mixed hyperlipidemia: Secondary | ICD-10-CM | POA: Diagnosis not present

## 2020-02-02 DIAGNOSIS — E785 Hyperlipidemia, unspecified: Secondary | ICD-10-CM

## 2020-02-02 DIAGNOSIS — I480 Paroxysmal atrial fibrillation: Secondary | ICD-10-CM | POA: Diagnosis not present

## 2020-02-02 DIAGNOSIS — I25118 Atherosclerotic heart disease of native coronary artery with other forms of angina pectoris: Secondary | ICD-10-CM | POA: Diagnosis not present

## 2020-02-02 DIAGNOSIS — I255 Ischemic cardiomyopathy: Secondary | ICD-10-CM

## 2020-02-02 DIAGNOSIS — I4891 Unspecified atrial fibrillation: Secondary | ICD-10-CM | POA: Diagnosis not present

## 2020-02-02 DIAGNOSIS — I10 Essential (primary) hypertension: Secondary | ICD-10-CM

## 2020-02-02 MED ORDER — FUROSEMIDE 40 MG PO TABS
40.0000 mg | ORAL_TABLET | Freq: Every day | ORAL | 5 refills | Status: DC | PRN
Start: 2020-02-02 — End: 2020-06-27

## 2020-02-02 MED ORDER — POTASSIUM CHLORIDE CRYS ER 20 MEQ PO TBCR: 20.0000 meq | EXTENDED_RELEASE_TABLET | Freq: Every day | ORAL | 5 refills | Status: DC | PRN

## 2020-02-02 NOTE — Progress Notes (Signed)
Cardiology Office Note    Date:  02/02/2020   ID:  Abrar, Bilton [birth year], MRN [id number]  PCP:  [name], MD  Cardiologist: [name], MD    Chief Complaint  Patient presents with  . Follow-up    s/p cardiac catheterization    History of Present Illness:    Daniel Ball is a 63 y.o. male with past medical history of CAD (s/p cath in 10/2019 showing 2-vessel CAD with 60% proximal LAD stenosis and multifocal RCA disease with a long segment of 40% proximal stenosis and irregular distal lesion of up to 70-80% which were not significant by FFR and medical management was recommended and consideration of atherectomy of LAD if refractory symptoms), paroxysmal atrial fibrillation, HTN, HLD, COPD and tobacco use who presents to the office today for follow-up from his recent cardiac catheterization.  He was last examined by myself on 01/12/2020 reported still having dyspnea on exertion with minimal activity such as performing household chores or walking to the mailbox. He had been having dyspnea for years but symptoms had acutely worsened over the past few months. He had been started on Imdur which had been further titrated without improvement, therefore options were reviewed and he was scheduled for coronary arthrectomy with Dr. Saunders Revel. This was performed on 01/18/2020 and he underwent successful orbital atherectomy and IVUS guided PCI to the proximal/mid LAD using a Synergy 3.5 x 28 mm drug-eluting stent. Was recommended to continue Eliquis and Plavix for at least 6 months then could be transitioned to Eliquis and ASA 81mg  daily. Crestor was titrated from 10 mg daily to 40mg  daily as LDL was at 78 by repeat FLP. He ambulated with cardiac rehab and was discharged home the following morning.   In talking with the patient today, he reports he was disappointed following his catheterization as he did not experience a significant improvement in his breathing. He still gets short of breath  with minimal activity such as doing chores or walking to the mailbox. Denies any associated chest pain or palpitations. No recent orthopnea, PND or edema. He was referred for a sleep study by his PCP. He also plans to follow-up with Pulmonology next month as he is scheduled for an appointment with another provider to seek a second opinion in regards to treatment options.  He reports good compliance with Eliquis and Plavix. Denies missing any doses. He does experience easy bruising.    Past Medical History:  Diagnosis Date  . Arthritis   . COPD (chronic obstructive pulmonary disease) ([place])   . Coronary artery disease    a. 12/2019: s/p orbital atherectomy and DES placement to proximal/mid LAD.   [name] GERD (gastroesophageal reflux disease)   . Hyperlipidemia   . Hypertension    hx HBP - MEDS DC'D 10 YRS since BP has been in normal range  . Impingement syndrome of shoulder    left  . Pre-diabetes   . Shortness of breath    with exertion  . Sleep apnea    couldnt tolerate CPAP  . Spinal stenosis     Past Surgical History:  Procedure Laterality Date  . CORONARY ATHERECTOMY N/A 01/18/2020   Procedure: CORONARY ATHERECTOMY;  Surgeon: Nelva Bush, MD;  Location: [facility];  Service: Cardiovascular;  Laterality: N/A;  . CORONARY STENT INTERVENTION  01/18/2020  . CORONARY STENT INTERVENTION N/A 01/18/2020   Procedure: CORONARY STENT INTERVENTION;  Surgeon: Nelva Bush, MD;  Location: [facility];  Service: Cardiovascular;  Laterality: N/A;  . EYE SURGERY Left   . HAND SURGERY Right    thumb  . INTRAVASCULAR PRESSURE WIRE/FFR STUDY N/A 11/02/2019   Procedure: INTRAVASCULAR PRESSURE WIRE/FFR STUDY;  Surgeon: Nelva Bush, MD;  Location: [facility];  Service: Cardiovascular;  Laterality: N/A;  . INTRAVASCULAR ULTRASOUND/IVUS N/A 01/18/2020   Procedure: Intravascular Ultrasound/IVUS;  Surgeon: Nelva Bush, MD;  Location: [facility];  Service:  Cardiovascular;  Laterality: N/A;  . LUMBAR LAMINECTOMY/DECOMPRESSION MICRODISCECTOMY N/A 02/07/2014   Procedure: LUMBAR DECOMPRESSION Lumbar one-Lumbar five;  Surgeon: [name], MD;  Location: [facility];  Service: Orthopedics;  Laterality: N/A;  . RIGHT/LEFT HEART CATH AND CORONARY ANGIOGRAPHY N/A 11/02/2019   Procedure: RIGHT/LEFT HEART CATH AND CORONARY ANGIOGRAPHY;  Surgeon: Nelva Bush, MD;  Location: [facility];  Service: Cardiovascular;  Laterality: N/A;  . SHOULDER ARTHROSCOPY WITH SUBACROMIAL DECOMPRESSION Left 04/12/2014   Procedure: LEFT SHOULDER ARTHROSCOPY WITH SUBACROMIAL DECOMPRESSION AND LABRAL DEBRIDEMENT, ROTATOR CUFF DEBRIDEMENT, BICEPS DEBRIDEMENT;  Surgeon: [name], MD;  Location: [facility];  Service: Orthopedics;  Laterality: Left;    Current Medications: Outpatient Medications Prior to Visit  Medication Sig Dispense Refill  . albuterol (VENTOLIN HFA) 108 (90 Base) MCG/ACT inhaler Inhale 2 puffs into the lungs every 4 (four) hours as needed for wheezing or shortness of breath. 18 g 11  . amLODipine (NORVASC) 5 MG tablet Take 1.5 tablets (7.5 mg total) by mouth daily. 135 tablet 3  . apixaban (ELIQUIS) 5 MG TABS tablet Take 1 tablet (5 mg total) by mouth 2 (two) times daily. 60 tablet 5  . buPROPion (WELLBUTRIN XL) 300 MG 24 hr tablet Take 300 mg by mouth daily.    . clopidogrel (PLAVIX) 75 MG tablet Take 1 tablet (75 mg total) by mouth daily with breakfast. 90 tablet 2  . cyclobenzaprine (FLEXERIL) 10 MG tablet Take 10 mg by mouth 3 (three) times daily as needed for muscle spasms.    . metoprolol succinate (TOPROL XL) 25 MG 24 hr tablet Take 1 tablet (25 mg total) by mouth daily. 30 tablet 9  . nitroGLYCERIN (NITROSTAT) 0.4 MG SL tablet Place 1 tablet (0.4 mg total) under the tongue every 5 (five) minutes as needed for chest pain. 25 tablet prn  . pantoprazole (PROTONIX) 40 MG tablet Take 1 tablet (40 mg total) by mouth daily. 60 tablet 2  . rosuvastatin  (CRESTOR) 40 MG tablet Take 1 tablet (40 mg total) by mouth daily. 60 tablet 2  . Tiotropium Bromide-Olodaterol (STIOLTO RESPIMAT) 2.5-2.5 MCG/ACT AERS Inhale 2 puffs into the lungs daily. 4 g 0  . valsartan (DIOVAN) 320 MG tablet Take 320 mg by mouth daily.    . furosemide (LASIX) 40 MG tablet Take 1 tablet (40 mg total) by mouth every other day. 15 tablet 1  . isosorbide mononitrate (IMDUR) 30 MG 24 hr tablet Take 1 tablet (30 mg total) by mouth daily. 30 tablet 1  . potassium chloride SA (KLOR-CON) 20 MEQ tablet Take 1 tablet (20 mEq total) by mouth every other day. 15 tablet 1   No facility-administered medications prior to visit.     Allergies:   Patient has no known allergies.   Social History   Socioeconomic History  . Marital status: Married    Spouse name: Not on file  . Number of children: Not on file  . Years of education: Not on file  . Highest education level: Not on file  Occupational History  . Occupation: Scientist, product/process development  Tobacco Use  . Smoking status: Current Every Day Smoker    Packs/day: 0.50    Years: 54.00    Pack years: 27.00    Types: Cigarettes  . Smokeless tobacco: Never Used  . Tobacco comment: 10 cigarettes a day  Substance and Sexual Activity  . Alcohol use: No  . Drug use: No  . Sexual activity: Not on file  Other Topics Concern  . Not on file  Social History Narrative  . Not on file   Social Determinants of Health   Financial Resource Strain:   . Difficulty of Paying Living Expenses:   Food Insecurity:   . Worried About Charity fundraiser in the Last Year:   . Arboriculturist in the Last Year:   Transportation Needs:   . Film/video editor (Medical):   [name] Lack of Transportation (Non-Medical):   Physical Activity:   . Days of Exercise per Week:   . Minutes of Exercise per Session:   Stress:   . Feeling of Stress :   Social Connections:   . Frequency of Communication with Friends and Family:   . Frequency of Social Gatherings with  Friends and Family:   . Attends Religious Services:   . Active Member of Clubs or Organizations:   . Attends Archivist Meetings:   [name] Marital Status:      Family History:  The patient's family history is unknown by patient.   Review of Systems:   Please see the history of present illness.     General:  No chills, fever, night sweats or weight changes.  Cardiovascular:  No chest pain, dyspnea on exertion, edema, orthopnea, palpitations, paroxysmal nocturnal dyspnea. Positive for dyspnea on exertion.  Dermatological: No rash, lesions/masses Respiratory: No cough, dyspnea Urologic: No hematuria, dysuria Abdominal:   No nausea, vomiting, diarrhea, bright red blood per rectum, melena, or hematemesis Neurologic:  No visual changes, wkns, changes in mental status. All other systems reviewed and are otherwise negative except as noted above.   Physical Exam:    VS:  BP 136/72   Pulse 66   Temp 97.9 F (36.6 C)   Ht 5\' 5"  (1.651 m)   Wt 252 lb 12.8 oz (114.7 kg)   SpO2 97%   BMI 42.07 kg/m    General: Well developed, well nourished,male appearing in no acute distress. Head: Normocephalic, atraumatic, sclera non-icteric.  Neck: No carotid bruits. JVD not elevated.  Lungs: Respirations regular and unlabored, without wheezes or rales.  Heart: Regular rate and rhythm. No S3 or S4.  No murmur, no rubs, or gallops appreciated. Abdomen: Soft, non-tender, non-distended. No obvious abdominal masses. Msk:  Strength and tone appear normal for age. No obvious joint deformities or effusions. Extremities: No clubbing or cyanosis. No lower extremity edema.  Distal pedal pulses are 2+ bilaterally. Radial cath site stable without ecchymosis or evidence of a hematoma.  Neuro: Alert and oriented X 3. Moves all extremities spontaneously. No focal deficits noted. Psych:  Responds to questions appropriately with a normal affect. Skin: No rashes or lesions noted  Wt Readings from Last 3  Encounters:  02/02/20 252 lb 12.8 oz (114.7 kg)  01/19/20 253 lb 11.2 oz (115.1 kg)  01/12/20 251 lb (113.9 kg)     Studies/Labs Reviewed:   EKG:  EKG is ordered today.  The ekg ordered today demonstrates NSR, HR 71 with no acute ST abnormalities when compared to prior tracings.   Recent Labs: 12/21/2019: NT-Pro BNP  255 01/19/2020: BUN 13; Creatinine, Ser 1.20; Hemoglobin 14.4; Platelets 131; Potassium 4.1; Sodium 140   Lipid Panel    Component Value Date/Time   CHOL 142 01/19/2020 0357   TRIG 139 01/19/2020 0357   HDL 36 (L) 01/19/2020 0357   CHOLHDL 3.9 01/19/2020 0357   VLDL 28 01/19/2020 0357   LDLCALC 78 01/19/2020 0357    Additional studies/ records that were reviewed today include:   Echocardiogram: 10/2019 IMPRESSIONS    1. Left ventricular ejection fraction, by estimation, is 60 to 65%. The  left ventricle has normal function. The left ventricle has no regional  wall motion abnormalities. There is mild concentric left ventricular  hypertrophy. Left ventricular diastolic  parameters are indeterminate.  2. Right ventricular systolic function is normal. The right ventricular  size is normal.  3. The mitral valve is grossly normal. Trivial mitral valve  regurgitation.  4. The aortic valve is tricuspid. Aortic valve regurgitation is not  visualized. Mild aortic valve sclerosis is present, with no evidence of  aortic valve stenosis.  5. The inferior vena cava is normal in size with greater than 50%  respiratory variability, suggesting right atrial pressure of 3 mmHg.   Event Monitor: 11/2019 Sinus rhythm and atrial fibrillatoin   Rates:   44 to 166 bpm   Average HR 71 bpm    Cardiac Catheterization: 12/2019 Conclusions: 1. Multivessel coronary artery disease, including 80% proximal LAD stenosis previously shown to be hemodynamically significant by DFR.  There is also moderate disease involving the distal LMCA/ostial LAD, mid/distal LAD, and OM1/OM2. 2. Mildly  elevated left ventricular filling pressure. 3. Successful orbital atherectomy and IVUS guided PCI to the proximal/mid LAD using a Synergy 3.5 x 28 mm drug-eluting stent (postdilated to 4.1 mm) with 0% residual stenosis and TIMI-3 flow.  Recommendations: 1. Overnight extended recovery. 2. If no evidence of bleeding/vascular complications, anticipate restarting apixaban tomorrow.  Would continue apixaban and clopidogrel for at least 6 months, after which time patient could be transitioned to apixaban and aspirin 81 mg daily. 3. Aggressive secondary prevention.  Fasting lipid panel to be checked with morning labs to see if rosuvastatin needs to be increased.   Assessment:    1. Coronary artery disease involving native coronary artery of native heart with other form of angina pectoris (HCC)   2. Paroxysmal atrial fibrillation ([place])   3. Essential hypertension   4. Hyperlipidemia LDL goal <70   5. Chronic obstructive pulmonary disease, unspecified COPD type ([place])      Plan:   In order of problems listed above:  1. CAD - catheterization in 10/2019 showed 2-vessel CAD with 60% proximal LAD stenosis and multifocal RCA disease with a long segment of 40% proximal stenosis and irregular distal lesion of up to 70-80% which were not significant by FFR and medical management was recommended and consideration of atherectomy of LAD if refractory symptoms. Underwent successful orbital atherectomy and IVUS guided PCI to the proximal/mid LAD using a Synergy 3.5 x 28 mm drug-eluting stent on 01/18/2020.  - He did not experience significant improvement in his breathing following stent placement. Suspect a pulmonary etiology of his dyspnea. Low-suspicion for a PE given appropriate oxygen saturations and the fact that he is on anticoagulation. - he will remain on Plavix and Eliquis for at least 6 months then can switch to ASA and Eliquis as outlined in his cath report. Continue BB and statin therapy. Will  discontinue Imdur as he did not experience improvement in his symptoms  with this.   2. Paroxysmal Atrial Fibrillation - He denies any recent palpitations. Remains on Toprol-XL 25 mg daily for rate control. He denies any evidence of active bleeding. Hemoglobin was stable at 14.4 by recent labs on 01/19/2020. Continue Eliquis 5 mg twice daily for anticoagulation.  3. HTN - BP was initially elevated at 142/84, rechecked and improved to 136/72. Given that we are discontinuing Imdur and changing Lasix to as needed dosing, I did recommend he continue to follow BP in the ambulatory setting. Continue Amlodipine 7.5 mg daily and Toprol-XL 25 mg daily.  4. HLD - FLP during admission showed total cholesterol of 142, triglycerides 139, HDL 36 and LDL 78. Crestor was titrated to 40mg  daily following stent placement. He has follow-up labs scheduled with his PCP in 02/2020 and will plan for FLP and LFT's. If not obtained, I encouraged him to reach out to [place] as lab slips can be provided.   5. COPD/Tobacco Use - He continues to smoke but has reduced his use to less than 10 cigarettes/day. He does have follow-up with Pulmonology scheduled for next month and a home sleep study is also being arranged by his PCP (he had a study 20+ years ago but was intolerant to CPAP at that time).    Medication Adjustments/Labs and Tests Ordered: Current medicines are reviewed at length with the patient today.  Concerns regarding medicines are outlined above.  Medication changes, Labs and Tests ordered today are listed in the Patient Instructions below. Patient Instructions  Medication Instructions:  Your physician has recommended you make the following change in your medication:  Take Lasix as needed  Take Potassium when you take Lasix Stop taking Imdur   *If you need a refill on your cardiac medications before your next appointment, please call your pharmacy*   Lab Work: NONE   If you have labs (blood work) drawn today  and your tests are completely normal, you will receive your results only by: [name] MyChart Message (if you have MyChart) OR . A paper copy in the mail If you have any lab test that is abnormal or we need to change your treatment, we will call you to review the results.   Testing/Procedures: NONE   Follow-Up: At [facility], you and your health needs are our priority.  As part of our continuing mission to provide you with exceptional heart care, we have created designated Provider Care Teams.  These Care Teams include your primary Cardiologist (physician) and Advanced Practice Providers (APPs -  Physician Assistants and Nurse Practitioners) who all work together to provide you with the care you need, when you need it.  We recommend signing up for the patient portal called "MyChart".  Sign up information is provided on this After Visit Summary.  MyChart is used to connect with patients for Virtual Visits (Telemedicine).  Patients are able to view lab/test results, encounter notes, upcoming appointments, etc.  Non-urgent messages can be sent to your provider as well.   To learn more about what you can do with MyChart, go to [email or web address].    Your next appointment:   3-4  month(s)  The format for your next appointment:   In Person  Provider:   [name], MD   Other Instructions Thank you for choosing [facility]!     Limit daily fluid intake to less than 2 Liters per day. Please limit salt intake.  Please weight yourself every morning. Take a Lasix tablet if weight increases by 3  pounds overnight or 5 pounds in a single week.   Signed, Erma Heritage, PA-C  02/02/2020 2:27 PM    [facility] [address] [place], [place] [address] Phone: [phone] Fax: [phone]

## 2020-02-02 NOTE — Patient Instructions (Addendum)
Medication Instructions:  Your physician has recommended you make the following change in your medication:  Take Lasix as needed  Take Potassium when you take Lasix Stop taking Imdur   *If you need a refill on your cardiac medications before your next appointment, please call your pharmacy*   Lab Work: NONE   If you have labs (blood work) drawn today and your tests are completely normal, you will receive your results only by: [name] MyChart Message (if you have MyChart) OR . A paper copy in the mail If you have any lab test that is abnormal or we need to change your treatment, we will call you to review the results.   Testing/Procedures: NONE   Follow-Up: At [facility], you and your health needs are our priority.  As part of our continuing mission to provide you with exceptional heart care, we have created designated Provider Care Teams.  These Care Teams include your primary Cardiologist (physician) and Advanced Practice Providers (APPs -  Physician Assistants and Nurse Practitioners) who all work together to provide you with the care you need, when you need it.  We recommend signing up for the patient portal called "MyChart".  Sign up information is provided on this After Visit Summary.  MyChart is used to connect with patients for Virtual Visits (Telemedicine).  Patients are able to view lab/test results, encounter notes, upcoming appointments, etc.  Non-urgent messages can be sent to your provider as well.   To learn more about what you can do with MyChart, go to [email or web address].    Your next appointment:   3-4  month(s)  The format for your next appointment:   In Person  Provider:   [name], MD   Other Instructions Thank you for choosing [facility]!     Limit daily fluid intake to less than 2 Liters per day. Please limit salt intake.  Please weight yourself every morning. Take a Lasix tablet if weight increases by 3 pounds overnight or 5 pounds  in a single week.

## 2020-02-12 ENCOUNTER — Other Ambulatory Visit: Payer: Self-pay

## 2020-02-12 DIAGNOSIS — J8489 Other specified interstitial pulmonary diseases: Secondary | ICD-10-CM | POA: Diagnosis not present

## 2020-02-12 DIAGNOSIS — J449 Chronic obstructive pulmonary disease, unspecified: Secondary | ICD-10-CM | POA: Diagnosis not present

## 2020-02-12 DIAGNOSIS — G4733 Obstructive sleep apnea (adult) (pediatric): Secondary | ICD-10-CM | POA: Diagnosis not present

## 2020-02-12 DIAGNOSIS — I25118 Atherosclerotic heart disease of native coronary artery with other forms of angina pectoris: Secondary | ICD-10-CM | POA: Diagnosis not present

## 2020-02-12 DIAGNOSIS — I10 Essential (primary) hypertension: Secondary | ICD-10-CM | POA: Diagnosis not present

## 2020-02-12 DIAGNOSIS — I4891 Unspecified atrial fibrillation: Secondary | ICD-10-CM | POA: Diagnosis not present

## 2020-02-12 MED ORDER — CLOPIDOGREL BISULFATE 75 MG PO TABS: 75.0000 mg | ORAL_TABLET | Freq: Every day | ORAL | 3 refills | Status: DC

## 2020-02-13 ENCOUNTER — Other Ambulatory Visit: Payer: Self-pay

## 2020-02-13 MED ORDER — ROSUVASTATIN CALCIUM 40 MG PO TABS: 40.0000 mg | ORAL_TABLET | Freq: Every day | ORAL | 3 refills | Status: DC

## 2020-02-22 ENCOUNTER — Ambulatory Visit: Payer: Medicare Other | Admitting: Internal Medicine

## 2020-02-26 DIAGNOSIS — G4733 Obstructive sleep apnea (adult) (pediatric): Secondary | ICD-10-CM | POA: Diagnosis not present

## 2020-02-28 ENCOUNTER — Encounter: Payer: Self-pay | Admitting: Emergency Medicine

## 2020-02-28 ENCOUNTER — Other Ambulatory Visit: Payer: Self-pay

## 2020-02-28 ENCOUNTER — Ambulatory Visit (INDEPENDENT_AMBULATORY_CARE_PROVIDER_SITE_OTHER): Payer: Medicare Other | Admitting: Emergency Medicine

## 2020-02-28 DIAGNOSIS — Z77090 Contact with and (suspected) exposure to asbestos: Secondary | ICD-10-CM

## 2020-02-28 DIAGNOSIS — G4733 Obstructive sleep apnea (adult) (pediatric): Secondary | ICD-10-CM

## 2020-02-28 DIAGNOSIS — R0600 Dyspnea, unspecified: Secondary | ICD-10-CM | POA: Diagnosis not present

## 2020-02-28 DIAGNOSIS — I255 Ischemic cardiomyopathy: Secondary | ICD-10-CM | POA: Diagnosis not present

## 2020-02-28 DIAGNOSIS — J849 Interstitial pulmonary disease, unspecified: Secondary | ICD-10-CM | POA: Diagnosis not present

## 2020-02-28 DIAGNOSIS — R0609 Other forms of dyspnea: Secondary | ICD-10-CM

## 2020-02-28 NOTE — Assessment & Plan Note (Addendum)
We will perform a split-night PSG, pursue CPAP if indicated

## 2020-02-28 NOTE — Progress Notes (Signed)
Subjective:    Patient ID: Daniel Ball, male    DOB: 1955-12-22, 64 y.o.   MRN: [id number]  HPI 64 year old active smoker (71 pack yrs) with CAD status post stent placement to the LAD 4/21, GERD, hypertension, left shoulder impingement syndrome, OSA not on CPAP. He has been followed here for COPD, also noted to have ILD on CT chest in an NSIP pattern.   He reports that he has experience slow progressive exertional SOB that occurs mainly with lifting and moving his arms or lifting - even w washing his hair. He becomes SOB with an incline. He is able to walk indefinitely on flat ground. He has cough in the am - thick and white. He has wheeze much of the day. Sounds like some possible UA noise - wife can hear it.  He was tried on Stiolto to see if he would get benefit >> didn't really notice any benefit with exertion so he stopped. He has uses albuterol - may have benefited some.   He is planning to have a repeat PSG soon - is willing to retry CPAP.  He is also about to start cardiopulm rehab. Follows with Dr Harrington Challenger.   High-resolution CT scan of the chest from 09/08/2018 reviewed by me showed an NSIP pattern with associated mild centrilobular paraseptal emphysema and bronchial wall thickening.  Little change from 2017  Reviewed CT scans of the chest 03/13/2019 and 06/06/2019.  The initial scan showed an irregular 8 mm right upper lobe nodule superimposed on his chronic interstitial disease.  The repeat scan in September showed decrease in size 6 x 6 mm consistent with a benign cause.  Pulmonary function testing reviewed by me from 08/22/2018 show grossly normal airflows with evidence for mild obstruction based on curve his flow volume loop.  Normal lung volumes and a decreased diffusion capacity that corrects to the normal range when adjusted for alveolar volume.   Review of Systems As per HPI   Past Medical History:  Diagnosis Date  . Arthritis   . COPD (chronic obstructive pulmonary  disease) ([place])   . Coronary artery disease    a. 12/2019: s/p orbital atherectomy and DES placement to proximal/mid LAD.   [name] GERD (gastroesophageal reflux disease)   . Hyperlipidemia   . Hypertension    hx HBP - MEDS DC'D 10 YRS since BP has been in normal range  . Impingement syndrome of shoulder    left  . Pre-diabetes   . Shortness of breath    with exertion  . Sleep apnea    couldnt tolerate CPAP  . Spinal stenosis      Family History  Family history unknown: Yes     Social History   Socioeconomic History  . Marital status: Married    Spouse name: Not on file  . Number of children: Not on file  . Years of education: Not on file  . Highest education level: Not on file  Occupational History  . Occupation: Pipefitter  Tobacco Use  . Smoking status: Current Every Day Smoker    Packs/day: 0.50    Years: 54.00    Pack years: 27.00    Types: Cigarettes  . Smokeless tobacco: Never Used  . Tobacco comment: 10 cigarettes a day 02/28/20 [facility]  Substance and Sexual Activity  . Alcohol use: No  . Drug use: No  . Sexual activity: Not on file  Other Topics Concern  . Not on file  Social History Narrative  . Not  on file   Social Determinants of Health   Financial Resource Strain:   . Difficulty of Paying Living Expenses:   Food Insecurity:   . Worried About Charity fundraiser in the Last Year:   . Arboriculturist in the Last Year:   Transportation Needs:   . Film/video editor (Medical):   [name] Lack of Transportation (Non-Medical):   Physical Activity:   . Days of Exercise per Week:   . Minutes of Exercise per Session:   Stress:   . Feeling of Stress :   Social Connections:   . Frequency of Communication with Friends and Family:   . Frequency of Social Gatherings with Friends and Family:   . Attends Religious Services:   . Active Member of Clubs or Organizations:   . Attends Archivist Meetings:   [name] Marital Status:   Intimate Partner Violence:   .  Fear of Current or Ex-Partner:   . Emotionally Abused:   [name] Physically Abused:   . Sexually Abused:   He worked as a Scientist, product/process development, exposures include asbestos. May have been exposed to some fumes from cutting boilers, blowtorch.  From [place],  Was [employer], went to [place], [place]    No Known Allergies   Outpatient Medications Prior to Visit  Medication Sig Dispense Refill  . amLODipine (NORVASC) 5 MG tablet Take 1.5 tablets (7.5 mg total) by mouth daily. 135 tablet 3  . apixaban (ELIQUIS) 5 MG TABS tablet Take 1 tablet (5 mg total) by mouth 2 (two) times daily. 60 tablet 5  . buPROPion (WELLBUTRIN XL) 300 MG 24 hr tablet Take 300 mg by mouth daily.    . clopidogrel (PLAVIX) 75 MG tablet Take 1 tablet (75 mg total) by mouth daily with breakfast. 90 tablet 3  . furosemide (LASIX) 40 MG tablet Take 1 tablet (40 mg total) by mouth daily as needed (for edema or weight gain.). 30 tablet 5  . nitroGLYCERIN (NITROSTAT) 0.4 MG SL tablet Place 1 tablet (0.4 mg total) under the tongue every 5 (five) minutes as needed for chest pain. 25 tablet prn  . pantoprazole (PROTONIX) 40 MG tablet Take 1 tablet (40 mg total) by mouth daily. 60 tablet 2  . potassium chloride SA (KLOR-CON) 20 MEQ tablet Take 1 tablet (20 mEq total) by mouth daily as needed (take on the days you take Lasix.). 30 tablet 5  . rosuvastatin (CRESTOR) 40 MG tablet Take 1 tablet (40 mg total) by mouth daily. 90 tablet 3  . valsartan (DIOVAN) 320 MG tablet Take 320 mg by mouth daily.    [name] albuterol (VENTOLIN HFA) 108 (90 Base) MCG/ACT inhaler Inhale 2 puffs into the lungs every 4 (four) hours as needed for wheezing or shortness of breath. (Patient not taking: Reported on 02/28/2020) 18 g 11  . cyclobenzaprine (FLEXERIL) 10 MG tablet Take 10 mg by mouth 3 (three) times daily as needed for muscle spasms.    . metoprolol succinate (TOPROL XL) 25 MG 24 hr tablet Take 1 tablet (25 mg total) by mouth daily. 30 tablet 9  . Tiotropium Bromide-Olodaterol  (STIOLTO RESPIMAT) 2.5-2.5 MCG/ACT AERS Inhale 2 puffs into the lungs daily. (Patient not taking: Reported on 02/28/2020) 4 g 0   No facility-administered medications prior to visit.        Objective:   Physical Exam Vitals:   02/28/20 0854  BP: 124/72  Pulse: 76  Temp: 98.3 F (36.8 C)  TempSrc: Oral  SpO2: 97%  Weight: 251 lb 6.4 oz (114 kg)  Height: 5\' 4"  (1.626 m)    Gen: Pleasant, well-nourished, in no distress,  normal affect  ENT: No lesions,  mouth clear,  oropharynx clear, no postnasal drip  Neck: No JVD, no stridor  Lungs: No use of accessory muscles, no crackles or wheezing on normal respiration, no wheeze on forced expiration  Cardiovascular: RRR, heart sounds normal, no murmur or gallops, no peripheral edema  Musculoskeletal: No deformities, no cyanosis or clubbing  Neuro: alert, awake, non focal  Skin: Warm, no lesions or rash      Assessment & Plan:  Asbestos exposure Likely cause of his ILD - repeat CT ordered   ILD (interstitial lung disease) ([place]) Etiology not entirely clear, possibly his asbestos exposure.  He needs serologies, hypersensitivity pneumonitis profile to complete the work-up.  Repeat CT scan  Dyspnea on exertion Likely multifactorial, he does have restriction from his ILD.  Suspect a component of obstructive disease as well.  He just underwent coronary stenting.  It could be that both his COPD and his coronary disease were impacting his functional capacity.  Now that he has had a stent placed he may notice some improvement with the Stiolto.  We will retry.  OSA (obstructive sleep apnea) We will perform a split-night PSG, pursue CPAP if indicated  Baltazar Apo, MD, PhD 02/28/2020, 5:44 PM [facility] [phone] or if no answer [phone]

## 2020-02-28 NOTE — Patient Instructions (Addendum)
We will perform a repeat high-resolution CT scan of the chest to follow your interstitial lung disease Lab work today Walking oximetry on room air today. We will retry Stiolto, 2 puffs once daily for a month to see if this changes your breathing. Agree with starting cardiopulmonary rehab to build up your cardiopulmonary conditioning Agree with sleep study.  We will follow this up with you at next visit and work on starting CPAP if indicated Continue to follow with Dr. Harrington Challenger and follow cardiology recommendations Congratulations on decreasing your smoking.  One of your main goals needs to be to work to stop altogether.  We will try to help you with this. Follow with Dr Lamonte Sakai in weeks or sooner if you have any problems

## 2020-02-28 NOTE — Assessment & Plan Note (Signed)
Likely cause of his ILD - repeat CT ordered

## 2020-02-28 NOTE — Assessment & Plan Note (Signed)
Etiology not entirely clear, possibly his asbestos exposure.  He needs serologies, hypersensitivity pneumonitis profile to complete the work-up.  Repeat CT scan

## 2020-02-28 NOTE — Assessment & Plan Note (Signed)
Likely multifactorial, he does have restriction from his ILD.  Suspect a component of obstructive disease as well.  He just underwent coronary stenting.  It could be that both his COPD and his coronary disease were impacting his functional capacity.  Now that he has had a stent placed he may notice some improvement with the Stiolto.  We will retry.

## 2020-02-29 LAB — ANTI-SCLERODERMA ANTIBODY: Scleroderma (Scl-70) (ENA) Antibody, IgG: <1 AI

## 2020-02-29 LAB — ANCA SCREEN W REFLEX TITER: ANCA Screen: NEGATIVE

## 2020-02-29 LAB — SJOGREN'S SYNDROME ANTIBODS(SSA + SSB)
SSA (Ro) (ENA) Antibody, IgG: <1 AI
SSB (La) (ENA) Antibody, IgG: <1 AI

## 2020-02-29 LAB — ANA,IFA RA DIAG PNL W/RFLX TIT/PATN
Anti Nuclear Antibody (ANA): NEGATIVE
Cyclic Citrullin Peptide Ab: <16 UNITS
Rheumatoid fact SerPl-aCnc: <14 IU/mL (ref <14)

## 2020-03-01 ENCOUNTER — Other Ambulatory Visit (HOSPITAL_BASED_OUTPATIENT_CLINIC_OR_DEPARTMENT_OTHER): Payer: Self-pay

## 2020-03-01 DIAGNOSIS — G4730 Sleep apnea, unspecified: Secondary | ICD-10-CM

## 2020-03-01 DIAGNOSIS — G4710 Hypersomnia, unspecified: Secondary | ICD-10-CM

## 2020-03-01 DIAGNOSIS — R0683 Snoring: Secondary | ICD-10-CM

## 2020-03-04 LAB — HYPERSENSITIVITY PNEUMONITIS
A. Pullulans Abs: NEGATIVE
A.Fumigatus #1 Abs: NEGATIVE
Micropolyspora faeni, IgG: NEGATIVE
Pigeon Serum Abs: NEGATIVE
Thermoact. Saccharii: NEGATIVE
Thermoactinomyces vulgaris, IgG: NEGATIVE

## 2020-03-06 ENCOUNTER — Encounter (HOSPITAL_COMMUNITY)
Admission: RE | Admit: 2020-03-06 | Discharge: 2020-03-06 | Disposition: A | Discharge status: Home / Self Care | Payer: Medicare Other | Source: Ambulatory Visit | Attending: Internal Medicine | Admitting: Internal Medicine

## 2020-03-06 ENCOUNTER — Other Ambulatory Visit: Payer: Self-pay

## 2020-03-06 ENCOUNTER — Encounter (HOSPITAL_COMMUNITY): Payer: Self-pay

## 2020-03-06 VITALS — BP 110/60 | HR 73 | Ht 64.0 in | Wt 251.1 lb

## 2020-03-06 DIAGNOSIS — Z955 Presence of coronary angioplasty implant and graft: Secondary | ICD-10-CM | POA: Diagnosis present

## 2020-03-06 NOTE — Progress Notes (Signed)
Cardiac Individual Treatment Plan  Patient Details  Name: Daniel Ball MRN: [id number] Date of Birth: [birth year] Referring Provider:     CARDIAC REHAB PHASE II ORIENTATION from 03/06/2020 in [facility]  Referring Provider Dr. Harrington Challenger      Initial Encounter Date:    CARDIAC REHAB PHASE II ORIENTATION from 03/06/2020 in [place]  Date 03/06/20      Visit Diagnosis: Status post coronary artery stent placement  Patient's Home Medications on Admission:  Current Outpatient Medications:  .  isosorbide mononitrate (IMDUR) 60 MG 24 hr tablet, Take 60 mg by mouth daily., Disp: , Rfl:  .  albuterol (VENTOLIN HFA) 108 (90 Base) MCG/ACT inhaler, Inhale 2 puffs into the lungs every 4 (four) hours as needed for wheezing or shortness of breath. (Patient taking differently: Inhale 2 puffs into the lungs daily. ), Disp: 18 g, Rfl: 11 .  amLODipine (NORVASC) 5 MG tablet, Take 1.5 tablets (7.5 mg total) by mouth daily., Disp: 135 tablet, Rfl: 3 .  apixaban (ELIQUIS) 5 MG TABS tablet, Take 1 tablet (5 mg total) by mouth 2 (two) times daily., Disp: 60 tablet, Rfl: 5 .  buPROPion (WELLBUTRIN XL) 300 MG 24 hr tablet, Take 300 mg by mouth daily., Disp: , Rfl:  .  clopidogrel (PLAVIX) 75 MG tablet, Take 1 tablet (75 mg total) by mouth daily with breakfast., Disp: 90 tablet, Rfl: 3 .  cyclobenzaprine (FLEXERIL) 10 MG tablet, Take 10 mg by mouth 3 (three) times daily as needed for muscle spasms., Disp: , Rfl:  .  furosemide (LASIX) 40 MG tablet, Take 1 tablet (40 mg total) by mouth daily as needed (for edema or weight gain.)., Disp: 30 tablet, Rfl: 5 .  metoprolol succinate (TOPROL XL) 25 MG 24 hr tablet, Take 1 tablet (25 mg total) by mouth daily., Disp: 30 tablet, Rfl: 9 .  nitroGLYCERIN (NITROSTAT) 0.4 MG SL tablet, Place 1 tablet (0.4 mg total) under the tongue every 5 (five) minutes as needed for chest pain., Disp: 25 tablet, Rfl: prn .  pantoprazole (PROTONIX)  40 MG tablet, Take 1 tablet (40 mg total) by mouth daily., Disp: 60 tablet, Rfl: 2 .  potassium chloride SA (KLOR-CON) 20 MEQ tablet, Take 1 tablet (20 mEq total) by mouth daily as needed (take on the days you take Lasix.)., Disp: 30 tablet, Rfl: 5 .  rosuvastatin (CRESTOR) 40 MG tablet, Take 1 tablet (40 mg total) by mouth daily., Disp: 90 tablet, Rfl: 3 .  Tiotropium Bromide-Olodaterol (STIOLTO RESPIMAT) 2.5-2.5 MCG/ACT AERS, Inhale 2 puffs into the lungs daily. (Patient not taking: Reported on 02/28/2020), Disp: 4 g, Rfl: 0 .  valsartan (DIOVAN) 320 MG tablet, Take 320 mg by mouth daily., Disp: , Rfl:   Past Medical History: Past Medical History:  Diagnosis Date  . Arthritis   . COPD (chronic obstructive pulmonary disease) ([place])   . Coronary artery disease    a. 12/2019: s/p orbital atherectomy and DES placement to proximal/mid LAD.   [name] GERD (gastroesophageal reflux disease)   . Hyperlipidemia   . Hypertension    hx HBP - MEDS DC'D 10 YRS since BP has been in normal range  . Impingement syndrome of shoulder    left  . Pre-diabetes   . Shortness of breath    with exertion  . Sleep apnea    couldnt tolerate CPAP  . Spinal stenosis     Tobacco Use: Social History   Tobacco Use  Smoking Status Current  Every Day Smoker  . Packs/day: 0.50  . Years: 54.00  . Pack years: 27.00  . Types: Cigarettes  Smokeless Tobacco Never Used  Tobacco Comment   10 cigarettes a day 02/28/20 [facility]    Labs: Recent Review Flowsheet Data    Labs for ITP Cardiac and Pulmonary Rehab Latest Ref Rng & Units 11/02/2019 11/02/2019 01/19/2020   Cholestrol 0 - 200 mg/dL - - 142   LDLCALC 0 - 99 mg/dL - - 78   HDL >40 mg/dL - - 36(L)   Trlycerides <150 mg/dL - - 139   PHART 7.35 - 7.45 - 7.365 -   PCO2ART 32 - 48 mmHg - 51.5(H) -   HCO3 20.0 - 28.0 mmol/L 27.1 29.4(H) -   TCO2 22 - 32 mmol/L 29 31 -   O2SAT % 67.0 99.0 -      Capillary Blood Glucose: No results found for: GLUCAP   Exercise Target  Goals: Exercise Program Goal: Individual exercise prescription set using results from initial 6 min walk test and THRR while considering  patient's activity barriers and safety.   Exercise Prescription Goal: Starting with aerobic activity 30 plus minutes a day, 3 days per week for initial exercise prescription. Provide home exercise prescription and guidelines that participant acknowledges understanding prior to discharge.  Activity Barriers & Risk Stratification:  Activity Barriers & Cardiac Risk Stratification - 03/06/20 1150      Activity Barriers & Cardiac Risk Stratification   Activity Barriers Deconditioning;Chest Pain/Angina;Other (comment)    Comments Bilateral hip pain    Cardiac Risk Stratification High           6 Minute Walk:  6 Minute Walk    Row Name 03/06/20 1120         6 Minute Walk   Phase Initial     Distance 1000 feet     Walk Time 6 minutes     # of Rest Breaks 0     MPH 1.89     METS 2.45     RPE 13     Perceived Dyspnea  14     VO2 Peak 6.13     Symptoms Yes (comment)     Comments Bilateral hip pain 6/10 and c/o of chest burning 3/10 after walking.     Resting HR 73 bpm     Resting BP 110/60     Resting Oxygen Saturation  95 %     Exercise Oxygen Saturation  during 6 min walk 97 %     Max Ex. HR 81 bpm     Max Ex. BP 150/80     2 Minute Post BP 120/80            Oxygen Initial Assessment:   Oxygen Re-Evaluation:   Oxygen Discharge (Final Oxygen Re-Evaluation):   Initial Exercise Prescription:  Initial Exercise Prescription - 03/06/20 1100      Date of Initial Exercise RX and Referring Provider   Date 03/06/20    Referring Provider Dr. Harrington Challenger    Expected Discharge Date 06/06/20      Treadmill   MPH 1.1    Grade 0    Minutes 17    METs 1.8      T5 Nustep   Level 1    SPM 62    Minutes 22    METs 2      Prescription Details   Frequency (times per week) 3    Duration Progress to 30 minutes of continuous aerobic  without  signs/symptoms of physical distress      Intensity   THRR 40-80% of Max Heartrate [phone]    Ratings of Perceived Exertion 11-13    Perceived Dyspnea 0-4      Resistance Training   Training Prescription Yes    Weight 1    Reps 10-15           Perform Capillary Blood Glucose checks as needed.  Exercise Prescription Changes:   Exercise Comments:   Exercise Comments    Row Name 03/06/20 1140           Exercise Comments Patient stopped at five minutes during walk test due to hip pain 6/10 which started to compromised his gait. He also c/o of his chest burning 3/10 after walking.              Exercise Goals and Review:   Exercise Goals    Row Name 03/06/20 1134             Exercise Goals   Increase Physical Activity Yes       Intervention Provide advice, education, support and counseling about physical activity/exercise needs.;Develop an individualized exercise prescription for aerobic and resistive training based on initial evaluation findings, risk stratification, comorbidities and participant's personal goals.       Expected Outcomes Short Term: Attend rehab on a regular basis to increase amount of physical activity.;Long Term: Add in home exercise to make exercise part of routine and to increase amount of physical activity.       Increase Strength and Stamina Yes       Intervention Provide advice, education, support and counseling about physical activity/exercise needs.;Develop an individualized exercise prescription for aerobic and resistive training based on initial evaluation findings, risk stratification, comorbidities and participant's personal goals.       Expected Outcomes Long Term: Improve cardiorespiratory fitness, muscular endurance and strength as measured by increased METs and functional capacity (6MWT);Short Term: Perform resistance training exercises routinely during rehab and add in resistance training at home       Able to understand and use rate  of perceived exertion (RPE) scale Yes       Intervention Provide education and explanation on how to use RPE scale       Expected Outcomes Short Term: Able to use RPE daily in rehab to express subjective intensity level;Long Term:  Able to use RPE to guide intensity level when exercising independently       Able to understand and use Dyspnea scale Yes       Intervention Provide education and explanation on how to use Dyspnea scale       Expected Outcomes Short Term: Able to use Dyspnea scale daily in rehab to express subjective sense of shortness of breath during exertion;Long Term: Able to use Dyspnea scale to guide intensity level when exercising independently       Knowledge and understanding of Target Heart Rate Range (THRR) Yes       Intervention Provide education and explanation of THRR including how the numbers were predicted and where they are located for reference       Expected Outcomes Long Term: Able to use THRR to govern intensity when exercising independently;Short Term: Able to state/look up THRR       Able to check pulse independently Yes       Intervention Provide education and demonstration on how to check pulse in carotid and radial arteries.;Review the importance of being able to check  your own pulse for safety during independent exercise       Expected Outcomes Short Term: Able to explain why pulse checking is important during independent exercise;Long Term: Able to check pulse independently and accurately       Understanding of Exercise Prescription Yes       Intervention Provide education, explanation, and written materials on patient's individual exercise prescription       Expected Outcomes Short Term: Able to explain program exercise prescription;Long Term: Able to explain home exercise prescription to exercise independently              Exercise Goals Re-Evaluation :    Discharge Exercise Prescription (Final Exercise Prescription Changes):   Nutrition:  Target  Goals: Understanding of nutrition guidelines, daily intake of sodium 1500mg , cholesterol 200mg , calories 30% from fat and 7% or less from saturated fats, daily to have 5 or more servings of fruits and vegetables.  Biometrics:  Pre Biometrics - 03/06/20 1136      Pre Biometrics   Height 5\' 4"  (1.626 m)    Weight 113.9 kg    Waist Circumference 50.5 inches    Hip Circumference 44 inches    Waist to Hip Ratio 1.15 %    BMI (Calculated) 43.08    Triceps Skinfold 6 mm    % Body Fat 35.5 %    Grip Strength 33.3 kg    Single Leg Stand 2.89 seconds            Nutrition Therapy Plan and Nutrition Goals:  Nutrition Therapy & Goals - 03/06/20 1054      Personal Nutrition Goals   Comments Patient says he is using weight watcher's on you-tube to use recipes to lose weight. His long term goal is to lose 40 lbs. Program goal is 10 lbs. His diet assessment medficts score was 100. Will continue to monitor.      Intervention Plan   Intervention Nutrition handout(s) given to patient.           Nutrition Assessments:  Nutrition Assessments - 03/06/20 1053      Rate Your Plate Scores   Pre Score 100           Nutrition Goals Re-Evaluation:   Nutrition Goals Discharge (Final Nutrition Goals Re-Evaluation):   Psychosocial: Target Goals: Acknowledge presence or absence of significant depression and/or stress, maximize coping skills, provide positive support system. Participant is able to verbalize types and ability to use techniques and skills needed for reducing stress and depression.  Initial Review & Psychosocial Screening:  Initial Psych Review & Screening - 03/06/20 1158      Initial Review   Current issues with None Identified      Family Dynamics   Good Support System? Yes      Barriers   Psychosocial barriers to participate in program There are no identifiable barriers or psychosocial needs.      Screening Interventions   Interventions Encouraged to  exercise;Provide feedback about the scores to participant    Expected Outcomes Short Term goal: Identification and review with participant of any Quality of Life or Depression concerns found by scoring the questionnaire.;Long Term goal: The participant improves quality of Life and PHQ9 Scores as seen by post scores and/or verbalization of changes           Quality of Life Scores:  Quality of Life - 03/06/20 1138      Quality of Life   Select Quality of Life  Quality of Life Scores   Health/Function Pre 14.04 %    Socioeconomic Pre 23.5 %    Psych/Spiritual Pre 20.43 %    Family Pre 27.6 %    GLOBAL Pre 19.45 %          Scores of 19 and below usually indicate a poorer quality of life in these areas.  A difference of  2-3 points is a clinically meaningful difference.  A difference of 2-3 points in the total score of the Quality of Life Index has been associated with significant improvement in overall quality of life, self-image, physical symptoms, and general health in studies assessing change in quality of life.  PHQ-9: Recent Review Flowsheet Data    Depression screen [facility] 2/9 03/06/2020   Decreased Interest 1   Down, Depressed, Hopeless 1   PHQ - 2 Score 2   Altered sleeping 2   Tired, decreased energy 2   Change in appetite 1   Feeling bad or failure about yourself  0   Trouble concentrating 1   Moving slowly or fidgety/restless 0   Suicidal thoughts 0   PHQ-9 Score 8   Difficult doing work/chores Somewhat difficult     Interpretation of Total Score  Total Score Depression Severity:  1-4 = Minimal depression, 5-9 = Mild depression, 10-14 = Moderate depression, 15-19 = Moderately severe depression, 20-27 = Severe depression   Psychosocial Evaluation and Intervention:  Psychosocial Evaluation - 03/06/20 1159      Psychosocial Evaluation & Interventions   Interventions Encouraged to exercise with the program and follow exercise prescription;Stress management  education;Relaxation education    Comments Patient's initial QOL score was 19.45 and  his PHQ-9 score was 8. He has no h/o depression. He says he is not depressed but his scores reflect his frustration with not being able to do the things he wants to do. He says he has good family support and a positive outlook. Will continue to monitor.    Expected Outcomes Patient will have no psychosocial issues identified at discharge.    Continue Psychosocial Services  No Follow up required           Psychosocial Re-Evaluation:   Psychosocial Discharge (Final Psychosocial Re-Evaluation):   Vocational Rehabilitation: Provide vocational rehab assistance to qualifying candidates.   Vocational Rehab Evaluation & Intervention:  Vocational Rehab - 03/06/20 1054      Initial Vocational Rehab Evaluation & Intervention   Assessment shows need for Vocational Rehabilitation No      Vocational Rehab Re-Evaulation   Comments Patient disabled.           Education: Education Goals: Education classes will be provided on a weekly basis, covering required topics. Participant will state understanding/return demonstration of topics presented.  Learning Barriers/Preferences:   Education Topics: Hypertension, Hypertension Reduction -Define heart disease and high blood pressure. Discus how high blood pressure affects the body and ways to reduce high blood pressure.   Exercise and Your Heart -Discuss why it is important to exercise, the FITT principles of exercise, normal and abnormal responses to exercise, and how to exercise safely.   Angina -Discuss definition of angina, causes of angina, treatment of angina, and how to decrease risk of having angina.   Cardiac Medications -Review what the following cardiac medications are used for, how they affect the body, and side effects that may occur when taking the medications.  Medications include Aspirin, Beta blockers, calcium channel blockers, ACE  Inhibitors, angiotensin receptor blockers, diuretics, digoxin, and antihyperlipidemics.  Congestive Heart Failure -Discuss the definition of CHF, how to live with CHF, the signs and symptoms of CHF, and how keep track of weight and sodium intake.   Heart Disease and Intimacy -Discus the effect sexual activity has on the heart, how changes occur during intimacy as we age, and safety during sexual activity.   Smoking Cessation / COPD -Discuss different methods to quit smoking, the health benefits of quitting smoking, and the definition of COPD.   Nutrition I: Fats -Discuss the types of cholesterol, what cholesterol does to the heart, and how cholesterol levels can be controlled.   Nutrition II: Labels -Discuss the different components of food labels and how to read food label   Heart Parts/Heart Disease and PAD -Discuss the anatomy of the heart, the pathway of blood circulation through the heart, and these are affected by heart disease.   Stress I: Signs and Symptoms -Discuss the causes of stress, how stress may lead to anxiety and depression, and ways to limit stress.   Stress II: Relaxation -Discuss different types of relaxation techniques to limit stress.   Warning Signs of Stroke / TIA -Discuss definition of a stroke, what the signs and symptoms are of a stroke, and how to identify when someone is having stroke.   Knowledge Questionnaire Score:  Knowledge Questionnaire Score - 03/06/20 1053      Knowledge Questionnaire Score   Pre Score 24/28           Core Components/Risk Factors/Patient Goals at Admission:  Personal Goals and Risk Factors at Admission - 03/06/20 1055      Core Components/Risk Factors/Patient Goals on Admission    Weight Management Yes    Intervention Weight Management: Provide education and appropriate resources to help participant work on and attain dietary goals.;Weight Management/Obesity: Establish reasonable short term and long term  weight goals.;Obesity: Provide education and appropriate resources to help participant work on and attain dietary goals.    Admit Weight 251 lb 1.6 oz (113.9 kg)    Goal Weight: Short Term 246 lb (111.6 kg)    Goal Weight: Long Term 241 lb (109.3 kg)    Expected Outcomes Short Term: Continue to assess and modify interventions until short term weight is achieved;Weight Gain: Understanding of general recommendations for a high calorie, high protein meal plan that promotes weight gain by distributing calorie intake throughout the day with the consumption for 4-5 meals, snacks, and/or supplements    Improve shortness of breath with ADL's Yes    Intervention Provide education, individualized exercise plan and daily activity instruction to help decrease symptoms of SOB with activities of daily living.    Expected Outcomes Short Term: Improve cardiorespiratory fitness to achieve a reduction of symptoms when performing ADLs;Long Term: Be able to perform more ADLs without symptoms or delay the onset of symptoms    Personal Goal Other Yes    Personal Goal Breathe better; Be able to do his ADL's; lose 10 lbs in program and 40 lbs long term.    Intervention Patient will attend cardiac rehab 3 days/week and supplement with exercise 2 days/week at home.    Expected Outcomes Patient will meet both program and personal goals.           Core Components/Risk Factors/Patient Goals Review:    Core Components/Risk Factors/Patient Goals at Discharge (Final Review):    ITP Comments:   Comments: Patient arrived for 1st visit/orientation/education at 0800. Patient was referred to CR by [name] due to Status Post  Stent Placement (Z95.5). During orientation advised patient on arrival and appointment times what to wear, what to do before, during and after exercise. Reviewed attendance and class policy. Talked about inclement weather and class consultation policy. Pt is scheduled to return Cardiac Rehab on 03/11/20  at 8:15. Pt was advised to come to class 15 minutes before class starts. Patient was also given instructions on meeting with the dietician and attending the Family Structure classes. Discussed RPE/Dpysnea scales. Discussed initial THR and how to find their radial and/or carotid pulse. Discussed the initial exercise prescription and how this effects their progress. Pt is eager to get started. Patient participated in warm up stretches followed by light weights and resistance bands. Patient was not able to complete 6 minute walk test. He walked for 4.5 minutes and had to stop due to bilaterl hip pain 6/10. Patient subsided to 0/10 after 2 minute rest.  Patient was measured for the equipment. Discussed equipment safety with patient. Took patient pre-anthropometric measurements. Patient finished visit at 10:30.

## 2020-03-06 NOTE — Progress Notes (Signed)
Cardiac/Pulmonary Rehab Medication Review by a Pharmacist  Does the patient  feel that his/her medications are working for him/her?  yes  Has the patient been experiencing any side effects to the medications prescribed?  no  Does the patient measure his/her own blood pressure or blood glucose at home?  yes   Does the patient have any problems obtaining medications due to transportation or finances?   no  Understanding of regimen: good Understanding of indications: good Potential of compliance: good  Questions asked to Determine Patient Understanding of Medication Regimen:  1. What is the name of the medication?  2. What is the medication used for?  3. When should it be taken?  4. How much should be taken?  5. How will you take it?  6. What side effects should you report?  Understanding Defined as: Excellent: All questions above are correct Good: Questions 1-4 are correct Fair: Questions 1-2 are correct  Poor: 1 or none of the above questions are correct   Pharmacist comments: Overall, patient has good understanding of medications and high compliance.  No further questions.    Ramond Craver 03/06/2020 9:03 AM

## 2020-03-06 NOTE — Progress Notes (Signed)
Daily Session Note  Patient Details  Name: Daniel Ball MRN: [id number] Date of Birth: [birth year] Referring Provider:    Encounter Date: 03/06/2020  Check In:  Session Check In - 03/06/20 0800      Check-In   Supervising physician immediately available to respond to emergencies See telemetry face sheet for immediately available ER MD    Location [facility]    Staff Present Aundra Dubin, RN, BSN;Vaniece Hatchett, Exercise Physiologist;Other    Virtual Visit No    Medication changes reported     No    Fall or balance concerns reported    No    Tobacco Cessation --   Patient has cut back from 3 packs/day to 10 cigerattes/day in the past year. He says he does not plan on quitting.   Current number of cigarettes/nicotine per day     10    Warm-up and Cool-down Performed as group-led instruction    Resistance Training Performed Yes    VAD Patient? No    PAD/SET Patient? No      Pain Assessment   Currently in Pain? No/denies    Pain Score 0-No pain    Multiple Pain Sites No           Capillary Blood Glucose: No results found for this or any previous visit (from the past 24 hour(s)).    Social History   Tobacco Use  Smoking Status Current Every Day Smoker  . Packs/day: 0.50  . Years: 54.00  . Pack years: 27.00  . Types: Cigarettes  Smokeless Tobacco Never Used  Tobacco Comment   10 cigarettes a day 02/28/20 [facility]    Goals Met:  Exercise tolerated well No report of cardiac concerns or symptoms Strength training completed today  Goals Unmet:  Not Applicable  Comments: Check out 10:30.   Dr. Kate Sable is Medical Director for [facility] Card[facility].

## 2020-03-11 ENCOUNTER — Encounter (HOSPITAL_COMMUNITY)
Admission: RE | Admit: 2020-03-11 | Discharge: 2020-03-11 | Disposition: A | Discharge status: Home / Self Care | Payer: Medicare Other | Source: Ambulatory Visit | Attending: Internal Medicine | Admitting: Internal Medicine

## 2020-03-11 ENCOUNTER — Other Ambulatory Visit: Payer: Self-pay

## 2020-03-11 DIAGNOSIS — Z955 Presence of coronary angioplasty implant and graft: Secondary | ICD-10-CM | POA: Diagnosis not present

## 2020-03-11 NOTE — Progress Notes (Signed)
Daily Session Note  Patient Details  Name: Daniel Ball MRN: [id number] Date of Birth: Jun 25, 1956 Referring Provider:     [place] from 03/06/2020 in [place]  Referring Provider Dr. Harrington Challenger      Encounter Date: 03/11/2020  Check In:  Session Check In - 03/11/20 5910      Check-In   Supervising physician immediately available to respond to emergencies See telemetry face sheet for immediately available ER MD    Location [facility]    Staff Present [name], Exercise Physiologist;Tippi Mccrae Kris Mouton, MS, ACSM-CEP, Exercise Physiologist    Virtual Visit No    Medication changes reported     No    Fall or balance concerns reported    No    Tobacco Cessation No Change    Current number of cigarettes/nicotine per day     10    Warm-up and Cool-down Performed as group-led instruction    Resistance Training Performed Yes    VAD Patient? No    PAD/SET Patient? No      Pain Assessment   Currently in Pain? No/denies    Pain Score 0-No pain    Multiple Pain Sites No           Capillary Blood Glucose: No results found for this or any previous visit (from the past 24 hour(s)).    Social History   Tobacco Use  Smoking Status Current Every Day Smoker  . Packs/day: 0.50  . Years: 54.00  . Pack years: 27.00  . Types: Cigarettes  Smokeless Tobacco Never Used  Tobacco Comment   10 cigarettes a day 02/28/20 [facility]    Goals Met:  Independence with exercise equipment Exercise tolerated well No report of cardiac concerns or symptoms Strength training completed today  Goals Unmet:  Not Applicable  Comments: checkout time is 0915   Dr. Kate Sable is Medical Director for [place] [facility].

## 2020-03-13 ENCOUNTER — Other Ambulatory Visit: Payer: Self-pay

## 2020-03-13 ENCOUNTER — Encounter (HOSPITAL_COMMUNITY)
Admission: RE | Admit: 2020-03-13 | Discharge: 2020-03-13 | Disposition: A | Discharge status: Home / Self Care | Payer: Medicare Other | Source: Ambulatory Visit | Attending: Internal Medicine | Admitting: Internal Medicine

## 2020-03-13 VITALS — Wt 250.9 lb

## 2020-03-13 DIAGNOSIS — Z955 Presence of coronary angioplasty implant and graft: Secondary | ICD-10-CM | POA: Diagnosis not present

## 2020-03-13 LAB — MYOMARKER 3 PLUS PROFILE (RDL)

## 2020-03-13 NOTE — Progress Notes (Signed)
Daily Session Note  Patient Details  Name: DJANGO NGUYEN MRN: [id number] Date of Birth: [birth year] Referring Provider:     [place] from 03/06/2020 in [place]  Referring Provider Dr. Harrington Challenger      Encounter Date: 03/13/2020  Check In:  Session Check In - 03/13/20 0810      Check-In   Supervising physician immediately available to respond to emergencies See telemetry face sheet for immediately available MD    Location [facility]    Staff Present [name], Exercise Physiologist;[place] Kris Mouton, MS, ACSM-CEP, Exercise Physiologist;Debra Wynetta Emery, RN, BSN    Virtual Visit No    Medication changes reported     No    Fall or balance concerns reported    No    Tobacco Cessation No Change    Warm-up and Cool-down Performed as group-led instruction    Resistance Training Performed Yes    VAD Patient? No    PAD/SET Patient? No      Pain Assessment   Currently in Pain? No/denies    Pain Score 0-No pain    Multiple Pain Sites No      2nd Pain Site   Pain Score 0           Capillary Blood Glucose: No results found for this or any previous visit (from the past 24 hour(s)).    Social History   Tobacco Use  Smoking Status Current Every Day Smoker  . Packs/day: 0.50  . Years: 54.00  . Pack years: 27.00  . Types: Cigarettes  Smokeless Tobacco Never Used  Tobacco Comment   10 cigarettes a day 02/28/20 [facility]    Goals Met:  Independence with exercise equipment Exercise tolerated well No report of cardiac concerns or symptoms Strength training completed today  Goals Unmet:  Not Applicable  Comments: Check out: 915   Dr. Kate Sable is Medical Director for [place] and [facility].

## 2020-03-14 ENCOUNTER — Ambulatory Visit (HOSPITAL_COMMUNITY)
Admission: RE | Admit: 2020-03-14 | Discharge: 2020-03-14 | Disposition: A | Discharge status: Home / Self Care | Payer: Medicare Other | Source: Ambulatory Visit | Attending: Emergency Medicine | Admitting: Emergency Medicine

## 2020-03-14 ENCOUNTER — Telehealth: Payer: Self-pay | Admitting: Emergency Medicine

## 2020-03-14 DIAGNOSIS — J849 Interstitial pulmonary disease, unspecified: Secondary | ICD-10-CM | POA: Diagnosis present

## 2020-03-14 DIAGNOSIS — R911 Solitary pulmonary nodule: Secondary | ICD-10-CM

## 2020-03-14 NOTE — Telephone Encounter (Signed)
Spoke with Narda Rutherford at [facility]- calling to give call report on HRCT done 03/14/20   IMPRESSION: 1. Irregular 9 mm solid peripheral apical right upper lobe pulmonary nodule, increased in size, worrisome for primary bronchogenic carcinoma. PET-CT suggested for further characterization. 2. Spectrum of findings compatible with fibrotic interstitial lung disease without frank honeycombing and without a clear apicobasilar gradient. No appreciable interval progression. Mild progression since the baseline 2017 high-resolution chest CT study. Findings are categorized as probable UIP per consensus guidelines: Diagnosis of Idiopathic Pulmonary Fibrosis: An Official ATS/ERS/JRS/ALAT Clinical Practice Guideline. [place], Iss 5, [phone], May 22 2017. 3. Three-vessel coronary atherosclerosis. 4. Aortic Atherosclerosis (ICD10-I70.0) and Emphysema (ICD10-J43.9).  These results will be called to the ordering clinician or representative by the Radiologist Assistant, and communication documented in the PACS or [employer].   Electronically Signed   By: Ilona Sorrel M.D.   On: 03/14/2020 14:34  Will route to Dr Lamonte Sakai marked urgent

## 2020-03-15 ENCOUNTER — Other Ambulatory Visit: Payer: Self-pay

## 2020-03-15 ENCOUNTER — Encounter (HOSPITAL_COMMUNITY): Payer: Medicare Other

## 2020-03-15 ENCOUNTER — Other Ambulatory Visit (HOSPITAL_COMMUNITY)
Admission: RE | Admit: 2020-03-15 | Discharge: 2020-03-15 | Disposition: A | Discharge status: Home / Self Care | Payer: Medicare Other | Source: Ambulatory Visit | Attending: Internal Medicine | Admitting: Internal Medicine

## 2020-03-15 DIAGNOSIS — Z20822 Contact with and (suspected) exposure to covid-19: Secondary | ICD-10-CM | POA: Insufficient documentation

## 2020-03-15 DIAGNOSIS — Z01812 Encounter for preprocedural laboratory examination: Secondary | ICD-10-CM | POA: Diagnosis present

## 2020-03-15 LAB — SARS CORONAVIRUS 2 (TAT 6-24 HRS): SARS Coronavirus 2: NEGATIVE

## 2020-03-15 NOTE — Telephone Encounter (Signed)
Reviewed CT chest with the patient today.  His ILD is related more stable but his right upper lobe pulmonary nodule may be increasing in size.  We discussed the pros and cons of getting a PET scan versus just repeating his CT after several months.  We decided to go ahead with the PET scan now.  Plan to order this at [facility] which is close to his home.

## 2020-03-18 ENCOUNTER — Ambulatory Visit: Payer: Medicare Other | Attending: Internal Medicine | Admitting: Internal Medicine

## 2020-03-18 ENCOUNTER — Other Ambulatory Visit: Payer: Self-pay

## 2020-03-18 ENCOUNTER — Encounter (HOSPITAL_COMMUNITY): Payer: Medicare Other

## 2020-03-18 DIAGNOSIS — R0683 Snoring: Secondary | ICD-10-CM

## 2020-03-18 DIAGNOSIS — G4733 Obstructive sleep apnea (adult) (pediatric): Secondary | ICD-10-CM | POA: Insufficient documentation

## 2020-03-18 DIAGNOSIS — G4710 Hypersomnia, unspecified: Secondary | ICD-10-CM

## 2020-03-18 DIAGNOSIS — G4730 Sleep apnea, unspecified: Secondary | ICD-10-CM

## 2020-03-20 ENCOUNTER — Encounter (HOSPITAL_COMMUNITY)
Admission: RE | Admit: 2020-03-20 | Discharge: 2020-03-20 | Disposition: A | Discharge status: Home / Self Care | Payer: Medicare Other | Source: Ambulatory Visit | Attending: Internal Medicine | Admitting: Internal Medicine

## 2020-03-20 ENCOUNTER — Other Ambulatory Visit: Payer: Self-pay

## 2020-03-20 DIAGNOSIS — Z955 Presence of coronary angioplasty implant and graft: Secondary | ICD-10-CM | POA: Diagnosis not present

## 2020-03-20 NOTE — Progress Notes (Signed)
Cardiac Individual Treatment Plan  Patient Details  Name: Daniel Ball MRN: [id number] Date of Birth: [birth year] Referring Provider:     [facility] from 03/06/2020 in [place]  Referring Provider Dr. Harrington Challenger      Initial Encounter Date:    CARDIAC REHAB PHASE II ORIENTATION from 03/06/2020 in [place]  Date 03/06/20      Visit Diagnosis: Status post coronary artery stent placement  Patient's Home Medications on Admission:  Current Outpatient Medications:  .  albuterol (VENTOLIN HFA) 108 (90 Base) MCG/ACT inhaler, Inhale 2 puffs into the lungs every 4 (four) hours as needed for wheezing or shortness of breath. (Patient taking differently: Inhale 2 puffs into the lungs daily. ), Disp: 18 g, Rfl: 11 .  amLODipine (NORVASC) 5 MG tablet, Take 1.5 tablets (7.5 mg total) by mouth daily., Disp: 135 tablet, Rfl: 3 .  apixaban (ELIQUIS) 5 MG TABS tablet, Take 1 tablet (5 mg total) by mouth 2 (two) times daily., Disp: 60 tablet, Rfl: 5 .  buPROPion (WELLBUTRIN XL) 300 MG 24 hr tablet, Take 300 mg by mouth daily., Disp: , Rfl:  .  clopidogrel (PLAVIX) 75 MG tablet, Take 1 tablet (75 mg total) by mouth daily with breakfast., Disp: 90 tablet, Rfl: 3 .  cyclobenzaprine (FLEXERIL) 10 MG tablet, Take 10 mg by mouth 3 (three) times daily as needed for muscle spasms., Disp: , Rfl:  .  furosemide (LASIX) 40 MG tablet, Take 1 tablet (40 mg total) by mouth daily as needed (for edema or weight gain.)., Disp: 30 tablet, Rfl: 5 .  isosorbide mononitrate (IMDUR) 60 MG 24 hr tablet, Take 60 mg by mouth daily., Disp: , Rfl:  .  metoprolol succinate (TOPROL XL) 25 MG 24 hr tablet, Take 1 tablet (25 mg total) by mouth daily., Disp: 30 tablet, Rfl: 9 .  nitroGLYCERIN (NITROSTAT) 0.4 MG SL tablet, Place 1 tablet (0.4 mg total) under the tongue every 5 (five) minutes as needed for chest pain., Disp: 25 tablet, Rfl: prn .  pantoprazole (PROTONIX)  40 MG tablet, Take 1 tablet (40 mg total) by mouth daily., Disp: 60 tablet, Rfl: 2 .  potassium chloride SA (KLOR-CON) 20 MEQ tablet, Take 1 tablet (20 mEq total) by mouth daily as needed (take on the days you take Lasix.)., Disp: 30 tablet, Rfl: 5 .  rosuvastatin (CRESTOR) 40 MG tablet, Take 1 tablet (40 mg total) by mouth daily., Disp: 90 tablet, Rfl: 3 .  Tiotropium Bromide-Olodaterol (STIOLTO RESPIMAT) 2.5-2.5 MCG/ACT AERS, Inhale 2 puffs into the lungs daily. (Patient not taking: Reported on 02/28/2020), Disp: 4 g, Rfl: 0 .  valsartan (DIOVAN) 320 MG tablet, Take 320 mg by mouth daily., Disp: , Rfl:   Past Medical History: Past Medical History:  Diagnosis Date  . Arthritis   . COPD (chronic obstructive pulmonary disease) ([place])   . Coronary artery disease    a. 12/2019: s/p orbital atherectomy and DES placement to proximal/mid LAD.   [name] GERD (gastroesophageal reflux disease)   . Hyperlipidemia   . Hypertension    hx HBP - MEDS DC'D 10 YRS since BP has been in normal range  . Impingement syndrome of shoulder    left  . Pre-diabetes   . Shortness of breath    with exertion  . Sleep apnea    couldnt tolerate CPAP  . Spinal stenosis     Tobacco Use: Social History   Tobacco Use  Smoking Status Current  Every Day Smoker  . Packs/day: 0.50  . Years: 54.00  . Pack years: 27.00  . Types: Cigarettes  Smokeless Tobacco Never Used  Tobacco Comment   10 cigarettes a day 02/28/20 [facility]    Labs: Recent Review Flowsheet Data    Labs for ITP Cardiac and Pulmonary Rehab Latest Ref Rng & Units 11/02/2019 11/02/2019 01/19/2020   Cholestrol 0 - 200 mg/dL - - 142   LDLCALC 0 - 99 mg/dL - - 78   HDL >40 mg/dL - - 36(L)   Trlycerides <150 mg/dL - - 139   PHART 7.35 - 7.45 - 7.365 -   PCO2ART 32 - 48 mmHg - 51.5(H) -   HCO3 20.0 - 28.0 mmol/L 27.1 29.4(H) -   TCO2 22 - 32 mmol/L 29 31 -   O2SAT % 67.0 99.0 -      Capillary Blood Glucose: No results found for: GLUCAP   Exercise Target  Goals: Exercise Program Goal: Individual exercise prescription set using results from initial 6 min walk test and THRR while considering  patient's activity barriers and safety.   Exercise Prescription Goal: Starting with aerobic activity 30 plus minutes a day, 3 days per week for initial exercise prescription. Provide home exercise prescription and guidelines that participant acknowledges understanding prior to discharge.  Activity Barriers & Risk Stratification:  Activity Barriers & Cardiac Risk Stratification - 03/06/20 1150      Activity Barriers & Cardiac Risk Stratification   Activity Barriers Deconditioning;Chest Pain/Angina;Other (comment)    Comments Bilateral hip pain    Cardiac Risk Stratification High           6 Minute Walk:  6 Minute Walk    Row Name 03/06/20 1120         6 Minute Walk   Phase Initial     Distance 1000 feet     Walk Time 6 minutes     # of Rest Breaks 0     MPH 1.89     METS 2.45     RPE 13     Perceived Dyspnea  14     VO2 Peak 6.13     Symptoms Yes (comment)     Comments Bilateral hip pain 6/10 and c/o of chest burning 3/10 after walking.     Resting HR 73 bpm     Resting BP 110/60     Resting Oxygen Saturation  95 %     Exercise Oxygen Saturation  during 6 min walk 97 %     Max Ex. HR 81 bpm     Max Ex. BP 150/80     2 Minute Post BP 120/80            Oxygen Initial Assessment:   Oxygen Re-Evaluation:   Oxygen Discharge (Final Oxygen Re-Evaluation):   Initial Exercise Prescription:  Initial Exercise Prescription - 03/06/20 1100      Date of Initial Exercise RX and Referring Provider   Date 03/06/20    Referring Provider Dr. Harrington Challenger    Expected Discharge Date 06/06/20      Treadmill   MPH 1.1    Grade 0    Minutes 17    METs 1.8      T5 Nustep   Level 1    SPM 62    Minutes 22    METs 2      Prescription Details   Frequency (times per week) 3    Duration Progress to 30 minutes of continuous aerobic  without  signs/symptoms of physical distress      Intensity   THRR 40-80% of Max Heartrate [phone]    Ratings of Perceived Exertion 11-13    Perceived Dyspnea 0-4      Resistance Training   Training Prescription Yes    Weight 1    Reps 10-15           Perform Capillary Blood Glucose checks as needed.  Exercise Prescription Changes:  Exercise Prescription Changes    Row Name 03/13/20 1134             Response to Exercise   Blood Pressure (Admit) 132/64       Blood Pressure (Exercise) 138/70       Blood Pressure (Exit) 118/64       Heart Rate (Admit) 64 bpm       Heart Rate (Exercise) 84 bpm       Heart Rate (Exit) 78 bpm       Rating of Perceived Exertion (Exercise) 11       Duration Continue with 30 min of aerobic exercise without signs/symptoms of physical distress.       Intensity THRR unchanged         Progression   Progression Continue to progress workloads to maintain intensity without signs/symptoms of physical distress.         Resistance Training   Training Prescription Yes       Weight 2 lbs       Reps 10-15         Treadmill   MPH 1.1       Grade 0       Minutes 17       METs 1.8         T5 Nustep   Level 1       SPM 111       Minutes 22       METs 2.1              Exercise Comments:  Exercise Comments    Row Name 03/06/20 1140 03/11/20 1004 03/20/20 1434       Exercise Comments Patient stopped at five minutes during walk test due to hip pain 6/10 which started to compromised his gait. He also c/o of his chest burning 3/10 after walking. Patient completed his first day of exercise in cardiac rehab. He complained of hip pain while on the treadmill (7/10 R hip and 4/10 in L hip). Otherwise he tolerated exercise well. Patient is fairly new to the program and has been preforming well so far.            Exercise Goals and Review:  Exercise Goals    Row Name 03/06/20 1134             Exercise Goals   Increase Physical Activity Yes        Intervention Provide advice, education, support and counseling about physical activity/exercise needs.;Develop an individualized exercise prescription for aerobic and resistive training based on initial evaluation findings, risk stratification, comorbidities and participant's personal goals.       Expected Outcomes Short Term: Attend rehab on a regular basis to increase amount of physical activity.;Long Term: Add in home exercise to make exercise part of routine and to increase amount of physical activity.       Increase Strength and Stamina Yes       Intervention Provide advice, education, support and counseling about physical activity/exercise needs.;Develop an individualized exercise  prescription for aerobic and resistive training based on initial evaluation findings, risk stratification, comorbidities and participant's personal goals.       Expected Outcomes Long Term: Improve cardiorespiratory fitness, muscular endurance and strength as measured by increased METs and functional capacity (6MWT);Short Term: Perform resistance training exercises routinely during rehab and add in resistance training at home       Able to understand and use rate of perceived exertion (RPE) scale Yes       Intervention Provide education and explanation on how to use RPE scale       Expected Outcomes Short Term: Able to use RPE daily in rehab to express subjective intensity level;Long Term:  Able to use RPE to guide intensity level when exercising independently       Able to understand and use Dyspnea scale Yes       Intervention Provide education and explanation on how to use Dyspnea scale       Expected Outcomes Short Term: Able to use Dyspnea scale daily in rehab to express subjective sense of shortness of breath during exertion;Long Term: Able to use Dyspnea scale to guide intensity level when exercising independently       Knowledge and understanding of Target Heart Rate Range (THRR) Yes       Intervention Provide  education and explanation of THRR including how the numbers were predicted and where they are located for reference       Expected Outcomes Long Term: Able to use THRR to govern intensity when exercising independently;Short Term: Able to state/look up THRR       Able to check pulse independently Yes       Intervention Provide education and demonstration on how to check pulse in carotid and radial arteries.;Review the importance of being able to check your own pulse for safety during independent exercise       Expected Outcomes Short Term: Able to explain why pulse checking is important during independent exercise;Long Term: Able to check pulse independently and accurately       Understanding of Exercise Prescription Yes       Intervention Provide education, explanation, and written materials on patient's individual exercise prescription       Expected Outcomes Short Term: Able to explain program exercise prescription;Long Term: Able to explain home exercise prescription to exercise independently              Exercise Goals Re-Evaluation :    Discharge Exercise Prescription (Final Exercise Prescription Changes):  Exercise Prescription Changes - 03/13/20 1134      Response to Exercise   Blood Pressure (Admit) 132/64    Blood Pressure (Exercise) 138/70    Blood Pressure (Exit) 118/64    Heart Rate (Admit) 64 bpm    Heart Rate (Exercise) 84 bpm    Heart Rate (Exit) 78 bpm    Rating of Perceived Exertion (Exercise) 11    Duration Continue with 30 min of aerobic exercise without signs/symptoms of physical distress.    Intensity THRR unchanged      Progression   Progression Continue to progress workloads to maintain intensity without signs/symptoms of physical distress.      Resistance Training   Training Prescription Yes    Weight 2 lbs    Reps 10-15      Treadmill   MPH 1.1    Grade 0    Minutes 17    METs 1.8      T5 Nustep   Level 1  SPM 111    Minutes 22    METs 2.1             Nutrition:  Target Goals: Understanding of nutrition guidelines, daily intake of sodium 1500mg , cholesterol 200mg , calories 30% from fat and 7% or less from saturated fats, daily to have 5 or more servings of fruits and vegetables.  Biometrics:  Pre Biometrics - 03/06/20 1136      Pre Biometrics   Height 5\' 4"  (1.626 m)    Weight 113.9 kg    Waist Circumference 50.5 inches    Hip Circumference 44 inches    Waist to Hip Ratio 1.15 %    BMI (Calculated) 43.08    Triceps Skinfold 6 mm    % Body Fat 35.5 %    Grip Strength 33.3 kg    Single Leg Stand 2.89 seconds            Nutrition Therapy Plan and Nutrition Goals:  Nutrition Therapy & Goals - 03/20/20 1456      Personal Nutrition Goals   Comments Patient continues to says he is using weight watcher's on you-tube to use recipes to lose weight. His long term goal is to lose 40 lbs. Program goal is 10 lbs. His diet assessment medficts score was 100. Will continue to monitor.      Intervention Plan   Intervention Nutrition handout(s) given to patient.           Nutrition Assessments:  Nutrition Assessments - 03/06/20 1053      Rate Your Plate Scores   Pre Score 100           Nutrition Goals Re-Evaluation:   Nutrition Goals Discharge (Final Nutrition Goals Re-Evaluation):   Psychosocial: Target Goals: Acknowledge presence or absence of significant depression and/or stress, maximize coping skills, provide positive support system. Participant is able to verbalize types and ability to use techniques and skills needed for reducing stress and depression.  Initial Review & Psychosocial Screening:  Initial Psych Review & Screening - 03/06/20 1158      Initial Review   Current issues with None Identified      Family Dynamics   Good Support System? Yes      Barriers   Psychosocial barriers to participate in program There are no identifiable barriers or psychosocial needs.      Screening  Interventions   Interventions Encouraged to exercise;Provide feedback about the scores to participant    Expected Outcomes Short Term goal: Identification and review with participant of any Quality of Life or Depression concerns found by scoring the questionnaire.;Long Term goal: The participant improves quality of Life and PHQ9 Scores as seen by post scores and/or verbalization of changes           Quality of Life Scores:  Quality of Life - 03/06/20 1138      Quality of Life   Select Quality of Life      Quality of Life Scores   Health/Function Pre 14.04 %    Socioeconomic Pre 23.5 %    Psych/Spiritual Pre 20.43 %    Family Pre 27.6 %    GLOBAL Pre 19.45 %          Scores of 19 and below usually indicate a poorer quality of life in these areas.  A difference of  2-3 points is a clinically meaningful difference.  A difference of 2-3 points in the total score of the Quality of Life Index has been associated with significant  improvement in overall quality of life, Ball-image, physical symptoms, and general health in studies assessing change in quality of life.  PHQ-9: Recent Review Flowsheet Data    Depression screen [facility] 2/9 03/06/2020   Decreased Interest 1   Down, Depressed, Hopeless 1   PHQ - 2 Score 2   Altered sleeping 2   Tired, decreased energy 2   Change in appetite 1   Feeling bad or failure about yourself  0   Trouble concentrating 1   Moving slowly or fidgety/restless 0   Suicidal thoughts 0   PHQ-9 Score 8   Difficult doing work/chores Somewhat difficult     Interpretation of Total Score  Total Score Depression Severity:  1-4 = Minimal depression, 5-9 = Mild depression, 10-14 = Moderate depression, 15-19 = Moderately severe depression, 20-27 = Severe depression   Psychosocial Evaluation and Intervention:  Psychosocial Evaluation - 03/06/20 1159      Psychosocial Evaluation & Interventions   Interventions Encouraged to exercise with the program and follow  exercise prescription;Stress management education;Relaxation education    Comments Patient's initial QOL score was 19.45 and  his PHQ-9 score was 8. He has no h/o depression. He says he is not depressed but his scores reflect his frustration with not being able to do the things he wants to do. He says he has good family support and a positive outlook. Will continue to monitor.    Expected Outcomes Patient will have no psychosocial issues identified at discharge.    Continue Psychosocial Services  No Follow up required           Psychosocial Re-Evaluation:  Psychosocial Re-Evaluation    [place] Name 03/20/20 1456             Psychosocial Re-Evaluation   Current issues with None Identified       Comments Patient's initial QOL score was 19.45 and his PHQ-9 score was 8. He says he does feels frustruated at times due to not being able to do the things he wants to do. He hopes this will improve as he continues the program. Will continue to montior.       Expected Outcomes Patient will have no psychosocial issues identified at discharge with improved QOL and PHQ-9 scores.       Interventions Stress management education;Encouraged to attend Cardiac Rehabilitation for the exercise;Relaxation education       Continue Psychosocial Services  No Follow up required              Psychosocial Discharge (Final Psychosocial Re-Evaluation):  Psychosocial Re-Evaluation - 03/20/20 1456      Psychosocial Re-Evaluation   Current issues with None Identified    Comments Patient's initial QOL score was 19.45 and his PHQ-9 score was 8. He says he does feels frustruated at times due to not being able to do the things he wants to do. He hopes this will improve as he continues the program. Will continue to montior.    Expected Outcomes Patient will have no psychosocial issues identified at discharge with improved QOL and PHQ-9 scores.    Interventions Stress management education;Encouraged to attend Cardiac  Rehabilitation for the exercise;Relaxation education    Continue Psychosocial Services  No Follow up required           Vocational Rehabilitation: Provide vocational rehab assistance to qualifying candidates.   Vocational Rehab Evaluation & Intervention:  Vocational Rehab - 03/06/20 1054      Initial Vocational Rehab Evaluation & Intervention  Assessment shows need for Vocational Rehabilitation No      Vocational Rehab Re-Evaulation   Comments Patient disabled.           Education: Education Goals: Education classes will be provided on a weekly basis, covering required topics. Participant will state understanding/return demonstration of topics presented.  Learning Barriers/Preferences:   Education Topics: Hypertension, Hypertension Reduction -Define heart disease and high blood pressure. Discus how high blood pressure affects the body and ways to reduce high blood pressure.   Exercise and Your Heart -Discuss why it is important to exercise, the FITT principles of exercise, normal and abnormal responses to exercise, and how to exercise safely.   Angina -Discuss definition of angina, causes of angina, treatment of angina, and how to decrease risk of having angina.   Cardiac Medications -Review what the following cardiac medications are used for, how they affect the body, and side effects that may occur when taking the medications.  Medications include Aspirin, Beta blockers, calcium channel blockers, ACE Inhibitors, angiotensin receptor blockers, diuretics, digoxin, and antihyperlipidemics.   Congestive Heart Failure -Discuss the definition of CHF, how to live with CHF, the signs and symptoms of CHF, and how keep track of weight and sodium intake.   Heart Disease and Intimacy -Discus the effect sexual activity has on the heart, how changes occur during intimacy as we age, and safety during sexual activity.   Smoking Cessation / COPD -Discuss different methods to  quit smoking, the health benefits of quitting smoking, and the definition of COPD.   Nutrition I: Fats -Discuss the types of cholesterol, what cholesterol does to the heart, and how cholesterol levels can be controlled.   Nutrition II: Labels -Discuss the different components of food labels and how to read food label   Heart Parts/Heart Disease and PAD -Discuss the anatomy of the heart, the pathway of blood circulation through the heart, and these are affected by heart disease.   Stress I: Signs and Symptoms -Discuss the causes of stress, how stress may lead to anxiety and depression, and ways to limit stress.   Stress II: Relaxation -Discuss different types of relaxation techniques to limit stress.   CARDIAC REHAB PHASE II EXERCISE from 03/20/2020 in [place]  Date 03/13/20  Educator [place]  Instruction Review Code 1- Verbalizes Understanding      Warning Signs of Stroke / TIA -Discuss definition of a stroke, what the signs and symptoms are of a stroke, and how to identify when someone is having stroke.   CARDIAC REHAB PHASE II EXERCISE from 03/20/2020 in [place]  Date 03/20/20  Educator Etheleen Mayhew  Instruction Review Code 1- Verbalizes Understanding      Knowledge Questionnaire Score:  Knowledge Questionnaire Score - 03/06/20 1053      Knowledge Questionnaire Score   Pre Score 24/28           Core Components/Risk Factors/Patient Goals at Admission:  Personal Goals and Risk Factors at Admission - 03/06/20 1055      Core Components/Risk Factors/Patient Goals on Admission    Weight Management Yes    Intervention Weight Management: Provide education and appropriate resources to help participant work on and attain dietary goals.;Weight Management/Obesity: Establish reasonable short term and long term weight goals.;Obesity: Provide education and appropriate resources to help participant work on and attain dietary goals.     Admit Weight 251 lb 1.6 oz (113.9 kg)    Goal Weight: Short Term 246 lb (111.6 kg)  Goal Weight: Long Term 241 lb (109.3 kg)    Expected Outcomes Short Term: Continue to assess and modify interventions until short term weight is achieved;Weight Gain: Understanding of general recommendations for a high calorie, high protein meal plan that promotes weight gain by distributing calorie intake throughout the day with the consumption for 4-5 meals, snacks, and/or supplements    Improve shortness of breath with ADL's Yes    Intervention Provide education, individualized exercise plan and daily activity instruction to help decrease symptoms of SOB with activities of daily living.    Expected Outcomes Short Term: Improve cardiorespiratory fitness to achieve a reduction of symptoms when performing ADLs;Long Term: Be able to perform more ADLs without symptoms or delay the onset of symptoms    Personal Goal Other Yes    Personal Goal Breathe better; Be able to do his ADL's; lose 10 lbs in program and 40 lbs long term.    Intervention Patient will attend cardiac rehab 3 days/week and supplement with exercise 2 days/week at home.    Expected Outcomes Patient will meet both program and personal goals.           Core Components/Risk Factors/Patient Goals Review:   Goals and Risk Factor Review    Row Name 03/20/20 1459             Core Components/Risk Factors/Patient Goals Review   Personal Goals Review Weight Management/Obesity;Other  Lose 10 lbs in program; 40 lbs long term; be able to do his ADL's.       Review Patient is new to the program completing 4 sessions maintaining his weight since his initial visit. He continues to smoke 10 cigerattes/day and says he is not interested in quitting at this time. He will continue to work toward meeting both program and personal goals. Will continue to monitor.       Expected Outcomes Patient will continue to attend the sessions and meet both program and  personal goals.              Core Components/Risk Factors/Patient Goals at Discharge (Final Review):   Goals and Risk Factor Review - 03/20/20 1459      Core Components/Risk Factors/Patient Goals Review   Personal Goals Review Weight Management/Obesity;Other   Lose 10 lbs in program; 40 lbs long term; be able to do his ADL's.   Review Patient is new to the program completing 4 sessions maintaining his weight since his initial visit. He continues to smoke 10 cigerattes/day and says he is not interested in quitting at this time. He will continue to work toward meeting both program and personal goals. Will continue to monitor.    Expected Outcomes Patient will continue to attend the sessions and meet both program and personal goals.           ITP Comments:   Comments: ITP REVIEW Patient is new to the program completing 4 sessions. Will continue to monitor for progress.

## 2020-03-20 NOTE — Progress Notes (Signed)
Daily Session Note  Patient Details  Name: Daniel Ball MRN: [id number] Date of Birth: [birth year] Referring Provider:     CARDIAC REHAB PHASE II ORIENTATION from 03/06/2020 in [place]  Referring Provider Dr. Harrington Challenger      Encounter Date: 03/20/2020  Check In:  Session Check In - 03/20/20 0812      Check-In   Supervising physician immediately available to respond to emergencies See telemetry face sheet for immediately available MD    Location [facility]    Staff Present [name], Exercise Physiologist;Shafiq Larch Rosezella [place], RN, Madelaine Bhat, RN, BSN    Virtual Visit No    Medication changes reported     No    Fall or balance concerns reported    No    Tobacco Cessation No Change    Warm-up and Cool-down Performed as group-led instruction    Resistance Training Performed Yes    VAD Patient? No    PAD/SET Patient? No      Pain Assessment   Currently in Pain? No/denies    Pain Score 0-No pain    Multiple Pain Sites No           Capillary Blood Glucose: No results found for this or any previous visit (from the past 24 hour(s)).    Social History   Tobacco Use  Smoking Status Current Every Day Smoker  . Packs/day: 0.50  . Years: 54.00  . Pack years: 27.00  . Types: Cigarettes  Smokeless Tobacco Never Used  Tobacco Comment   10 cigarettes a day 02/28/20 ARJ    Goals Met:  Independence with exercise equipment Exercise tolerated well No report of cardiac concerns or symptoms Strength training completed today  Goals Unmet:  Not Applicable  Comments: session 8:15-9:15   Dr. Kathie Dike is Medical Director for [facility].

## 2020-03-22 ENCOUNTER — Encounter (HOSPITAL_COMMUNITY): Payer: Medicare Other

## 2020-03-22 ENCOUNTER — Encounter (HOSPITAL_COMMUNITY)
Admission: RE | Admit: 2020-03-22 | Discharge: 2020-03-22 | Disposition: A | Discharge status: Home / Self Care | Payer: Medicare Other | Source: Ambulatory Visit | Attending: Internal Medicine | Admitting: Internal Medicine

## 2020-03-22 ENCOUNTER — Other Ambulatory Visit: Payer: Self-pay

## 2020-03-22 DIAGNOSIS — Z955 Presence of coronary angioplasty implant and graft: Secondary | ICD-10-CM | POA: Diagnosis not present

## 2020-03-22 NOTE — Progress Notes (Signed)
Daily Session Note  Patient Details  Name: Daniel Ball MRN: [id number] Date of Birth: Jan 25, 1956 Referring Provider:     [place] from 03/06/2020 in [place]  Referring Provider Dr. Harrington Challenger      Encounter Date: 03/22/2020  Check In:  Session Check In - 03/22/20 0816      Check-In   Supervising physician immediately available to respond to emergencies See telemetry face sheet for immediately available MD    [facility]    Staff Present [name], Exercise Physiologist;Nery Kalisz Kris Mouton, MS, ACSM-CEP, Exercise Physiologist    Virtual Visit No    Medication changes reported     No    Fall or balance concerns reported    No    Tobacco Cessation No Change    Current number of cigarettes/nicotine per day     10    Warm-up and Cool-down Performed as group-led instruction    Resistance Training Performed Yes    VAD Patient? No    PAD/SET Patient? No      Pain Assessment   Currently in Pain? No/denies    Pain Score 0-No pain    Multiple Pain Sites No      2nd Pain Site   Pain Score 0           Capillary Blood Glucose: No results found for this or any previous visit (from the past 24 hour(s)).    Social History   Tobacco Use  Smoking Status Current Every Day Smoker  . Packs/day: 0.50  . Years: 54.00  . Pack years: 27.00  . Types: Cigarettes  Smokeless Tobacco Never Used  Tobacco Comment   10 cigarettes a day 02/28/20 ARJ    Goals Met:  Independence with exercise equipment Exercise tolerated well Strength training completed today  Goals Unmet:  Not Applicable  Comments: checkout time is 0915   Dr. Kate Sable is Medical Director for [facility].

## 2020-03-23 NOTE — Procedures (Signed)
   NAME: Daniel Ball DATE OF BIRTH:  1956-07-23 MEDICAL RECORD NUMBER [id number]  LOCATION: [facility]  PHYSICIAN: Marius Ditch  DATE OF STUDY: 03/18/2020  SLEEP STUDY TYPE: Positive Airway Pressure Titration               REFERRING PHYSICIAN: Marius Ditch, MD  INDICATION FOR STUDY: past history of intolerance to CPAP.   EPWORTH SLEEPINESS SCORE:  NA HEIGHT:    WEIGHT:      There is no height or weight on file to calculate BMI.  NECK SIZE:   in.  MEDICATIONS Patient self administered medications include: N/A. Medications administered during study include No sleep medicine administered.[name]  SLEEP STUDY TECHNIQUE The patient underwent an attended overnight polysomnography titration to assess the effects of cpap therapy. The following variables were monitored: EEG(C4-A1, C3-A2, O1-A2, O2-A1), EOG, submental and leg EMG, ECG, oxyhemoglobin saturation by pulse oximetry, thoracic and abdominal respiratory effort belts, nasal/oral airflow by pressure sensor, body position sensor and snoring sensor. CPAP pressure was titrated to eliminate apneas, hypopneas and oxygen desaturation.  TECHNICAL COMMENTS Comments added by Technician: CPAP therapy started at 4 cm of H20. Patient tolerated CPAP fairly well. CPAP pressure increased to 12 cm of H20 until patient c/o pressure seemed to be too much, even on EPR of 2 level. Pressure decreased to 10 cm of H20 for duration of study, since patient could not tolerate pressure at 12 cm of H20. Suboptimal pressure obtained. EKG= Bradycardia Comments added by Scorer: N/A  SLEEP ARCHITECTURE The study was initiated at 10:49:33 PM and terminated at 5:43:30 AM. Total recorded time was 414 minutes. EEG confirmed total sleep time was 288.5 minutes yielding a sleep efficiency of 69.7%%. Sleep onset after lights out was 40.3 minutes with a REM latency of 128.5 minutes. The patient spent 14.9%% of the night in stage N1 sleep, 58.4%% in  stage N2 sleep, 23.6%% in stage N3 and 3.1% in REM. The Arousal Index was 20.4/hour.  RESPIRATORY PARAMETERS The overall AHI was 25.0 per hour, with a central apnea index of 0.0 per hour. Optimal pressure was not achieved. The sleep efficiency was 69.7% % and the patient was supine for 6.59%. The arousal index was 20.4 per hour.The oxygen nadir was 82.0% during sleep.  LEG MOVEMENT DATA The total leg movements were 0 with a resulting leg movement index of 0.0/hr. Associated arousal with leg movement index was 0.0/hr.  CARDIAC DATA The underlying cardiac rhythm was most consistent with sinus rhythm. Mean heart rate during sleep was 52.3 bpm. Additional rhythm abnormalities include None.  IMPRESSIONS - Inadequate CPAP titration. Patient expressed that he could not tolerate higher pressures. Highest EPR attempted was 2. BPAP was not attempted.  DIAGNOSIS - Obstructive Sleep Apnea (327.23 [G47.33 ICD-10])  RECOMMENDATIONS - BPAP titration recommended. Alternatively, a trial of Auto-BiPAP 6-18 cm H2O with pressure support of 4 cm H2O could be attempted. Patient did not tolerate CPAP.  Marius Ditch Sleep study,  [place] Board [employer]  ELECTRONICALLY SIGNED ON:  03/23/2020, 10:13 AM [place] PH: [phone]   FX: [phone] [place]

## 2020-03-25 ENCOUNTER — Other Ambulatory Visit: Payer: Self-pay

## 2020-03-25 ENCOUNTER — Ambulatory Visit (HOSPITAL_COMMUNITY)
Admission: RE | Admit: 2020-03-25 | Discharge: 2020-03-25 | Disposition: A | Discharge status: Home / Self Care | Payer: Medicare Other | Source: Ambulatory Visit | Attending: Emergency Medicine | Admitting: Emergency Medicine

## 2020-03-25 ENCOUNTER — Encounter (HOSPITAL_COMMUNITY): Payer: Medicare Other

## 2020-03-25 DIAGNOSIS — R911 Solitary pulmonary nodule: Secondary | ICD-10-CM | POA: Insufficient documentation

## 2020-03-25 MED ORDER — FLUDEOXYGLUCOSE F - 18 (FDG) INJECTION
15.1000 | Freq: Once | INTRAVENOUS | Status: AC | PRN
  Administered 2020-03-25: 15.1 via INTRAVENOUS

## 2020-03-27 ENCOUNTER — Encounter (HOSPITAL_COMMUNITY)
Admission: RE | Admit: 2020-03-27 | Discharge: 2020-03-27 | Disposition: A | Discharge status: Home / Self Care | Payer: Medicare Other | Source: Ambulatory Visit | Attending: Internal Medicine | Admitting: Internal Medicine

## 2020-03-27 ENCOUNTER — Other Ambulatory Visit: Payer: Self-pay

## 2020-03-27 DIAGNOSIS — Z955 Presence of coronary angioplasty implant and graft: Secondary | ICD-10-CM | POA: Diagnosis not present

## 2020-03-27 NOTE — Progress Notes (Signed)
Daily Session Note  Patient Details  Name: Daniel Ball MRN: [id number] Date of Birth: [birth year] Referring Provider:     [place] from 03/06/2020 in [place]  Referring Provider Dr. Harrington Challenger      Encounter Date: 03/27/2020  Check In:  Session Check In - 03/27/20 0812      Check-In   Supervising physician immediately available to respond to emergencies See telemetry face sheet for immediately available MD    Location [facility]    Staff Present [name], Exercise Physiologist;Kelon Easom Kris Mouton, MS, ACSM-CEP, Exercise Physiologist;Debra Wynetta Emery, RN, BSN    Virtual Visit No    Medication changes reported     No    Fall or balance concerns reported    No    Tobacco Cessation No Change    Current number of cigarettes/nicotine per day     10    Warm-up and Cool-down Performed as group-led instruction    Resistance Training Performed Yes    VAD Patient? No    PAD/SET Patient? No      Pain Assessment   Currently in Pain? No/denies    Pain Score 0-No pain    Multiple Pain Sites No      2nd Pain Site   Pain Score 0           Capillary Blood Glucose: No results found for this or any previous visit (from the past 24 hour(s)).    Social History   Tobacco Use  Smoking Status Current Every Day Smoker  . Packs/day: 0.50  . Years: 54.00  . Pack years: 27.00  . Types: Cigarettes  Smokeless Tobacco Never Used  Tobacco Comment   10 cigarettes a day 02/28/20 ARJ    Goals Met:  Independence with exercise equipment Exercise tolerated well No report of cardiac concerns or symptoms Strength training completed today  Goals Unmet:  Not Applicable  Comments: checkout time is 0915   Dr. Kate Sable is Medical Director for [facility].

## 2020-03-28 ENCOUNTER — Telehealth: Payer: Self-pay | Admitting: Emergency Medicine

## 2020-03-28 DIAGNOSIS — R911 Solitary pulmonary nodule: Secondary | ICD-10-CM

## 2020-03-28 NOTE — Telephone Encounter (Signed)
Patient calling for results of PET scan done on 03/25/2020. Please advise.

## 2020-03-29 ENCOUNTER — Encounter (HOSPITAL_COMMUNITY)
Admission: RE | Admit: 2020-03-29 | Discharge: 2020-03-29 | Disposition: A | Discharge status: Home / Self Care | Payer: Medicare Other | Source: Ambulatory Visit | Attending: Internal Medicine | Admitting: Internal Medicine

## 2020-03-29 ENCOUNTER — Other Ambulatory Visit: Payer: Self-pay

## 2020-03-29 DIAGNOSIS — Z955 Presence of coronary angioplasty implant and graft: Secondary | ICD-10-CM | POA: Diagnosis not present

## 2020-03-29 NOTE — Progress Notes (Signed)
Daily Session Note  Patient Details  Name: Daniel Ball MRN: [id number] Date of Birth: 1956-09-07 Referring Provider:     [place] from 03/06/2020 in [place]  Referring Provider Dr. Harrington Challenger      Encounter Date: 03/29/2020  Check In:   Capillary Blood Glucose: No results found for this or any previous visit (from the past 24 hour(s)).    Social History   Tobacco Use  Smoking Status Current Every Day Smoker   Packs/day: 0.50   Years: 54.00   Pack years: 27.00   Types: Cigarettes  Smokeless Tobacco Never Used  Tobacco Comment   10 cigarettes a day 02/28/20 ARJ    Goals Met:  Independence with exercise equipment Exercise tolerated well No report of cardiac concerns or symptoms Strength training completed today  Goals Unmet:  Not Applicable  Comments: checkout time is 0915   Dr. Kathie Dike is Medical Director for [facility].

## 2020-03-29 NOTE — Telephone Encounter (Signed)
Pt calling back about this, please return call.  

## 2020-03-29 NOTE — Telephone Encounter (Addendum)
Called and spoke with pt letting him know that [facility] has not been able to review the PET scan yet. Stated to him that we will call him with the results once reviewed by RB and he verbalized understanding. Will await results from [place].

## 2020-04-01 ENCOUNTER — Other Ambulatory Visit: Payer: Self-pay

## 2020-04-01 ENCOUNTER — Encounter (HOSPITAL_COMMUNITY)
Admission: RE | Admit: 2020-04-01 | Discharge: 2020-04-01 | Disposition: A | Discharge status: Home / Self Care | Payer: Medicare Other | Source: Ambulatory Visit | Attending: Internal Medicine | Admitting: Internal Medicine

## 2020-04-01 VITALS — Wt 251.8 lb

## 2020-04-01 DIAGNOSIS — Z955 Presence of coronary angioplasty implant and graft: Secondary | ICD-10-CM

## 2020-04-01 NOTE — Telephone Encounter (Signed)
Spoke with the patient today regarding his PET scan.  The right upper lobe pulmonary nodule was hypermetabolic.  He has mediastinal adenopathy but there is no significant hypermetabolism present.  We discussed the pros and cons of a tissue diagnosis versus referral to surgery.  Given the mediastinal adenopathy and his marginal pulmonary function I think bronchoscopy makes the most sense.  We will perform navigational bronchoscopy to approach the apical right nodule and also EBUS to evaluate the mediastinum.  Depending on the results we will decide whether referral to thoracic surgery versus oncology/radiation oncology makes the most sense.

## 2020-04-01 NOTE — Progress Notes (Signed)
Daily Session Note  Patient Details  Name: Daniel Ball MRN: [id number] Date of Birth: [birth year] Referring Provider:     CARDIAC REHAB PHASE II ORIENTATION from 03/06/2020 in [place]  Referring Provider Dr. Harrington Challenger      Encounter Date: 04/01/2020  Check In:  Session Check In - 04/01/20 0814      Check-In   Supervising physician immediately available to respond to emergencies See telemetry face sheet for immediately available MD    Location [facility]    Staff Present [name], Exercise Physiologist;Mylin Gignac Rosezella [place], RN, Madelaine Bhat, RN, BSN    Virtual Visit No    Medication changes reported     No    Fall or balance concerns reported    No    Tobacco Cessation No Change    Warm-up and Cool-down Performed as group-led instruction    Resistance Training Performed Yes    VAD Patient? No    PAD/SET Patient? No      Pain Assessment   Currently in Pain? No/denies    Pain Score 0-No pain           Capillary Blood Glucose: No results found for this or any previous visit (from the past 24 hour(s)).    Social History   Tobacco Use  Smoking Status Current Every Day Smoker  . Packs/day: 0.50  . Years: 54.00  . Pack years: 27.00  . Types: Cigarettes  Smokeless Tobacco Never Used  Tobacco Comment   10 cigarettes a day 02/28/20 ARJ    Goals Met:  Independence with exercise equipment Exercise tolerated well No report of cardiac concerns or symptoms Strength training completed today  Goals Unmet:  Not Applicable  Comments:session 8:15-9:15   Dr. Kathie Dike is Medical Director for [facility].

## 2020-04-02 ENCOUNTER — Telehealth: Payer: Self-pay | Admitting: Emergency Medicine

## 2020-04-02 NOTE — Telephone Encounter (Signed)
ENB/EBUS scheduled for 7/20 @ 9:30 AM COVID Test scheduled for 7/17 @ 11:30 AM  Pt aware of appointments; to arrive by 7 AM for ENB/EBUS; to stop Eliquis 3 days prior & plavix 5 days prior.   Requested 6/24 CT to be converted to a SuperD at [facility].  Michela Pitcher they will leave it in a basket for RB to pick up.

## 2020-04-02 NOTE — Telephone Encounter (Signed)
Noted  

## 2020-04-03 ENCOUNTER — Encounter (HOSPITAL_COMMUNITY): Payer: Medicare Other

## 2020-04-03 DIAGNOSIS — N400 Enlarged prostate without lower urinary tract symptoms: Secondary | ICD-10-CM | POA: Diagnosis not present

## 2020-04-03 DIAGNOSIS — F17200 Nicotine dependence, unspecified, uncomplicated: Secondary | ICD-10-CM | POA: Diagnosis not present

## 2020-04-03 DIAGNOSIS — Z0000 Encounter for general adult medical examination without abnormal findings: Secondary | ICD-10-CM | POA: Diagnosis not present

## 2020-04-03 DIAGNOSIS — D6869 Other thrombophilia: Secondary | ICD-10-CM | POA: Diagnosis not present

## 2020-04-03 DIAGNOSIS — R7303 Prediabetes: Secondary | ICD-10-CM | POA: Diagnosis not present

## 2020-04-03 DIAGNOSIS — E782 Mixed hyperlipidemia: Secondary | ICD-10-CM | POA: Diagnosis not present

## 2020-04-03 DIAGNOSIS — Z125 Encounter for screening for malignant neoplasm of prostate: Secondary | ICD-10-CM | POA: Diagnosis not present

## 2020-04-03 DIAGNOSIS — I25118 Atherosclerotic heart disease of native coronary artery with other forms of angina pectoris: Secondary | ICD-10-CM | POA: Diagnosis not present

## 2020-04-03 DIAGNOSIS — I10 Essential (primary) hypertension: Secondary | ICD-10-CM | POA: Diagnosis not present

## 2020-04-03 DIAGNOSIS — Z23 Encounter for immunization: Secondary | ICD-10-CM | POA: Diagnosis not present

## 2020-04-03 DIAGNOSIS — J449 Chronic obstructive pulmonary disease, unspecified: Secondary | ICD-10-CM | POA: Diagnosis not present

## 2020-04-03 DIAGNOSIS — I480 Paroxysmal atrial fibrillation: Secondary | ICD-10-CM | POA: Diagnosis not present

## 2020-04-03 NOTE — Telephone Encounter (Signed)
Thank you :)

## 2020-04-04 ENCOUNTER — Other Ambulatory Visit: Payer: Self-pay

## 2020-04-04 MED ORDER — METOPROLOL SUCCINATE ER 25 MG PO TB24: 25.0000 mg | ORAL_TABLET | Freq: Every day | ORAL | 1 refills | Status: DC

## 2020-04-04 NOTE — Telephone Encounter (Signed)
This pt is seen in [place] by Dr. Harrington Challenger. Please refill this medication. Thanks

## 2020-04-04 NOTE — Telephone Encounter (Signed)
Pt's pharmacy is requesting a 90 day supply. This is a [place] pt. Please address

## 2020-04-05 ENCOUNTER — Other Ambulatory Visit: Payer: Self-pay

## 2020-04-05 ENCOUNTER — Encounter (HOSPITAL_COMMUNITY)
Admission: RE | Admit: 2020-04-05 | Discharge: 2020-04-05 | Disposition: A | Discharge status: Home / Self Care | Payer: Medicare Other | Source: Ambulatory Visit | Attending: Internal Medicine | Admitting: Internal Medicine

## 2020-04-05 DIAGNOSIS — Z955 Presence of coronary angioplasty implant and graft: Secondary | ICD-10-CM

## 2020-04-05 NOTE — Progress Notes (Signed)
Daily Session Note  Patient Details  Name: Daniel Ball MRN: [id number] Date of Birth: [birth year] Referring Provider:     [place] from 03/06/2020 in [place]  Referring Provider Dr. Harrington Challenger      Encounter Date: 04/05/2020  Check In:  Session Check In - 04/05/20 0822      Check-In   Supervising physician immediately available to respond to emergencies See telemetry face sheet for immediately available MD    Location [facility]    Staff Present [name], Exercise Physiologist;[place] Kris Mouton, MS, ACSM-CEP, Exercise Physiologist;Carlette Wilber Oliphant, RN, BSN    Virtual Visit No    Medication changes reported     No    Tobacco Cessation No Change    Warm-up and Cool-down Performed as group-led instruction    Resistance Training Performed Yes    VAD Patient? No    PAD/SET Patient? No      Pain Assessment   Currently in Pain? No/denies    Pain Score 0-No pain    Multiple Pain Sites No           Capillary Blood Glucose: No results found for this or any previous visit (from the past 24 hour(s)).    Social History   Tobacco Use  Smoking Status Current Every Day Smoker  . Packs/day: 0.50  . Years: 54.00  . Pack years: 27.00  . Types: Cigarettes  Smokeless Tobacco Never Used  Tobacco Comment   10 cigarettes a day 02/28/20 ARJ    Goals Met:  Independence with exercise equipment Exercise tolerated well No report of cardiac concerns or symptoms Strength training completed today  Goals Unmet:  Not Applicable  Comments: Check out: 915   Dr. Kathie Dike is Medical Director for [facility].

## 2020-04-06 ENCOUNTER — Other Ambulatory Visit (HOSPITAL_COMMUNITY)
Admission: RE | Admit: 2020-04-06 | Discharge: 2020-04-06 | Disposition: A | Discharge status: Home / Self Care | Payer: Medicare Other | Source: Ambulatory Visit | Attending: Emergency Medicine | Admitting: Emergency Medicine

## 2020-04-06 DIAGNOSIS — Z20822 Contact with and (suspected) exposure to covid-19: Secondary | ICD-10-CM | POA: Insufficient documentation

## 2020-04-06 DIAGNOSIS — Z01812 Encounter for preprocedural laboratory examination: Secondary | ICD-10-CM | POA: Diagnosis not present

## 2020-04-06 LAB — SARS CORONAVIRUS 2 (TAT 6-24 HRS): SARS Coronavirus 2: NEGATIVE

## 2020-04-08 ENCOUNTER — Other Ambulatory Visit: Payer: Self-pay

## 2020-04-08 ENCOUNTER — Encounter (HOSPITAL_COMMUNITY): Payer: Medicare Other

## 2020-04-08 ENCOUNTER — Telehealth: Payer: Self-pay | Admitting: Emergency Medicine

## 2020-04-08 ENCOUNTER — Encounter (HOSPITAL_COMMUNITY): Payer: Self-pay | Admitting: Emergency Medicine

## 2020-04-08 NOTE — Telephone Encounter (Signed)
Thank you I spoke with anesthesia about the plan

## 2020-04-08 NOTE — Anesthesia Preprocedure Evaluation (Addendum)
Anesthesia Evaluation  Patient identified by MRN, date of birth, ID band Patient awake    Reviewed: Allergy & Precautions, H&P , NPO status , Patient's Chart, lab work & pertinent test results, reviewed documented beta blocker date and time   Airway Mallampati: III  TM Distance: >3 FB Neck ROM: Full    Dental no notable dental hx. (+) Edentulous Upper, Edentulous Lower, Dental Advisory Given   Pulmonary shortness of breath and with exertion, sleep apnea , COPD,  COPD inhaler, Current SmokerPatient did not abstain from smoking.,    Pulmonary exam normal breath sounds clear to auscultation       Cardiovascular hypertension, Pt. on medications and Pt. on home beta blockers + CAD and + Cardiac Stents   Rhythm:Regular Rate:Normal     Neuro/Psych negative neurological ROS  negative psych ROS   GI/Hepatic Neg liver ROS, GERD  Medicated and Controlled,  Endo/Other  Morbid obesity  Renal/GU negative Renal ROS  negative genitourinary   Musculoskeletal  (+) Arthritis , Osteoarthritis,    Abdominal   Peds  Hematology negative hematology ROS (+)   Anesthesia Other Findings   Reproductive/Obstetrics negative OB ROS                           Anesthesia Physical Anesthesia Plan  ASA: III  Anesthesia Plan: General   Post-op Pain Management:    Induction: Intravenous  PONV Risk Score and Plan: 2 and Ondansetron, Dexamethasone and Midazolam  Airway Management Planned: Oral ETT  Additional Equipment:   Intra-op Plan:   Post-operative Plan: Extubation in OR  Informed Consent: I have reviewed the patients History and Physical, chart, labs and discussed the procedure including the risks, benefits and alternatives for the proposed anesthesia with the patient or authorized representative who has indicated his/her understanding and acceptance.     Dental advisory given  Plan Discussed with:  CRNA  Anesthesia Plan Comments: (PAT note by Karoline Caldwell, PA-C: Follows with cardiology for history of CAD status post PCI with DES to the LAD 01/18/2020.  He continues to have shortness of breath and is being worked up by pulmonary.  Per cardiology note 02/02/2020, "catheterization in 10/2019 showed 2-vessel CADwith 60% proximal LAD stenosisandmultifocal RCA disease with a long segment of 40% proximal stenosis and irregular distal lesion of up to 70-80%which were not significant by FFR and medical management was recommended and consideration of atherectomyof LAD if refractory symptoms. Underwent successful orbital atherectomy and IVUS guided PCI to the proximal/mid LAD using a Synergy 3.5 x 28 mm drug-eluting stent on 01/18/2020. - He did not experience significant improvement in his breathing following stent placement. Suspect a pulmonary etiology of his dyspnea. Low-suspicion for a PE given appropriate oxygen saturations and the fact that he is on anticoagulation. - he will remain on Plavix and Eliquis for at least 6 months then can switch to ASA and Eliquis as outlined in his cath report. Continue BB and statin therapy. Will discontinue Imdur as he did not experience improvement in his symptoms with this. "  Dr. Lamonte Sakai has directed patient to stop to stop Eliquis 3 days prior & plavix 5 days prior to surgery.  Patient is currently participating in a cardiac rehabilitation exercise program.  Patient has OSA and recently underwent CPAP titration 03/10/2020 but did not tolerate device.  Will need DOS labs and eval.   PFT 08/22/18: FVC-%Pred-Pre Latest Units: % 79 FEV1-%Pred-Pre Latest Units: % 80 FEV1FVC-%Pred-Pre Latest Units: %  100 TLC % pred Latest Units: % 89 RV % pred Latest Units: % 110 DLCO unc % pred Latest Units: % 60   CT chest 03/04/20: IMPRESSION: 1. Irregular 9 mm solid peripheral apical right upper lobe pulmonary nodule, increased in size, worrisome for primary  bronchogenic carcinoma. PET-CT suggested for further characterization. 2. Spectrum of findings compatible with fibrotic interstitial lung disease without frank honeycombing and without a clear apicobasilar gradient. No appreciable interval progression. Mild progression since the baseline 2017 high-resolution chest CT study. Findings are categorized as probable UIP per consensus guidelines: Diagnosis of Idiopathic Pulmonary Fibrosis: An Official ATS/ERS/JRS/ALAT Clinical Practice Guideline. [place], Iss 5, [id number], May 22 2017. 3. Three-vessel coronary atherosclerosis. 4. Aortic Atherosclerosis (ICD10-I70.0) and Emphysema (ICD10-J43.9).  Cath and PCI 01/18/20: Multivessel coronary artery disease, including 80% proximal LAD stenosis previously shown to be hemodynamically significant by DFR.  There is also moderate disease involving the distal LMCA/ostial LAD, mid/distal LAD, and OM1/OM2. Mildly elevated left ventricular filling pressure. Successful orbital atherectomy and IVUS guided PCI to the proximal/mid LAD using a Synergy 3.5 x 28 mm drug-eluting stent (postdilated to 4.1 mm) with 0% residual stenosis and TIMI-3 flow.  Recommendations: Overnight extended recovery. If no evidence of bleeding/vascular complications, anticipate restarting apixaban tomorrow.  Would continue apixaban and clopidogrel for at least 6 months, after which time patient could be transitioned to apixaban and aspirin 81 mg daily. Aggressive secondary prevention.  Fasting lipid panel to be checked with morning labs to see if rosuvastatin needs to be increased.  TTE 11/17/19: 1. Left ventricular ejection fraction, by estimation, is 60 to 65%. The  left ventricle has normal function. The left ventricle has no regional  wall motion abnormalities. There is mild concentric left ventricular  hypertrophy. Left ventricular diastolic  parameters are indeterminate.  2. Right ventricular systolic  function is normal. The right ventricular  size is normal.  3. The mitral valve is grossly normal. Trivial mitral valve  regurgitation.  4. The aortic valve is tricuspid. Aortic valve regurgitation is not  visualized. Mild aortic valve sclerosis is present, with no evidence of  aortic valve stenosis.  5. The inferior vena cava is normal in size with greater than 50%  respiratory variability, suggesting right atrial pressure of 3 mmHg.   )       Anesthesia Quick Evaluation

## 2020-04-08 NOTE — Progress Notes (Signed)
Anesthesia Chart Review:  Follows with cardiology for history of CAD status post PCI with DES to the LAD 01/18/2020.  He continues to have shortness of breath and is being worked up by pulmonary.  Per cardiology note 02/02/2020, "catheterization in 10/2019 showed 2-vessel CADwith 60% proximal LAD stenosisandmultifocal RCA disease with a long segment of 40% proximal stenosis and irregular distal lesion of up to 70-80%which were not significant by FFR and medical management was recommended and consideration of atherectomyof LAD if refractory symptoms. Underwent successful orbital atherectomy and IVUS guided PCI to the proximal/mid LAD using a Synergy 3.5 x 28 mm drug-eluting stent on 01/18/2020. - He did not experience significant improvement in his breathing following stent placement. Suspect a pulmonary etiology of his dyspnea. Low-suspicion for a PE given appropriate oxygen saturations and the fact that he is on anticoagulation. - he will remain on Plavix and Eliquis for at least 6 months then can switch to ASA and Eliquis as outlined in his cath report. Continue BB and statin therapy. Will discontinue Imdur as he did not experience improvement in his symptoms with this. "  Dr. Lamonte Sakai has directed patient to stop to stop Eliquis 3 days prior & plavix 5 days prior to surgery.  Patient is currently participating in a cardiac rehabilitation exercise program.  Patient has OSA and recently underwent CPAP titration 03/10/2020 but did not tolerate device.  Will need DOS labs and eval.   PFT 08/22/18: FVC-%Pred-Pre Latest Units: % 79  FEV1-%Pred-Pre Latest Units: % 80  FEV1FVC-%Pred-Pre Latest Units: % 100  TLC % pred Latest Units: % 89  RV % pred Latest Units: % 110  DLCO unc % pred Latest Units: % 60    CT chest 03/04/20: IMPRESSION: 1. Irregular 9 mm solid peripheral apical right upper lobe pulmonary nodule, increased in size, worrisome for primary bronchogenic carcinoma. PET-CT suggested for  further characterization. 2. Spectrum of findings compatible with fibrotic interstitial lung disease without frank honeycombing and without a clear apicobasilar gradient. No appreciable interval progression. Mild progression since the baseline 2017 high-resolution chest CT study. Findings are categorized as probable UIP per consensus guidelines: Diagnosis of Idiopathic Pulmonary Fibrosis: An Official ATS/ERS/JRS/ALAT Clinical Practice Guideline. [place], Iss 5, [phone], May 22 2017. 3. Three-vessel coronary atherosclerosis. 4. Aortic Atherosclerosis (ICD10-I70.0) and Emphysema (ICD10-J43.9).  Cath and PCI 01/18/20: 1. Multivessel coronary artery disease, including 80% proximal LAD stenosis previously shown to be hemodynamically significant by DFR.  There is also moderate disease involving the distal LMCA/ostial LAD, mid/distal LAD, and OM1/OM2. 2. Mildly elevated left ventricular filling pressure. 3. Successful orbital atherectomy and IVUS guided PCI to the proximal/mid LAD using a Synergy 3.5 x 28 mm drug-eluting stent (postdilated to 4.1 mm) with 0% residual stenosis and TIMI-3 flow.  Recommendations: 1. Overnight extended recovery. 2. If no evidence of bleeding/vascular complications, anticipate restarting apixaban tomorrow.  Would continue apixaban and clopidogrel for at least 6 months, after which time patient could be transitioned to apixaban and aspirin 81 mg daily. 3. Aggressive secondary prevention.  Fasting lipid panel to be checked with morning labs to see if rosuvastatin needs to be increased.  TTE 11/17/19: 1. Left ventricular ejection fraction, by estimation, is 60 to 65%. The  left ventricle has normal function. The left ventricle has no regional  wall motion abnormalities. There is mild concentric left ventricular  hypertrophy. Left ventricular diastolic  parameters are indeterminate.  2. Right ventricular systolic function is normal. The right  ventricular  size is normal.  3. The mitral valve is grossly normal. Trivial mitral valve  regurgitation.  4. The aortic valve is tricuspid. Aortic valve regurgitation is not  visualized. Mild aortic valve sclerosis is present, with no evidence of  aortic valve stenosis.  5. The inferior vena cava is normal in size with greater than 50%  respiratory variability, suggesting right atrial pressure of 3 mmHg.   Wynonia Musty [facility] Short Stay Center/Anesthesiology Phone [phone] 04/08/2020 12:16 PM

## 2020-04-08 NOTE — Progress Notes (Signed)
Spoke with pt for pre-op call. Pt has hx of CAD with stent in April, 2021. Dr. Harrington Challenger is pt's cardiologist. He denies any recent chest pain. Pt has sob due to lung issues. Pt is on Plavix and Eliquis post stent placement. Last dose of Plavix was 04/04/20 and last dose of Eliquis was 04/06/20 pm dose. Pt states he is Pre-diabetic. States Dr. [name] has done an A1C recently but he has not received the results. Have requested the result from Dr. Orest Dikes office.   Covid test done 04/06/20 and it's negative, pt states he's been in quarantine since the test was done and understands that he stays in quarantine until he comes to the hospital tomorrow.  Chart sent for review by Anesthesia PA.

## 2020-04-08 NOTE — Telephone Encounter (Signed)
Daniel Ball D     11:54 AM Note ENB/EBUS scheduled for 7/20 @ 9:30 AM COVID Test scheduled for 7/17 @ 11:30 AM  Pt aware of appointments; to arrive by 7 AM for ENB/EBUS; to stop Eliquis 3 days prior & plavix 5 days prior.        Called [facility] and spoke with Ebony Hail. She wanted to know if cardiology signed off on pt stopping the blood thinners due to drug eluding stent placed 01/18/20. I made her aware that pt's Eliquis and Plavix had to be stopped in order for pt to have ENB/EBUS performed by [facility] but she stated that she wanted to check to make sure cardiology was aware.  Dr. Lamonte Sakai, please advise.

## 2020-04-09 ENCOUNTER — Ambulatory Visit (HOSPITAL_COMMUNITY)
Admission: RE | Admit: 2020-04-09 | Discharge: 2020-04-09 | Disposition: A | Discharge status: Home / Self Care | Payer: Medicare Other | Attending: Emergency Medicine | Admitting: Emergency Medicine

## 2020-04-09 ENCOUNTER — Other Ambulatory Visit: Payer: Self-pay

## 2020-04-09 ENCOUNTER — Ambulatory Visit (HOSPITAL_COMMUNITY): Payer: Medicare Other

## 2020-04-09 ENCOUNTER — Ambulatory Visit (HOSPITAL_COMMUNITY): Payer: Medicare Other | Admitting: Physician Assistant

## 2020-04-09 ENCOUNTER — Encounter: Payer: Self-pay | Admitting: Emergency Medicine

## 2020-04-09 ENCOUNTER — Encounter (HOSPITAL_COMMUNITY)
Admission: RE | Disposition: A | Discharge status: Home / Self Care | Payer: Self-pay | Source: Home / Self Care | Attending: Emergency Medicine

## 2020-04-09 ENCOUNTER — Encounter (HOSPITAL_COMMUNITY): Payer: Self-pay | Admitting: Emergency Medicine

## 2020-04-09 DIAGNOSIS — I517 Cardiomegaly: Secondary | ICD-10-CM | POA: Diagnosis not present

## 2020-04-09 DIAGNOSIS — R911 Solitary pulmonary nodule: Secondary | ICD-10-CM | POA: Diagnosis not present

## 2020-04-09 DIAGNOSIS — M1990 Unspecified osteoarthritis, unspecified site: Secondary | ICD-10-CM | POA: Insufficient documentation

## 2020-04-09 DIAGNOSIS — Z955 Presence of coronary angioplasty implant and graft: Secondary | ICD-10-CM | POA: Diagnosis not present

## 2020-04-09 DIAGNOSIS — E785 Hyperlipidemia, unspecified: Secondary | ICD-10-CM | POA: Diagnosis not present

## 2020-04-09 DIAGNOSIS — Z79899 Other long term (current) drug therapy: Secondary | ICD-10-CM | POA: Insufficient documentation

## 2020-04-09 DIAGNOSIS — Z7901 Long term (current) use of anticoagulants: Secondary | ICD-10-CM | POA: Diagnosis not present

## 2020-04-09 DIAGNOSIS — J8489 Other specified interstitial pulmonary diseases: Secondary | ICD-10-CM

## 2020-04-09 DIAGNOSIS — I2510 Atherosclerotic heart disease of native coronary artery without angina pectoris: Secondary | ICD-10-CM | POA: Diagnosis not present

## 2020-04-09 DIAGNOSIS — R7303 Prediabetes: Secondary | ICD-10-CM | POA: Diagnosis not present

## 2020-04-09 DIAGNOSIS — I10 Essential (primary) hypertension: Secondary | ICD-10-CM | POA: Diagnosis not present

## 2020-04-09 DIAGNOSIS — I119 Hypertensive heart disease without heart failure: Secondary | ICD-10-CM | POA: Diagnosis not present

## 2020-04-09 DIAGNOSIS — G4733 Obstructive sleep apnea (adult) (pediatric): Secondary | ICD-10-CM | POA: Diagnosis not present

## 2020-04-09 DIAGNOSIS — I4891 Unspecified atrial fibrillation: Secondary | ICD-10-CM | POA: Insufficient documentation

## 2020-04-09 DIAGNOSIS — R590 Localized enlarged lymph nodes: Secondary | ICD-10-CM

## 2020-04-09 DIAGNOSIS — J449 Chronic obstructive pulmonary disease, unspecified: Secondary | ICD-10-CM | POA: Insufficient documentation

## 2020-04-09 DIAGNOSIS — K219 Gastro-esophageal reflux disease without esophagitis: Secondary | ICD-10-CM | POA: Diagnosis not present

## 2020-04-09 DIAGNOSIS — F17210 Nicotine dependence, cigarettes, uncomplicated: Secondary | ICD-10-CM | POA: Insufficient documentation

## 2020-04-09 DIAGNOSIS — Z9889 Other specified postprocedural states: Secondary | ICD-10-CM

## 2020-04-09 DIAGNOSIS — C3490 Malignant neoplasm of unspecified part of unspecified bronchus or lung: Secondary | ICD-10-CM

## 2020-04-09 HISTORY — PX: BRONCHIAL WASHINGS: SHX5105

## 2020-04-09 HISTORY — PX: VIDEO BRONCHOSCOPY WITH ENDOBRONCHIAL NAVIGATION: SHX6175

## 2020-04-09 HISTORY — PX: BRONCHIAL NEEDLE ASPIRATION BIOPSY: SHX5106

## 2020-04-09 HISTORY — PX: BRONCHIAL BIOPSY: SHX5109

## 2020-04-09 HISTORY — PX: FINE NEEDLE ASPIRATION: SHX5430

## 2020-04-09 HISTORY — PX: VIDEO BRONCHOSCOPY WITH RADIAL ENDOBRONCHIAL ULTRASOUND: SHX6849

## 2020-04-09 HISTORY — PX: FIDUCIAL MARKER PLACEMENT: SHX6858

## 2020-04-09 HISTORY — DX: Unspecified atrial fibrillation: I48.91

## 2020-04-09 HISTORY — PX: VIDEO BRONCHOSCOPY WITH ENDOBRONCHIAL ULTRASOUND: SHX6177

## 2020-04-09 HISTORY — PX: BRONCHIAL BRUSHINGS: SHX5108

## 2020-04-09 LAB — CBC
HCT: 44.7 % (ref 39.0–52.0)
Hemoglobin: 14.7 g/dL (ref 13.0–17.0)
MCH: 31 pg (ref 26.0–34.0)
MCHC: 32.9 g/dL (ref 30.0–36.0)
MCV: 94.3 fL (ref 80.0–100.0)
Platelets: 133 10*3/uL — ABNORMAL LOW (ref 150–400)
RBC: 4.74 MIL/uL (ref 4.22–5.81)
RDW: 12.8 % (ref 11.5–15.5)
WBC: 6.5 10*3/uL (ref 4.0–10.5)
nRBC: 0 % (ref 0.0–0.2)

## 2020-04-09 LAB — COMPREHENSIVE METABOLIC PANEL
ALT: 30 U/L (ref 0–44)
AST: 24 U/L (ref 15–41)
Albumin: 3.8 g/dL (ref 3.5–5.0)
Alkaline Phosphatase: 63 U/L (ref 38–126)
Anion gap: 10 (ref 5–15)
BUN: 17 mg/dL (ref 8–23)
CO2: 23 mmol/L (ref 22–32)
Calcium: 9.1 mg/dL (ref 8.9–10.3)
Chloride: 105 mmol/L (ref 98–111)
Creatinine, Ser: 1.05 mg/dL (ref 0.61–1.24)
GFR calc Af Amer: >60 mL/min (ref >60)
GFR calc non Af Amer: >60 mL/min (ref >60)
Glucose, Bld: 138 mg/dL — ABNORMAL HIGH (ref 70–99)
Potassium: 4.3 mmol/L (ref 3.5–5.1)
Sodium: 138 mmol/L (ref 135–145)
Total Bilirubin: 0.7 mg/dL (ref 0.3–1.2)
Total Protein: 7 g/dL (ref 6.5–8.1)

## 2020-04-09 LAB — PROTIME-INR
INR: 1 (ref 0.8–1.2)
Prothrombin Time: 12.6 seconds (ref 11.4–15.2)

## 2020-04-09 LAB — GLUCOSE, CAPILLARY
Glucose-Capillary: 143 mg/dL — ABNORMAL HIGH (ref 70–99)
Glucose-Capillary: 128 mg/dL — ABNORMAL HIGH (ref 70–99)
Glucose-Capillary: 128 mg/dL — ABNORMAL HIGH (ref 70–99)

## 2020-04-09 LAB — APTT: aPTT: 30 seconds (ref 24–36)

## 2020-04-09 SURGERY — VIDEO BRONCHOSCOPY WITH ENDOBRONCHIAL NAVIGATION
Anesthesia: General

## 2020-04-09 MED ORDER — LACTATED RINGERS IV SOLN: INTRAVENOUS | Status: DC

## 2020-04-09 MED ORDER — METOPROLOL SUCCINATE ER 25 MG PO TB24: 25.0000 mg | ORAL_TABLET | Freq: Every day | ORAL | Status: DC

## 2020-04-09 MED ORDER — ROCURONIUM BROMIDE 10 MG/ML (PF) SYRINGE
PREFILLED_SYRINGE | INTRAVENOUS | Status: DC | PRN
  Administered 2020-04-09: 60 mg via INTRAVENOUS
  Administered 2020-04-09: 20 mg via INTRAVENOUS

## 2020-04-09 MED ORDER — METOPROLOL SUCCINATE ER 25 MG PO TB24
ORAL_TABLET | ORAL | Status: AC
  Filled 2020-04-09: qty 1

## 2020-04-09 MED ORDER — CLOPIDOGREL BISULFATE 75 MG PO TABS: 75.0000 mg | ORAL_TABLET | Freq: Every day | ORAL | 3 refills | Status: DC

## 2020-04-09 MED ORDER — PHENYLEPHRINE HCL-NACL 10-0.9 MG/250ML-% IV SOLN
INTRAVENOUS | Status: DC | PRN
  Administered 2020-04-09: 50 ug/min via INTRAVENOUS

## 2020-04-09 MED ORDER — GLYCOPYRROLATE 0.2 MG/ML IJ SOLN
INTRAMUSCULAR | Status: DC | PRN
Start: 2020-04-09 — End: 2020-04-09
  Administered 2020-04-09: .2 mg via INTRAVENOUS

## 2020-04-09 MED ORDER — EPHEDRINE SULFATE 50 MG/ML IJ SOLN
INTRAMUSCULAR | Status: DC | PRN
Start: 2020-04-09 — End: 2020-04-09
  Administered 2020-04-09 (×3): 10 mg via INTRAVENOUS

## 2020-04-09 MED ORDER — ACETAMINOPHEN 500 MG PO TABS
ORAL_TABLET | ORAL | Status: AC
  Administered 2020-04-09: 1000 mg via ORAL
  Filled 2020-04-09: qty 2

## 2020-04-09 MED ORDER — PROPOFOL 10 MG/ML IV BOLUS
INTRAVENOUS | Status: DC | PRN
  Administered 2020-04-09: 150 mg via INTRAVENOUS
  Administered 2020-04-09: 40 mg via INTRAVENOUS
  Administered 2020-04-09: 50 mg via INTRAVENOUS

## 2020-04-09 MED ORDER — CHLORHEXIDINE GLUCONATE 0.12 % MT SOLN: 15.0000 mL | Freq: Once | OROMUCOSAL | Status: AC

## 2020-04-09 MED ORDER — FENTANYL CITRATE (PF) 100 MCG/2ML IJ SOLN
INTRAMUSCULAR | Status: DC | PRN
  Administered 2020-04-09: 100 ug via INTRAVENOUS

## 2020-04-09 MED ORDER — APIXABAN 5 MG PO TABS: 5.0000 mg | ORAL_TABLET | Freq: Two times a day (BID) | ORAL | 5 refills | Status: DC

## 2020-04-09 MED ORDER — METOPROLOL SUCCINATE ER 25 MG PO TB24
25.0000 mg | ORAL_TABLET | Freq: Once | ORAL | Status: AC
  Administered 2020-04-09: 25 mg via ORAL

## 2020-04-09 MED ORDER — LACTATED RINGERS IV SOLN: INTRAVENOUS | Status: DC | PRN

## 2020-04-09 MED ORDER — CHLORHEXIDINE GLUCONATE 0.12 % MT SOLN
OROMUCOSAL | Status: AC
  Administered 2020-04-09: 15 mL via OROMUCOSAL
  Filled 2020-04-09: qty 15

## 2020-04-09 MED ORDER — SUCCINYLCHOLINE CHLORIDE 20 MG/ML IJ SOLN
INTRAMUSCULAR | Status: DC | PRN
Start: 2020-04-09 — End: 2020-04-09
  Administered 2020-04-09: 120 mg via INTRAVENOUS

## 2020-04-09 MED ORDER — SUGAMMADEX SODIUM 200 MG/2ML IV SOLN
INTRAVENOUS | Status: DC | PRN
  Administered 2020-04-09: 224 mg via INTRAVENOUS

## 2020-04-09 MED ORDER — LIDOCAINE 2% (20 MG/ML) 5 ML SYRINGE
INTRAMUSCULAR | Status: DC | PRN
  Administered 2020-04-09: 60 mg via INTRAVENOUS

## 2020-04-09 MED ORDER — PHENYLEPHRINE HCL (PRESSORS) 10 MG/ML IV SOLN
INTRAVENOUS | Status: DC | PRN
Start: 2020-04-09 — End: 2020-04-09
  Administered 2020-04-09: 80 ug via INTRAVENOUS

## 2020-04-09 MED ORDER — ONDANSETRON HCL 4 MG/2ML IJ SOLN
INTRAMUSCULAR | Status: DC | PRN
  Administered 2020-04-09: 4 mg via INTRAVENOUS

## 2020-04-09 MED ORDER — ACETAMINOPHEN 500 MG PO TABS: 1000.0000 mg | ORAL_TABLET | Freq: Once | ORAL | Status: AC

## 2020-04-09 MED ORDER — FENTANYL CITRATE (PF) 100 MCG/2ML IJ SOLN: 25.0000 ug | INTRAMUSCULAR | Status: DC | PRN

## 2020-04-09 SURGICAL SUPPLY — 1 items: super lock fidicual marker ×3 IMPLANT

## 2020-04-09 NOTE — Op Note (Signed)
Video Bronchoscopy with Electromagnetic Navigation and  Endobronchial Ultrasound Procedure Note  Date of Operation: 04/09/2020  Pre-op Diagnosis: Right upper lobe pulmonary nodule, mediastinal lymphadenopathy  Post-op Diagnosis: Same  Surgeon: Baltazar Apo  Assistants: None  Anesthesia: General endotracheal anesthesia  Operation: Flexible video fiberoptic bronchoscopy with electromagnetic navigation and biopsies.  Estimated Blood Loss: Minimal  Complications: None apparent  Indications and History: Daniel Ball is a 64 y.o. male with history tobacco use, COPD, interstitial lung disease from asbestos exposure.  He was found to have a 9 mm irregular nodule in the right upper lobe and mediastinal lymphadenopathy on CT scan of the chest 03/14/2020.  A PET scan on 03/26/2020 confirmed that the right upper lobe nodule was hypermetabolic but the mediastinal adenopathy was not.  Recommendation was made to achieve a tissue diagnosis via navigational bronchoscopy, endobronchial ultrasound and biopsies.  The risks, benefits, complications, treatment options and expected outcomes were discussed with the patient.  The possibilities of pneumothorax, pneumonia, reaction to medication, pulmonary aspiration, perforation of a viscus, bleeding, failure to diagnose a condition and creating a complication requiring transfusion or operation were discussed with the patient who freely signed the consent.    Description of Procedure: The patient was seen in the Preoperative Area, was examined and was deemed appropriate to proceed.  The patient was taken to [facility] endoscopy room 2, identified as Daniel Ball and the procedure verified as Flexible Video Fiberoptic Bronchoscopy.  A Time Out was held and the above information confirmed.   Prior to the date of the procedure a high-resolution CT scan of the chest was performed. Utilizing [place] a virtual tracheobronchial tree was generated to  allow the creation of distinct navigation pathways to the patient's parenchymal abnormalities. After being taken to the operating room general anesthesia was initiated and the patient  was orally intubated. The video fiberoptic bronchoscope was introduced via the endotracheal tube and a general inspection was performed which showed normal airways throughout. The extendable working channel and locator guide were introduced into the bronchoscope. The distinct navigation pathways prepared prior to this procedure were then utilized to navigate to within 0.6 cm of patient's right upper lobe nodule identified on CT scan.  It was difficult to obtain precisely at the nodule and it was accessed at a more oblique view.  The extendable working channel was secured into place and the locator guide was withdrawn.  Radial endobronchial ultrasound was performed to confirm location and optimize catheter position.  Under fluoroscopic guidance transbronchial needle brushings, transbronchial Wang needle biopsies, and transbronchial forceps biopsies were performed to be sent for cytology and pathology. A bronchioalveolar lavage was performed in the right upper lobe and sent for cytology.  3 fiducial markers were placed triangulating the lesion to facilitate radiation therapy should this become indicated going forward. Attention was then turned to the mediastinal lymph nodes. The standard scope was then withdrawn and the endobronchial ultrasound was used to identify and characterize the peritracheal, hilar and bronchial lymph nodes. Inspection showed no enlarged lymph nodes at stations 4L, 4R.  There was slight enlargement of station 7, slight enlargement at station 11 R. Using real-time ultrasound guidance Wang needle biopsies were take from Station 7 and 11 R nodes and were sent for cytology. The patient tolerated the procedure well without apparent complications. There was no significant blood loss. The bronchoscope was withdrawn.  Anesthesia was reversed and the patient was taken to the PACU for recovery  At the end of the  procedure a general airway inspection was performed and there was no evidence of active bleeding. The bronchoscope was removed.  The patient tolerated the procedure well. There was no significant blood loss and there were no obvious complications. A post-procedural chest x-ray is pending.  Samples: 1. Transbronchial needle brushings from right upper lobe nodule 2. Transbronchial Wang needle biopsies from right upper lobe nodule 3. Transbronchial forceps biopsies from right upper lobe nodule 4. Bronchoalveolar lavage from right upper lobe 5. Wang needle biopsies from 11 R node 6. Wang needle biopsies from 7 node   Plans:  The patient will be discharged from the PACU to home when recovered from anesthesia and after chest x-ray is reviewed. We will review the cytology, pathology and microbiology results with the patient when they become available. Outpatient followup will be with Dr Lamonte Sakai.    Baltazar Apo, MD, PhD 04/09/2020, 11:25 AM [facility] [phone] or if no answer [phone]

## 2020-04-09 NOTE — Discharge Instructions (Signed)
Flexible Bronchoscopy, Care After This sheet gives you information about how to care for yourself after your test. Your doctor may also give you more specific instructions. If you have problems or questions, contact your doctor. Follow these instructions at home: Eating and drinking  Do not eat or drink anything (not even water) for 2 hours after your test, or until your numbing medicine (local anesthetic) wears off.  When your numbness is gone and your cough and gag reflexes have come back, you may: ? Eat only soft foods. ? Slowly drink liquids.  The day after the test, go back to your normal diet. Driving  Do not drive for 24 hours if you were given a medicine to help you relax (sedative).  Do not drive or use heavy machinery while taking prescription pain medicine. General instructions   Take over-the-counter and prescription medicines only as told by your doctor.  Return to your normal activities as told. Ask what activities are safe for you.  Do not use any products that have nicotine or tobacco in them. This includes cigarettes and e-cigarettes. If you need help quitting, ask your doctor.  Keep all follow-up visits as told by your doctor. This is important. It is very important if you had a tissue sample (biopsy) taken. Get help right away if:  You have shortness of breath that gets worse.  You get light-headed.  You feel like you are going to pass out (faint).  You have chest pain.  You cough up: ? More than a little blood. ? More blood than before. Summary  Do not eat or drink anything (not even water) for 2 hours after your test, or until your numbing medicine wears off.  Do not use cigarettes. Do not use e-cigarettes.  Get help right away if you have chest pain.  Please call our office for any questions or concerns.  [phone].  You may restart your Plavix and Eliquis on 04/11/2020.  This information is not intended to replace advice given to you by  your health care provider. Make sure you discuss any questions you have with your health care provider. Document Revised: 08/20/2017 Document Reviewed: 09/25/2016 Elsevier Patient Education  2020 [employer].

## 2020-04-09 NOTE — Anesthesia Procedure Notes (Signed)
Procedure Name: Intubation Date/Time: 04/09/2020 9:31 AM Performed by: Neldon Newport, CRNA Pre-anesthesia Checklist: Timeout performed, Patient being monitored, Suction available, Patient identified and Emergency Drugs available Patient Re-evaluated:Patient Re-evaluated prior to induction Oxygen Delivery Method: Circle system utilized Preoxygenation: Pre-oxygenation with 100% oxygen Induction Type: IV induction and Rapid sequence Ventilation: Mask ventilation without difficulty and Oral airway inserted - appropriate to patient size Laryngoscope Size: Mac and 3 Grade View: Grade I Tube type: Oral Tube size: 8.5 mm Number of attempts: 1 Placement Confirmation: ETT inserted through vocal cords under direct vision,  positive ETCO2 and breath sounds checked- equal and bilateral Secured at: 22 cm Tube secured with: Tape Dental Injury: Teeth and Oropharynx as per pre-operative assessment

## 2020-04-09 NOTE — Anesthesia Postprocedure Evaluation (Signed)
Anesthesia Post Note  Patient: Daniel Ball  Procedure(s) Performed: VIDEO BRONCHOSCOPY WITH ENDOBRONCHIAL NAVIGATION (N/A ) VIDEO BRONCHOSCOPY WITH ENDOBRONCHIAL ULTRASOUND (N/A ) BRONCHIAL BRUSHINGS BRONCHIAL NEEDLE ASPIRATION BIOPSIES BRONCHIAL BIOPSIES FIDUCIAL MARKER PLACEMENT BRONCHIAL WASHINGS VIDEO BRONCHOSCOPY WITH RADIAL ENDOBRONCHIAL ULTRASOUND FINE NEEDLE ASPIRATION (FNA) LINEAR     Patient location during evaluation: PACU Anesthesia Type: General Level of consciousness: awake and alert Pain management: pain level controlled Vital Signs Assessment: post-procedure vital signs reviewed and stable Respiratory status: spontaneous breathing, nonlabored ventilation and respiratory function stable Cardiovascular status: blood pressure returned to baseline and stable Postop Assessment: no apparent nausea or vomiting Anesthetic complications: no   No complications documented.  Last Vitals:  Vitals:   04/09/20 1220 04/09/20 1230  BP: 99/67 116/65  Pulse: (!) 54 (!) 58  Resp: 12 20  Temp:    SpO2: 90% 93%    Last Pain:  Vitals:   04/09/20 1203  TempSrc:   PainSc: 0-No pain                 Detra Bores,W. EDMOND

## 2020-04-09 NOTE — H&P (Signed)
Daniel Ball is an 64 y.o. male.   Chief Complaint: Right upper lobe pulmonary nodule, mediastinal adenopathy HPI: 64 year old man with history, CAD with stent placement, GERD, hypertension, OSA, interstitial lung disease, COPD.  Also with a history of asbestos exposure.  We found a small right upper lobe pulmonary nodule and mediastinal lymphadenopathy on a follow-up CT scan to do surveillance on his interstitial lung disease the PET scan on 03/26/2020 which showed that the nodule was hypermetabolic.  He presents today for navigational bronchoscopy and endobronchial ultrasound to evaluate further.  No new issues reported.  Stable dyspnea, no cough, no sputum.  Past Medical History:  Diagnosis Date  . Arthritis   . Atrial fibrillation ([place])   . COPD (chronic obstructive pulmonary disease) ([place])   . Coronary artery disease    a. 12/2019: s/p orbital atherectomy and DES placement to proximal/mid LAD.   [name] GERD (gastroesophageal reflux disease)   . Hyperlipidemia   . Hypertension    hx HBP - MEDS DC'D 10 YRS since BP has been in normal range  . Impingement syndrome of shoulder    left  . Pre-diabetes   . Shortness of breath    with exertion  . Sleep apnea    couldnt tolerate CPAP  . Spinal stenosis     Past Surgical History:  Procedure Laterality Date  . CORONARY ATHERECTOMY N/A 01/18/2020   Procedure: CORONARY ATHERECTOMY;  Surgeon: Nelva Bush, MD;  Location: [place] [facility];  Service: Cardiovascular;  Laterality: N/A;  . CORONARY STENT INTERVENTION  01/18/2020  . CORONARY STENT INTERVENTION N/A 01/18/2020   Procedure: CORONARY STENT INTERVENTION;  Surgeon: Nelva Bush, MD;  Location: [place] [facility];  Service: Cardiovascular;  Laterality: N/A;  . EYE SURGERY Left   . HAND SURGERY Right    thumb  . INTRAVASCULAR PRESSURE WIRE/FFR STUDY N/A 11/02/2019   Procedure: INTRAVASCULAR PRESSURE WIRE/FFR STUDY;  Surgeon: Nelva Bush, MD;  Location: [place] [facility];   Service: Cardiovascular;  Laterality: N/A;  . INTRAVASCULAR ULTRASOUND/IVUS N/A 01/18/2020   Procedure: Intravascular Ultrasound/IVUS;  Surgeon: Nelva Bush, MD;  Location: [place] [facility];  Service: Cardiovascular;  Laterality: N/A;  . LUMBAR LAMINECTOMY/DECOMPRESSION MICRODISCECTOMY N/A 02/07/2014   Procedure: LUMBAR DECOMPRESSION Lumbar one-Lumbar five;  Surgeon: [name], MD;  Location: [facility];  Service: Orthopedics;  Laterality: N/A;  . RIGHT/LEFT HEART CATH AND CORONARY ANGIOGRAPHY N/A 11/02/2019   Procedure: RIGHT/LEFT HEART CATH AND CORONARY ANGIOGRAPHY;  Surgeon: Nelva Bush, MD;  Location: [place] [facility];  Service: Cardiovascular;  Laterality: N/A;  . SHOULDER ARTHROSCOPY WITH SUBACROMIAL DECOMPRESSION Left 04/12/2014   Procedure: LEFT SHOULDER ARTHROSCOPY WITH SUBACROMIAL DECOMPRESSION AND LABRAL DEBRIDEMENT, ROTATOR CUFF DEBRIDEMENT, BICEPS DEBRIDEMENT;  Surgeon: [name], MD;  Location: [facility];  Service: Orthopedics;  Laterality: Left;    Family History  Family history unknown: Yes   Social History:  reports that he has been smoking cigarettes. He has a 27.00 pack-year smoking history. He has never used smokeless tobacco. He reports that he does not drink alcohol and does not use drugs.  Allergies: No Known Allergies  Medications Prior to Admission  Medication Sig Dispense Refill  . albuterol (VENTOLIN HFA) 108 (90 Base) MCG/ACT inhaler Inhale 2 puffs into the lungs every 4 (four) hours as needed for wheezing or shortness of breath. (Patient taking differently: Inhale 2 puffs into the lungs daily. ) 18 g 11  . amLODipine (NORVASC) 5 MG tablet Take 1.5 tablets (7.5 mg total)  by mouth daily. 135 tablet 3  . buPROPion (WELLBUTRIN XL) 300 MG 24 hr tablet Take 300 mg by mouth daily.    . cyclobenzaprine (FLEXERIL) 10 MG tablet Take 10 mg by mouth 3 (three) times daily as needed for muscle spasms.    . furosemide (LASIX) 40 MG tablet Take 1 tablet (40 mg  total) by mouth daily as needed (for edema or weight gain.). 30 tablet 5  . isosorbide mononitrate (IMDUR) 60 MG 24 hr tablet Take 60 mg by mouth daily.    . metoprolol succinate (TOPROL XL) 25 MG 24 hr tablet Take 1 tablet (25 mg total) by mouth daily. 90 tablet 1  . pantoprazole (PROTONIX) 40 MG tablet Take 1 tablet (40 mg total) by mouth daily. 60 tablet 2  . potassium chloride SA (KLOR-CON) 20 MEQ tablet Take 1 tablet (20 mEq total) by mouth daily as needed (take on the days you take Lasix.). 30 tablet 5  . rosuvastatin (CRESTOR) 40 MG tablet Take 1 tablet (40 mg total) by mouth daily. 90 tablet 3  . valsartan (DIOVAN) 320 MG tablet Take 320 mg by mouth daily.    [name] apixaban (ELIQUIS) 5 MG TABS tablet Take 1 tablet (5 mg total) by mouth 2 (two) times daily. 60 tablet 5  . clopidogrel (PLAVIX) 75 MG tablet Take 1 tablet (75 mg total) by mouth daily with breakfast. 90 tablet 3  . nitroGLYCERIN (NITROSTAT) 0.4 MG SL tablet Place 1 tablet (0.4 mg total) under the tongue every 5 (five) minutes as needed for chest pain. 25 tablet prn  . Tiotropium Bromide-Olodaterol (STIOLTO RESPIMAT) 2.5-2.5 MCG/ACT AERS Inhale 2 puffs into the lungs daily. (Patient not taking: Reported on 02/28/2020) 4 g 0    Results for orders placed or performed during the hospital encounter of 04/09/20 (from the past 48 hour(s))  Comprehensive metabolic panel     Status: Abnormal   Collection Time: 04/09/20  7:05 AM  Result Value Ref Range   Sodium 138 135 - 145 mmol/L   Potassium 4.3 3.5 - 5.1 mmol/L   Chloride 105 98 - 111 mmol/L   CO2 23 22 - 32 mmol/L   Glucose, Bld 138 (H) 70 - 99 mg/dL    Comment: Glucose reference range applies only to samples taken after fasting for at least 8 hours.   BUN 17 8 - 23 mg/dL   Creatinine, Ser 1.05 0.61 - 1.24 mg/dL   Calcium 9.1 8.9 - 10.3 mg/dL   Total Protein 7.0 6.5 - 8.1 g/dL   Albumin 3.8 3.5 - 5.0 g/dL   AST 24 15 - 41 U/L   ALT 30 0 - 44 U/L   Alkaline Phosphatase 63 38 -  126 U/L   Total Bilirubin 0.7 0.3 - 1.2 mg/dL   GFR calc non Af Amer >60 >60 mL/min   GFR calc Af Amer >60 >60 mL/min   Anion gap 10 5 - 15    Comment: Performed at [facility], [place] [address] [place] [address]  CBC     Status: Abnormal   Collection Time: 04/09/20  7:05 AM  Result Value Ref Range   WBC 6.5 4.0 - 10.5 K/uL   RBC 4.74 4.22 - 5.81 MIL/uL   Hemoglobin 14.7 13.0 - 17.0 g/dL   HCT 44.7 39 - 52 %   MCV 94.3 80.0 - 100.0 fL   MCH 31.0 26.0 - 34.0 pg   MCHC 32.9 30.0 - 36.0 g/dL  RDW 12.8 11.5 - 15.5 %   Platelets 133 (L) 150 - 400 K/uL    Comment: REPEATED TO VERIFY   nRBC 0.0 0.0 - 0.2 %    Comment: Performed at [facility], [place] [address], [place], [place] [address]  Protime-INR     Status: None   Collection Time: 04/09/20  7:05 AM  Result Value Ref Range   Prothrombin Time 12.6 11.4 - 15.2 seconds   INR 1.0 0.8 - 1.2    Comment: (NOTE) INR goal varies based on device and disease states. Performed at [facility], [place] [address] [place], [place] [address]   APTT     Status: None   Collection Time: 04/09/20  7:05 AM  Result Value Ref Range   aPTT 30 24 - 36 seconds    Comment: Performed at [facility] [address] [place], [place] [address]  Glucose, capillary     Status: Abnormal   Collection Time: 04/09/20  8:54 AM  Result Value Ref Range   Glucose-Capillary 128 (H) 70 - 99 mg/dL    Comment: Glucose reference range applies only to samples taken after fasting for at least 8 hours.   No results found.  Review of Systems As above  Blood pressure (!) 157/69, pulse 65, temperature 98.5 F (36.9 C), temperature source Oral, resp. rate 18, height 5\' 4"  (1.626 m), weight 112 kg, SpO2 97 %. Physical Exam  Gen: Pleasant, obese, in no distress,  normal affect, intermittent cough  ENT: No lesions,  mouth clear,  oropharynx clear, no postnasal drip, strong voice  Neck: No JVD, no stridor  Lungs: No use of accessory  muscles, distant, scattered rhonchi, no wheeze, soft inspiratory crackles  Cardiovascular: RRR, heart sounds normal, no murmur or gallops, no peripheral edema  Musculoskeletal: No deformities, no cyanosis or clubbing  Neuro: alert, awake, non focal  Skin: Warm, no lesions or rashes     Assessment/Plan Right upper lobe pulmonary nodule hypermetabolic on PET scan with nonhypermetabolic mediastinal lymphadenopathy.  Presents today for navigational bronchoscopy and EBUS for tissue diagnosis to help plan appropriate therapy.  Procedure discussed in full with the patient all questions answered.  He understands the risk, benefits and agrees to proceed.  Collene Gobble, MD 04/09/2020, 9:12 AM

## 2020-04-09 NOTE — Transfer of Care (Signed)
Immediate Anesthesia Transfer of Care Note  Patient: Daniel Ball  Procedure(s) Performed: VIDEO BRONCHOSCOPY WITH ENDOBRONCHIAL NAVIGATION (N/A ) VIDEO BRONCHOSCOPY WITH ENDOBRONCHIAL ULTRASOUND (N/A ) BRONCHIAL BRUSHINGS BRONCHIAL NEEDLE ASPIRATION BIOPSIES BRONCHIAL BIOPSIES FIDUCIAL MARKER PLACEMENT BRONCHIAL WASHINGS VIDEO BRONCHOSCOPY WITH RADIAL ENDOBRONCHIAL ULTRASOUND  Patient Location: PACU  Anesthesia Type:General  Level of Consciousness: awake, alert  and oriented  Airway & Oxygen Therapy: Patient Spontanous Breathing and Patient connected to face mask oxygen  Post-op Assessment: Report given to RN, Post -op Vital signs reviewed and stable and Patient moving all extremities X 4  Post vital signs: Reviewed and stable  Last Vitals:  Vitals Value Taken Time  BP    Temp    Pulse 59 04/09/20 1132  Resp 25 04/09/20 1132  SpO2 96 % 04/09/20 1132  Vitals shown include unvalidated device data.  Last Pain:  Vitals:   04/09/20 0739  TempSrc:   PainSc: 0-No pain         Complications: No complications documented.

## 2020-04-10 ENCOUNTER — Encounter (HOSPITAL_COMMUNITY): Payer: Self-pay | Admitting: Emergency Medicine

## 2020-04-10 ENCOUNTER — Encounter (HOSPITAL_COMMUNITY)
Admission: RE | Admit: 2020-04-10 | Discharge: 2020-04-10 | Disposition: A | Discharge status: Home / Self Care | Payer: Medicare Other | Source: Ambulatory Visit | Attending: Internal Medicine | Admitting: Internal Medicine

## 2020-04-10 DIAGNOSIS — Z955 Presence of coronary angioplasty implant and graft: Secondary | ICD-10-CM

## 2020-04-10 NOTE — Progress Notes (Signed)
I have reviewed a Home Exercise Prescription with Daniel Ball . Daniel Ball is not currently exercising at home.  The patient was advised to walk 2-3 days a week for 30-45 minutes.  Daniel Ball and I discussed how to progress their exercise prescription.  The patient stated that their goals were breathe better, lose weight, and preform ADL's better.  The patient stated that they understand the exercise prescription.  We reviewed exercise guidelines, target heart rate during exercise, RPE Scale, weather conditions, NTG use, endpoints for exercise, warmup and cool down.  Patient is encouraged to come to me with any questions. I will continue to follow up with the patient to assist them with progression and safety.

## 2020-04-10 NOTE — Progress Notes (Signed)
Daily Session Note  Patient Details  Name: Daniel Ball MRN: [id number] Date of Birth: 1955-09-24 Referring Provider:     [facility] from 03/06/2020 in [place]  Referring Provider Dr. Harrington Challenger      Encounter Date: 04/10/2020  Check In:  Session Check In - 04/10/20 0825      Check-In   Supervising physician immediately available to respond to emergencies See telemetry face sheet for immediately available MD    Location [facility]    Staff Present [name], Exercise Physiologist;Ylianna Almanzar Kris Mouton, MS, ACSM-CEP, Exercise Physiologist;Carlette Wilber Oliphant, RN, BSN    Virtual Visit No    Medication changes reported     No    Fall or balance concerns reported    No    Tobacco Cessation No Change    Warm-up and Cool-down Performed as group-led instruction    Resistance Training Performed Yes    VAD Patient? No    PAD/SET Patient? No      Pain Assessment   Currently in Pain? No/denies    Pain Score 0-No pain    Multiple Pain Sites No           Capillary Blood Glucose: Results for orders placed or performed during the hospital encounter of 04/09/20 (from the past 24 hour(s))  Glucose, capillary     Status: Abnormal   Collection Time: 04/09/20  8:54 AM  Result Value Ref Range   Glucose-Capillary 128 (H) 70 - 99 mg/dL  Glucose, capillary     Status: Abnormal   Collection Time: 04/09/20 11:32 AM  Result Value Ref Range   Glucose-Capillary 128 (H) 70 - 99 mg/dL   Comment 1 Notify RN    Comment 2 Document in Chart       Social History   Tobacco Use  Smoking Status Current Every Day Smoker  . Packs/day: 0.50  . Years: 54.00  . Pack years: 27.00  . Types: Cigarettes  Smokeless Tobacco Never Used  Tobacco Comment   10 cigarettes a day 02/28/20 [facility]    Goals Met:  Independence with exercise equipment Exercise tolerated well No report of cardiac concerns or symptoms Strength training completed  today  Goals Unmet:  Not Applicable  Comments: checkout time is 0915   Dr. Kathie Dike is Medical Director for [facility].

## 2020-04-11 LAB — SURGICAL PATHOLOGY

## 2020-04-11 LAB — CYTOLOGY - NON PAP

## 2020-04-12 ENCOUNTER — Encounter (HOSPITAL_COMMUNITY)
Admission: RE | Admit: 2020-04-12 | Discharge: 2020-04-12 | Disposition: A | Discharge status: Home / Self Care | Payer: Medicare Other | Source: Ambulatory Visit | Attending: Internal Medicine | Admitting: Internal Medicine

## 2020-04-12 ENCOUNTER — Other Ambulatory Visit: Payer: Self-pay

## 2020-04-12 ENCOUNTER — Telehealth: Payer: Self-pay | Admitting: Emergency Medicine

## 2020-04-12 DIAGNOSIS — Z955 Presence of coronary angioplasty implant and graft: Secondary | ICD-10-CM

## 2020-04-12 DIAGNOSIS — R911 Solitary pulmonary nodule: Secondary | ICD-10-CM

## 2020-04-12 NOTE — Telephone Encounter (Signed)
Discussed results with the patient and his wife >> bx's and cytology negativbe on the nodule and his nodes. Possibility of a false negative - discussed options including referral for sgy (he wants to avoid), repeat Ct chest in October and then discuss possible repeat FOB vs referral to XRT withouit a tissue dx.   Please order a SuperD CT scan in October, no contrast, dx is pulmonary nodule.

## 2020-04-12 NOTE — Telephone Encounter (Signed)
Patient called, order for Super D placed for October 2021. Patient verbalized understanding of plan.

## 2020-04-12 NOTE — Progress Notes (Signed)
Cardiac Individual Treatment Plan  Patient Details  Name: Daniel Ball MRN: [id number] Date of Birth: 05-Apr-1956 Referring Provider:     [facility] from 03/06/2020 in [place]  Referring Provider Dr. Harrington Challenger      Initial Encounter Date:    CARDIAC REHAB PHASE II ORIENTATION from 03/06/2020 in [place]  Date 03/06/20      Visit Diagnosis: Status post coronary artery stent placement  Patient's Home Medications on Admission:  Current Outpatient Medications:    albuterol (VENTOLIN HFA) 108 (90 Base) MCG/ACT inhaler, Inhale 2 puffs into the lungs every 4 (four) hours as needed for wheezing or shortness of breath. (Patient taking differently: Inhale 2 puffs into the lungs daily. ), Disp: 18 g, Rfl: 11   amLODipine (NORVASC) 5 MG tablet, Take 1.5 tablets (7.5 mg total) by mouth daily., Disp: 135 tablet, Rfl: 3   apixaban (ELIQUIS) 5 MG TABS tablet, Take 1 tablet (5 mg total) by mouth 2 (two) times daily. Okay to restart this medication on 7/22, Disp: 60 tablet, Rfl: 5   buPROPion (WELLBUTRIN XL) 300 MG 24 hr tablet, Take 300 mg by mouth daily., Disp: , Rfl:    clopidogrel (PLAVIX) 75 MG tablet, Take 1 tablet (75 mg total) by mouth daily with breakfast. Okay to restart this medication on 7/22, Disp: 90 tablet, Rfl: 3   cyclobenzaprine (FLEXERIL) 10 MG tablet, Take 10 mg by mouth 3 (three) times daily as needed for muscle spasms., Disp: , Rfl:    furosemide (LASIX) 40 MG tablet, Take 1 tablet (40 mg total) by mouth daily as needed (for edema or weight gain.)., Disp: 30 tablet, Rfl: 5   isosorbide mononitrate (IMDUR) 60 MG 24 hr tablet, Take 60 mg by mouth daily., Disp: , Rfl:    metoprolol succinate (TOPROL XL) 25 MG 24 hr tablet, Take 1 tablet (25 mg total) by mouth daily., Disp: 90 tablet, Rfl: 1   nitroGLYCERIN (NITROSTAT) 0.4 MG SL tablet, Place 1 tablet (0.4 mg total) under the tongue every 5 (five) minutes  as needed for chest pain., Disp: 25 tablet, Rfl: prn   pantoprazole (PROTONIX) 40 MG tablet, Take 1 tablet (40 mg total) by mouth daily., Disp: 60 tablet, Rfl: 2   potassium chloride SA (KLOR-CON) 20 MEQ tablet, Take 1 tablet (20 mEq total) by mouth daily as needed (take on the days you take Lasix.)., Disp: 30 tablet, Rfl: 5   rosuvastatin (CRESTOR) 40 MG tablet, Take 1 tablet (40 mg total) by mouth daily., Disp: 90 tablet, Rfl: 3   valsartan (DIOVAN) 320 MG tablet, Take 320 mg by mouth daily., Disp: , Rfl:   Past Medical History: Past Medical History:  Diagnosis Date   Arthritis    Atrial fibrillation ([place])    COPD (chronic obstructive pulmonary disease) ([place])    Coronary artery disease    a. 12/2019: s/p orbital atherectomy and DES placement to proximal/mid LAD.    GERD (gastroesophageal reflux disease)    Hyperlipidemia    Hypertension    hx HBP - MEDS DC'D 10 YRS since BP has been in normal range   Impingement syndrome of shoulder    left   Pre-diabetes    Shortness of breath    with exertion   Sleep apnea    couldnt tolerate CPAP   Spinal stenosis     Tobacco Use: Social History   Tobacco Use  Smoking Status Current Every Day Smoker   Packs/day: 0.50  Years: 54.00   Pack years: 27.00   Types: Cigarettes  Smokeless Tobacco Never Used  Tobacco Comment   10 cigarettes a day 02/28/20 [facility]    Labs: Recent Review Flowsheet Data    Labs for ITP Cardiac and Pulmonary Rehab Latest Ref Rng & Units 11/02/2019 11/02/2019 01/19/2020   Cholestrol 0 - 200 mg/dL - - 142   LDLCALC 0 - 99 mg/dL - - 78   HDL >40 mg/dL - - 36(L)   Trlycerides <150 mg/dL - - 139   PHART 7.35 - 7.45 - 7.365 -   PCO2ART 32 - 48 mmHg - 51.5(H) -   HCO3 20.0 - 28.0 mmol/L 27.1 29.4(H) -   TCO2 22 - 32 mmol/L 29 31 -   O2SAT % 67.0 99.0 -      Capillary Blood Glucose: Lab Results  Component Value Date   GLUCAP 128 (H) 04/09/2020   GLUCAP 128 (H) 04/09/2020   GLUCAP 143 (H)  04/09/2020     Exercise Target Goals: Exercise Program Goal: Individual exercise prescription set using results from initial 6 min walk test and THRR while considering  patients activity barriers and safety.   Exercise Prescription Goal: Starting with aerobic activity 30 plus minutes a day, 3 days per week for initial exercise prescription. Provide home exercise prescription and guidelines that participant acknowledges understanding prior to discharge.  Activity Barriers & Risk Stratification:  Activity Barriers & Cardiac Risk Stratification - 03/06/20 1150      Activity Barriers & Cardiac Risk Stratification   Activity Barriers Deconditioning;Chest Pain/Angina;Other (comment)    Comments Bilateral hip pain    Cardiac Risk Stratification High           6 Minute Walk:  6 Minute Walk    Row Name 03/06/20 1120         6 Minute Walk   Phase Initial     Distance 1000 feet     Walk Time 6 minutes     # of Rest Breaks 0     MPH 1.89     METS 2.45     RPE 13     Perceived Dyspnea  14     VO2 Peak 6.13     Symptoms Yes (comment)     Comments Bilateral hip pain 6/10 and c/o of chest burning 3/10 after walking.     Resting HR 73 bpm     Resting BP 110/60     Resting Oxygen Saturation  95 %     Exercise Oxygen Saturation  during 6 min walk 97 %     Max Ex. HR 81 bpm     Max Ex. BP 150/80     2 Minute Post BP 120/80            Oxygen Initial Assessment:   Oxygen Re-Evaluation:   Oxygen Discharge (Final Oxygen Re-Evaluation):   Initial Exercise Prescription:  Initial Exercise Prescription - 03/06/20 1100      Date of Initial Exercise RX and Referring Provider   Date 03/06/20    Referring Provider Dr. Harrington Challenger    Expected Discharge Date 06/06/20      Treadmill   MPH 1.1    Grade 0    Minutes 17    METs 1.8      T5 Nustep   Level 1    SPM 62    Minutes 22    METs 2      Prescription Details   Frequency (times per week)  3    Duration Progress to 30  minutes of continuous aerobic without signs/symptoms of physical distress      Intensity   THRR 40-80% of Max Heartrate [phone]    Ratings of Perceived Exertion 11-13    Perceived Dyspnea 0-4      Resistance Training   Training Prescription Yes    Weight 1    Reps 10-15           Perform Capillary Blood Glucose checks as needed.  Exercise Prescription Changes:   Exercise Prescription Changes    Row Name 03/13/20 1134 04/01/20 1400 04/10/20 1000         Response to Exercise   Blood Pressure (Admit) 132/64 114/72 --     Blood Pressure (Exercise) 138/70 148/64 --     Blood Pressure (Exit) 118/64 110/70 --     Heart Rate (Admit) 64 bpm 68 bpm --     Heart Rate (Exercise) 84 bpm 91 bpm --     Heart Rate (Exit) 78 bpm 80 bpm --     Rating of Perceived Exertion (Exercise) 11 13 --     Duration Continue with 30 min of aerobic exercise without signs/symptoms of physical distress. Progress to 30 minutes of  aerobic without signs/symptoms of physical distress --     Intensity THRR unchanged THRR unchanged --       Progression   Progression Continue to progress workloads to maintain intensity without signs/symptoms of physical distress. Continue to progress workloads to maintain intensity without signs/symptoms of physical distress. --       Horticulturist, commercial Prescription Yes Yes --     Weight 2 lbs 3 --     Reps 10-15 10-15 --       Treadmill   MPH 1.1 1.5 --     Grade 0 0 --     Minutes 17 17 --     METs 1.8 2.15 --       T5 Nustep   Level 1 1 --     SPM 111 122 --     Minutes 22 22 --     METs 2.1 2.3 --       Home Exercise Plan   Plans to continue exercise at -- -- Home (comment)     Frequency -- -- Add 2 additional days to program exercise sessions.     Initial Home Exercises Provided -- -- 04/10/20            Exercise Comments:   Exercise Comments    Row Name 03/06/20 1140 03/11/20 1004 03/20/20 1434 04/10/20 1036     Exercise Comments  Patient stopped at five minutes during walk test due to hip pain 6/10 which started to compromised his gait. He also c/o of his chest burning 3/10 after walking. Patient completed his first day of exercise in cardiac rehab. He complained of hip pain while on the treadmill (7/10 R hip and 4/10 in L hip). Otherwise he tolerated exercise well. Patient is fairly new to the program and has been preforming well so far. Home Exercise Reviewed           Exercise Goals and Review:   Exercise Goals    Row Name 03/06/20 1134 04/12/20 1159           Exercise Goals   Increase Physical Activity Yes Yes      Intervention Provide advice, education, support and counseling about physical activity/exercise needs.;Develop an individualized exercise  prescription for aerobic and resistive training based on initial evaluation findings, risk stratification, comorbidities and participant's personal goals. Provide advice, education, support and counseling about physical activity/exercise needs.;Develop an individualized exercise prescription for aerobic and resistive training based on initial evaluation findings, risk stratification, comorbidities and participant's personal goals.      Expected Outcomes Short Term: Attend rehab on a regular basis to increase amount of physical activity.;Long Term: Add in home exercise to make exercise part of routine and to increase amount of physical activity. Short Term: Attend rehab on a regular basis to increase amount of physical activity.;Long Term: Add in home exercise to make exercise part of routine and to increase amount of physical activity.;Long Term: Exercising regularly at least 3-5 days a week.      Increase Strength and Stamina Yes Yes      Intervention Provide advice, education, support and counseling about physical activity/exercise needs.;Develop an individualized exercise prescription for aerobic and resistive training based on initial evaluation findings, risk  stratification, comorbidities and participant's personal goals. Provide advice, education, support and counseling about physical activity/exercise needs.;Develop an individualized exercise prescription for aerobic and resistive training based on initial evaluation findings, risk stratification, comorbidities and participant's personal goals.      Expected Outcomes Long Term: Improve cardiorespiratory fitness, muscular endurance and strength as measured by increased METs and functional capacity (6MWT);Short Term: Perform resistance training exercises routinely during rehab and add in resistance training at home Short Term: Increase workloads from initial exercise prescription for resistance, speed, and METs.;Short Term: Perform resistance training exercises routinely during rehab and add in resistance training at home;Long Term: Improve cardiorespiratory fitness, muscular endurance and strength as measured by increased METs and functional capacity (6MWT)      Able to understand and use rate of perceived exertion (RPE) scale Yes Yes      Intervention Provide education and explanation on how to use RPE scale Provide education and explanation on how to use RPE scale      Expected Outcomes Short Term: Able to use RPE daily in rehab to express subjective intensity level;Long Term:  Able to use RPE to guide intensity level when exercising independently Short Term: Able to use RPE daily in rehab to express subjective intensity level;Long Term:  Able to use RPE to guide intensity level when exercising independently      Able to understand and use Dyspnea scale Yes --      Intervention Provide education and explanation on how to use Dyspnea scale --      Expected Outcomes Short Term: Able to use Dyspnea scale daily in rehab to express subjective sense of shortness of breath during exertion;Long Term: Able to use Dyspnea scale to guide intensity level when exercising independently --      Knowledge and understanding of  Target Heart Rate Range (THRR) Yes Yes      Intervention Provide education and explanation of THRR including how the numbers were predicted and where they are located for reference Provide education and explanation of THRR including how the numbers were predicted and where they are located for reference      Expected Outcomes Long Term: Able to use THRR to govern intensity when exercising independently;Short Term: Able to state/look up THRR Short Term: Able to state/look up THRR;Short Term: Able to use daily as guideline for intensity in rehab;Long Term: Able to use THRR to govern intensity when exercising independently      Able to check pulse independently Yes Yes  Intervention Provide education and demonstration on how to check pulse in carotid and radial arteries.;Review the importance of being able to check your own pulse for safety during independent exercise Provide education and demonstration on how to check pulse in carotid and radial arteries.;Review the importance of being able to check your own pulse for safety during independent exercise      Expected Outcomes Short Term: Able to explain why pulse checking is important during independent exercise;Long Term: Able to check pulse independently and accurately Short Term: Able to explain why pulse checking is important during independent exercise;Long Term: Able to check pulse independently and accurately      Understanding of Exercise Prescription Yes Yes      Intervention Provide education, explanation, and written materials on patient's individual exercise prescription Provide education, explanation, and written materials on patient's individual exercise prescription      Expected Outcomes Short Term: Able to explain program exercise prescription;Long Term: Able to explain home exercise prescription to exercise independently Short Term: Able to explain program exercise prescription;Long Term: Able to explain home exercise prescription to  exercise independently             Exercise Goals Re-Evaluation :  Exercise Goals Re-Evaluation    Row Name 04/12/20 1047             Exercise Goal Re-Evaluation   Exercise Goals Review Increase Physical Activity;Increase Strength and Stamina;Able to understand and use rate of perceived exertion (RPE) scale;Knowledge and understanding of Target Heart Rate Range (THRR);Able to check pulse independently;Understanding of Exercise Prescription       Comments Patient has attended 10 exercise sessions. Patient is still smoking 10 cigarette per day even though he has been counciled on quitting. Patient feels his endurance is progressing and his met levels are increasing as well. Patient is exercising at 2.3 mets on the stepper. Will continue to monitor and progress as able.       Expected Outcomes Reach goals and increase stamia               Discharge Exercise Prescription (Final Exercise Prescription Changes):  Exercise Prescription Changes - 04/10/20 1000      Home Exercise Plan   Plans to continue exercise at Home (comment)    Frequency Add 2 additional days to program exercise sessions.    Initial Home Exercises Provided 04/10/20           Nutrition:  Target Goals: Understanding of nutrition guidelines, daily intake of sodium <1559m, cholesterol <2088m calories 30% from fat and 7% or less from saturated fats, daily to have 5 or more servings of fruits and vegetables.  Biometrics:  Pre Biometrics - 03/06/20 1136      Pre Biometrics   Height 5' 4"  (1.626 m)    Weight 113.9 kg    Waist Circumference 50.5 inches    Hip Circumference 44 inches    Waist to Hip Ratio 1.15 %    BMI (Calculated) 43.08    Triceps Skinfold 6 mm    % Body Fat 35.5 %    Grip Strength 33.3 kg    Single Leg Stand 2.89 seconds            Nutrition Therapy Plan and Nutrition Goals:  Nutrition Therapy & Goals - 04/12/20 1431      Personal Nutrition Goals   Comments Patient continues to  says he is using weight watcher's on you-tube to use recipes to lose weight. His long term goal is  to lose 40 lbs. Program goal is 10 lbs. He has lost 1 lb since last 30 day review. He says his weight loss has slowed. Will continue to monitor.      Intervention Plan   Intervention Nutrition handout(s) given to patient.           Nutrition Assessments:  Nutrition Assessments - 03/06/20 1053      Rate Your Plate Scores   Pre Score 100           Nutrition Goals Re-Evaluation:   Nutrition Goals Discharge (Final Nutrition Goals Re-Evaluation):   Psychosocial: Target Goals: Acknowledge presence or absence of significant depression and/or stress, maximize coping skills, provide positive support system. Participant is able to verbalize types and ability to use techniques and skills needed for reducing stress and depression.  Initial Review & Psychosocial Screening:  Initial Psych Review & Screening - 03/06/20 1158      Initial Review   Current issues with None Identified      Family Dynamics   Good Support System? Yes      Barriers   Psychosocial barriers to participate in program There are no identifiable barriers or psychosocial needs.      Screening Interventions   Interventions Encouraged to exercise;Provide feedback about the scores to participant    Expected Outcomes Short Term goal: Identification and review with participant of any Quality of Life or Depression concerns found by scoring the questionnaire.;Long Term goal: The participant improves quality of Life and PHQ9 Scores as seen by post scores and/or verbalization of changes           Quality of Life Scores:  Quality of Life - 03/06/20 1138      Quality of Life   Select Quality of Life      Quality of Life Scores   Health/Function Pre 14.04 %    Socioeconomic Pre 23.5 %    Psych/Spiritual Pre 20.43 %    Family Pre 27.6 %    GLOBAL Pre 19.45 %          Scores of 19 and below usually indicate a  poorer quality of life in these areas.  A difference of  2-3 points is a clinically meaningful difference.  A difference of 2-3 points in the total score of the Quality of Life Index has been associated with significant improvement in overall quality of life, self-image, physical symptoms, and general health in studies assessing change in quality of life.  PHQ-9: Recent Review Flowsheet Data    Depression screen [facility] 2/9 03/06/2020   Decreased Interest 1   Down, Depressed, Hopeless 1   PHQ - 2 Score 2   Altered sleeping 2   Tired, decreased energy 2   Change in appetite 1   Feeling bad or failure about yourself  0   Trouble concentrating 1   Moving slowly or fidgety/restless 0   Suicidal thoughts 0   PHQ-9 Score 8   Difficult doing work/chores Somewhat difficult     Interpretation of Total Score  Total Score Depression Severity:  1-4 = Minimal depression, 5-9 = Mild depression, 10-14 = Moderate depression, 15-19 = Moderately severe depression, 20-27 = Severe depression   Psychosocial Evaluation and Intervention:  Psychosocial Evaluation - 03/06/20 1159      Psychosocial Evaluation & Interventions   Interventions Encouraged to exercise with the program and follow exercise prescription;Stress management education;Relaxation education    Comments Patient's initial QOL score was 19.45 and  his PHQ-9 score was  8. He has no h/o depression. He says he is not depressed but his scores reflect his frustration with not being able to do the things he wants to do. He says he has good family support and a positive outlook. Will continue to monitor.    Expected Outcomes Patient will have no psychosocial issues identified at discharge.    Continue Psychosocial Services  No Follow up required           Psychosocial Re-Evaluation:  Psychosocial Re-Evaluation    [place] Name 03/20/20 1456 04/12/20 1434           Psychosocial Re-Evaluation   Current issues with None Identified None Identified       Comments Patient's initial QOL score was 19.45 and his PHQ-9 score was 8. He says he does feels frustruated at times due to not being able to do the things he wants to do. He hopes this will improve as he continues the program. Will continue to montior. Patient continues to have no psychosocial issues identified. Will continue to monitor.      Expected Outcomes Patient will have no psychosocial issues identified at discharge with improved QOL and PHQ-9 scores. Patient will have no psychosocial issues identified at discharge with improved QOL and PHQ-9 scores.      Interventions Stress management education;Encouraged to attend Cardiac Rehabilitation for the exercise;Relaxation education Stress management education;Encouraged to attend Cardiac Rehabilitation for the exercise;Relaxation education      Continue Psychosocial Services  No Follow up required No Follow up required             Psychosocial Discharge (Final Psychosocial Re-Evaluation):  Psychosocial Re-Evaluation - 04/12/20 1434      Psychosocial Re-Evaluation   Current issues with None Identified    Comments Patient continues to have no psychosocial issues identified. Will continue to monitor.    Expected Outcomes Patient will have no psychosocial issues identified at discharge with improved QOL and PHQ-9 scores.    Interventions Stress management education;Encouraged to attend Cardiac Rehabilitation for the exercise;Relaxation education    Continue Psychosocial Services  No Follow up required           Vocational Rehabilitation: Provide vocational rehab assistance to qualifying candidates.   Vocational Rehab Evaluation & Intervention:  Vocational Rehab - 03/06/20 1054      Initial Vocational Rehab Evaluation & Intervention   Assessment shows need for Vocational Rehabilitation No      Vocational Rehab Re-[facility]va[facility]   Comments Patient disabled.           Education: Education Goals: Education classes will be provided on  a weekly basis, covering required topics. Participant will state understanding/return demonstration of topics presented.  Learning Barriers/Preferences:   Education Topics: Hypertension, Hypertension Reduction -Define heart disease and high blood pressure. Discus how high blood pressure affects the body and ways to reduce high blood pressure.   CARDIAC REHAB PHASE II EXERCISE from 04/10/2020 in [facility]  Date 03/27/20  Educator DF  Instruction Review Code 2- Demonstrated Understanding      Exercise and Your Heart -Discuss why it is important to exercise, the FITT principles of exercise, normal and abnormal responses to exercise, and how to exercise safely.   Angina -Discuss definition of angina, causes of angina, treatment of angina, and how to decrease risk of having angina.   CARDIAC REHAB PHASE II EXERCISE from 04/10/2020 in [facility]  Date 04/10/20  Educator DF  Instruction Review Code 1- Verbalizes Understanding  Cardiac Medications -Review what the following cardiac medications are used for, how they affect the body, and side effects that may occur when taking the medications.  Medications include Aspirin, Beta blockers, calcium channel blockers, ACE Inhibitors, angiotensin receptor blockers, diuretics, digoxin, and antihyperlipidemics.   Congestive Heart Failure -Discuss the definition of CHF, how to live with CHF, the signs and symptoms of CHF, and how keep track of weight and sodium intake.   Heart Disease and Intimacy -Discus the effect sexual activity has on the heart, how changes occur during intimacy as we age, and safety during sexual activity.   Smoking Cessation / COPD -Discuss different methods to quit smoking, the health benefits of quitting smoking, and the definition of COPD.   Nutrition I: Fats -Discuss the types of cholesterol, what cholesterol does to the heart, and how cholesterol levels can be  controlled.   Nutrition II: Labels -Discuss the different components of food labels and how to read food label   Heart Parts/Heart Disease and PAD -Discuss the anatomy of the heart, the pathway of blood circulation through the heart, and these are affected by heart disease.   Stress I: Signs and Symptoms -Discuss the causes of stress, how stress may lead to anxiety and depression, and ways to limit stress.   Stress II: Relaxation -Discuss different types of relaxation techniques to limit stress.   CARDIAC REHAB PHASE II EXERCISE from 04/10/2020 in [place]  Date 03/13/20  Educator [place]  Instruction Review Code 1- Verbalizes Understanding      Warning Signs of Stroke / TIA -Discuss definition of a stroke, what the signs and symptoms are of a stroke, and how to identify when someone is having stroke.   CARDIAC REHAB PHASE II EXERCISE from 04/10/2020 in [facility]  Date 03/20/20  Educator Etheleen Mayhew  Instruction Review Code 1- Verbalizes Understanding      Knowledge Questionnaire Score:  Knowledge Questionnaire Score - 03/06/20 1053      Knowledge Questionnaire Score   Pre Score 24/28           Core Components/Risk Factors/Patient Goals at Admission:  Personal Goals and Risk Factors at Admission - 03/06/20 1055      Core Components/Risk Factors/Patient Goals on Admission    Weight Management Yes    Intervention Weight Management: Provide education and appropriate resources to help participant work on and attain dietary goals.;Weight Management/Obesity: Establish reasonable short term and long term weight goals.;Obesity: Provide education and appropriate resources to help participant work on and attain dietary goals.    Admit Weight 251 lb 1.6 oz (113.9 kg)    Goal Weight: Short Term 246 lb (111.6 kg)    Goal Weight: Long Term 241 lb (109.3 kg)    Expected Outcomes Short Term: Continue to assess and modify interventions until  short term weight is achieved;Weight Gain: Understanding of general recommendations for a high calorie, high protein meal plan that promotes weight gain by distributing calorie intake throughout the day with the consumption for 4-5 meals, snacks, and/or supplements    Improve shortness of breath with ADL's Yes    Intervention Provide education, individualized exercise plan and daily activity instruction to help decrease symptoms of SOB with activities of daily living.    Expected Outcomes Short Term: Improve cardiorespiratory fitness to achieve a reduction of symptoms when performing ADLs;Long Term: Be able to perform more ADLs without symptoms or delay the onset of symptoms    Personal Goal Other Yes  Personal Goal Breathe better; Be able to do his ADL's; lose 10 lbs in program and 40 lbs long term.    Intervention Patient will attend cardiac rehab 3 days/week and supplement with exercise 2 days/week at home.    Expected Outcomes Patient will meet both program and personal goals.           Core Components/Risk Factors/Patient Goals Review:   Goals and Risk Factor Review    Row Name 03/20/20 1459 04/12/20 1438           Core Components/Risk Factors/Patient Goals Review   Personal Goals Review Weight Management/Obesity;Other  Lose 10 lbs in program; 40 lbs long term; be able to do his ADL's. Weight Management/Obesity;Other  Lose 10 lbs in the program; Lose 40 lbs longterm. Be able to do his ADL's.      Review Patient is new to the program completing 4 sessions maintaining his weight since his initial visit. He continues to smoke 10 cigerattes/day and says he is not interested in quitting at this time. He will continue to work toward meeting both program and personal goals. Will continue to monitor. Patient has completed 11 sessions maintaining his weight since lasts 30 day review. He is doing well in the program with progression with consistent attendance. He continues to say he is smoking 10  cigerattes/day and is not interested in [place]. He has a bronchoscopy 7/20 with a biospy on a lung nodule which came back negative. He is supposed to have a f/u CT in October. He says he feels his endurance has improved and he is feeling better overall. Will continue to monitor for progress.      Expected Outcomes Patient will continue to attend the sessions and meet both program and personal goals. Patient will continue to attend the sessions and meet both program and personal goals.             Core Components/Risk Factors/Patient Goals at Discharge (Final Review):   Goals and Risk Factor Review - 04/12/20 1438      Core Components/Risk Factors/Patient Goals Review   Personal Goals Review Weight Management/Obesity;Other   Lose 10 lbs in the program; Lose 40 lbs longterm. Be able to do his ADL's.   Review Patient has completed 11 sessions maintaining his weight since lasts 30 day review. He is doing well in the program with progression with consistent attendance. He continues to say he is smoking 10 cigerattes/day and is not interested in [place]. He has a bronchoscopy 7/20 with a biospy on a lung nodule which came back negative. He is supposed to have a f/u CT in October. He says he feels his endurance has improved and he is feeling better overall. Will continue to monitor for progress.    Expected Outcomes Patient will continue to attend the sessions and meet both program and personal goals.           ITP Comments:   Comments: ITP REVIEW Pt is making expected progress toward Cardiac Rehab goals after completing 11 sessions. Recommend continued exercise, life style modification, education, and increased stamina and strength.

## 2020-04-12 NOTE — Progress Notes (Signed)
Daily Session Note  Patient Details  Name: Daniel Ball MRN: [id number] Date of Birth: March 07, 1956 Referring Provider:     Paden Ball from 03/06/2020 in [place]  Referring Provider Dr. Harrington Ball      Encounter Date: 04/12/2020  Check In:  Session Check In - 04/12/20 0828      Check-In   Supervising physician immediately available to respond to emergencies See telemetry face sheet for immediately available MD    Location [facility]    Staff Present Daniel Ball, Exercise Physiologist;Daniel Fortunato Kris Mouton, MS, ACSM-CEP, Exercise Physiologist;Daniel Wilber Oliphant, RN, BSN    Virtual Visit No    Medication changes reported     No    Fall or balance concerns reported    No    Tobacco Cessation No Change    Current number of cigarettes/nicotine per day     10    Warm-up and Cool-down Performed as group-led instruction    Resistance Training Performed Yes    VAD Patient? No    PAD/SET Patient? No      Pain Assessment   Currently in Pain? No/denies    Multiple Pain Sites No           Capillary Blood Glucose: No results found for this or any previous visit (from the past 24 hour(s)).    Social History   Tobacco Use  Smoking Status Current Every Day Smoker  . Packs/day: 0.50  . Years: 54.00  . Pack years: 27.00  . Types: Cigarettes  Smokeless Tobacco Never Used  Tobacco Comment   10 cigarettes a day 02/28/20 ARJ    Goals Met:  [place] with exercise equipment Exercise tolerated well No report of cardiac concerns or symptoms Strength training completed today  Goals Unmet:  Not Applicable  Comments: checkout time is 0915   Dr. Kathie Ball is Medical Director for [facility] Pul[facility].

## 2020-04-15 ENCOUNTER — Other Ambulatory Visit: Payer: Self-pay

## 2020-04-15 ENCOUNTER — Ambulatory Visit: Payer: Medicare Other | Admitting: Emergency Medicine

## 2020-04-15 ENCOUNTER — Encounter (HOSPITAL_COMMUNITY)
Admission: RE | Admit: 2020-04-15 | Discharge: 2020-04-15 | Disposition: A | Discharge status: Home / Self Care | Payer: Medicare Other | Source: Ambulatory Visit | Attending: Internal Medicine | Admitting: Internal Medicine

## 2020-04-15 VITALS — Wt 252.0 lb

## 2020-04-15 DIAGNOSIS — Z955 Presence of coronary angioplasty implant and graft: Secondary | ICD-10-CM

## 2020-04-15 NOTE — Progress Notes (Signed)
Daily Session Note  Patient Details  Name: Daniel Ball MRN: [id number] Date of Birth: 05/22/1956 Referring Provider:     [place] from 03/06/2020 in [place]  Referring Provider Dr. Harrington Challenger      Encounter Date: 04/15/2020  Check In:  Session Check In - 04/15/20 0823      Check-In   Supervising physician immediately available to respond to emergencies See telemetry face sheet for immediately available MD    [facility]    Staff Present [name], Exercise Physiologist;Zilpha Mcandrew Kris Mouton, MS, ACSM-CEP, Exercise Physiologist;Carlette Wilber Oliphant, RN, BSN    Virtual Visit No    Medication changes reported     No    Fall or balance concerns reported    No    Tobacco Cessation No Change    Current number of cigarettes/nicotine per day     10    Warm-up and Cool-down Performed as group-led instruction    Resistance Training Performed Yes    VAD Patient? No    PAD/SET Patient? No      Pain Assessment   Currently in Pain? No/denies    Pain Score 0-No pain    Multiple Pain Sites No           Capillary Blood Glucose: No results found for this or any previous visit (from the past 24 hour(s)).    Social History   Tobacco Use  Smoking Status Current Every Day Smoker  . Packs/day: 0.50  . Years: 54.00  . Pack years: 27.00  . Types: Cigarettes  Smokeless Tobacco Never Used  Tobacco Comment   10 cigarettes a day 02/28/20 ARJ    Goals Met:  [place] with exercise equipment Exercise tolerated well No report of cardiac concerns or symptoms Strength training completed today  Goals Unmet:  Not Applicable  Comments: checkout time is 0915   Dr. Kathie Dike is Medical Director for [facility].

## 2020-04-17 ENCOUNTER — Other Ambulatory Visit: Payer: Self-pay

## 2020-04-17 ENCOUNTER — Encounter (HOSPITAL_COMMUNITY)
Admission: RE | Admit: 2020-04-17 | Discharge: 2020-04-17 | Disposition: A | Discharge status: Home / Self Care | Payer: Medicare Other | Source: Ambulatory Visit | Attending: Internal Medicine | Admitting: Internal Medicine

## 2020-04-17 DIAGNOSIS — F320 Major depressive disorder, single episode, mild: Secondary | ICD-10-CM | POA: Diagnosis not present

## 2020-04-17 DIAGNOSIS — Z955 Presence of coronary angioplasty implant and graft: Secondary | ICD-10-CM | POA: Diagnosis not present

## 2020-04-17 DIAGNOSIS — I10 Essential (primary) hypertension: Secondary | ICD-10-CM | POA: Diagnosis not present

## 2020-04-17 DIAGNOSIS — E782 Mixed hyperlipidemia: Secondary | ICD-10-CM | POA: Diagnosis not present

## 2020-04-17 DIAGNOSIS — J449 Chronic obstructive pulmonary disease, unspecified: Secondary | ICD-10-CM | POA: Diagnosis not present

## 2020-04-17 DIAGNOSIS — I4891 Unspecified atrial fibrillation: Secondary | ICD-10-CM | POA: Diagnosis not present

## 2020-04-17 DIAGNOSIS — N400 Enlarged prostate without lower urinary tract symptoms: Secondary | ICD-10-CM | POA: Diagnosis not present

## 2020-04-17 DIAGNOSIS — I480 Paroxysmal atrial fibrillation: Secondary | ICD-10-CM | POA: Diagnosis not present

## 2020-04-17 DIAGNOSIS — I25118 Atherosclerotic heart disease of native coronary artery with other forms of angina pectoris: Secondary | ICD-10-CM | POA: Diagnosis not present

## 2020-04-17 NOTE — Progress Notes (Signed)
Daily Session Note  Patient Details  Name: Daniel Ball MRN: [id number] Date of Birth: 1956-05-28 Referring Provider:     CARDIAC REHAB PHASE II ORIENTATION from 03/06/2020 in [place]  Referring Provider Dr. Harrington Challenger      Encounter Date: 04/17/2020  Check In:  Session Check In - 04/17/20 0825      Check-In   Supervising physician immediately available to respond to emergencies See telemetry face sheet for immediately available MD    [facility]    Staff Present [name], Exercise Physiologist;Kessie Croston Kris Mouton, MS, ACSM-CEP, Exercise Physiologist;Carlette Wilber Oliphant, RN, BSN    Virtual Visit No    Medication changes reported     No    Fall or balance concerns reported    No    Tobacco Cessation No Change    Current number of cigarettes/nicotine per day     10    Warm-up and Cool-down Performed as group-led instruction    Resistance Training Performed Yes    VAD Patient? No    PAD/SET Patient? No      Pain Assessment   Currently in Pain? No/denies    Pain Score 0-No pain    Multiple Pain Sites No           Capillary Blood Glucose: No results found for this or any previous visit (from the past 24 hour(s)).    Social History   Tobacco Use  Smoking Status Current Every Day Smoker  . Packs/day: 0.50  . Years: 54.00  . Pack years: 27.00  . Types: Cigarettes  Smokeless Tobacco Never Used  Tobacco Comment   10 cigarettes a day 02/28/20 ARJ    Goals Met:  [place] with exercise equipment Exercise tolerated well No report of cardiac concerns or symptoms Strength training completed today  Goals Unmet:  Not Applicable  Comments: checkout time is 0915   Dr. Kathie Dike is Medical Director for [facility]monary Re[facility].

## 2020-04-18 DIAGNOSIS — R911 Solitary pulmonary nodule: Secondary | ICD-10-CM | POA: Diagnosis not present

## 2020-04-19 ENCOUNTER — Other Ambulatory Visit: Payer: Self-pay

## 2020-04-19 ENCOUNTER — Encounter (HOSPITAL_COMMUNITY)
Admission: RE | Admit: 2020-04-19 | Discharge: 2020-04-19 | Disposition: A | Discharge status: Home / Self Care | Payer: Medicare Other | Source: Ambulatory Visit | Attending: Internal Medicine | Admitting: Internal Medicine

## 2020-04-19 DIAGNOSIS — Z955 Presence of coronary angioplasty implant and graft: Secondary | ICD-10-CM

## 2020-04-19 NOTE — Progress Notes (Signed)
Incomplete Session Note  Patient Details  Name: Daniel Ball MRN: [id number] Date of Birth: Jul 26, 1956 Referring Provider:     CARDIAC REHAB PHASE II ORIENTATION from 03/06/2020 in [place]  Referring Provider Dr. Wilnette Kales did not complete his rehab session. Pt in today for exercise feeling fatigued and short of breath. Pt participated in warm up with weights and bands.  Pt exercised on the treadmill 2 minutes and complained of feeling short of breath and reported that when he woke up this morning he didn;t feel good.  Pt took all of his medications except his fluid pill.  Pt moved to the nustep to see if he could tolerate continuing with exercise.  Pt attempted to but felt his shortness of breath was too much.to continue with exercise. Pt has COPD and continues to smoke.  Pt did use his inhaler today for exercise.  Noted to be in afib with elevated rate when active.  Bp 124/70, lungs diminshed.  Pt felt able to drive himself home. Planned to take it easy and stay cool. Cherre Huger, BSN Cardiac and Training and development officer

## 2020-04-19 NOTE — Progress Notes (Deleted)
Daily Session Note  Patient Details  Name: Daniel Ball MRN: [id number] Date of Birth: 04/25/1956 Referring Provider:     CARDIAC REHAB PHASE II ORIENTATION from 03/06/2020 in [place]  Referring Provider Dr. Harrington Challenger      Encounter Date: 04/19/2020  Check In:  Session Check In - 04/19/20 0813      Check-In   Supervising physician immediately available to respond to emergencies See telemetry face sheet for immediately available MD    [facility]    Staff Present [name], Exercise Physiologist;Eeva Schlosser Kris Mouton, MS, ACSM-CEP, Exercise Physiologist;Carlette Wilber Oliphant, RN, BSN    Virtual Visit No    Medication changes reported     No    Fall or balance concerns reported    No    Tobacco Cessation No Change    Current number of cigarettes/nicotine per day     10    Warm-up and Cool-down Performed as group-led instruction    Resistance Training Performed Yes    VAD Patient? No    PAD/SET Patient? No      Pain Assessment   Currently in Pain? No/denies    Pain Score 0-No pain    Multiple Pain Sites No           Capillary Blood Glucose: No results found for this or any previous visit (from the past 24 hour(s)).    Social History   Tobacco Use  Smoking Status Current Every Day Smoker   Packs/day: 0.50   Years: 54.00   Pack years: 27.00   Types: Cigarettes  Smokeless Tobacco Never Used  Tobacco Comment   10 cigarettes a day 02/28/20 ARJ    Goals Met:  [place] with exercise equipment Exercise tolerated well No report of cardiac concerns or symptoms Strength training completed today  Goals Unmet:  Not Applicable  Comments: checkout time is 0915   Dr. Kathie Dike is Medical Director for [facility] Pulmonary Rehab.

## 2020-04-20 ENCOUNTER — Ambulatory Visit
Admission: EM | Admit: 2020-04-20 | Discharge: 2020-04-20 | Disposition: A | Discharge status: Home / Self Care | Payer: Medicare Other | Attending: Physician Assistant | Admitting: Physician Assistant

## 2020-04-20 ENCOUNTER — Encounter: Payer: Self-pay | Admitting: Physician Assistant

## 2020-04-20 DIAGNOSIS — M5416 Radiculopathy, lumbar region: Secondary | ICD-10-CM

## 2020-04-20 MED ORDER — CYCLOBENZAPRINE HCL 10 MG PO TABS: 10.0000 mg | ORAL_TABLET | Freq: Three times a day (TID) | ORAL | 0 refills | Status: AC | PRN

## 2020-04-20 MED ORDER — PREDNISONE 10 MG (48) PO TBPK: ORAL_TABLET | ORAL | 0 refills | Status: DC

## 2020-04-20 NOTE — Discharge Instructions (Addendum)
You appear to have an irritated nerve in your back. Treat with pred pack and flexeril as needed. Avoid twist or turns. Gentle exercises for your back as with our surgery will help. Make a f/u with Dr. Tonita Cong

## 2020-04-20 NOTE — ED Provider Notes (Signed)
[facility]    CSN: [id number] Arrival date & time: 04/20/20  1345      History   Chief Complaint No chief complaint on file.   HPI Daniel Ball is a 64 y.o. male.   Who presents with left buttock pain. Onset 1 day. No known injury. History of lumbar surgery. Pain radiates to right thigh. No weakness or parasthenia. No weakness. Pain with sitting and stianding.      Past Medical History:  Diagnosis Date  . Arthritis   . Atrial fibrillation ([place])   . COPD (chronic obstructive pulmonary disease) ([place])   . Coronary artery disease    a. 12/2019: s/p orbital atherectomy and DES placement to proximal/mid LAD.   [name] GERD (gastroesophageal reflux disease)   . Hyperlipidemia   . Hypertension    hx HBP - MEDS DC'D 10 YRS since BP has been in normal range  . Impingement syndrome of shoulder    left  . Pre-diabetes   . Shortness of breath    with exertion  . Sleep apnea    couldnt tolerate CPAP  . Spinal stenosis     Patient Active Problem List   Diagnosis Date Noted  . Mediastinal lymphadenopathy   . ILD (interstitial lung disease) ([place]) 02/28/2020  . OSA (obstructive sleep apnea) 02/28/2020  . Coronary artery disease of native artery of native heart with stable angina pectoris ([place]) 01/18/2020  . Accelerating angina ([place]) 11/02/2019  . Pulmonary nodule 03/19/2019  . NSIP (nonspecific interstitial pneumonia) ([place]) 07/20/2016  . Morbid (severe) obesity due to excess calories ([place]) 01/31/2016  . COPD 0/ still smoking  01/30/2016  . Cough 07/03/2014  . Neurogenic claudication due to lumbar spinal stenosis 02/07/2014  . Dyspnea on exertion 01/09/2014  . Cigarette smoker 05/02/2010  . Asbestos exposure 05/02/2010  . Essential hypertension 04/30/2010  . CAD 04/30/2010  . Unspecified chronic bronchitis ([place]) 04/30/2010  . GERD 04/30/2010    Past Surgical History:  Procedure Laterality Date  . BRONCHIAL BIOPSY  04/09/2020   Procedure: BRONCHIAL BIOPSIES;   Surgeon: Collene Gobble, MD;  Location: [facility] ENDOSCOPY;  Service: Pulmonary;;  . BRONCHIAL BRUSHINGS  04/09/2020   Procedure: BRONCHIAL BRUSHINGS;  Surgeon: Collene Gobble, MD;  Location: [facility] ENDOSCOPY;  Service: Pulmonary;;  . BRONCHIAL NEEDLE ASPIRATION BIOPSY  04/09/2020   Procedure: BRONCHIAL NEEDLE ASPIRATION BIOPSIES;  Surgeon: Collene Gobble, MD;  Location: [place];  Service: Pulmonary;;  . BRONCHIAL WASHINGS  04/09/2020   Procedure: BRONCHIAL WASHINGS;  Surgeon: Collene Gobble, MD;  Location: [facility] ENDOSCOPY;  Service: Pulmonary;;  . CORONARY ATHERECTOMY N/A 01/18/2020   Procedure: CORONARY ATHERECTOMY;  Surgeon: Nelva Bush, MD;  Location: [place] CV LAB;  Service: Cardiovascular;  Laterality: N/A;  . CORONARY STENT INTERVENTION  01/18/2020  . CORONARY STENT INTERVENTION N/A 01/18/2020   Procedure: CORONARY STENT INTERVENTION;  Surgeon: Nelva Bush, MD;  Location: [place] CV LAB;  Service: Cardiovascular;  Laterality: N/A;  . EYE SURGERY Left   . FIDUCIAL MARKER PLACEMENT  04/09/2020   Procedure: FIDUCIAL MARKER PLACEMENT;  Surgeon: Collene Gobble, MD;  Location: [facility] ENDOSCOPY;  Service: Pulmonary;;  . FINE NEEDLE ASPIRATION  04/09/2020   Procedure: FINE NEEDLE ASPIRATION (FNA) LINEAR;  Surgeon: Collene Gobble, MD;  Location: [place] ENDOSCOPY;  Service: Pulmonary;;  . HAND SURGERY Right    thumb  . INTRAVASCULAR PRESSURE WIRE/FFR STUDY N/A 11/02/2019   Procedure: INTRAVASCULAR PRESSURE WIRE/FFR STUDY;  Surgeon: Nelva Bush, MD;  Location: [facility]  INVASIVE CV LAB;  Service: Cardiovascular;  Laterality: N/A;  . INTRAVASCULAR ULTRASOUND/IVUS N/A 01/18/2020   Procedure: Intravascular Ultrasound/IVUS;  Surgeon: Nelva Bush, MD;  Location: [place] CV LAB;  Service: Cardiovascular;  Laterality: N/A;  . LUMBAR LAMINECTOMY/DECOMPRESSION MICRODISCECTOMY N/A 02/07/2014   Procedure: LUMBAR DECOMPRESSION Lumbar one-Lumbar five;  Surgeon: [name], MD;  Location: [facility] ORS;   Service: Orthopedics;  Laterality: N/A;  . RIGHT/LEFT HEART CATH AND CORONARY ANGIOGRAPHY N/A 11/02/2019   Procedure: RIGHT/LEFT HEART CATH AND CORONARY ANGIOGRAPHY;  Surgeon: Nelva Bush, MD;  Location: [place] CV LAB;  Service: Cardiovascular;  Laterality: N/A;  . SHOULDER ARTHROSCOPY WITH SUBACROMIAL DECOMPRESSION Left 04/12/2014   Procedure: LEFT SHOULDER ARTHROSCOPY WITH SUBACROMIAL DECOMPRESSION AND LABRAL DEBRIDEMENT, ROTATOR CUFF DEBRIDEMENT, BICEPS DEBRIDEMENT;  Surgeon: [name], MD;  Location: [facility] ORS;  Service: Orthopedics;  Laterality: Left;  [name] VIDEO BRONCHOSCOPY WITH ENDOBRONCHIAL NAVIGATION N/A 04/09/2020   Procedure: VIDEO BRONCHOSCOPY WITH ENDOBRONCHIAL NAVIGATION;  Surgeon: Collene Gobble, MD;  Location: [place] ENDOSCOPY;  Service: Pulmonary;  Laterality: N/A;  . VIDEO BRONCHOSCOPY WITH ENDOBRONCHIAL ULTRASOUND N/A 04/09/2020   Procedure: VIDEO BRONCHOSCOPY WITH ENDOBRONCHIAL ULTRASOUND;  Surgeon: Collene Gobble, MD;  Location: [place] ENDOSCOPY;  Service: Pulmonary;  Laterality: N/A;  . VIDEO BRONCHOSCOPY WITH RADIAL ENDOBRONCHIAL ULTRASOUND  04/09/2020   Procedure: VIDEO BRONCHOSCOPY WITH RADIAL ENDOBRONCHIAL ULTRASOUND;  Surgeon: Collene Gobble, MD;  Location: [place] ENDOSCOPY;  Service: Pulmonary;;       Home Medications    Prior to Admission medications   Medication Sig Start Date End Date Taking? Authorizing Provider  albuterol (VENTOLIN HFA) 108 (90 Base) MCG/ACT inhaler Inhale 2 puffs into the lungs every 4 (four) hours as needed for wheezing or shortness of breath. Patient taking differently: Inhale 2 puffs into the lungs daily.  01/05/20   Tanda Rockers, MD  amLODipine (NORVASC) 5 MG tablet Take 1.5 tablets (7.5 mg total) by mouth daily. 11/30/19   Fay Records, MD  apixaban (ELIQUIS) 5 MG TABS tablet Take 1 tablet (5 mg total) by mouth 2 (two) times daily. Okay to restart this medication on 7/22 04/09/20   Collene Gobble, MD  buPROPion (WELLBUTRIN XL) 300 MG 24  hr tablet Take 300 mg by mouth daily.    [provider]  clopidogrel (PLAVIX) 75 MG tablet Take 1 tablet (75 mg total) by mouth daily with breakfast. Okay to restart this medication on 7/22 04/09/20   Collene Gobble, MD  cyclobenzaprine (FLEXERIL) 10 MG tablet Take 10 mg by mouth 3 (three) times daily as needed for muscle spasms.    [provider]  furosemide (LASIX) 40 MG tablet Take 1 tablet (40 mg total) by mouth daily as needed (for edema or weight gain.). 02/02/20   Strader, Fransisco Hertz, PA-C  isosorbide mononitrate (IMDUR) 60 MG 24 hr tablet Take 60 mg by mouth daily.    [provider]  metoprolol succinate (TOPROL XL) 25 MG 24 hr tablet Take 1 tablet (25 mg total) by mouth daily. 04/04/20 06/03/20  Fay Records, MD  nitroGLYCERIN (NITROSTAT) 0.4 MG SL tablet Place 1 tablet (0.4 mg total) under the tongue every 5 (five) minutes as needed for chest pain. 11/02/19 11/01/20  End, Harrell Gave, MD  pantoprazole (PROTONIX) 40 MG tablet Take 1 tablet (40 mg total) by mouth daily. 01/20/20   Kathyrn Drown D, NP  potassium chloride SA (KLOR-CON) 20 MEQ tablet Take 1 tablet (20 mEq total) by mouth daily as needed (take on  the days you take Lasix.). 02/02/20   Strader, Fransisco Hertz, PA-C  rosuvastatin (CRESTOR) 40 MG tablet Take 1 tablet (40 mg total) by mouth daily. 02/13/20   Fay Records, MD  valsartan (DIOVAN) 320 MG tablet Take 320 mg by mouth daily.    [provider]    Family History Family History  Family history unknown: Yes    Social History Social History   Tobacco Use  . Smoking status: Current Every Day Smoker    Packs/day: 0.50    Years: 54.00    Pack years: 27.00    Types: Cigarettes  . Smokeless tobacco: Never Used  . Tobacco comment: 10 cigarettes a day 02/28/20 ARJ  Vaping Use  . Vaping Use: Never used  Substance Use Topics  . Alcohol use: No  . Drug use: No     Allergies   Patient has no known allergies.   Review of Systems Review  of Systems  All other systems reviewed and are negative.    Physical Exam Triage Vital Signs ED Triage Vitals  Enc Vitals Group     BP      Pulse      Resp      Temp      Temp src      SpO2      Weight      Height      Head Circumference      Peak Flow      Pain Score      Pain Loc      Pain Edu?      Excl. in [place]?    No data found.  Updated Vital Signs There were no vitals taken for this visit.  Visual Acuity Right Eye Distance:   Left Eye Distance:   Bilateral Distance:    Right Eye Near:   Left Eye Near:    Bilateral Near:     Physical Exam Vitals and nursing note reviewed.  Constitutional:      General: He is not in acute distress.    Appearance: Normal appearance. He is obese. He is not ill-appearing.  Musculoskeletal:        General: No swelling or tenderness.     Comments: Pain with ROM of lumbar flexion and extension, no pain to palpation of lateral hip or buttock region  Skin:    General: Skin is warm and dry.     Findings: Rash present.     Comments: Psoriasis to left elbow  Neurological:     General: No focal deficit present.     Mental Status: He is alert.     Motor: No weakness.     Coordination: Coordination normal.     Gait: Gait normal.  Psychiatric:        Mood and Affect: Mood normal.      UC Treatments / Results  Labs (all labs ordered are listed, but only abnormal results are displayed) Labs Reviewed - No data to display  EKG   Radiology No results found.  Procedures Procedures (including critical care time)  Medications Ordered in UC Medications - No data to display  Initial Impression / Assessment and Plan / UC Course  I have reviewed the triage vital signs and the nursing notes.  Pertinent labs & imaging results that were available during my care of the patient were reviewed by me and considered in my medical decision making (see chart for details).     Treat with prednisone pack and  flexeril. Gentle ROM  exercises. FU with Dr. Tonita Cong.  Final Clinical Impressions(s) / UC Diagnoses   Final diagnoses:  None   Discharge Instructions   None    ED Prescriptions    None     PDMP not reviewed this encounter.   Bjorn Pippin, PA-C 04/20/20 1416

## 2020-04-20 NOTE — ED Triage Notes (Signed)
Chronic bilateral hip pain since last night, LT hip pain since last night, denies injury

## 2020-04-22 ENCOUNTER — Encounter (HOSPITAL_COMMUNITY): Payer: Medicare Other

## 2020-04-23 DIAGNOSIS — M5136 Other intervertebral disc degeneration, lumbar region: Secondary | ICD-10-CM | POA: Diagnosis not present

## 2020-04-23 DIAGNOSIS — M545 Low back pain: Secondary | ICD-10-CM | POA: Diagnosis not present

## 2020-04-24 ENCOUNTER — Telehealth (HOSPITAL_COMMUNITY): Payer: Self-pay

## 2020-04-24 ENCOUNTER — Encounter (HOSPITAL_COMMUNITY): Payer: Medicare Other

## 2020-04-24 NOTE — Progress Notes (Signed)
Cardiac Individual Treatment Plan  Patient Details  Name: Daniel Ball MRN: [id number] Date of Birth: [birth year] Referring Provider:     [facility] from 03/06/2020 in [place]  Referring Provider Dr. Harrington Challenger      Initial Encounter Date:    CARDIAC REHAB PHASE II ORIENTATION from 03/06/2020 in [place]  Date 03/06/20      Visit Diagnosis: Status post coronary artery stent placement  Patient's Home Medications on Admission:  Current Outpatient Medications:  .  albuterol (VENTOLIN HFA) 108 (90 Base) MCG/ACT inhaler, Inhale 2 puffs into the lungs every 4 (four) hours as needed for wheezing or shortness of breath. (Patient taking differently: Inhale 2 puffs into the lungs daily. ), Disp: 18 g, Rfl: 11 .  amLODipine (NORVASC) 5 MG tablet, Take 1.5 tablets (7.5 mg total) by mouth daily., Disp: 135 tablet, Rfl: 3 .  apixaban (ELIQUIS) 5 MG TABS tablet, Take 1 tablet (5 mg total) by mouth 2 (two) times daily. Okay to restart this medication on 7/22, Disp: 60 tablet, Rfl: 5 .  buPROPion (WELLBUTRIN XL) 300 MG 24 hr tablet, Take 300 mg by mouth daily., Disp: , Rfl:  .  clopidogrel (PLAVIX) 75 MG tablet, Take 1 tablet (75 mg total) by mouth daily with breakfast. Okay to restart this medication on 7/22, Disp: 90 tablet, Rfl: 3 .  cyclobenzaprine (FLEXERIL) 10 MG tablet, Take 1 tablet (10 mg total) by mouth 3 (three) times daily as needed for up to 10 days for muscle spasms., Disp: 30 tablet, Rfl: 0 .  furosemide (LASIX) 40 MG tablet, Take 1 tablet (40 mg total) by mouth daily as needed (for edema or weight gain.)., Disp: 30 tablet, Rfl: 5 .  isosorbide mononitrate (IMDUR) 60 MG 24 hr tablet, Take 60 mg by mouth daily., Disp: , Rfl:  .  metoprolol succinate (TOPROL XL) 25 MG 24 hr tablet, Take 1 tablet (25 mg total) by mouth daily., Disp: 90 tablet, Rfl: 1 .  nitroGLYCERIN (NITROSTAT) 0.4 MG SL tablet, Place 1 tablet (0.4 mg  total) under the tongue every 5 (five) minutes as needed for chest pain., Disp: 25 tablet, Rfl: prn .  pantoprazole (PROTONIX) 40 MG tablet, Take 1 tablet (40 mg total) by mouth daily., Disp: 60 tablet, Rfl: 2 .  potassium chloride SA (KLOR-CON) 20 MEQ tablet, Take 1 tablet (20 mEq total) by mouth daily as needed (take on the days you take Lasix.)., Disp: 30 tablet, Rfl: 5 .  predniSONE (STERAPRED UNI-PAK 48 TAB) 10 MG (48) TBPK tablet, Take as directed x 10 days, Disp: 48 tablet, Rfl: 0 .  rosuvastatin (CRESTOR) 40 MG tablet, Take 1 tablet (40 mg total) by mouth daily., Disp: 90 tablet, Rfl: 3 .  valsartan (DIOVAN) 320 MG tablet, Take 320 mg by mouth daily., Disp: , Rfl:   Past Medical History: Past Medical History:  Diagnosis Date  . Arthritis   . Atrial fibrillation ([place])   . COPD (chronic obstructive pulmonary disease) ([place])   . Coronary artery disease    a. 12/2019: s/p orbital atherectomy and DES placement to proximal/mid LAD.   [name] GERD (gastroesophageal reflux disease)   . Hyperlipidemia   . Hypertension    hx HBP - MEDS DC'D 10 YRS since BP has been in normal range  . Impingement syndrome of shoulder    left  . Pre-diabetes   . Shortness of breath    with exertion  . Sleep apnea  couldnt tolerate CPAP  . Spinal stenosis     Tobacco Use: Social History   Tobacco Use  Smoking Status Current Every Day Smoker  . Packs/day: 0.50  . Years: 54.00  . Pack years: 27.00  . Types: Cigarettes  Smokeless Tobacco Never Used  Tobacco Comment   10 cigarettes a day 02/28/20 [facility]    Labs: Recent Review Flowsheet Data    Labs for ITP Cardiac and Pulmonary Rehab Latest Ref Rng & Units 11/02/2019 11/02/2019 01/19/2020   Cholestrol 0 - 200 mg/dL - - 142   LDLCALC 0 - 99 mg/dL - - 78   HDL >40 mg/dL - - 36(L)   Trlycerides <150 mg/dL - - 139   PHART 7.35 - 7.45 - 7.365 -   PCO2ART 32 - 48 mmHg - 51.5(H) -   HCO3 20.0 - 28.0 mmol/L 27.1 29.4(H) -   TCO2 22 - 32 mmol/L 29 31 -   O2SAT  % 67.0 99.0 -      Capillary Blood Glucose: Lab Results  Component Value Date   GLUCAP 128 (H) 04/09/2020   GLUCAP 128 (H) 04/09/2020   GLUCAP 143 (H) 04/09/2020     Exercise Target Goals: Exercise Program Goal: Individual exercise prescription set using results from initial 6 min walk test and THRR while considering  patient's activity barriers and safety.   Exercise Prescription Goal: Starting with aerobic activity 30 plus minutes a day, 3 days per week for initial exercise prescription. Provide home exercise prescription and guidelines that participant acknowledges understanding prior to discharge.  Activity Barriers & Risk Stratification:  Activity Barriers & Cardiac Risk Stratification - 03/06/20 1150      Activity Barriers & Cardiac Risk Stratification   Activity Barriers Deconditioning;Chest Pain/Angina;Other (comment)    Comments Bilateral hip pain    Cardiac Risk Stratification High           6 Minute Walk:  6 Minute Walk    Row Name 03/06/20 1120         6 Minute Walk   Phase Initial     Distance 1000 feet     Walk Time 6 minutes     # of Rest Breaks 0     MPH 1.89     METS 2.45     RPE 13     Perceived Dyspnea  14     VO2 Peak 6.13     Symptoms Yes (comment)     Comments Bilateral hip pain 6/10 and c/o of chest burning 3/10 after walking.     Resting HR 73 bpm     Resting BP 110/60     Resting Oxygen Saturation  95 %     Exercise Oxygen Saturation  during 6 min walk 97 %     Max Ex. HR 81 bpm     Max Ex. BP 150/80     2 Minute Post BP 120/80            Oxygen Initial Assessment:   Oxygen Re-Evaluation:   Oxygen Discharge (Final Oxygen Re-Evaluation):   Initial Exercise Prescription:  Initial Exercise Prescription - 03/06/20 1100      Date of Initial Exercise RX and Referring Provider   Date 03/06/20    Referring Provider Dr. Harrington Challenger    Expected Discharge Date 06/06/20      Treadmill   MPH 1.1    Grade 0    Minutes 17    METs  1.8      T5  Nustep   Level 1    SPM 62    Minutes 22    METs 2      Prescription Details   Frequency (times per week) 3    Duration Progress to 30 minutes of continuous aerobic without signs/symptoms of physical distress      Intensity   THRR 40-80% of Max Heartrate [id number]    Ratings of Perceived Exertion 11-13    Perceived Dyspnea 0-4      Resistance Training   Training Prescription Yes    Weight 1    Reps 10-15           Perform Capillary Blood Glucose checks as needed.  Exercise Prescription Changes:  Exercise Prescription Changes    Row Name 03/13/20 1134 04/01/20 1400 04/10/20 1000 04/15/20 1200       Response to Exercise   Blood Pressure (Admit) 132/64 114/72 -- 140/76    Blood Pressure (Exercise) 138/70 148/64 -- 154/82    Blood Pressure (Exit) 118/64 110/70 -- 132/82    Heart Rate (Admit) 64 bpm 68 bpm -- 78 bpm    Heart Rate (Exercise) 84 bpm 91 bpm -- 93 bpm    Heart Rate (Exit) 78 bpm 80 bpm -- 86 bpm    Rating of Perceived Exertion (Exercise) 11 13 -- 11    Duration Continue with 30 min of aerobic exercise without signs/symptoms of physical distress. Progress to 30 minutes of  aerobic without signs/symptoms of physical distress -- Continue with 30 min of aerobic exercise without signs/symptoms of physical distress.    Intensity THRR unchanged THRR unchanged -- THRR unchanged      Progression   Progression Continue to progress workloads to maintain intensity without signs/symptoms of physical distress. Continue to progress workloads to maintain intensity without signs/symptoms of physical distress. -- Continue to progress workloads to maintain intensity without signs/symptoms of physical distress.      Resistance Training   Training Prescription Yes Yes -- Yes    Weight 2 lbs 3 -- 3    Reps 10-15 10-15 -- 10-15      Treadmill   MPH 1.1 1.5 -- 1.7    Grade 0 0 -- 0    Minutes 17 17 -- 17    METs 1.8 2.15 -- 2.3      T5 Nustep   Level 1 1 -- 2     SPM 111 122 -- 141    Minutes 22 22 -- 22    METs 2.1 2.3 -- 2.3      Home Exercise Plan   Plans to continue exercise at -- -- Home (comment) --    Frequency -- -- Add 2 additional days to program exercise sessions. --    Initial Home Exercises Provided -- -- 04/10/20 --           Exercise Comments:  Exercise Comments    Row Name 03/06/20 1140 03/11/20 1004 03/20/20 1434 04/10/20 1036     Exercise Comments Patient stopped at five minutes during walk test due to hip pain 6/10 which started to compromised his gait. He also c/o of his chest burning 3/10 after walking. Patient completed his first day of exercise in cardiac rehab. He complained of hip pain while on the treadmill (7/10 R hip and 4/10 in L hip). Otherwise he tolerated exercise well. Patient is fairly new to the program and has been preforming well so far. Home Exercise Reviewed  Exercise Goals and Review:  Exercise Goals    Row Name 03/06/20 1134 04/12/20 1159 04/22/20 1403         Exercise Goals   Increase Physical Activity Yes Yes Yes     Intervention Provide advice, education, support and counseling about physical activity/exercise needs.;Develop an individualized exercise prescription for aerobic and resistive training based on initial evaluation findings, risk stratification, comorbidities and participant's personal goals. Provide advice, education, support and counseling about physical activity/exercise needs.;Develop an individualized exercise prescription for aerobic and resistive training based on initial evaluation findings, risk stratification, comorbidities and participant's personal goals. Provide advice, education, support and counseling about physical activity/exercise needs.;Develop an individualized exercise prescription for aerobic and resistive training based on initial evaluation findings, risk stratification, comorbidities and participant's personal goals.     Expected Outcomes Short Term:  Attend rehab on a regular basis to increase amount of physical activity.;Long Term: Add in home exercise to make exercise part of routine and to increase amount of physical activity. Short Term: Attend rehab on a regular basis to increase amount of physical activity.;Long Term: Add in home exercise to make exercise part of routine and to increase amount of physical activity.;Long Term: Exercising regularly at least 3-5 days a week. Short Term: Attend rehab on a regular basis to increase amount of physical activity.;Long Term: Add in home exercise to make exercise part of routine and to increase amount of physical activity.;Long Term: Exercising regularly at least 3-5 days a week.     Increase Strength and Stamina Yes Yes Yes     Intervention Provide advice, education, support and counseling about physical activity/exercise needs.;Develop an individualized exercise prescription for aerobic and resistive training based on initial evaluation findings, risk stratification, comorbidities and participant's personal goals. Provide advice, education, support and counseling about physical activity/exercise needs.;Develop an individualized exercise prescription for aerobic and resistive training based on initial evaluation findings, risk stratification, comorbidities and participant's personal goals. Provide advice, education, support and counseling about physical activity/exercise needs.;Develop an individualized exercise prescription for aerobic and resistive training based on initial evaluation findings, risk stratification, comorbidities and participant's personal goals.     Expected Outcomes Long Term: Improve cardiorespiratory fitness, muscular endurance and strength as measured by increased METs and functional capacity (6MWT);Short Term: Perform resistance training exercises routinely during rehab and add in resistance training at home Short Term: Increase workloads from initial exercise prescription for resistance,  speed, and METs.;Short Term: Perform resistance training exercises routinely during rehab and add in resistance training at home;Long Term: Improve cardiorespiratory fitness, muscular endurance and strength as measured by increased METs and functional capacity (6MWT) Short Term: Increase workloads from initial exercise prescription for resistance, speed, and METs.;Short Term: Perform resistance training exercises routinely during rehab and add in resistance training at home;Long Term: Improve cardiorespiratory fitness, muscular endurance and strength as measured by increased METs and functional capacity (6MWT)     Able to understand and use rate of perceived exertion (RPE) scale Yes Yes Yes     Intervention Provide education and explanation on how to use RPE scale Provide education and explanation on how to use RPE scale Provide education and explanation on how to use RPE scale     Expected Outcomes Short Term: Able to use RPE daily in rehab to express subjective intensity level;Long Term:  Able to use RPE to guide intensity level when exercising independently Short Term: Able to use RPE daily in rehab to express subjective intensity level;Long Term:  Able to use RPE  to guide intensity level when exercising independently Short Term: Able to use RPE daily in rehab to express subjective intensity level;Long Term:  Able to use RPE to guide intensity level when exercising independently     Able to understand and use Dyspnea scale Yes -- --     Intervention Provide education and explanation on how to use Dyspnea scale -- --     Expected Outcomes Short Term: Able to use Dyspnea scale daily in rehab to express subjective sense of shortness of breath during exertion;Long Term: Able to use Dyspnea scale to guide intensity level when exercising independently -- --     Knowledge and understanding of Target Heart Rate Range (THRR) Yes Yes Yes     Intervention Provide education and explanation of THRR including how the  numbers were predicted and where they are located for reference Provide education and explanation of THRR including how the numbers were predicted and where they are located for reference Provide education and explanation of THRR including how the numbers were predicted and where they are located for reference     Expected Outcomes Long Term: Able to use THRR to govern intensity when exercising independently;Short Term: Able to state/look up THRR Short Term: Able to state/look up THRR;Short Term: Able to use daily as guideline for intensity in rehab;Long Term: Able to use THRR to govern intensity when exercising independently Short Term: Able to state/look up THRR;Short Term: Able to use daily as guideline for intensity in rehab;Long Term: Able to use THRR to govern intensity when exercising independently     Able to check pulse independently Yes Yes Yes     Intervention Provide education and demonstration on how to check pulse in carotid and radial arteries.;Review the importance of being able to check your own pulse for safety during independent exercise Provide education and demonstration on how to check pulse in carotid and radial arteries.;Review the importance of being able to check your own pulse for safety during independent exercise Provide education and demonstration on how to check pulse in carotid and radial arteries.;Review the importance of being able to check your own pulse for safety during independent exercise     Expected Outcomes Short Term: Able to explain why pulse checking is important during independent exercise;Long Term: Able to check pulse independently and accurately Short Term: Able to explain why pulse checking is important during independent exercise;Long Term: Able to check pulse independently and accurately Short Term: Able to explain why pulse checking is important during independent exercise;Long Term: Able to check pulse independently and accurately     Understanding of  Exercise Prescription Yes Yes Yes     Intervention Provide education, explanation, and written materials on patient's individual exercise prescription Provide education, explanation, and written materials on patient's individual exercise prescription Provide education, explanation, and written materials on patient's individual exercise prescription     Expected Outcomes Short Term: Able to explain program exercise prescription;Long Term: Able to explain home exercise prescription to exercise independently Short Term: Able to explain program exercise prescription;Long Term: Able to explain home exercise prescription to exercise independently Short Term: Able to explain program exercise prescription;Long Term: Able to explain home exercise prescription to exercise independently            Exercise Goals Re-Evaluation :  Exercise Goals Re-Evaluation    [place] Name 04/12/20 1047 04/22/20 1404           Exercise Goal Re-Evaluation   Exercise Goals Review Increase Physical Activity;Increase Strength  and Stamina;Able to understand and use rate of perceived exertion (RPE) scale;Knowledge and understanding of Target Heart Rate Range (THRR);Able to check pulse independently;Understanding of Exercise Prescription Increase Physical Activity;Increase Strength and Stamina;Able to understand and use rate of perceived exertion (RPE) scale;Knowledge and understanding of Target Heart Rate Range (THRR);Able to check pulse independently;Understanding of Exercise Prescription      Comments Patient has attended 10 exercise sessions. Patient is still smoking 10 cigarette per day even though he has been counciled on quitting. Patient feels his endurance is progressing and his met levels are increasing as well. Patient is exercising at 2.3 mets on the stepper. Will continue to monitor and progress as able. Patient has completed 13 exercise sessions. He is still skoking about 10 cigarettes per day. He was unable to complete his  last exercise session due to unusual shortness of breath. He also has pinched a nerve in his lower back and will be absent from exercise this week. He had been progressing well in the program before these set backs. Pt is exercising at 2.4 METs on the stepper. Will continue to monitor and progress as able.      Expected Outcomes Reach goals and increase stamia Reach goals and increase stamina.              Discharge Exercise Prescription (Final Exercise Prescription Changes):  Exercise Prescription Changes - 04/15/20 1200      Response to Exercise   Blood Pressure (Admit) 140/76    Blood Pressure (Exercise) 154/82    Blood Pressure (Exit) 132/82    Heart Rate (Admit) 78 bpm    Heart Rate (Exercise) 93 bpm    Heart Rate (Exit) 86 bpm    Rating of Perceived Exertion (Exercise) 11    Duration Continue with 30 min of aerobic exercise without signs/symptoms of physical distress.    Intensity THRR unchanged      Progression   Progression Continue to progress workloads to maintain intensity without signs/symptoms of physical distress.      Resistance Training   Training Prescription Yes    Weight 3    Reps 10-15      Treadmill   MPH 1.7    Grade 0    Minutes 17    METs 2.3      T5 Nustep   Level 2    SPM 141    Minutes 22    METs 2.3           Nutrition:  Target Goals: Understanding of nutrition guidelines, daily intake of sodium <1563m, cholesterol <2017m calories 30% from fat and 7% or less from saturated fats, daily to have 5 or more servings of fruits and vegetables.  Biometrics:  Pre Biometrics - 03/06/20 1136      Pre Biometrics   Height 5' 4"  (1.626 m)    Weight 113.9 kg    Waist Circumference 50.5 inches    Hip Circumference 44 inches    Waist to Hip Ratio 1.15 %    BMI (Calculated) 43.08    Triceps Skinfold 6 mm    % Body Fat 35.5 %    Grip Strength 33.3 kg    Single Leg Stand 2.89 seconds            Nutrition Therapy Plan and Nutrition  Goals:  Nutrition Therapy & Goals - 04/22/20 0927      Personal Nutrition Goals   Comments Patient continues to says he is using weight watcher's on you-tube to use  recipes to lose weight. His long term goal is to lose 40 lbs. Program goal is 10 lbs. Will continue to monitor.      Intervention Plan   Intervention Nutrition handout(s) given to patient.           Nutrition Assessments:  Nutrition Assessments - 03/06/20 1053      Rate Your Plate Scores   Pre Score 100           Nutrition Goals Re-Evaluation:   Nutrition Goals Discharge (Final Nutrition Goals Re-Evaluation):   Psychosocial: Target Goals: Acknowledge presence or absence of significant depression and/or stress, maximize coping skills, provide positive support system. Participant is able to verbalize types and ability to use techniques and skills needed for reducing stress and depression.  Initial Review & Psychosocial Screening:  Initial Psych Review & Screening - 03/06/20 1158      Initial Review   Current issues with None Identified      Family Dynamics   Good Support System? Yes      Barriers   Psychosocial barriers to participate in program There are no identifiable barriers or psychosocial needs.      Screening Interventions   Interventions Encouraged to exercise;Provide feedback about the scores to participant    Expected Outcomes Short Term goal: Identification and review with participant of any Quality of Life or Depression concerns found by scoring the questionnaire.;Long Term goal: The participant improves quality of Life and PHQ9 Scores as seen by post scores and/or verbalization of changes           Quality of Life Scores:  Quality of Life - 03/06/20 1138      Quality of Life   Select Quality of Life      Quality of Life Scores   Health/Function Pre 14.04 %    Socioeconomic Pre 23.5 %    Psych/Spiritual Pre 20.43 %    Family Pre 27.6 %    GLOBAL Pre 19.45 %          Scores of  19 and below usually indicate a poorer quality of life in these areas.  A difference of  2-3 points is a clinically meaningful difference.  A difference of 2-3 points in the total score of the Quality of Life Index has been associated with significant improvement in overall quality of life, self-image, physical symptoms, and general health in studies assessing change in quality of life.  PHQ-9: Recent Review Flowsheet Data    Depression screen [facility] 2/9 03/06/2020   Decreased Interest 1   Down, Depressed, Hopeless 1   PHQ - 2 Score 2   Altered sleeping 2   Tired, decreased energy 2   Change in appetite 1   Feeling bad or failure about yourself  0   Trouble concentrating 1   Moving slowly or fidgety/restless 0   Suicidal thoughts 0   PHQ-9 Score 8   Difficult doing work/chores Somewhat difficult     Interpretation of Total Score  Total Score Depression Severity:  1-4 = Minimal depression, 5-9 = Mild depression, 10-14 = Moderate depression, 15-19 = Moderately severe depression, 20-27 = Severe depression   Psychosocial Evaluation and Intervention:  Psychosocial Evaluation - 03/06/20 1159      Psychosocial Evaluation & Interventions   Interventions Encouraged to exercise with the program and follow exercise prescription;Stress management education;Relaxation education    Comments Patient's initial QOL score was 19.45 and  his PHQ-9 score was 8. He has no h/o depression. He says  he is not depressed but his scores reflect his frustration with not being able to do the things he wants to do. He says he has good family support and a positive outlook. Will continue to monitor.    Expected Outcomes Patient will have no psychosocial issues identified at discharge.    Continue Psychosocial Services  No Follow up required           Psychosocial Re-Evaluation:  Psychosocial Re-Evaluation    [place] Name 03/20/20 1456 04/12/20 1434 04/22/20 0928         Psychosocial Re-Evaluation   Current issues  with None Identified None Identified None Identified     Comments Patient's initial QOL score was 19.45 and his PHQ-9 score was 8. He says he does feels frustruated at times due to not being able to do the things he wants to do. He hopes this will improve as he continues the program. Will continue to montior. Patient continues to have no psychosocial issues identified. Will continue to monitor. Patient continues to have no psychosocial issues identified. Will continue to monitor.     Expected Outcomes Patient will have no psychosocial issues identified at discharge with improved QOL and PHQ-9 scores. Patient will have no psychosocial issues identified at discharge with improved QOL and PHQ-9 scores. Patient will have no psychosocial issues identified at discharge with improved QOL and PHQ-9 scores.     Interventions Stress management education;Encouraged to attend Cardiac Rehabilitation for the exercise;Relaxation education Stress management education;Encouraged to attend Cardiac Rehabilitation for the exercise;Relaxation education Stress management education;Encouraged to attend Cardiac Rehabilitation for the exercise;Relaxation education     Continue Psychosocial Services  No Follow up required No Follow up required No Follow up required            Psychosocial Discharge (Final Psychosocial Re-Evaluation):  Psychosocial Re-Evaluation - 04/22/20 6195      Psychosocial Re-Evaluation   Current issues with None Identified    Comments Patient continues to have no psychosocial issues identified. Will continue to monitor.    Expected Outcomes Patient will have no psychosocial issues identified at discharge with improved QOL and PHQ-9 scores.    Interventions Stress management education;Encouraged to attend Cardiac Rehabilitation for the exercise;Relaxation education    Continue Psychosocial Services  No Follow up required           Vocational Rehabilitation: Provide vocational rehab assistance to  qualifying candidates.   Vocational Rehab Evaluation & Intervention:  Vocational Rehab - 03/06/20 1054      Initial Vocational Rehab Evaluation & Intervention   Assessment shows need for Vocational Rehabilitation No      Vocational Rehab Re-Evaulation   Comments Patient disabled.           Education: Education Goals: Education classes will be provided on a weekly basis, covering required topics. Participant will state understanding/return demonstration of topics presented.  Learning Barriers/Preferences:   Education Topics: Hypertension, Hypertension Reduction -Define heart disease and high blood pressure. Discus how high blood pressure affects the body and ways to reduce high blood pressure.   CARDIAC REHAB PHASE II EXERCISE from 04/17/2020 in [place]  Date 03/27/20  Educator DF  Instruction Review Code 2- Demonstrated Understanding      Exercise and Your Heart -Discuss why it is important to exercise, the FITT principles of exercise, normal and abnormal responses to exercise, and how to exercise safely.   Angina -Discuss definition of angina, causes of angina, treatment of angina, and how to decrease  risk of having angina.   CARDIAC REHAB PHASE II EXERCISE from 04/17/2020 in [place]  Date 04/10/20  Educator DF  Instruction Review Code 1- Verbalizes Understanding      Cardiac Medications -Review what the following cardiac medications are used for, how they affect the body, and side effects that may occur when taking the medications.  Medications include Aspirin, Beta blockers, calcium channel blockers, ACE Inhibitors, angiotensin receptor blockers, diuretics, digoxin, and antihyperlipidemics.   CARDIAC REHAB PHASE II EXERCISE from 04/17/2020 in [place]  Date 04/17/20  Educator DF  Instruction Review Code 1- Verbalizes Understanding      Congestive Heart Failure -Discuss the definition of  CHF, how to live with CHF, the signs and symptoms of CHF, and how keep track of weight and sodium intake.   Heart Disease and Intimacy -Discus the effect sexual activity has on the heart, how changes occur during intimacy as we age, and safety during sexual activity.   Smoking Cessation / COPD -Discuss different methods to quit smoking, the health benefits of quitting smoking, and the definition of COPD.   Nutrition I: Fats -Discuss the types of cholesterol, what cholesterol does to the heart, and how cholesterol levels can be controlled.   Nutrition II: Labels -Discuss the different components of food labels and how to read food label   Heart Parts/Heart Disease and PAD -Discuss the anatomy of the heart, the pathway of blood circulation through the heart, and these are affected by heart disease.   Stress I: Signs and Symptoms -Discuss the causes of stress, how stress may lead to anxiety and depression, and ways to limit stress.   Stress II: Relaxation -Discuss different types of relaxation techniques to limit stress.   CARDIAC REHAB PHASE II EXERCISE from 04/17/2020 in [place]  Date 03/13/20  Educator [place]  Instruction Review Code 1- Verbalizes Understanding      Warning Signs of Stroke / TIA -Discuss definition of a stroke, what the signs and symptoms are of a stroke, and how to identify when someone is having stroke.   CARDIAC REHAB PHASE II EXERCISE from 04/17/2020 in [place]  Date 03/20/20  Educator Etheleen Mayhew  Instruction Review Code 1- Verbalizes Understanding      Knowledge Questionnaire Score:  Knowledge Questionnaire Score - 03/06/20 1053      Knowledge Questionnaire Score   Pre Score 24/28           Core Components/Risk Factors/Patient Goals at Admission:  Personal Goals and Risk Factors at Admission - 03/06/20 1055      Core Components/Risk Factors/Patient Goals on Admission    Weight Management Yes     Intervention Weight Management: Provide education and appropriate resources to help participant work on and attain dietary goals.;Weight Management/Obesity: Establish reasonable short term and long term weight goals.;Obesity: Provide education and appropriate resources to help participant work on and attain dietary goals.    Admit Weight 251 lb 1.6 oz (113.9 kg)    Goal Weight: Short Term 246 lb (111.6 kg)    Goal Weight: Long Term 241 lb (109.3 kg)    Expected Outcomes Short Term: Continue to assess and modify interventions until short term weight is achieved;Weight Gain: Understanding of general recommendations for a high calorie, high protein meal plan that promotes weight gain by distributing calorie intake throughout the day with the consumption for 4-5 meals, snacks, and/or supplements    Improve shortness of breath with ADL's  Yes    Intervention Provide education, individualized exercise plan and daily activity instruction to help decrease symptoms of SOB with activities of daily living.    Expected Outcomes Short Term: Improve cardiorespiratory fitness to achieve a reduction of symptoms when performing ADLs;Long Term: Be able to perform more ADLs without symptoms or delay the onset of symptoms    Personal Goal Other Yes    Personal Goal Breathe better; Be able to do his ADL's; lose 10 lbs in program and 40 lbs long term.    Intervention Patient will attend cardiac rehab 3 days/week and supplement with exercise 2 days/week at home.    Expected Outcomes Patient will meet both program and personal goals.           Core Components/Risk Factors/Patient Goals Review:   Goals and Risk Factor Review    Row Name 03/20/20 1459 04/12/20 1438 04/22/20 0928         Core Components/Risk Factors/Patient Goals Review   Personal Goals Review Weight Management/Obesity;Other  Lose 10 lbs in program; 40 lbs long term; be able to do his ADL's. Weight Management/Obesity;Other  Lose 10 lbs in the  program; Lose 40 lbs longterm. Be able to do his ADL's. Weight Management/Obesity;Other  Lose 10 lbs; breathe better; be able to do ADL's.     Review Patient is new to the program completing 4 sessions maintaining his weight since his initial visit. He continues to smoke 10 cigerattes/day and says he is not interested in quitting at this time. He will continue to work toward meeting both program and personal goals. Will continue to monitor. Patient has completed 11 sessions maintaining his weight since lasts 30 day review. He is doing well in the program with progression with consistent attendance. He continues to say he is smoking 10 cigerattes/day and is not interested in [facility]. He has a bronchoscopy 7/20 with a biospy on a lung nodule which came back negative. He is supposed to have a f/u CT in October. He says he feels his endurance has improved and he is feeling better overall. Will continue to monitor for progress. Patient has completed 14 sessions maintaining his weight since last 30 day review. His attendance has not been as consistent this review period due to MD appointments. He recently has been diagnosed with a pinched nerve in his back and will not be able to attend this week. He continues to smoke 10 cigerattes/day and is not interested in quitting. He continues to say he feels like his endurance continues to improve and is feeling better overall. Will continue to monitor for progress.     Expected Outcomes Patient will continue to attend the sessions and meet both program and personal goals. Patient will continue to attend the sessions and meet both program and personal goals. Patient will continue to attend the sessions and meet both program and personal goals.            Core Components/Risk Factors/Patient Goals at Discharge (Final Review):   Goals and Risk Factor Review - 04/22/20 0928      Core Components/Risk Factors/Patient Goals Review   Personal Goals Review Weight  Management/Obesity;Other   Lose 10 lbs; breathe better; be able to do ADL's.   Review Patient has completed 14 sessions maintaining his weight since last 30 day review. His attendance has not been as consistent this review period due to MD appointments. He recently has been diagnosed with a pinched nerve in his back and will not be able  to attend this week. He continues to smoke 10 cigerattes/day and is not interested in quitting. He continues to say he feels like his endurance continues to improve and is feeling better overall. Will continue to monitor for progress.    Expected Outcomes Patient will continue to attend the sessions and meet both program and personal goals.           ITP Comments:   Comments: ITP REVIEW Pt is making expected progress toward Cardiac Rehab goals after completing 14 sessions. Recommend continued exercise, life style modification, education, and increased stamina and strength.

## 2020-04-26 ENCOUNTER — Encounter (HOSPITAL_COMMUNITY): Payer: Medicare Other

## 2020-04-26 NOTE — Progress Notes (Signed)
Discharge Progress Report  Patient Details  Name: Daniel Ball MRN: [id number] Date of Birth: [birth year] Referring Provider:     [place] from 03/06/2020 in [place]  Referring Provider Dr. Harrington Challenger       Number of Visits: 14  Reason for Discharge:  Early Exit:  Personal  Smoking History:  Social History   Tobacco Use  Smoking Status Current Every Day Smoker  . Packs/day: 0.50  . Years: 54.00  . Pack years: 27.00  . Types: Cigarettes  Smokeless Tobacco Never Used  Tobacco Comment   10 cigarettes a day 02/28/20 [facility]    Diagnosis:  Status post coronary artery stent placement  ADL UCSD:   Initial Exercise Prescription:  Initial Exercise Prescription - 03/06/20 1100      Date of Initial Exercise RX and Referring Provider   Date 03/06/20    Referring Provider Dr. Harrington Challenger    Expected Discharge Date 06/06/20      Treadmill   MPH 1.1    Grade 0    Minutes 17    METs 1.8      T5 Nustep   Level 1    SPM 62    Minutes 22    METs 2      Prescription Details   Frequency (times per week) 3    Duration Progress to 30 minutes of continuous aerobic without signs/symptoms of physical distress      Intensity   THRR 40-80% of Max Heartrate 105-122-139    Ratings of Perceived Exertion 11-13    Perceived Dyspnea 0-4      Resistance Training   Training Prescription Yes    Weight 1    Reps 10-15           Discharge Exercise Prescription (Final Exercise Prescription Changes):  Exercise Prescription Changes - 04/15/20 1200      Response to Exercise   Blood Pressure (Admit) 140/76    Blood Pressure (Exercise) 154/82    Blood Pressure (Exit) 132/82    Heart Rate (Admit) 78 bpm    Heart Rate (Exercise) 93 bpm    Heart Rate (Exit) 86 bpm    Rating of Perceived Exertion (Exercise) 11    Duration Continue with 30 min of aerobic exercise without signs/symptoms of physical distress.    Intensity THRR unchanged       Progression   Progression Continue to progress workloads to maintain intensity without signs/symptoms of physical distress.      Resistance Training   Training Prescription Yes    Weight 3    Reps 10-15      Treadmill   MPH 1.7    Grade 0    Minutes 17    METs 2.3      T5 Nustep   Level 2    SPM 141    Minutes 22    METs 2.3           Functional Capacity:  6 Minute Walk    Row Name 03/06/20 1120         6 Minute Walk   Phase Initial     Distance 1000 feet     Walk Time 6 minutes     # of Rest Breaks 0     MPH 1.89     METS 2.45     RPE 13     Perceived Dyspnea  14     VO2 Peak 6.13     Symptoms Yes (  comment)     Comments Bilateral hip pain 6/10 and c/o of chest burning 3/10 after walking.     Resting HR 73 bpm     Resting BP 110/60     Resting Oxygen Saturation  95 %     Exercise Oxygen Saturation  during 6 min walk 97 %     Max Ex. HR 81 bpm     Max Ex. BP 150/80     2 Minute Post BP 120/80            Psychological, QOL, Others - Outcomes: PHQ 2/9: Depression screen PHQ 2/9 03/06/2020  Decreased Interest 1  Down, Depressed, Hopeless 1  PHQ - 2 Score 2  Altered sleeping 2  Tired, decreased energy 2  Change in appetite 1  Feeling bad or failure about yourself  0  Trouble concentrating 1  Moving slowly or fidgety/restless 0  Suicidal thoughts 0  PHQ-9 Score 8  Difficult doing work/chores Somewhat difficult    Quality of Life:  Quality of Life - 03/06/20 1138      Quality of Life   Select Quality of Life      Quality of Life Scores   Health/Function Pre 14.04 %    Socioeconomic Pre 23.5 %    Psych/Spiritual Pre 20.43 %    Family Pre 27.6 %    GLOBAL Pre 19.45 %           Personal Goals: Goals established at orientation with interventions provided to work toward goal.  Personal Goals and Risk Factors at Admission - 03/06/20 1055      Core Components/Risk Factors/Patient Goals on Admission    Weight Management Yes    Intervention  Weight Management: Provide education and appropriate resources to help participant work on and attain dietary goals.;Weight Management/Obesity: Establish reasonable short term and long term weight goals.;Obesity: Provide education and appropriate resources to help participant work on and attain dietary goals.    Admit Weight 251 lb 1.6 oz (113.9 kg)    Goal Weight: Short Term 246 lb (111.6 kg)    Goal Weight: Long Term 241 lb (109.3 kg)    Expected Outcomes Short Term: Continue to assess and modify interventions until short term weight is achieved;Weight Gain: Understanding of general recommendations for a high calorie, high protein meal plan that promotes weight gain by distributing calorie intake throughout the day with the consumption for 4-5 meals, snacks, and/or supplements    Improve shortness of breath with ADL's Yes    Intervention Provide education, individualized exercise plan and daily activity instruction to help decrease symptoms of SOB with activities of daily living.    Expected Outcomes Short Term: Improve cardiorespiratory fitness to achieve a reduction of symptoms when performing ADLs;Long Term: Be able to perform more ADLs without symptoms or delay the onset of symptoms    Personal Goal Other Yes    Personal Goal Breathe better; Be able to do his ADL's; lose 10 lbs in program and 40 lbs long term.    Intervention Patient will attend cardiac rehab 3 days/week and supplement with exercise 2 days/week at home.    Expected Outcomes Patient will meet both program and personal goals.            Personal Goals Discharge:  Goals and Risk Factor Review    Row Name 03/20/20 1459 04/12/20 1438 04/22/20 0928         Core Components/Risk Factors/Patient Goals Review   Personal Goals Review Weight Management/Obesity;Other  Lose 10 lbs in program; 40 lbs long term; be able to do his ADL's. Weight Management/Obesity;Other  Lose 10 lbs in the program; Lose 40 lbs longterm. Be able to do his  ADL's. Weight Management/Obesity;Other  Lose 10 lbs; breathe better; be able to do ADL's.     Review Patient is new to the program completing 4 sessions maintaining his weight since his initial visit. He continues to smoke 10 cigerattes/day and says he is not interested in quitting at this time. He will continue to work toward meeting both program and personal goals. Will continue to monitor. Patient has completed 11 sessions maintaining his weight since lasts 30 day review. He is doing well in the program with progression with consistent attendance. He continues to say he is smoking 10 cigerattes/day and is not interested in [place]. He has a bronchoscopy 7/20 with a biospy on a lung nodule which came back negative. He is supposed to have a f/u CT in October. He says he feels his endurance has improved and he is feeling better overall. Will continue to monitor for progress. Patient has completed 14 sessions maintaining his weight since last 30 day review. His attendance has not been as consistent this review period due to MD appointments. He recently has been diagnosed with a pinched nerve in his back and will not be able to attend this week. He continues to smoke 10 cigerattes/day and is not interested in quitting. He continues to say he feels like his endurance continues to improve and is feeling better overall. Will continue to monitor for progress.     Expected Outcomes Patient will continue to attend the sessions and meet both program and personal goals. Patient will continue to attend the sessions and meet both program and personal goals. Patient will continue to attend the sessions and meet both program and personal goals.            Exercise Goals and Review:  Exercise Goals    Row Name 03/06/20 1134 04/12/20 1159 04/22/20 1403         Exercise Goals   Increase Physical Activity Yes Yes Yes     Intervention Provide advice, education, support and counseling about physical activity/exercise  needs.;Develop an individualized exercise prescription for aerobic and resistive training based on initial evaluation findings, risk stratification, comorbidities and participant's personal goals. Provide advice, education, support and counseling about physical activity/exercise needs.;Develop an individualized exercise prescription for aerobic and resistive training based on initial evaluation findings, risk stratification, comorbidities and participant's personal goals. Provide advice, education, support and counseling about physical activity/exercise needs.;Develop an individualized exercise prescription for aerobic and resistive training based on initial evaluation findings, risk stratification, comorbidities and participant's personal goals.     Expected Outcomes Short Term: Attend rehab on a regular basis to increase amount of physical activity.;Long Term: Add in home exercise to make exercise part of routine and to increase amount of physical activity. Short Term: Attend rehab on a regular basis to increase amount of physical activity.;Long Term: Add in home exercise to make exercise part of routine and to increase amount of physical activity.;Long Term: Exercising regularly at least 3-5 days a week. Short Term: Attend rehab on a regular basis to increase amount of physical activity.;Long Term: Add in home exercise to make exercise part of routine and to increase amount of physical activity.;Long Term: Exercising regularly at least 3-5 days a week.     Increase Strength and Stamina Yes Yes Yes  Intervention Provide advice, education, support and counseling about physical activity/exercise needs.;Develop an individualized exercise prescription for aerobic and resistive training based on initial evaluation findings, risk stratification, comorbidities and participant's personal goals. Provide advice, education, support and counseling about physical activity/exercise needs.;Develop an individualized  exercise prescription for aerobic and resistive training based on initial evaluation findings, risk stratification, comorbidities and participant's personal goals. Provide advice, education, support and counseling about physical activity/exercise needs.;Develop an individualized exercise prescription for aerobic and resistive training based on initial evaluation findings, risk stratification, comorbidities and participant's personal goals.     Expected Outcomes Long Term: Improve cardiorespiratory fitness, muscular endurance and strength as measured by increased METs and functional capacity (6MWT);Short Term: Perform resistance training exercises routinely during rehab and add in resistance training at home Short Term: Increase workloads from initial exercise prescription for resistance, speed, and METs.;Short Term: Perform resistance training exercises routinely during rehab and add in resistance training at home;Long Term: Improve cardiorespiratory fitness, muscular endurance and strength as measured by increased METs and functional capacity (6MWT) Short Term: Increase workloads from initial exercise prescription for resistance, speed, and METs.;Short Term: Perform resistance training exercises routinely during rehab and add in resistance training at home;Long Term: Improve cardiorespiratory fitness, muscular endurance and strength as measured by increased METs and functional capacity (6MWT)     Able to understand and use rate of perceived exertion (RPE) scale Yes Yes Yes     Intervention Provide education and explanation on how to use RPE scale Provide education and explanation on how to use RPE scale Provide education and explanation on how to use RPE scale     Expected Outcomes Short Term: Able to use RPE daily in rehab to express subjective intensity level;Long Term:  Able to use RPE to guide intensity level when exercising independently Short Term: Able to use RPE daily in rehab to express subjective  intensity level;Long Term:  Able to use RPE to guide intensity level when exercising independently Short Term: Able to use RPE daily in rehab to express subjective intensity level;Long Term:  Able to use RPE to guide intensity level when exercising independently     Able to understand and use Dyspnea scale Yes -- --     Intervention Provide education and explanation on how to use Dyspnea scale -- --     Expected Outcomes Short Term: Able to use Dyspnea scale daily in rehab to express subjective sense of shortness of breath during exertion;Long Term: Able to use Dyspnea scale to guide intensity level when exercising independently -- --     Knowledge and understanding of Target Heart Rate Range (THRR) Yes Yes Yes     Intervention Provide education and explanation of THRR including how the numbers were predicted and where they are located for reference Provide education and explanation of THRR including how the numbers were predicted and where they are located for reference Provide education and explanation of THRR including how the numbers were predicted and where they are located for reference     Expected Outcomes Long Term: Able to use THRR to govern intensity when exercising independently;Short Term: Able to state/look up THRR Short Term: Able to state/look up THRR;Short Term: Able to use daily as guideline for intensity in rehab;Long Term: Able to use THRR to govern intensity when exercising independently Short Term: Able to state/look up THRR;Short Term: Able to use daily as guideline for intensity in rehab;Long Term: Able to use THRR to govern intensity when exercising independently  Able to check pulse independently Yes Yes Yes     Intervention Provide education and demonstration on how to check pulse in carotid and radial arteries.;Review the importance of being able to check your own pulse for safety during independent exercise Provide education and demonstration on how to check pulse in carotid  and radial arteries.;Review the importance of being able to check your own pulse for safety during independent exercise Provide education and demonstration on how to check pulse in carotid and radial arteries.;Review the importance of being able to check your own pulse for safety during independent exercise     Expected Outcomes Short Term: Able to explain why pulse checking is important during independent exercise;Long Term: Able to check pulse independently and accurately Short Term: Able to explain why pulse checking is important during independent exercise;Long Term: Able to check pulse independently and accurately Short Term: Able to explain why pulse checking is important during independent exercise;Long Term: Able to check pulse independently and accurately     Understanding of Exercise Prescription Yes Yes Yes     Intervention Provide education, explanation, and written materials on patient's individual exercise prescription Provide education, explanation, and written materials on patient's individual exercise prescription Provide education, explanation, and written materials on patient's individual exercise prescription     Expected Outcomes Short Term: Able to explain program exercise prescription;Long Term: Able to explain home exercise prescription to exercise independently Short Term: Able to explain program exercise prescription;Long Term: Able to explain home exercise prescription to exercise independently Short Term: Able to explain program exercise prescription;Long Term: Able to explain home exercise prescription to exercise independently            Exercise Goals Re-Evaluation:  Exercise Goals Re-Evaluation    [place] Name 04/12/20 1047 04/22/20 1404           Exercise Goal Re-Evaluation   Exercise Goals Review Increase Physical Activity;Increase Strength and Stamina;Able to understand and use rate of perceived exertion (RPE) scale;Knowledge and understanding of Target Heart Rate  Range (THRR);Able to check pulse independently;Understanding of Exercise Prescription Increase Physical Activity;Increase Strength and Stamina;Able to understand and use rate of perceived exertion (RPE) scale;Knowledge and understanding of Target Heart Rate Range (THRR);Able to check pulse independently;Understanding of Exercise Prescription      Comments Patient has attended 10 exercise sessions. Patient is still smoking 10 cigarette per day even though he has been counciled on quitting. Patient feels his endurance is progressing and his met levels are increasing as well. Patient is exercising at 2.3 mets on the stepper. Will continue to monitor and progress as able. Patient has completed 13 exercise sessions. He is still skoking about 10 cigarettes per day. He was unable to complete his last exercise session due to unusual shortness of breath. He also has pinched a nerve in his lower back and will be absent from exercise this week. He had been progressing well in the program before these set backs. Pt is exercising at 2.4 METs on the stepper. Will continue to monitor and progress as able.      Expected Outcomes Reach goals and increase stamia Reach goals and increase stamina.             Nutrition & Weight - Outcomes:  Pre Biometrics - 03/06/20 1136      Pre Biometrics   Height _0  (1.626 m)    Weight 113.9 kg    Waist Circumference 50.5 inches    Hip Circumference 44 inches  Waist to Hip Ratio 1.15 %    BMI (Calculated) 43.08    Triceps Skinfold 6 mm    % Body Fat 35.5 %    Grip Strength 33.3 kg    Single Leg Stand 2.89 seconds            Nutrition:  Nutrition Therapy & Goals - 04/22/20 0927      Personal Nutrition Goals   Comments Patient continues to says he is using weight watcher's on you-tube to use recipes to lose weight. His long term goal is to lose 40 lbs. Program goal is 10 lbs. Will continue to monitor.      Intervention Plan   Intervention Nutrition handout(s)  given to patient.           Nutrition Discharge:  Nutrition Assessments - 03/06/20 1053      Rate Your Plate Scores   Pre Score 100           Education Questionnaire Score:  Knowledge Questionnaire Score - 03/06/20 1053      Knowledge Questionnaire Score   Pre Score 24/28           Patient dropped out of the program 04/23/20. His last attendane date was 04/19/20 with 14 sessions completed. He has been diagnosed with a pinched nerve in his back with degenerative disc disease and been started on hydrocodone for pain. He said he will not be able to finish the program due to the pain. MD will be notified.

## 2020-04-29 ENCOUNTER — Encounter (HOSPITAL_COMMUNITY): Payer: Medicare Other

## 2020-05-01 ENCOUNTER — Encounter (HOSPITAL_COMMUNITY): Payer: Medicare Other

## 2020-05-03 ENCOUNTER — Encounter (HOSPITAL_COMMUNITY): Payer: Medicare Other

## 2020-05-06 ENCOUNTER — Encounter (HOSPITAL_COMMUNITY): Payer: Medicare Other

## 2020-05-08 ENCOUNTER — Encounter (HOSPITAL_COMMUNITY): Payer: Medicare Other

## 2020-05-10 ENCOUNTER — Encounter (HOSPITAL_COMMUNITY): Payer: Medicare Other

## 2020-05-13 ENCOUNTER — Encounter (HOSPITAL_COMMUNITY): Payer: Medicare Other

## 2020-05-15 ENCOUNTER — Encounter (HOSPITAL_COMMUNITY): Payer: Medicare Other

## 2020-05-17 ENCOUNTER — Other Ambulatory Visit: Payer: Self-pay | Admitting: Internal Medicine

## 2020-05-17 ENCOUNTER — Encounter (HOSPITAL_COMMUNITY): Payer: Medicare Other

## 2020-05-17 MED ORDER — ROSUVASTATIN CALCIUM 40 MG PO TABS: 40.0000 mg | ORAL_TABLET | Freq: Every day | ORAL | 3 refills | Status: DC

## 2020-05-17 NOTE — Telephone Encounter (Signed)
*  STAT* If patient is at the pharmacy, call can be transferred to refill team.   1. Which medications need to be refilled? (please list name of each medication and dose if known) rosuvastatin (CRESTOR) 40 MG tablet  2. Which pharmacy/location (including street and city if local pharmacy) is medication to be sent to? [place], [place] [address]  3. Do they need a 30 day or 90 day supply? 90 day

## 2020-05-20 ENCOUNTER — Encounter (HOSPITAL_COMMUNITY): Payer: Medicare Other

## 2020-05-22 ENCOUNTER — Encounter (HOSPITAL_COMMUNITY): Payer: Medicare Other

## 2020-05-24 ENCOUNTER — Encounter (HOSPITAL_COMMUNITY): Payer: Medicare Other

## 2020-05-27 ENCOUNTER — Encounter (HOSPITAL_COMMUNITY): Payer: Medicare Other

## 2020-05-29 ENCOUNTER — Encounter (HOSPITAL_COMMUNITY): Payer: Medicare Other

## 2020-05-31 ENCOUNTER — Encounter (HOSPITAL_COMMUNITY): Payer: Medicare Other

## 2020-06-10 ENCOUNTER — Ambulatory Visit: Payer: Medicare Other | Admitting: Internal Medicine

## 2020-06-10 DIAGNOSIS — I4891 Unspecified atrial fibrillation: Secondary | ICD-10-CM | POA: Diagnosis not present

## 2020-06-10 DIAGNOSIS — N400 Enlarged prostate without lower urinary tract symptoms: Secondary | ICD-10-CM | POA: Diagnosis not present

## 2020-06-10 DIAGNOSIS — I10 Essential (primary) hypertension: Secondary | ICD-10-CM | POA: Diagnosis not present

## 2020-06-10 DIAGNOSIS — E782 Mixed hyperlipidemia: Secondary | ICD-10-CM | POA: Diagnosis not present

## 2020-06-10 DIAGNOSIS — K219 Gastro-esophageal reflux disease without esophagitis: Secondary | ICD-10-CM | POA: Diagnosis not present

## 2020-06-10 DIAGNOSIS — I25118 Atherosclerotic heart disease of native coronary artery with other forms of angina pectoris: Secondary | ICD-10-CM | POA: Diagnosis not present

## 2020-06-10 DIAGNOSIS — F320 Major depressive disorder, single episode, mild: Secondary | ICD-10-CM | POA: Diagnosis not present

## 2020-06-10 DIAGNOSIS — I480 Paroxysmal atrial fibrillation: Secondary | ICD-10-CM | POA: Diagnosis not present

## 2020-06-10 DIAGNOSIS — J449 Chronic obstructive pulmonary disease, unspecified: Secondary | ICD-10-CM | POA: Diagnosis not present

## 2020-06-17 DIAGNOSIS — G4733 Obstructive sleep apnea (adult) (pediatric): Secondary | ICD-10-CM | POA: Diagnosis not present

## 2020-06-24 DIAGNOSIS — M79675 Pain in left toe(s): Secondary | ICD-10-CM | POA: Diagnosis not present

## 2020-06-24 DIAGNOSIS — B351 Tinea unguium: Secondary | ICD-10-CM | POA: Diagnosis not present

## 2020-06-24 DIAGNOSIS — M79674 Pain in right toe(s): Secondary | ICD-10-CM | POA: Diagnosis not present

## 2020-06-27 ENCOUNTER — Other Ambulatory Visit: Payer: Self-pay | Admitting: Student

## 2020-07-01 ENCOUNTER — Other Ambulatory Visit (HOSPITAL_COMMUNITY)
Admission: RE | Admit: 2020-07-01 | Discharge: 2020-07-01 | Disposition: A | Discharge status: Home / Self Care | Payer: Medicare Other | Source: Ambulatory Visit | Attending: Internal Medicine | Admitting: Internal Medicine

## 2020-07-01 ENCOUNTER — Other Ambulatory Visit: Payer: Self-pay

## 2020-07-01 ENCOUNTER — Encounter: Payer: Self-pay | Admitting: Internal Medicine

## 2020-07-01 ENCOUNTER — Ambulatory Visit (INDEPENDENT_AMBULATORY_CARE_PROVIDER_SITE_OTHER): Payer: Medicare Other | Admitting: Internal Medicine

## 2020-07-01 ENCOUNTER — Ambulatory Visit (HOSPITAL_COMMUNITY)
Admission: RE | Admit: 2020-07-01 | Discharge: 2020-07-01 | Disposition: A | Discharge status: Home / Self Care | Payer: Medicare Other | Source: Ambulatory Visit | Attending: Emergency Medicine | Admitting: Emergency Medicine

## 2020-07-01 VITALS — BP 136/66 | HR 68 | Ht 65.0 in | Wt 256.0 lb

## 2020-07-01 DIAGNOSIS — J8410 Pulmonary fibrosis, unspecified: Secondary | ICD-10-CM | POA: Insufficient documentation

## 2020-07-01 DIAGNOSIS — J439 Emphysema, unspecified: Secondary | ICD-10-CM | POA: Diagnosis not present

## 2020-07-01 DIAGNOSIS — I255 Ischemic cardiomyopathy: Secondary | ICD-10-CM | POA: Diagnosis not present

## 2020-07-01 DIAGNOSIS — R0600 Dyspnea, unspecified: Secondary | ICD-10-CM | POA: Insufficient documentation

## 2020-07-01 DIAGNOSIS — I2510 Atherosclerotic heart disease of native coronary artery without angina pectoris: Secondary | ICD-10-CM | POA: Diagnosis not present

## 2020-07-01 DIAGNOSIS — R911 Solitary pulmonary nodule: Secondary | ICD-10-CM

## 2020-07-01 DIAGNOSIS — R0609 Other forms of dyspnea: Secondary | ICD-10-CM

## 2020-07-01 DIAGNOSIS — I700 Atherosclerosis of aorta: Secondary | ICD-10-CM | POA: Insufficient documentation

## 2020-07-01 LAB — BASIC METABOLIC PANEL
Anion gap: 11 (ref 5–15)
BUN: 16 mg/dL (ref 8–23)
CO2: 25 mmol/L (ref 22–32)
Calcium: 9.3 mg/dL (ref 8.9–10.3)
Chloride: 101 mmol/L (ref 98–111)
Creatinine, Ser: 1.08 mg/dL (ref 0.61–1.24)
GFR, Estimated: >60 mL/min (ref >60)
Glucose, Bld: 98 mg/dL (ref 70–99)
Potassium: 4.1 mmol/L (ref 3.5–5.1)
Sodium: 137 mmol/L (ref 135–145)

## 2020-07-01 LAB — BRAIN NATRIURETIC PEPTIDE: B Natriuretic Peptide: 31 pg/mL (ref 0.0–100.0)

## 2020-07-01 MED ORDER — AMLODIPINE BESYLATE 5 MG PO TABS
5.0000 mg | ORAL_TABLET | Freq: Two times a day (BID) | ORAL | 3 refills | Status: DC
Start: 2020-07-01 — End: 2020-10-29

## 2020-07-01 NOTE — Patient Instructions (Signed)
Medication Instructions:    START TAKING:  AMLODIPINE 5 MG TWICE A DAY   *If you need a refill on your cardiac medications before your next appointment, please call your pharmacy*   Lab Work: BMET AND BMP TODAY   If you have labs (blood work) drawn today and your tests are completely normal, you will receive your results only by:  [place] (if you have MyChart) OR  A paper copy in the mail If you have any lab test that is abnormal or we need to change your treatment, we will call you to review the results.   Testing/Procedures: NONE ORDERED  TODAY    Follow-Up: At [facility], you and your health needs are our priority.  As part of our continuing mission to provide you with exceptional heart care, we have created designated Provider Care Teams.  These Care Teams include your primary Cardiologist (physician) and Advanced Practice Providers (APPs -  Physician Assistants and Nurse Practitioners) who all work together to provide you with the care you need, when you need it.  We recommend signing up for the patient portal called "MyChart".  Sign up information is provided on this After Visit Summary.  MyChart is used to connect with patients for Virtual Visits (Telemedicine).  Patients are able to view lab/test results, encounter notes, upcoming appointments, etc.  Non-urgent messages can be sent to your provider as well.   To learn more about what you can do with MyChart, go to [email or web address].    Your next appointment:  BASED UPON RESULTS

## 2020-07-01 NOTE — Progress Notes (Signed)
Cardiology Office Note   Date:  07/01/2020   ID:  Daniel Ball, Daniel Ball 10/13/1955, MRN [id number]  PCP:  [name], MD  Cardiologist:   [name], MD   Pt presents for f/u of CAD    History of Present Illness: Daniel Ball is a 64 y.o. male with a history of HTN, COPD, tobacco use, reported OSA  and CAD    He is followed by Dr Dema Severin at [facility].  He had previously seen Thurman Coyer  I saw him in Feb 2021 With symptoms of chest tightness and SOB I recomm L heart cath.  Cardiac cath performed 10/30/2019 revealed 60% proximal LAD that is heavily calcified and hemodynamically significant DFR 0.86 as well as a multifocal RCA disease with long segment of 40% proximal stenosis and irregular distal disease 70 to 80% not hemodynamically significant DFR 0.94.  Also focal ostial stenosis in the right PDA 70%, mild reduced LV function EF 45 to 50% mildly elevated right heart filling pressures.  Rec were for  optimizing medical therapy for CAD and cardiomyopathy.  If re angina persisted despite maximum tolerated doses of at least 2 antianginals PCI of the LAD could be considered.  Due to heavy calcification atherectomy would need to be considered.  Prior to discharge the patient went into A. fib with rate controlled.  Eliquis 5 mg twice daily was added for CHA2DS2-VASc of 3 hypertension CAD and CHF.  Since then the pt continued to complaine of dyspnea and on 4/29 21 underwent athrectomy to prox/mid LAD with Synergy 3.5x 28 mm DES placed  Recomm Plavix and Eliquis for 6 months the Eliquis and ASA   Crestor increased     The pt was seen by B Strader in May  He said he was disappointed that after the intervention he still had SOB  Imdur did not help and this was disconftinued  Since then he still notes SOB with exertion  No change since May   He denies CP     He contiues to smoke about 10 cigs per day          Current Meds  Medication Sig   albuterol (VENTOLIN HFA) 108 (90 Base) MCG/ACT inhaler  Inhale 2 puffs into the lungs every 4 (four) hours as needed for wheezing or shortness of breath. (Patient taking differently: Inhale 2 puffs into the lungs daily. )   amLODipine (NORVASC) 5 MG tablet Take 1.5 tablets (7.5 mg total) by mouth daily.   apixaban (ELIQUIS) 5 MG TABS tablet Take 1 tablet (5 mg total) by mouth 2 (two) times daily. Okay to restart this medication on 7/22   buPROPion (WELLBUTRIN XL) 300 MG 24 hr tablet Take 300 mg by mouth daily.   clopidogrel (PLAVIX) 75 MG tablet Take 1 tablet (75 mg total) by mouth daily with breakfast. Okay to restart this medication on 7/22   furosemide (LASIX) 40 MG tablet TAKE 1 TABLET BY MOUTH ONCE DAILY AS NEEDED (FOR  EDEMA  OR  WEIGHT  GAIN).   isosorbide mononitrate (IMDUR) 60 MG 24 hr tablet Take 60 mg by mouth daily.   metoprolol succinate (TOPROL XL) 25 MG 24 hr tablet Take 1 tablet (25 mg total) by mouth daily.   nitroGLYCERIN (NITROSTAT) 0.4 MG SL tablet Place 1 tablet (0.4 mg total) under the tongue every 5 (five) minutes as needed for chest pain.   pantoprazole (PROTONIX) 40 MG tablet Take 1 tablet (40 mg total) by mouth daily.  potassium chloride SA (KLOR-CON) 20 MEQ tablet Take 1 tablet (20 mEq total) by mouth daily as needed (take on the days you take Lasix.).   rosuvastatin (CRESTOR) 40 MG tablet Take 1 tablet (40 mg total) by mouth daily.   valsartan (DIOVAN) 320 MG tablet Take 320 mg by mouth daily.     Allergies:   Patient has no known allergies.   Past Medical History:  Diagnosis Date   Arthritis    Atrial fibrillation (HCC)    COPD (chronic obstructive pulmonary disease) ([place])    Coronary artery disease    a. 12/2019: s/p orbital atherectomy and DES placement to proximal/mid LAD.    GERD (gastroesophageal reflux disease)    Hyperlipidemia    Hypertension    hx HBP - MEDS DC'D 10 YRS since BP has been in normal range   Impingement syndrome of shoulder    left   Pre-diabetes    Shortness of  breath    with exertion   Sleep apnea    couldnt tolerate CPAP   Spinal stenosis     Past Surgical History:  Procedure Laterality Date   BRONCHIAL BIOPSY  04/09/2020   Procedure: BRONCHIAL BIOPSIES;  Surgeon: Collene Gobble, MD;  Location: [facility];  Service: Pulmonary;;   BRONCHIAL BRUSHINGS  04/09/2020   Procedure: BRONCHIAL BRUSHINGS;  Surgeon: Collene Gobble, MD;  Location[facility] ENDOSCOPY;  Service: Pulmonary;;   BRONCHIAL NEEDLE ASPIRATION BIOPSY  04/09/2020   Procedure: BRONCHIAL NEEDLE ASPIRATION BIOPSIES;  Surgeon: Collene Gobble, MD;  Location: [facility];  Service: Pulmonary;;   BRONCHIAL WASHINGS  04/09/2020   Procedure: BRONCHIAL WASHINGS;  Surgeon: Collene Gobble, MD;  Location: [facility] ENDOSCOPY;  Service: Pulmonary;;   CORONARY ATHERECTOMY N/A 01/18/2020   Procedure: CORONARY ATHERECTOMY;  Surgeon: Nelva Bush, MD;  Location: [place] CV LAB;  Service: Cardiovascular;  Laterality: N/A;   CORONARY STENT INTERVENTION  01/18/2020   CORONARY STENT INTERVENTION N/A 01/18/2020   Procedure: CORONARY STENT INTERVENTION;  Surgeon: Nelva Bush, MD;  Location: [place] CV LAB;  Service: Cardiovascular;  Laterality: N/A;   EYE SURGERY Left    FIDUCIAL MARKER PLACEMENT  04/09/2020   Procedure: FIDUCIAL MARKER PLACEMENT;  Surgeon: Collene Gobble, MD;  Location: [facility] ENDOSCOPY;  Service: Pulmonary;;   FINE NEEDLE ASPIRATION  04/09/2020   Procedure: FINE NEEDLE ASPIRATION (FNA) LINEAR;  Surgeon: Collene Gobble, MD;  Location: [place] ENDOSCOPY;  Service: Pulmonary;;   HAND SURGERY Right    thumb   INTRAVASCULAR PRESSURE WIRE/FFR STUDY N/A 11/02/2019   Procedure: INTRAVASCULAR PRESSURE WIRE/FFR STUDY;  Surgeon: Nelva Bush, MD;  Location: [place] CV LAB;  Service: Cardiovascular;  Laterality: N/A;   INTRAVASCULAR ULTRASOUND/IVUS N/A 01/18/2020   Procedure: Intravascular Ultrasound/IVUS;  Surgeon: Nelva Bush, MD;  Location: [place] CV LAB;  Service:  Cardiovascular;  Laterality: N/A;   LUMBAR LAMINECTOMY/DECOMPRESSION MICRODISCECTOMY N/A 02/07/2014   Procedure: LUMBAR DECOMPRESSION Lumbar one-Lumbar five;  Surgeon: [name], MD;  Location: [facility];  Service: Orthopedics;  Laterality: N/A;   RIGHT/LEFT HEART CATH AND CORONARY ANGIOGRAPHY N/A 11/02/2019   Procedure: RIGHT/LEFT HEART CATH AND CORONARY ANGIOGRAPHY;  Surgeon: Nelva Bush, MD;  Location: [place] CV LAB;  Service: Cardiovascular;  Laterality: N/A;   SHOULDER ARTHROSCOPY WITH SUBACROMIAL DECOMPRESSION Left 04/12/2014   Procedure: LEFT SHOULDER ARTHROSCOPY WITH SUBACROMIAL DECOMPRESSION AND LABRAL DEBRIDEMENT, ROTATOR CUFF DEBRIDEMENT, BICEPS DEBRIDEMENT;  Surgeon: [name], MD;  Location: [facility];  Service: Orthopedics;  Laterality: Left;  VIDEO BRONCHOSCOPY WITH ENDOBRONCHIAL NAVIGATION N/A 04/09/2020   Procedure: VIDEO BRONCHOSCOPY WITH ENDOBRONCHIAL NAVIGATION;  Surgeon: Collene Gobble, MD;  Location: [place] ENDOSCOPY;  Service: Pulmonary;  Laterality: N/A;   VIDEO BRONCHOSCOPY WITH ENDOBRONCHIAL ULTRASOUND N/A 04/09/2020   Procedure: VIDEO BRONCHOSCOPY WITH ENDOBRONCHIAL ULTRASOUND;  Surgeon: Collene Gobble, MD;  Location: [facility] [facility] ENDOSCOPY;  Service: Pulmonary;  Laterality: N/A;   VIDEO BRONCHOSCOPY WITH RADIAL ENDOBRONCHIAL ULTRASOUND  04/09/2020   Procedure: VIDEO BRONCHOSCOPY WITH RADIAL ENDOBRONCHIAL ULTRASOUND;  Surgeon: Collene Gobble, MD;  Location: [place] ENDOSCOPY;  Service: Pulmonary;;     Social History:  The patient  reports that he has been smoking cigarettes. He has a 27.00 pack-year smoking history. He has never used smokeless tobacco. He reports that he does not drink alcohol and does not use drugs.   Family History:  The patient's Family history is unknown by patient.    ROS:  Please see the history of present illness. All other systems are reviewed and  Negative to the above problem except as noted.    PHYSICAL EXAM: VS:  BP 136/66    Pulse 68     Ht 5\' 5"  (1.651 m)    Wt 256 lb (116.1 kg)    SpO2 97%    BMI 42.60 kg/m    GEN: Morbidly obese in no acute distress  HEENT: normal  Neck: JVP is normal   Cardiac: RRR; no murmurs, rubs, or gallops,no LE edema Respiratory: Some decreased airflow   No rales    GI: soft, nontender, distended because of obesity MS: no deformity Moving all extremities   Skin: warm and dry, no rash   EKG:  EKG is not ordered today.   Lipid Panel    Component Value Date/Time   CHOL 142 01/19/2020 0357   TRIG 139 01/19/2020 0357   HDL 36 (L) 01/19/2020 0357   CHOLHDL 3.9 01/19/2020 0357   VLDL 28 01/19/2020 0357   LDLCALC 78 01/19/2020 0357      Wt Readings from Last 3 Encounters:  07/01/20 256 lb (116.1 kg)  04/15/20 (!) 251 lb 15.8 oz (114.3 kg)  04/09/20 247 lb (112 kg)      ASSESSMENT AND PLAN:  1 CAD  Pt still with dyspnea   No CP   I would recomm trying amlodipine as 5 bid for better vasodilation    Follow   2 Dyspnea  Breathing remains stable  Still SOB with activity  Continues to follow in pulmonary   Counselled on tobacco   WIll check BMET and BNP    3 HTN  Increase amlodipine as noted   I do not think this will be too much for him    4   PAF   No recurrence  Keep on same regimen    5  HL   Continue Crestor       Current medicines are reviewed at length with the patient today.  The patient does not have concerns regarding medicines.  Signed, [name], MD  07/01/2020 9:22 AM    [facility] [address], [place], [place]  [address] Phone: [phone]; Fax: [phone]

## 2020-07-02 DIAGNOSIS — G4733 Obstructive sleep apnea (adult) (pediatric): Secondary | ICD-10-CM | POA: Diagnosis not present

## 2020-07-02 DIAGNOSIS — R0902 Hypoxemia: Secondary | ICD-10-CM | POA: Diagnosis not present

## 2020-07-03 ENCOUNTER — Encounter: Payer: Self-pay | Admitting: Emergency Medicine

## 2020-07-03 ENCOUNTER — Other Ambulatory Visit: Payer: Self-pay

## 2020-07-03 ENCOUNTER — Ambulatory Visit (INDEPENDENT_AMBULATORY_CARE_PROVIDER_SITE_OTHER): Payer: Medicare Other | Admitting: Emergency Medicine

## 2020-07-03 DIAGNOSIS — J449 Chronic obstructive pulmonary disease, unspecified: Secondary | ICD-10-CM

## 2020-07-03 DIAGNOSIS — I255 Ischemic cardiomyopathy: Secondary | ICD-10-CM | POA: Diagnosis not present

## 2020-07-03 DIAGNOSIS — R911 Solitary pulmonary nodule: Secondary | ICD-10-CM

## 2020-07-03 NOTE — Assessment & Plan Note (Signed)
Has chronic dyspnea.  He has not benefited from scheduled bronchodilators in the past, does not get much benefit from albuterol.  I will hold off on bronchodilators right now.  We need to repeat his pulmonary function testing going forward.

## 2020-07-03 NOTE — Assessment & Plan Note (Signed)
Hypermetabolic right upper lobe peripheral pulmonary nodule, increased in size on most recent CT now 1.0 x 1.0 cm.  Suspicion for stage I malignancy is fairly high although he had a negative bronchoscopy in July.  Talked to him about the options today I am not sure he is a great surgical candidate especially given his NSIP, COPD, continued smoking, CAD.  I recommended that we review the films with IR, see if he is a candidate for TTNA.  If not then we will try to arrange a repeat navigational bronchoscopy.

## 2020-07-03 NOTE — Progress Notes (Signed)
Subjective:    Patient ID: YU PEGGS, male    DOB: 16-Jul-1956, 64 y.o.   MRN: [id number]  HPI 64 year old active smoker (76 pack yrs) with CAD status post stent placement to the LAD 4/21, GERD, hypertension, left shoulder impingement syndrome, OSA not on CPAP. He has been followed here for COPD, also noted to have ILD on CT chest in an NSIP pattern.   He reports that he has experience slow progressive exertional SOB that occurs mainly with lifting and moving his arms or lifting - even w washing his hair. He becomes SOB with an incline. He is able to walk indefinitely on flat ground. He has cough in the am - thick and white. He has wheeze much of the day. Sounds like some possible UA noise - wife can hear it.  He was tried on Stiolto to see if he would get benefit >> didn't really notice any benefit with exertion so he stopped. He has uses albuterol - may have benefited some.   He is planning to have a repeat PSG soon - is willing to retry CPAP.  He is also about to start cardiopulm rehab. Follows with Dr Harrington Challenger.   High-resolution CT scan of the chest from 09/08/2018 reviewed by me showed an NSIP pattern with associated mild centrilobular paraseptal emphysema and bronchial wall thickening.  Little change from 2017  Reviewed CT scans of the chest 03/13/2019 and 06/06/2019.  The initial scan showed an irregular 8 mm right upper lobe nodule superimposed on his chronic interstitial disease.  The repeat scan in September showed decrease in size 6 x 6 mm consistent with a benign cause.  Pulmonary function testing reviewed by me from 08/22/2018 show grossly normal airflows with evidence for mild obstruction based on curve his flow volume loop.  Normal lung volumes and a decreased diffusion capacity that corrects to the normal range when adjusted for alveolar volume.  ROV 07/03/20 --follow-up visit for 64 year old gentleman with tobacco abuse (85 pack years), CAD, hypertension, COPD and ILD (NSIP  pattern).  We performed a CT chest to follow his ILD which showed possible increase in size in his right upper lobe pulmonary nodule.  This prompted a PET scan which was done on 03/26/2020.  There was moderate hypermetabolism,  suspicious for malignancy.  He underwent navigational bronchoscopy on 04/09/2020.  The cytology and pathology was negative.  We agreed to repeat his CT which was done on 07/01/2020 and I have reviewed, shows further slight increase in size in the spiculated right upper lobe pulmonary nodule, now 1.0 x 1.0 cm.  There is mild paraseptal emphysema unchanged mild pattern of pulmonary fibrosis and NSIP.  He has grossly normal airflows by PFT December 2019, evidence for mild obstruction, none since.   Review of Systems As per HPI       Objective:   Physical Exam Vitals:   07/03/20 0910  BP: 110/68  Pulse: 75  Temp: 97.7 F (36.5 C)  TempSrc: Oral  SpO2: 96%  Weight: 253 lb 9.6 oz (115 kg)  Height: 5\' 4"  (1.626 m)    Gen: Pleasant, well-nourished, in no distress,  normal affect  ENT: No lesions,  mouth clear,  oropharynx clear, no postnasal drip  Neck: No JVD, no stridor  Lungs: No use of accessory muscles, no crackles or wheezing on normal respiration, no wheeze on forced expiration  Cardiovascular: RRR, heart sounds normal, no murmur or gallops, no peripheral edema  Musculoskeletal: No deformities, no cyanosis or clubbing  Neuro: alert, awake, non focal  Skin: Warm, no lesions or rash      Assessment & Plan:  Pulmonary nodule Hypermetabolic right upper lobe peripheral pulmonary nodule, increased in size on most recent CT now 1.0 x 1.0 cm.  Suspicion for stage I malignancy is fairly high although he had a negative bronchoscopy in July.  Talked to him about the options today I am not sure he is a great surgical candidate especially given his NSIP, COPD, continued smoking, CAD.  I recommended that we review the films with IR, see if he is a candidate for TTNA.   If not then we will try to arrange a repeat navigational bronchoscopy.  COPD 0/ still smoking  Has chronic dyspnea.  He has not benefited from scheduled bronchodilators in the past, does not get much benefit from albuterol.  I will hold off on bronchodilators right now.  We need to repeat his pulmonary function testing going forward.  Baltazar Apo, MD, PhD 07/03/2020, 9:50 AM [facility] [phone] or if no answer [phone]

## 2020-07-03 NOTE — H&P (View-Only) (Signed)
Subjective:    Patient ID: Daniel Ball, male    DOB: Nov 30, 1955, 64 y.o.   MRN: [id number]  HPI 64 year old active smoker (55 pack yrs) with CAD status post stent placement to the LAD 4/21, GERD, hypertension, left shoulder impingement syndrome, OSA not on CPAP. He has been followed here for COPD, also noted to have ILD on CT chest in an NSIP pattern.   He reports that he has experience slow progressive exertional SOB that occurs mainly with lifting and moving his arms or lifting - even w washing his hair. He becomes SOB with an incline. He is able to walk indefinitely on flat ground. He has cough in the am - thick and white. He has wheeze much of the day. Sounds like some possible UA noise - wife can hear it.  He was tried on Stiolto to see if he would get benefit >> didn't really notice any benefit with exertion so he stopped. He has uses albuterol - may have benefited some.   He is planning to have a repeat PSG soon - is willing to retry CPAP.  He is also about to start cardiopulm rehab. Follows with Dr Harrington Challenger.   High-resolution CT scan of the chest from 09/08/2018 reviewed by me showed an NSIP pattern with associated mild centrilobular paraseptal emphysema and bronchial wall thickening.  Little change from 2017  Reviewed CT scans of the chest 03/13/2019 and 06/06/2019.  The initial scan showed an irregular 8 mm right upper lobe nodule superimposed on his chronic interstitial disease.  The repeat scan in September showed decrease in size 6 x 6 mm consistent with a benign cause.  Pulmonary function testing reviewed by me from 08/22/2018 show grossly normal airflows with evidence for mild obstruction based on curve his flow volume loop.  Normal lung volumes and a decreased diffusion capacity that corrects to the normal range when adjusted for alveolar volume.  ROV 07/03/20 --follow-up visit for 64 year old gentleman with tobacco abuse (85 pack years), CAD, hypertension, COPD and ILD (NSIP  pattern).  We performed a CT chest to follow his ILD which showed possible increase in size in his right upper lobe pulmonary nodule.  This prompted a PET scan which was done on 03/26/2020.  There was moderate hypermetabolism,  suspicious for malignancy.  He underwent navigational bronchoscopy on 04/09/2020.  The cytology and pathology was negative.  We agreed to repeat his CT which was done on 07/01/2020 and I have reviewed, shows further slight increase in size in the spiculated right upper lobe pulmonary nodule, now 1.0 x 1.0 cm.  There is mild paraseptal emphysema unchanged mild pattern of pulmonary fibrosis and NSIP.  He has grossly normal airflows by PFT December 2019, evidence for mild obstruction, none since.   Review of Systems As per HPI       Objective:   Physical Exam Vitals:   07/03/20 0910  BP: 110/68  Pulse: 75  Temp: 97.7 F (36.5 C)  TempSrc: Oral  SpO2: 96%  Weight: 253 lb 9.6 oz (115 kg)  Height: 5\' 4"  (1.626 m)    Gen: Pleasant, well-nourished, in no distress,  normal affect  ENT: No lesions,  mouth clear,  oropharynx clear, no postnasal drip  Neck: No JVD, no stridor  Lungs: No use of accessory muscles, no crackles or wheezing on normal respiration, no wheeze on forced expiration  Cardiovascular: RRR, heart sounds normal, no murmur or gallops, no peripheral edema  Musculoskeletal: No deformities, no cyanosis or clubbing  Neuro: alert, awake, non focal  Skin: Warm, no lesions or rash      Assessment & Plan:  Pulmonary nodule Hypermetabolic right upper lobe peripheral pulmonary nodule, increased in size on most recent CT now 1.0 x 1.0 cm.  Suspicion for stage I malignancy is fairly high although he had a negative bronchoscopy in July.  Talked to him about the options today I am not sure he is a great surgical candidate especially given his NSIP, COPD, continued smoking, CAD.  I recommended that we review the films with IR, see if he is a candidate for TTNA.   If not then we will try to arrange a repeat navigational bronchoscopy.  COPD 0/ still smoking  Has chronic dyspnea.  He has not benefited from scheduled bronchodilators in the past, does not get much benefit from albuterol.  I will hold off on bronchodilators right now.  We need to repeat his pulmonary function testing going forward.  Baltazar Apo, MD, PhD 07/03/2020, 9:50 AM [facility] [phone] or if no answer [phone]

## 2020-07-03 NOTE — Patient Instructions (Signed)
We will review your CT chest with interventional radiology to see if your right upper lobe pulmonary nodule is a good target for transthoracic needle biopsy.  If so we will work on scheduling this.  If the nodule is not reachable in that fashion then we will talk about rescheduling navigational bronchoscopy. We will repeat your pulmonary function testing at some point in the future Follow with Dr Lamonte Sakai in 1 month or next available

## 2020-07-10 NOTE — Telephone Encounter (Signed)
Thank you, I was going to call him today.  I reviewed his case with [place] last week.  The radiation oncologists felt that it would be optimal to have a tissue diagnosis before proceeding with any radiation treatments.  The interventional radiologist felt that his pulmonary nodule would be difficult to reach by them.  That makes repeat bronchoscopy probably the best test for [place] to do to further investigate the nodule.  I did discuss with them our potential options if we repeat the bronchoscopy and still do not get adequate samples.  Under those circumstances I believe the radiation oncologist would be willing to consider treatment without requiring any kind of repeat biopsies.  Based on all of this I think it would be best for [place] to try and arrange a repeat bronchoscopy in November.  If he agrees then I will work on setting up a time that works for both of [place].

## 2020-07-10 NOTE — Telephone Encounter (Signed)
RB please advise. Thanks.  

## 2020-07-11 DIAGNOSIS — Z23 Encounter for immunization: Secondary | ICD-10-CM | POA: Diagnosis not present

## 2020-07-11 DIAGNOSIS — M5136 Other intervertebral disc degeneration, lumbar region: Secondary | ICD-10-CM | POA: Diagnosis not present

## 2020-07-11 DIAGNOSIS — R911 Solitary pulmonary nodule: Secondary | ICD-10-CM | POA: Diagnosis not present

## 2020-07-11 DIAGNOSIS — K219 Gastro-esophageal reflux disease without esophagitis: Secondary | ICD-10-CM | POA: Diagnosis not present

## 2020-07-12 NOTE — Telephone Encounter (Signed)
Dr. Byrum, please see mychart message sent by pt and advise. 

## 2020-07-12 NOTE — Telephone Encounter (Signed)
I think I responded to a similar message yesterday, so this may have already been addressed.   I reviewed his films at [facility] conference and the consensus was that his nodule was too difficult to reach by IR. The radiation oncologists want [place] to try anther bronchoscopy to get a tissue diagnosis. I want to try to schedule this in November if he agrees.

## 2020-07-22 NOTE — Telephone Encounter (Signed)
Dr. Byrum, please see pt's mychart message and advise. 

## 2020-07-23 ENCOUNTER — Other Ambulatory Visit: Payer: Self-pay | Admitting: Emergency Medicine

## 2020-07-23 DIAGNOSIS — R911 Solitary pulmonary nodule: Secondary | ICD-10-CM

## 2020-07-24 NOTE — Telephone Encounter (Addendum)
I have scheduled ENB for 11/9 at 9:15, covid test 11/6.  I have spoken to [place] in [place] at [facility] and he is going to burn disk and have it sent to [facility].  I spoke to pt & made him aware of appt info & gave him info regarding holding plavix and eliquis that is on order.

## 2020-07-27 ENCOUNTER — Other Ambulatory Visit (HOSPITAL_COMMUNITY)
Admission: RE | Admit: 2020-07-27 | Discharge: 2020-07-27 | Disposition: A | Discharge status: Home / Self Care | Payer: Medicare Other | Source: Ambulatory Visit | Attending: Emergency Medicine | Admitting: Emergency Medicine

## 2020-07-27 DIAGNOSIS — Z01812 Encounter for preprocedural laboratory examination: Secondary | ICD-10-CM | POA: Insufficient documentation

## 2020-07-27 DIAGNOSIS — Z20822 Contact with and (suspected) exposure to covid-19: Secondary | ICD-10-CM | POA: Diagnosis not present

## 2020-07-27 LAB — SARS CORONAVIRUS 2 (TAT 6-24 HRS): SARS Coronavirus 2: NEGATIVE

## 2020-07-29 ENCOUNTER — Encounter (HOSPITAL_COMMUNITY): Payer: Self-pay | Admitting: Emergency Medicine

## 2020-07-29 ENCOUNTER — Other Ambulatory Visit: Payer: Self-pay

## 2020-07-29 DIAGNOSIS — I10 Essential (primary) hypertension: Secondary | ICD-10-CM | POA: Diagnosis not present

## 2020-07-29 DIAGNOSIS — I25118 Atherosclerotic heart disease of native coronary artery with other forms of angina pectoris: Secondary | ICD-10-CM | POA: Diagnosis not present

## 2020-07-29 DIAGNOSIS — E782 Mixed hyperlipidemia: Secondary | ICD-10-CM | POA: Diagnosis not present

## 2020-07-29 DIAGNOSIS — J449 Chronic obstructive pulmonary disease, unspecified: Secondary | ICD-10-CM | POA: Diagnosis not present

## 2020-07-29 DIAGNOSIS — I480 Paroxysmal atrial fibrillation: Secondary | ICD-10-CM | POA: Diagnosis not present

## 2020-07-29 DIAGNOSIS — N400 Enlarged prostate without lower urinary tract symptoms: Secondary | ICD-10-CM | POA: Diagnosis not present

## 2020-07-29 DIAGNOSIS — F320 Major depressive disorder, single episode, mild: Secondary | ICD-10-CM | POA: Diagnosis not present

## 2020-07-29 DIAGNOSIS — I4891 Unspecified atrial fibrillation: Secondary | ICD-10-CM | POA: Diagnosis not present

## 2020-07-29 DIAGNOSIS — K219 Gastro-esophageal reflux disease without esophagitis: Secondary | ICD-10-CM | POA: Diagnosis not present

## 2020-07-29 NOTE — Progress Notes (Signed)
Anesthesia Chart Review: Same day workup  Follows with cardiology for history of CAD status post PCI with DES to the LAD 01/18/2020.  Per last cardiology visit with Dr. Harrington Challenger 07/01/2020, the patient continues to note SOB with exertion, no change since May.  He denied chest pain. Per note, no recurrence of PAF.  His DOE is felt to be pulmonary rather than cardiac, and he was advised to continue with pulmonology evaluation.  Patient underwent navigational bronchoscopy 04/09/2020 by Dr. Lamonte Sakai, cytology and pathology were negative.  However, he does have a hypermetabolic right upper lobe that has increased in size on most recent CT.  Suspicion for malignancy is high despite previous negative bronchoscopy in July.  Recommendation was for patient to undergo another bronchoscopy for tissue diagnosis prior to undergoing x-ray therapy.  Patient has OSA and recently underwent CPAP titration 03/10/2020 but did not tolerate device.  Will need DOS labs and eval.   EKG 02/02/2020: Sinus  Rhythm.  Rate 71. Low voltage in precordial leads.   PFT 08/22/18: FVC-%Pred-Pre Latest Units: % 79  FEV1-%Pred-Pre Latest Units: % 80  FEV1FVC-%Pred-Pre Latest Units: % 100  TLC % pred Latest Units: % 89  RV % pred Latest Units: % 110  DLCO unc % pred Latest Units: % 60    CT chest 03/04/20: IMPRESSION: 1. Irregular 9 mm solid peripheral apical right upper lobe pulmonary nodule, increased in size, worrisome for primary bronchogenic carcinoma. PET-CT suggested for further characterization. 2. Spectrum of findings compatible with fibrotic interstitial lung disease without frank honeycombing and without a clear apicobasilar gradient. No appreciable interval progression. Mild progression since the baseline 2017 high-resolution chest CT study. Findings are categorized as probable UIP per consensus guidelines: Diagnosis of Idiopathic Pulmonary Fibrosis: An Official ATS/ERS/JRS/ALAT Clinical Practice Guideline. [place], Iss 5, [phone], May 22 2017. 3. Three-vessel coronary atherosclerosis. 4. Aortic Atherosclerosis (ICD10-I70.0) and Emphysema (ICD10-J43.9).  Cath and PCI 01/18/20: 1. Multivessel coronary artery disease, including 80% proximal LAD stenosis previously shown to be hemodynamically significant by DFR. There is also moderate disease involving the distal LMCA/ostial LAD, mid/distal LAD, and OM1/OM2. 2. Mildly elevated left ventricular filling pressure. 3. Successful orbital atherectomy and IVUS guided PCI to the proximal/mid LAD using a Synergy 3.5 x 28 mm drug-eluting stent (postdilated to 4.1 mm) with 0% residual stenosis and TIMI-3 flow.  Recommendations: 1. Overnight extended recovery. 2. If no evidence of bleeding/vascular complications, anticipate restarting apixaban tomorrow. Would continue apixaban and clopidogrel for at least 6 months, after which time patient could be transitioned to apixaban and aspirin 81 mg daily. 3. Aggressive secondary prevention. Fasting lipid panel to be checked with morning labs to see if rosuvastatin needs to be increased.  TTE 11/17/19: 1. Left ventricular ejection fraction, by estimation, is 60 to 65%. The  left ventricle has normal function. The left ventricle has no regional  wall motion abnormalities. There is mild concentric left ventricular  hypertrophy. Left ventricular diastolic  parameters are indeterminate.  2. Right ventricular systolic function is normal. The right ventricular  size is normal.  3. The mitral valve is grossly normal. Trivial mitral valve  regurgitation.  4. The aortic valve is tricuspid. Aortic valve regurgitation is not  visualized. Mild aortic valve sclerosis is present, with no evidence of  aortic valve stenosis.  5. The inferior vena cava is normal in size with greater than 50%  respiratory variability, suggesting right atrial pressure of 3 mmHg.    Karoline Caldwell,  PA-C [facility] Stay  Center/Anesthesiology Phone [phone] 07/29/2020 4:41 PM

## 2020-07-29 NOTE — Anesthesia Preprocedure Evaluation (Deleted)
Anesthesia Evaluation    Airway        Dental   Pulmonary Current Smoker,           Cardiovascular hypertension,      Neuro/Psych    GI/Hepatic   Endo/Other    Renal/GU      Musculoskeletal   Abdominal   Peds  Hematology   Anesthesia Other Findings   Reproductive/Obstetrics                             Anesthesia Physical Anesthesia Plan  ASA:   Anesthesia Plan:    Post-op Pain Management:    Induction:   PONV Risk Score and Plan:   Airway Management Planned:   Additional Equipment:   Intra-op Plan:   Post-operative Plan:   Informed Consent:   Plan Discussed with:   Anesthesia Plan Comments: (PAT note by Karoline Caldwell, PA-C: Follows with cardiology for history of CAD status post PCI with DES to the LAD 01/18/2020.  Per last cardiology visit with Dr. Harrington Challenger 07/01/2020, the patient continues to note SOB with exertion, no change since May.  He denied chest pain. Per note, no recurrence of PAF.  His DOE is felt to be pulmonary rather than cardiac, and he was advised to continue with pulmonology evaluation.  Patient underwent navigational bronchoscopy 04/09/2020 by Dr. Lamonte Sakai, cytology and pathology were negative.  However, he does have a hypermetabolic right upper lobe that has increased in size on most recent CT.  Suspicion for malignancy is high despite previous negative bronchoscopy in July.  Recommendation was for patient to undergo another bronchoscopy for tissue diagnosis prior to undergoing x-ray therapy.  Patient has OSA and recently underwent CPAP titration 03/10/2020 but did not tolerate device.  Will need DOS labs and eval.   EKG 02/02/2020: Sinus  Rhythm.  Rate 71. Low voltage in precordial leads.   PFT 08/22/18: FVC-%Pred-Pre Latest Units: % 79 FEV1-%Pred-Pre Latest Units: % 80 FEV1FVC-%Pred-Pre Latest Units: % 100 TLC % pred Latest Units: % 89 RV % pred Latest Units:  % 110 DLCO unc % pred Latest Units: % 60   CT chest 03/04/20: IMPRESSION: 1. Irregular 9 mm solid peripheral apical right upper lobe pulmonary nodule, increased in size, worrisome for primary bronchogenic carcinoma. PET-CT suggested for further characterization. 2. Spectrum of findings compatible with fibrotic interstitial lung disease without frank honeycombing and without a clear apicobasilar gradient. No appreciable interval progression. Mild progression since the baseline 2017 high-resolution chest CT study. Findings are categorized as probable UIP per consensus guidelines: Diagnosis of Idiopathic Pulmonary Fibrosis: An Official ATS/ERS/JRS/ALAT Clinical Practice Guideline. [place], Iss 5, [phone], May 22 2017. 3. Three-vessel coronary atherosclerosis. 4. Aortic Atherosclerosis (ICD10-I70.0) and Emphysema (ICD10-J43.9).  Cath and PCI 01/18/20: Multivessel coronary artery disease, including 80% proximal LAD stenosis previously shown to be hemodynamically significant by DFR. There is also moderate disease involving the distal LMCA/ostial LAD, mid/distal LAD, and OM1/OM2. Mildly elevated left ventricular filling pressure. Successful orbital atherectomy and IVUS guided PCI to the proximal/mid LAD using a Synergy 3.5 x 28 mm drug-eluting stent (postdilated to 4.1 mm) with 0% residual stenosis and TIMI-3 flow.  Recommendations: Overnight extended recovery. If no evidence of bleeding/vascular complications, anticipate restarting apixaban tomorrow. Would continue apixaban and clopidogrel for at least 6 months, after which time patient could be transitioned to apixaban and aspirin 81 mg daily. Aggressive secondary prevention. Fasting lipid panel to be  checked with morning labs to see if rosuvastatin needs to be increased.  TTE 11/17/19: 1. Left ventricular ejection fraction, by estimation, is 60 to 65%. The  left ventricle has normal function. The left  ventricle has no regional  wall motion abnormalities. There is mild concentric left ventricular  hypertrophy. Left ventricular diastolic  parameters are indeterminate.  2. Right ventricular systolic function is normal. The right ventricular  size is normal.  3. The mitral valve is grossly normal. Trivial mitral valve  regurgitation.  4. The aortic valve is tricuspid. Aortic valve regurgitation is not  visualized. Mild aortic valve sclerosis is present, with no evidence of  aortic valve stenosis.  5. The inferior vena cava is normal in size with greater than 50%  respiratory variability, suggesting right atrial pressure of 3 mmHg.  )        Anesthesia Quick Evaluation

## 2020-07-29 NOTE — Progress Notes (Signed)
Spoke with patient for preop SDW call. Pt to arrive to [facility] at 0645. Plavix held 5 days prior to DOS, Eliquis held 2 days prior to [employer]. Instructions given on medications to take, NPO after midnight.

## 2020-07-30 ENCOUNTER — Ambulatory Visit (HOSPITAL_COMMUNITY): Payer: Medicare Other

## 2020-07-30 ENCOUNTER — Ambulatory Visit (HOSPITAL_COMMUNITY): Payer: Medicare Other | Admitting: Physician Assistant

## 2020-07-30 ENCOUNTER — Other Ambulatory Visit: Payer: Self-pay

## 2020-07-30 ENCOUNTER — Telehealth: Payer: Self-pay | Admitting: Emergency Medicine

## 2020-07-30 ENCOUNTER — Encounter (HOSPITAL_COMMUNITY): Payer: Self-pay | Admitting: Emergency Medicine

## 2020-07-30 ENCOUNTER — Encounter (HOSPITAL_COMMUNITY)
Admission: RE | Disposition: A | Discharge status: Home / Self Care | Payer: Self-pay | Source: Home / Self Care | Attending: Emergency Medicine

## 2020-07-30 ENCOUNTER — Ambulatory Visit (HOSPITAL_COMMUNITY)
Admission: RE | Admit: 2020-07-30 | Discharge: 2020-07-30 | Disposition: A | Discharge status: Home / Self Care | Payer: Medicare Other | Attending: Emergency Medicine | Admitting: Emergency Medicine

## 2020-07-30 DIAGNOSIS — I10 Essential (primary) hypertension: Secondary | ICD-10-CM | POA: Diagnosis not present

## 2020-07-30 DIAGNOSIS — R7303 Prediabetes: Secondary | ICD-10-CM | POA: Diagnosis not present

## 2020-07-30 DIAGNOSIS — K449 Diaphragmatic hernia without obstruction or gangrene: Secondary | ICD-10-CM | POA: Diagnosis not present

## 2020-07-30 DIAGNOSIS — J84112 Idiopathic pulmonary fibrosis: Secondary | ICD-10-CM | POA: Diagnosis not present

## 2020-07-30 DIAGNOSIS — R846 Abnormal cytological findings in specimens from respiratory organs and thorax: Secondary | ICD-10-CM | POA: Diagnosis not present

## 2020-07-30 DIAGNOSIS — K219 Gastro-esophageal reflux disease without esophagitis: Secondary | ICD-10-CM | POA: Diagnosis not present

## 2020-07-30 DIAGNOSIS — I4891 Unspecified atrial fibrillation: Secondary | ICD-10-CM | POA: Diagnosis not present

## 2020-07-30 DIAGNOSIS — Z955 Presence of coronary angioplasty implant and graft: Secondary | ICD-10-CM | POA: Diagnosis not present

## 2020-07-30 DIAGNOSIS — Z6841 Body Mass Index (BMI) 40.0 and over, adult: Secondary | ICD-10-CM | POA: Insufficient documentation

## 2020-07-30 DIAGNOSIS — R911 Solitary pulmonary nodule: Secondary | ICD-10-CM | POA: Diagnosis not present

## 2020-07-30 DIAGNOSIS — I2510 Atherosclerotic heart disease of native coronary artery without angina pectoris: Secondary | ICD-10-CM | POA: Diagnosis not present

## 2020-07-30 DIAGNOSIS — F17210 Nicotine dependence, cigarettes, uncomplicated: Secondary | ICD-10-CM | POA: Insufficient documentation

## 2020-07-30 DIAGNOSIS — J8410 Pulmonary fibrosis, unspecified: Secondary | ICD-10-CM | POA: Diagnosis not present

## 2020-07-30 DIAGNOSIS — C3490 Malignant neoplasm of unspecified part of unspecified bronchus or lung: Secondary | ICD-10-CM | POA: Diagnosis present

## 2020-07-30 DIAGNOSIS — F17200 Nicotine dependence, unspecified, uncomplicated: Secondary | ICD-10-CM | POA: Diagnosis not present

## 2020-07-30 DIAGNOSIS — J439 Emphysema, unspecified: Secondary | ICD-10-CM | POA: Insufficient documentation

## 2020-07-30 DIAGNOSIS — G4733 Obstructive sleep apnea (adult) (pediatric): Secondary | ICD-10-CM | POA: Diagnosis not present

## 2020-07-30 DIAGNOSIS — Z9889 Other specified postprocedural states: Secondary | ICD-10-CM

## 2020-07-30 HISTORY — PX: VIDEO BRONCHOSCOPY WITH ENDOBRONCHIAL NAVIGATION: SHX6175

## 2020-07-30 HISTORY — PX: BRONCHIAL WASHINGS: SHX5105

## 2020-07-30 HISTORY — PX: BRONCHIAL BRUSHINGS: SHX5108

## 2020-07-30 HISTORY — PX: BRONCHIAL NEEDLE ASPIRATION BIOPSY: SHX5106

## 2020-07-30 LAB — BASIC METABOLIC PANEL
Anion gap: 10 (ref 5–15)
BUN: 13 mg/dL (ref 8–23)
CO2: 25 mmol/L (ref 22–32)
Calcium: 9.2 mg/dL (ref 8.9–10.3)
Chloride: 104 mmol/L (ref 98–111)
Creatinine, Ser: 1.07 mg/dL (ref 0.61–1.24)
GFR, Estimated: >60 mL/min (ref >60)
Glucose, Bld: 141 mg/dL — ABNORMAL HIGH (ref 70–99)
Potassium: 4.2 mmol/L (ref 3.5–5.1)
Sodium: 139 mmol/L (ref 135–145)

## 2020-07-30 LAB — CBC
HCT: 43.6 % (ref 39.0–52.0)
Hemoglobin: 14.3 g/dL (ref 13.0–17.0)
MCH: 31 pg (ref 26.0–34.0)
MCHC: 32.8 g/dL (ref 30.0–36.0)
MCV: 94.4 fL (ref 80.0–100.0)
Platelets: 115 10*3/uL — ABNORMAL LOW (ref 150–400)
RBC: 4.62 MIL/uL (ref 4.22–5.81)
RDW: 12.7 % (ref 11.5–15.5)
WBC: 7.6 10*3/uL (ref 4.0–10.5)
nRBC: 0 % (ref 0.0–0.2)

## 2020-07-30 LAB — GLUCOSE, CAPILLARY: Glucose-Capillary: 156 mg/dL — ABNORMAL HIGH (ref 70–99)

## 2020-07-30 LAB — PROTIME-INR
INR: 1 (ref 0.8–1.2)
Prothrombin Time: 12.6 seconds (ref 11.4–15.2)

## 2020-07-30 LAB — APTT: aPTT: 30 seconds (ref 24–36)

## 2020-07-30 SURGERY — VIDEO BRONCHOSCOPY WITH ENDOBRONCHIAL NAVIGATION
Anesthesia: General | Laterality: Right

## 2020-07-30 MED ORDER — CHLORHEXIDINE GLUCONATE 0.12 % MT SOLN
OROMUCOSAL | Status: AC
  Administered 2020-07-30: 15 mL
  Filled 2020-07-30: qty 15

## 2020-07-30 MED ORDER — FENTANYL CITRATE (PF) 100 MCG/2ML IJ SOLN: 25.0000 ug | INTRAMUSCULAR | Status: DC | PRN

## 2020-07-30 MED ORDER — MIDAZOLAM HCL 5 MG/5ML IJ SOLN
INTRAMUSCULAR | Status: DC | PRN
  Administered 2020-07-30: 2 mg via INTRAVENOUS

## 2020-07-30 MED ORDER — SUCCINYLCHOLINE CHLORIDE 200 MG/10ML IV SOSY
PREFILLED_SYRINGE | INTRAVENOUS | Status: DC | PRN
  Administered 2020-07-30: 140 mg via INTRAVENOUS

## 2020-07-30 MED ORDER — ROCURONIUM BROMIDE 10 MG/ML (PF) SYRINGE
PREFILLED_SYRINGE | INTRAVENOUS | Status: DC | PRN
  Administered 2020-07-30: 50 mg via INTRAVENOUS

## 2020-07-30 MED ORDER — PHENYLEPHRINE HCL-NACL 10-0.9 MG/250ML-% IV SOLN
INTRAVENOUS | Status: DC | PRN
  Administered 2020-07-30: 30 ug/min via INTRAVENOUS

## 2020-07-30 MED ORDER — LACTATED RINGERS IV SOLN: INTRAVENOUS | Status: DC

## 2020-07-30 MED ORDER — DEXTROSE 5 % IV SOLN
3.0000 g | INTRAVENOUS | Status: DC
  Filled 2020-07-30: qty 3000

## 2020-07-30 MED ORDER — ONDANSETRON HCL 4 MG/2ML IJ SOLN
INTRAMUSCULAR | Status: DC | PRN
  Administered 2020-07-30: 4 mg via INTRAVENOUS

## 2020-07-30 MED ORDER — FENTANYL CITRATE (PF) 250 MCG/5ML IJ SOLN
INTRAMUSCULAR | Status: DC | PRN
  Administered 2020-07-30: 100 ug via INTRAVENOUS

## 2020-07-30 MED ORDER — DEXAMETHASONE SODIUM PHOSPHATE 10 MG/ML IJ SOLN
INTRAMUSCULAR | Status: DC | PRN
  Administered 2020-07-30: 5 mg via INTRAVENOUS

## 2020-07-30 MED ORDER — ACETAMINOPHEN 500 MG PO TABS
1000.0000 mg | ORAL_TABLET | Freq: Once | ORAL | Status: AC
  Administered 2020-07-30: 1000 mg via ORAL
  Filled 2020-07-30: qty 2

## 2020-07-30 MED ORDER — NITROGLYCERIN 0.4 MG SL SUBL: 0.4000 mg | SUBLINGUAL_TABLET | SUBLINGUAL | Status: DC | PRN

## 2020-07-30 MED ORDER — METOPROLOL SUCCINATE ER 25 MG PO TB24
25.0000 mg | ORAL_TABLET | ORAL | Status: AC
  Administered 2020-07-30: 25 mg via ORAL
  Filled 2020-07-30: qty 1

## 2020-07-30 MED ORDER — FUROSEMIDE 40 MG PO TABS
40.0000 mg | ORAL_TABLET | Freq: Every day | ORAL | Status: DC | PRN
Start: 2020-07-30 — End: 2021-07-15

## 2020-07-30 MED ORDER — ALBUTEROL SULFATE HFA 108 (90 BASE) MCG/ACT IN AERS: 2.0000 | INHALATION_SPRAY | Freq: Four times a day (QID) | RESPIRATORY_TRACT | Status: DC | PRN

## 2020-07-30 MED ORDER — PROPOFOL 10 MG/ML IV BOLUS
INTRAVENOUS | Status: DC | PRN
  Administered 2020-07-30: 150 mg via INTRAVENOUS

## 2020-07-30 MED ORDER — PROMETHAZINE HCL 25 MG/ML IJ SOLN: 6.2500 mg | INTRAMUSCULAR | Status: DC | PRN

## 2020-07-30 MED ORDER — APIXABAN 5 MG PO TABS
5.0000 mg | ORAL_TABLET | Freq: Two times a day (BID) | ORAL | 5 refills | Status: DC
Start: 2020-07-30 — End: 2020-11-05

## 2020-07-30 MED ORDER — CLOPIDOGREL BISULFATE 75 MG PO TABS
75.0000 mg | ORAL_TABLET | Freq: Every day | ORAL | Status: DC
Start: 2020-07-30 — End: 2021-03-03

## 2020-07-30 MED ORDER — CELECOXIB 200 MG PO CAPS
200.0000 mg | ORAL_CAPSULE | Freq: Once | ORAL | Status: AC
  Administered 2020-07-30: 200 mg via ORAL
  Filled 2020-07-30: qty 1

## 2020-07-30 MED ORDER — SUGAMMADEX SODIUM 200 MG/2ML IV SOLN
INTRAVENOUS | Status: DC | PRN
  Administered 2020-07-30: 300 mg via INTRAVENOUS

## 2020-07-30 MED ORDER — ALBUTEROL SULFATE HFA 108 (90 BASE) MCG/ACT IN AERS
INHALATION_SPRAY | RESPIRATORY_TRACT | Status: DC | PRN
  Administered 2020-07-30: 2 via RESPIRATORY_TRACT

## 2020-07-30 NOTE — Op Note (Signed)
Video Bronchoscopy with Electromagnetic Navigation Procedure Note  Date of Operation: 07/30/2020  Pre-op Diagnosis: Right upper lobe pulmonary nodule  Post-op Diagnosis: Same  Surgeon: Baltazar Apo  Assistants: None  Anesthesia: General endotracheal anesthesia  Operation: Flexible video fiberoptic bronchoscopy with electromagnetic navigation and biopsies.  Estimated Blood Loss: Minimal  Complications: None apparent  Indications and History: Daniel Ball is a 64 y.o. male with history tobacco.  He has a slowly enlarging right upper lobe pulmonary nodule on serial imaging.  He underwent a nondiagnostic navigational bronchoscopy in July 2021.  In the interim the nodule has grown from 0.8 cm to 1.0 cm in largest diameter.  Recommendation was made to repeat his navigational bronchoscopy to attempt to achieve tissue diagnosis.  The risks, benefits, complications, treatment options and expected outcomes were discussed with the patient.  The possibilities of pneumothorax, pneumonia, reaction to medication, pulmonary aspiration, perforation of a viscus, bleeding, failure to diagnose a condition and creating a complication requiring transfusion or operation were discussed with the patient who freely signed the consent.    Description of Procedure: The patient was seen in the Preoperative Area, was examined and was deemed appropriate to proceed.  The patient was taken to [facility] endoscopy room 2, identified as Daniel Ball and the procedure verified as Flexible Video Fiberoptic Bronchoscopy.  A Time Out was held and the above information confirmed.   Prior to the date of the procedure a high-resolution CT scan of the chest was performed. Utilizing [place] a virtual tracheobronchial tree was generated to allow the creation of distinct navigation pathways to the patient's parenchymal abnormalities. After being taken to the operating room general anesthesia was initiated and the  patient  was orally intubated. The video fiberoptic bronchoscope was introduced via the endotracheal tube and a general inspection was performed which showed some tracheobronchial ectasia without any evidence of endobronchial lesion or abnormal secretions. The extendable working channel and locator guide were introduced into the bronchoscope. The distinct navigation pathways prepared prior to this procedure were then utilized to navigate to within 0.5 cm of the patient's right upper lobe nodule  identified on CT scan. The extendable working channel was secured into place and the locator guide was withdrawn. Under fluoroscopic guidance transbronchial brushings, transbronchial Wang needle biopsies were performed to be sent for cytology.  At the end of the procedure a general airway inspection was performed and there was no evidence of active bleeding. The bronchoscope was removed.  The patient tolerated the procedure well. There was no significant blood loss and there were no obvious complications. A post-procedural chest x-ray is pending.  Samples: 1. Transbronchial brushings from right upper lobe nodule 2. Transbronchial Wang needle biopsies from right upper lobe nodule 3. Bronchoalveolar lavage from right upper lobe  Plans:  The patient will be discharged from the PACU to home when recovered from anesthesia and after chest x-ray is reviewed. We will review the cytology, pathology and microbiology results with the patient when they become available. Outpatient followup will be with Dr. Lamonte Sakai.     Baltazar Apo, MD, PhD 07/30/2020, 11:26 AM [place] [facility] [phone] or if no answer [phone]

## 2020-07-30 NOTE — Anesthesia Preprocedure Evaluation (Addendum)
Anesthesia Evaluation  Patient identified by MRN, date of birth, ID band Patient awake    Reviewed: Allergy & Precautions, H&P , NPO status , Patient's Chart, lab work & pertinent test results  History of Anesthesia Complications Negative for: history of anesthetic complications  Airway Mallampati: I  TM Distance: >3 FB Neck ROM: Full    Dental  (+) Edentulous Upper, Edentulous Lower, Dental Advisory Given   Pulmonary shortness of breath and with exertion, sleep apnea , COPD,  COPD inhaler, Current SmokerPatient did not abstain from smoking.,    Pulmonary exam normal        Cardiovascular hypertension, Pt. on medications and Pt. on home beta blockers + CAD and + Cardiac Stents  Normal cardiovascular exam     Neuro/Psych negative neurological ROS  negative psych ROS   GI/Hepatic Neg liver ROS, GERD  Medicated and Controlled,  Endo/Other  Morbid obesity  Renal/GU negative Renal ROS  negative genitourinary   Musculoskeletal  (+) Arthritis , Osteoarthritis,    Abdominal   Peds  Hematology negative hematology ROS (+)   Anesthesia Other Findings   Reproductive/Obstetrics negative OB ROS                            Anesthesia Physical  Anesthesia Plan  ASA: III  Anesthesia Plan: General   Post-op Pain Management:    Induction: Intravenous  PONV Risk Score and Plan: 2 and Ondansetron, Dexamethasone and Midazolam  Airway Management Planned: Oral ETT  Additional Equipment:   Intra-op Plan:   Post-operative Plan: Extubation in OR  Informed Consent: I have reviewed the patients History and Physical, chart, labs and discussed the procedure including the risks, benefits and alternatives for the proposed anesthesia with the patient or authorized representative who has indicated his/her understanding and acceptance.     Dental advisory given  Plan Discussed with: CRNA and  Anesthesiologist  Anesthesia Plan Comments: (PAT note by Karoline Caldwell, PA-C: Follows with cardiology for history of CAD status post PCI with DES to the LAD 01/18/2020.  Per last cardiology visit with Dr. Harrington Challenger 07/01/2020, the patient continues to note SOB with exertion, no change since May.  He denied chest pain. Per note, no recurrence of PAF.  His DOE is felt to be pulmonary rather than cardiac, and he was advised to continue with pulmonology evaluation.  Patient underwent navigational bronchoscopy 04/09/2020 by Dr. Lamonte Sakai, cytology and pathology were negative.  However, he does have a hypermetabolic right upper lobe that has increased in size on most recent CT.  Suspicion for malignancy is high despite previous negative bronchoscopy in July.  Recommendation was for patient to undergo another bronchoscopy for tissue diagnosis prior to undergoing x-ray therapy.  Patient has OSA and recently underwent CPAP titration 03/10/2020 but did not tolerate device.  Will need DOS labs and eval.   EKG 02/02/2020: Sinus  Rhythm.  Rate 71. Low voltage in precordial leads.   PFT 08/22/18: FVC-%Pred-Pre Latest Units: % 79 FEV1-%Pred-Pre Latest Units: % 80 FEV1FVC-%Pred-Pre Latest Units: % 100 TLC % pred Latest Units: % 89 RV % pred Latest Units: % 110 DLCO unc % pred Latest Units: % 60   CT chest 03/04/20: IMPRESSION: 1. Irregular 9 mm solid peripheral apical right upper lobe pulmonary nodule, increased in size, worrisome for primary bronchogenic carcinoma. PET-CT suggested for further characterization. 2. Spectrum of findings compatible with fibrotic interstitial lung disease without frank honeycombing and without a clear apicobasilar gradient. No  appreciable interval progression. Mild progression since the baseline 2017 high-resolution chest CT study. Findings are categorized as probable UIP per consensus guidelines: Diagnosis of Idiopathic Pulmonary Fibrosis: An Official ATS/ERS/JRS/ALAT  Clinical Practice Guideline. [place], Iss 5, [phone], May 22 2017. 3. Three-vessel coronary atherosclerosis. 4. Aortic Atherosclerosis (ICD10-I70.0) and Emphysema (ICD10-J43.9).  Cath and PCI 01/18/20: Multivessel coronary artery disease, including 80% proximal LAD stenosis previously shown to be hemodynamically significant by DFR. There is also moderate disease involving the distal LMCA/ostial LAD, mid/distal LAD, and OM1/OM2. Mildly elevated left ventricular filling pressure. Successful orbital atherectomy and IVUS guided PCI to the proximal/mid LAD using a Synergy 3.5 x 28 mm drug-eluting stent (postdilated to 4.1 mm) with 0% residual stenosis and TIMI-3 flow.  Recommendations: Overnight extended recovery. If no evidence of bleeding/vascular complications, anticipate restarting apixaban tomorrow. Would continue apixaban and clopidogrel for at least 6 months, after which time patient could be transitioned to apixaban and aspirin 81 mg daily. Aggressive secondary prevention. Fasting lipid panel to be checked with morning labs to see if rosuvastatin needs to be increased.  TTE 11/17/19: 1. Left ventricular ejection fraction, by estimation, is 60 to 65%. The  left ventricle has normal function. The left ventricle has no regional  wall motion abnormalities. There is mild concentric left ventricular  hypertrophy. Left ventricular diastolic  parameters are indeterminate.  2. Right ventricular systolic function is normal. The right ventricular  size is normal.  3. The mitral valve is grossly normal. Trivial mitral valve  regurgitation.  4. The aortic valve is tricuspid. Aortic valve regurgitation is not  visualized. Mild aortic valve sclerosis is present, with no evidence of  aortic valve stenosis.  5. The inferior vena cava is normal in size with greater than 50%  respiratory variability, suggesting right atrial pressure of 3 mmHg.  )        Anesthesia Quick Evaluation

## 2020-07-30 NOTE — Anesthesia Postprocedure Evaluation (Signed)
Anesthesia Post Note  Patient: Daniel Ball  Procedure(s) Performed: VIDEO BRONCHOSCOPY WITH ENDOBRONCHIAL NAVIGATION (Right ) BRONCHIAL BRUSHINGS BRONCHIAL NEEDLE ASPIRATION BIOPSIES BRONCHIAL WASHINGS     Patient location during evaluation: PACU Anesthesia Type: General Level of consciousness: sedated Pain management: pain level controlled Vital Signs Assessment: post-procedure vital signs reviewed and stable Respiratory status: spontaneous breathing and respiratory function stable Cardiovascular status: stable Postop Assessment: no apparent nausea or vomiting Anesthetic complications: no   No complications documented.  Last Vitals:  Vitals:   07/30/20 1255 07/30/20 1310  BP: 114/60   Pulse: 61 63  Resp:  16  Temp:    SpO2: 95% 94%    Last Pain:  Vitals:   07/30/20 1310  TempSrc:   PainSc: 0-No pain                 Marycruz Boehner DANIEL

## 2020-07-30 NOTE — Interval H&P Note (Signed)
PCCM Interval Note  Daniel Ball presents today for further evaluation of a hypermetabolic small 1 cm pulmonary nodule in the right upper lobe.  He had bronchoscopy before that was nondiagnostic.  Based on the interval increase in size we have decided to pursue repeat navigational bronchoscopy with biopsies.  He stopped his Eliquis 2 days ago, Plavix 5 days ago.  He still smoking, has some intermittent dyspnea but no new symptoms to report.  He uses albuterol intermittently is not on any scheduled bronchodilators   Vitals:   07/30/20 0657  BP: (!) 148/76  Pulse: 72  Resp: 18  Temp: 98.2 F (36.8 C)  SpO2: 100%  Weight: 113.4 kg  Height: 5\' 4"  (1.626 m)   Overweight man, no distress, laying in bed comfortably.  Oropharynx clear, M3-4 airway, no stridor.  Lungs distant, clear without crackles or wheezing.  Heart regular without murmur.  Abdomen is obese, nondistended with positive bowel sounds.  No significant lower extremity edema.  Labs are pending this morning  Impression/plan: Hypermetabolic 1.0 cm right upper lobe pulmonary nodule, increasing in size in a gentleman with history of tobacco.  Moderate to high suspicion for non-small cell lung cancer.  Plan for navigational bronchoscopy assuming his lab work looks good.  We will review before proceeding.  He understands procedure, risk, benefits and agrees to proceed.  Baltazar Apo, MD, PhD 07/30/2020, 8:05 AM [place] [facility] [phone] or if no answer [phone]

## 2020-07-30 NOTE — Discharge Instructions (Signed)
Flexible Bronchoscopy, Care After This sheet gives you information about how to care for yourself after your test. Your doctor may also give you more specific instructions. If you have problems or questions, contact your doctor. Follow these instructions at home: Eating and drinking  Do not eat or drink anything (not even water) for 2 hours after your test, or until your numbing medicine (local anesthetic) wears off.  When your numbness is gone and your cough and gag reflexes have come back, you may: ? Eat only soft foods. ? Slowly drink liquids.  The day after the test, go back to your normal diet. Driving  Do not drive for 24 hours if you were given a medicine to help you relax (sedative).  Do not drive or use heavy machinery while taking prescription pain medicine. General instructions   Take over-the-counter and prescription medicines only as told by your doctor.  Return to your normal activities as told. Ask what activities are safe for you.  Do not use any products that have nicotine or tobacco in them. This includes cigarettes and e-cigarettes. If you need help quitting, ask your doctor.  Keep all follow-up visits as told by your doctor. This is important. It is very important if you had a tissue sample (biopsy) taken. Get help right away if:  You have shortness of breath that gets worse.  You get light-headed.  You feel like you are going to pass out (faint).  You have chest pain.  You cough up: ? More than a little blood. ? More blood than before. Summary  Do not eat or drink anything (not even water) for 2 hours after your test, or until your numbing medicine wears off.  Do not use cigarettes. Do not use e-cigarettes.  Get help right away if you have chest pain.  Okay to restart your Plavix and your Eliquis on 07/31/2020  Please call our office for any questions or concerns.  [phone].  This information is not intended to replace advice given to you  by your health care provider. Make sure you discuss any questions you have with your health care provider. Document Revised: 08/20/2017 Document Reviewed: 09/25/2016 Elsevier Patient Education  2020 [facility], Adult, Care After This sheet gives you information about how to care for yourself after your procedure. Your health care provider may also give you more specific instructions. If you have problems or questions, contact your health care provider. What can I expect after the procedure? After the procedure, the following side effects are common:  Pain or discomfort at the IV site.  Nausea.  Vomiting.  Sore throat.  Trouble concentrating.  Feeling cold or chills.  Weak or tired.  Sleepiness and fatigue.  Soreness and body aches. These side effects can affect parts of the body that were not involved in surgery. Follow these instructions at home:  For at least 24 hours after the procedure:  Have a responsible adult stay with you. It is important to have someone help care for you until you are awake and alert.  Rest as needed.  Do not: ? Participate in activities in which you could fall or become injured. ? Drive. ? Use heavy machinery. ? Drink alcohol. ? Take sleeping pills or medicines that cause drowsiness. ? Make important decisions or sign legal documents. ? Take care of children on your own. Eating and drinking  Follow any instructions from your health care provider about eating or drinking restrictions.  When you  feel hungry, start by eating small amounts of foods that are soft and easy to digest (bland), such as toast. Gradually return to your regular diet.  Drink enough fluid to keep your urine pale yellow.  If you vomit, rehydrate by drinking water, juice, or clear broth. General instructions  If you have sleep apnea, surgery and certain medicines can increase your risk for breathing problems. Follow instructions from your  health care provider about wearing your sleep device: ? Anytime you are sleeping, including during daytime naps. ? While taking prescription pain medicines, sleeping medicines, or medicines that make you drowsy.  Return to your normal activities as told by your health care provider. Ask your health care provider what activities are safe for you.  Take over-the-counter and prescription medicines only as told by your health care provider.  If you smoke, do not smoke without supervision.  Keep all follow-up visits as told by your health care provider. This is important. Contact a health care provider if:  You have nausea or vomiting that does not get better with medicine.  You cannot eat or drink without vomiting.  You have pain that does not get better with medicine.  You are unable to pass urine.  You develop a skin rash.  You have a fever.  You have redness around your IV site that gets worse. Get help right away if:  You have difficulty breathing.  You have chest pain.  You have blood in your urine or stool, or you vomit blood. Summary  After the procedure, it is common to have a sore throat or nausea. It is also common to feel tired.  Have a responsible adult stay with you for the first 24 hours after general anesthesia. It is important to have someone help care for you until you are awake and alert.  When you feel hungry, start by eating small amounts of foods that are soft and easy to digest (bland), such as toast. Gradually return to your regular diet.  Drink enough fluid to keep your urine pale yellow.  Return to your normal activities as told by your health care provider. Ask your health care provider what activities are safe for you. This information is not intended to replace advice given to you by your health care provider. Make sure you discuss any questions you have with your health care provider. Document Revised: 09/10/2017 Document Reviewed:  04/23/2017 Elsevier Patient Education  [place].

## 2020-07-30 NOTE — Anesthesia Procedure Notes (Signed)
Procedure Name: Intubation Date/Time: 07/30/2020 10:06 AM Performed by: Griffin Dakin, CRNA Pre-anesthesia Checklist: Patient identified, Emergency Drugs available, Suction available and Patient being monitored Patient Re-evaluated:Patient Re-evaluated prior to induction Oxygen Delivery Method: Circle system utilized Preoxygenation: Pre-oxygenation with 100% oxygen Induction Type: Rapid sequence Laryngoscope Size: Mac and 4 Grade View: Grade II Tube type: Oral Tube size: 8.0 mm Number of attempts: 1 Airway Equipment and Method: Stylet and Oral airway Placement Confirmation: ETT inserted through vocal cords under direct vision,  positive ETCO2 and breath sounds checked- equal and bilateral Secured at: 23 cm Tube secured with: Tape Dental Injury: Teeth and Oropharynx as per pre-operative assessment

## 2020-07-30 NOTE — Transfer of Care (Signed)
Immediate Anesthesia Transfer of Care Note  Patient: [name]  Procedure(s) Performed: VIDEO BRONCHOSCOPY WITH ENDOBRONCHIAL NAVIGATION (Right ) BRONCHIAL BRUSHINGS BRONCHIAL NEEDLE ASPIRATION BIOPSIES BRONCHIAL WASHINGS  Patient Location: PACU  Anesthesia Type:General  Level of Consciousness: awake, alert  and oriented  Airway & Oxygen Therapy: Patient Spontanous Breathing and Patient connected to face mask oxygen  Post-op Assessment: Report given to RN and Post -op Vital signs reviewed and stable  Post vital signs: Reviewed and stable  Last Vitals:  Vitals Value Taken Time  BP 126/67 07/30/20 1137  Temp 36.5 C 07/30/20 1137  Pulse 66 07/30/20 1138  Resp 10 07/30/20 1138  SpO2 94 % 07/30/20 1138  Vitals shown include unvalidated device data.  Last Pain:  Vitals:   07/30/20 1137  TempSrc: Oral  PainSc: 0-No pain         Complications: No complications documented.

## 2020-07-30 NOTE — Telephone Encounter (Signed)
Spoke with pt's wife, states that she spoke with RB today after pt's bronch about a handicap placard form.  RB advised wife to call our office to arrange this.   I have filled out form to best of my ability and placed it in RB's look-at folder.  Wife wishes to pick up this form from the office once it's completed.    Routing to RB's nurse for follow-up.  Wife aware that RB won't be in clinic until next week to sign form.

## 2020-07-31 ENCOUNTER — Encounter (HOSPITAL_COMMUNITY): Payer: Self-pay | Admitting: Emergency Medicine

## 2020-08-02 ENCOUNTER — Telehealth: Payer: Self-pay | Admitting: Emergency Medicine

## 2020-08-02 LAB — CYTOLOGY - NON PAP

## 2020-08-02 NOTE — Telephone Encounter (Signed)
Please let the patient know that his bronchoscopy washings show "atypical cells" but not enough to definitively prove a small lung cancer.   Dr Lamonte Sakai will plan to discuss this information with the thoracic oncology group to see if we have enough information to recommend radiation to that area. After that we can talk,  make a solid plan

## 2020-08-05 DIAGNOSIS — M545 Low back pain, unspecified: Secondary | ICD-10-CM | POA: Diagnosis not present

## 2020-08-05 DIAGNOSIS — M5136 Other intervertebral disc degeneration, lumbar region: Secondary | ICD-10-CM | POA: Diagnosis not present

## 2020-08-05 DIAGNOSIS — Z6841 Body Mass Index (BMI) 40.0 and over, adult: Secondary | ICD-10-CM | POA: Diagnosis not present

## 2020-08-05 NOTE — Telephone Encounter (Signed)
Spoke with pt and reviewed results of bronch washings. Pt stated understanding. Nothing further needed at this time.

## 2020-08-06 ENCOUNTER — Ambulatory Visit (INDEPENDENT_AMBULATORY_CARE_PROVIDER_SITE_OTHER): Payer: Medicare Other | Admitting: Emergency Medicine

## 2020-08-06 ENCOUNTER — Other Ambulatory Visit: Payer: Self-pay

## 2020-08-06 ENCOUNTER — Encounter: Payer: Self-pay | Admitting: Emergency Medicine

## 2020-08-06 DIAGNOSIS — R911 Solitary pulmonary nodule: Secondary | ICD-10-CM

## 2020-08-06 DIAGNOSIS — I255 Ischemic cardiomyopathy: Secondary | ICD-10-CM | POA: Diagnosis not present

## 2020-08-06 DIAGNOSIS — J449 Chronic obstructive pulmonary disease, unspecified: Secondary | ICD-10-CM

## 2020-08-06 MED ORDER — SPIRIVA RESPIMAT 2.5 MCG/ACT IN AERS: 2.0000 | INHALATION_SPRAY | Freq: Every day | RESPIRATORY_TRACT | 0 refills | Status: DC

## 2020-08-06 NOTE — Progress Notes (Signed)
Subjective:    Patient ID: Daniel Ball, male    DOB: Apr 09, 1956, 64 y.o.   MRN: [id number]  HPI 64 year old active smoker (15 pack yrs) with CAD status post stent placement to the LAD 4/21, GERD, hypertension, left shoulder impingement syndrome, OSA not on CPAP. He has been followed here for COPD, also noted to have ILD on CT chest in an NSIP pattern.   He reports that he has experience slow progressive exertional SOB that occurs mainly with lifting and moving his arms or lifting - even w washing his hair. He becomes SOB with an incline. He is able to walk indefinitely on flat ground. He has cough in the am - thick and white. He has wheeze much of the day. Sounds like some possible UA noise - wife can hear it.  He was tried on Stiolto to see if he would get benefit >> didn't really notice any benefit with exertion so he stopped. He has uses albuterol - may have benefited some.   He is planning to have a repeat PSG soon - is willing to retry CPAP.  He is also about to start cardiopulm rehab. Follows with Dr Harrington Challenger.   High-resolution CT scan of the chest from 09/08/2018 reviewed by me showed an NSIP pattern with associated mild centrilobular paraseptal emphysema and bronchial wall thickening.  Little change from 2017  Reviewed CT scans of the chest 03/13/2019 and 06/06/2019.  The initial scan showed an irregular 8 mm right upper lobe nodule superimposed on his chronic interstitial disease.  The repeat scan in September showed decrease in size 6 x 6 mm consistent with a benign cause.  Pulmonary function testing reviewed by me from 08/22/2018 show grossly normal airflows with evidence for mild obstruction based on curve his flow volume loop.  Normal lung volumes and a decreased diffusion capacity that corrects to the normal range when adjusted for alveolar volume.  ROV 07/03/20 --follow-up visit for 64 year old gentleman with tobacco abuse (85 pack years), CAD, hypertension, COPD and ILD (NSIP  pattern).  We performed a CT chest to follow his ILD which showed possible increase in size in his right upper lobe pulmonary nodule.  This prompted a PET scan which was done on 03/26/2020.  There was moderate hypermetabolism,  suspicious for malignancy.  He underwent navigational bronchoscopy on 04/09/2020.  The cytology and pathology was negative.  We agreed to repeat his CT which was done on 07/01/2020 and I have reviewed, shows further slight increase in size in the spiculated right upper lobe pulmonary nodule, now 1.0 x 1.0 cm.  There is mild paraseptal emphysema unchanged mild pattern of pulmonary fibrosis and NSIP.  He has grossly normal airflows by PFT December 2019, evidence for mild obstruction, none since.  ROV 08/06/20 -Daniel Ball is 64, former smoker with COPD and NSIP/ILD.  He has a slowly enlarging right upper lobe peripheral pulmonary nodule that is mildly hypermetabolic on PET scan, 1.0 x 1.0 cm on most recent imaging.  He underwent a repeat bronchoscopy on 07/30/2020.  Brushings were negative, cell block negative but washings showed atypical cells.  Most recent PFT from 08/22/2018 show an FEV1 of 2.22 L or 80% predicted, mixed obstruction and restriction.   Review of Systems As per HPI     Objective:   Physical Exam Vitals:   08/06/20 0847  BP: 122/76  Pulse: 73  Temp: 97.6 F (36.4 C)  SpO2: 98%  Weight: 256 lb 6.4 oz (116.3 kg)  Height: 5'  4" (1.626 m)    Gen: Pleasant, obese, in no distress,  normal affect  ENT: No lesions,  mouth clear,  oropharynx clear, no postnasal drip  Neck: No JVD, no stridor  Lungs: No use of accessory muscles, no crackles or wheezing on normal respiration, no wheeze on forced expiration  Cardiovascular: RRR, heart sounds normal, no murmur or gallops, no peripheral edema  Musculoskeletal: No deformities, no cyanosis or clubbing  Neuro: alert, awake, non focal  Skin: Warm, no lesions or rash      Assessment & Plan:  COPD 0/ still  smoking   Try starting Spiriva 2 puffs once daily.  Keep track of whether you get benefit from this medication, whether it helps your breathing. Keep albuterol available use 2 puffs if you need it for shortness of breath, chest tightness, wheezing. We will likely repeat pulmonary function testing at some point in the near future.  We can talk about the scheduling next time. Follow with Dr Lamonte Sakai in 1 month or next available.  Pulmonary nodule Enlarging right upper lobe pulmonary nodule, atypical cells on recent BAL.  Suspect that this is slow-growing non-small cell lung cancer.  We will discuss his case in thoracic conference, ultimately would likely refer him for radiation, SBRT.  Given his functional capacity I do not believe he would be a good candidate for primary resection.  Baltazar Apo, MD, PhD 08/06/2020, 9:23 AM [facility] [phone] or if no answer [phone]

## 2020-08-06 NOTE — Assessment & Plan Note (Signed)
  Try starting Spiriva 2 puffs once daily.  Keep track of whether you get benefit from this medication, whether it helps your breathing. Keep albuterol available use 2 puffs if you need it for shortness of breath, chest tightness, wheezing. We will likely repeat pulmonary function testing at some point in the near future.  We can talk about the scheduling next time. Follow with Dr Lamonte Sakai in 1 month or next available.

## 2020-08-06 NOTE — Addendum Note (Signed)
Addended by: Gavin Potters R on: 08/06/2020 09:46 AM   Modules accepted: Orders

## 2020-08-06 NOTE — Patient Instructions (Addendum)
Dr. Lamonte Sakai will discuss your bronchoscopy results at the thoracic meeting.  Suspect that based on this we will refer you to be seen by radiation oncology to discuss radiation treatment to your right upper lobe pulmonary nodule. Try starting Spiriva 2 puffs once daily.  Keep track of whether you get benefit from this medication, whether it helps your breathing. Keep albuterol available use 2 puffs if you need it for shortness of breath, chest tightness, wheezing. We will likely repeat pulmonary function testing at some point in the near future.  We can talk about the scheduling next time. Follow with Dr Lamonte Sakai in 1 month or next available.

## 2020-08-06 NOTE — Assessment & Plan Note (Signed)
Enlarging right upper lobe pulmonary nodule, atypical cells on recent BAL.  Suspect that this is slow-growing non-small cell lung cancer.  We will discuss his case in thoracic conference, ultimately would likely refer him for radiation, SBRT.  Given his functional capacity I do not believe he would be a good candidate for primary resection.

## 2020-08-08 ENCOUNTER — Encounter: Payer: Self-pay | Admitting: Radiation Oncology

## 2020-08-08 NOTE — Progress Notes (Signed)
The patient's case was discussed in multidisciplinary thoracic conference today.  He has been found to have an enlarging right lung nodule on CT imaging.  PET scan revealed a hypermetabolic tumor measuring 0.8 cm with a maximum SUV of 4.8.  Pathology was reviewed in detail today.  The lavage showed some atypical cells.  In terms of the imaging, this was highly suspicious for malignancy.  Putting all of this together within the overall clinical context, I believe that there is a very high suspicion for malignancy and it would be reasonable to consider stereotactic body radiation treatment at this time.  Pulmonary feels that this likely would be a good option for him rather than surgery given his overall status, and is going to further discuss with him this possible option.  We would be very happy to see the patient in the near future to further discuss and consider stereotactic body radiation treatment to this lesion.  ------------------------------------------------  Jodelle Gross, MD, PhD

## 2020-08-09 NOTE — Telephone Encounter (Signed)
Application was signed by RB and given to pt and wife in [place] on 08/06/20. Nothing further needed at this times

## 2020-08-09 NOTE — Telephone Encounter (Signed)
Attempted to follow up on this form but it was not in RB's look-at folder, and his sign folder is empty. Estill Bamberg please advise if you have given RB this form or have any updates.  Thanks!

## 2020-08-12 ENCOUNTER — Encounter: Payer: Self-pay | Admitting: *Deleted

## 2020-08-12 DIAGNOSIS — R911 Solitary pulmonary nodule: Secondary | ICD-10-CM

## 2020-08-12 NOTE — Progress Notes (Signed)
Dr. Lamonte Sakai update me that Mr. Rasmusson need referral to rad onc.  Will complete

## 2020-08-19 DIAGNOSIS — Z23 Encounter for immunization: Secondary | ICD-10-CM | POA: Diagnosis not present

## 2020-08-21 ENCOUNTER — Encounter: Payer: Self-pay | Admitting: Radiation Oncology

## 2020-08-21 ENCOUNTER — Other Ambulatory Visit: Payer: Self-pay

## 2020-08-21 ENCOUNTER — Ambulatory Visit
Admission: RE | Admit: 2020-08-21 | Discharge: 2020-08-21 | Disposition: A | Discharge status: Home / Self Care | Payer: Medicare Other | Source: Ambulatory Visit | Attending: Radiation Oncology | Admitting: Radiation Oncology

## 2020-08-21 VITALS — BP 120/88 | HR 81 | Temp 98.2°F | Resp 24 | Ht 64.0 in | Wt 254.2 lb

## 2020-08-21 DIAGNOSIS — J449 Chronic obstructive pulmonary disease, unspecified: Secondary | ICD-10-CM | POA: Diagnosis not present

## 2020-08-21 DIAGNOSIS — G4730 Sleep apnea, unspecified: Secondary | ICD-10-CM | POA: Diagnosis not present

## 2020-08-21 DIAGNOSIS — I10 Essential (primary) hypertension: Secondary | ICD-10-CM | POA: Insufficient documentation

## 2020-08-21 DIAGNOSIS — R911 Solitary pulmonary nodule: Secondary | ICD-10-CM | POA: Diagnosis not present

## 2020-08-21 DIAGNOSIS — K219 Gastro-esophageal reflux disease without esophagitis: Secondary | ICD-10-CM | POA: Insufficient documentation

## 2020-08-21 DIAGNOSIS — F17210 Nicotine dependence, cigarettes, uncomplicated: Secondary | ICD-10-CM | POA: Diagnosis not present

## 2020-08-21 DIAGNOSIS — M129 Arthropathy, unspecified: Secondary | ICD-10-CM | POA: Insufficient documentation

## 2020-08-21 DIAGNOSIS — J8410 Pulmonary fibrosis, unspecified: Secondary | ICD-10-CM | POA: Diagnosis not present

## 2020-08-21 DIAGNOSIS — Z79899 Other long term (current) drug therapy: Secondary | ICD-10-CM | POA: Diagnosis not present

## 2020-08-21 DIAGNOSIS — I2510 Atherosclerotic heart disease of native coronary artery without angina pectoris: Secondary | ICD-10-CM | POA: Insufficient documentation

## 2020-08-21 DIAGNOSIS — K449 Diaphragmatic hernia without obstruction or gangrene: Secondary | ICD-10-CM | POA: Diagnosis not present

## 2020-08-21 DIAGNOSIS — C3411 Malignant neoplasm of upper lobe, right bronchus or lung: Secondary | ICD-10-CM

## 2020-08-21 DIAGNOSIS — Z7901 Long term (current) use of anticoagulants: Secondary | ICD-10-CM | POA: Insufficient documentation

## 2020-08-21 DIAGNOSIS — I4891 Unspecified atrial fibrillation: Secondary | ICD-10-CM | POA: Diagnosis not present

## 2020-08-21 DIAGNOSIS — E785 Hyperlipidemia, unspecified: Secondary | ICD-10-CM | POA: Diagnosis not present

## 2020-08-21 NOTE — Progress Notes (Signed)
Thoracic Location of Tumor / Histology:  RIGHT upper lobe pulmonary nodule, mediastinal lymphadenopathy (suspicious for slow-growing non-small cell lung cancer)  Patient presented with symptoms of: patient was found to have a small right upper lobe pulmonary nodule and mediastinal lymphadenopathy on a follow-up CT scan on 03/14/2020 to do surveillance of his interstitial lung disease; the PET scan on 03/26/2020 showed that the nodule was hypermetabolic.  Biopsies revealed:  07/30/2020 FINAL MICROSCOPIC DIAGNOSIS:  A. LUNG, RUL, BRUSHING:  - No malignant cells identified  B. LUNG, RUL, FINE NEEDLE ASPIRATION:  - Non-diagnostic  C. LUNG, RUL, LAVAGE: - Atypical cells present  - See comment DIAGNOSTIC COMMENTS:  There are rare atypical cells present. There is no accompanying cell block for further possible workup  Tobacco/Marijuana/Snuff/ETOH use:  Current every day smoker (has smoked ~1/2 pack per day for over 50 years). Does not use smokeless tobacco, drink alcohol, or use illicit drugs  Past/Anticipated interventions by cardiothoracic surgery, if any:  08/06/2020 Dr. Baltazar Apo --Enlarging right upper lobe pulmonary nodule, atypical cells on recent BAL.   --Suspect that this is slow-growing non-small cell lung cancer.  We will discuss his case in thoracic conference, ultimately would likely refer him for radiation, SBRT.   Given his functional capacity I do not believe he would be a good candidate for primary resection.  07/30/2020 Dr. Baltazar Apo Flexible video fiberoptic bronchoscopy with electromagnetic navigation and biopsies  Past/Anticipated interventions by medical oncology, if any:  No referral placed at this time   Signs/Symptoms  Weight changes, if any: None  Respiratory complaints, if any: Patient states the get gets sob with activity.  Hemoptysis, if any: None  Pain issues, if any:  None  SAFETY ISSUES:  Prior radiation? None  Pacemaker/ICD? Patient  states the has a  stent  Possible current pregnancy? N/A Is the patient on methotrexate?None Current Complaints / other details:   Vitals:   08/21/20 1023  BP: 120/88  Pulse: 81  Resp: (!) 24  Temp: 98.2 F (36.8 C)  SpO2: 97%  Weight: 115.3 kg  Height: _0  (1.626 m)

## 2020-08-21 NOTE — Progress Notes (Signed)
Radiation Oncology         [phone] ________________________________  Initial Outpatient Consultation  Name: Daniel WEISENSEL MRN: [id number]  Date: 08/21/2020  DOB: 1956-03-04  CC:Daniel Stains, MD  Daniel Gobble, MD   REFERRING PHYSICIAN: Collene Gobble, MD  DIAGNOSIS: The primary encounter diagnosis was Malignant neoplasm of right upper lobe of lung ([place]). A diagnosis of Pulmonary nodule was also pertinent to this visit.  Right upper lobe pulmonary nodule, presumed non-small cell carcinoma  HISTORY OF PRESENT ILLNESS::Daniel Ball is a 64 y.o. male who is seen as a courtesy of Dr. Lamonte Sakai for an opinion concerning radiation therapy as part of management for his recently diagnosed lung cancer. Today, he is accompanied by his wife on evaluation. The patient has been followed by pulmonology for COPD and non-specific interstitial lung disease since 2002. The patient underwent serial chest CT scans that remained relatively stable until 03/13/2019, during which time there was noted to be a new irregular subpleural 8 mm nodule in the right upper lobe that was moderately suspicious for developing bronchogenic carcinoma. Small mediastinal lymph nodes were stable. Chronic lung disease consistent with a combination of emphysema and non-specific interstitial pneumonitis was also stable. Repeat chest CT scan on 06/05/2019 showed slight regression of the nodule of concern in the periphery of the right upper lobe, suggesting benign etiology. The other nodule in the inferior aspect of the right upper lobe that abutted the minor fissure was stable.   Follow-up chest CT scan on 03/14/2020 showed an irregular 9 mm solid peripheral apical right upper lobe pulmonary nodule that had increased in size and was worrisome for primary bronchogenic carcinoma. Spectrum findings were compatible with fibrotic interstitial lung disease without frank honeycombing and without a clear apicobasilar gradient or  appreciable interval progression.  PET scan on 03/25/2020 showed a 0.8 cm right apical nodule that was hypermetabolic with a maximum SUV of 4.8 and highly suspicious for malignancy. Right paratracheal lymph nodes were present and included a mildly enlarged lower paratracheal node, but had activity similar to blood pool and thus not suspiciously elevated.   The patient underwent a video bronchoscopy with electromagnetic navigation and endobronchial ultrasound on 04/09/2020 under the care of Dr. Lamonte Sakai. Pathology from the procedure revealed benign tissue of the right upper lobe. Cytology from the procedure did not reveal any malignant cells in the right upper lobe brushing or fine needle aspiration. There were no malignant cells identified in two lymph node (7 and 11R) fine needle aspirations.  Follow-up super chest CT scan on 07/01/2020 showed a slight increase in size of the spiculated pulmonary nodule of the peripheral right pulmonary apex that measured 1.0 x 1.0 cm, previously 0.8 x 0.8 cm. There were marking clips located 0.7 cm and 1.8 cm inferiorly to the nodule. Given the morphology, interval enlargement, and previous FDG avidity, it was presumed adenocarcinoma. Mild pattern of pulmonary fibrosis featuring irregular peripheral interstitial opacity and scattered ground-glass was unchanged; no significant bronchiolectasis and no clear apical to basal gradient, consistent with an "indeterminate UIP".  Repeat bronchoscopy on 07/30/2020 revealed the presence of atypical cells in the right upper lobe suspicious but not diagnostic for malignancy..   The patient was last seen by Dr. Lamonte Sakai on 08/06/2020. He is suspected to have a slow-growing non-small cell lung cancer. Given his functional capacity, he was not considered a good candidate for primary resection. SBRT was recommended.  Patient's case was presented at the multidisciplinary thoracic conference recently.  Radiation oncology  and thoracic surgery  as well as pulmonary medicine was present.  Recommendations from this conference were SBRT given the patient's significant medical issues.  PREVIOUS RADIATION THERAPY: No  PAST MEDICAL HISTORY:  Past Medical History:  Diagnosis Date  . Arthritis   . Atrial fibrillation ([place])   . COPD (chronic obstructive pulmonary disease) ([place])   . Coronary artery disease    a. 12/2019: s/p orbital atherectomy and DES placement to proximal/mid LAD.   [name] GERD (gastroesophageal reflux disease)   . Hyperlipidemia   . Hypertension    hx HBP - MEDS DC'D 10 YRS since BP has been in normal range  . Impingement syndrome of shoulder    left  . Pre-diabetes   . Shortness of breath    with exertion  . Sleep apnea    couldnt tolerate CPAP  . Spinal stenosis     PAST SURGICAL HISTORY: Past Surgical History:  Procedure Laterality Date  . BRONCHIAL BIOPSY  04/09/2020   Procedure: BRONCHIAL BIOPSIES;  Surgeon: Daniel Gobble, MD;  Location: [facility] ENDOSCOPY;  Service: Pulmonary;;  . BRONCHIAL BRUSHINGS  04/09/2020   Procedure: BRONCHIAL BRUSHINGS;  Surgeon: Daniel Gobble, MD;  Location: [facility] ENDOSCOPY;  Service: Pulmonary;;  . BRONCHIAL BRUSHINGS  07/30/2020   Procedure: BRONCHIAL BRUSHINGS;  Surgeon: Daniel Gobble, MD;  Location: [facility] ENDOSCOPY;  Service: Pulmonary;;  . BRONCHIAL NEEDLE ASPIRATION BIOPSY  04/09/2020   Procedure: BRONCHIAL NEEDLE ASPIRATION BIOPSIES;  Surgeon: Daniel Gobble, MD;  Location: [facility] ENDOSCOPY;  Service: Pulmonary;;  . BRONCHIAL NEEDLE ASPIRATION BIOPSY  07/30/2020   Procedure: BRONCHIAL NEEDLE ASPIRATION BIOPSIES;  Surgeon: Daniel Gobble, MD;  Location: [facility] ENDOSCOPY;  Service: Pulmonary;;  . BRONCHIAL WASHINGS  04/09/2020   Procedure: BRONCHIAL WASHINGS;  Surgeon: Daniel Gobble, MD;  Location: [facility] ENDOSCOPY;  Service: Pulmonary;;  . BRONCHIAL WASHINGS  07/30/2020   Procedure: BRONCHIAL WASHINGS;  Surgeon: Daniel Gobble, MD;  Location: [facility] ENDOSCOPY;  Service: Pulmonary;;  . CORONARY  ATHERECTOMY N/A 01/18/2020   Procedure: CORONARY ATHERECTOMY;  Surgeon: Nelva Bush, MD;  Location: [place] CV LAB;  Service: Cardiovascular;  Laterality: N/A;  . CORONARY STENT INTERVENTION  01/18/2020  . CORONARY STENT INTERVENTION N/A 01/18/2020   Procedure: CORONARY STENT INTERVENTION;  Surgeon: Nelva Bush, MD;  Location: [place] CV LAB;  Service: Cardiovascular;  Laterality: N/A;  . EYE SURGERY Left   . FIDUCIAL MARKER PLACEMENT  04/09/2020   Procedure: FIDUCIAL MARKER PLACEMENT;  Surgeon: Daniel Gobble, MD;  Location: [facility] ENDOSCOPY;  Service: Pulmonary;;  . FINE NEEDLE ASPIRATION  04/09/2020   Procedure: FINE NEEDLE ASPIRATION (FNA) LINEAR;  Surgeon: Daniel Gobble, MD;  Location: [place] ENDOSCOPY;  Service: Pulmonary;;  . HAND SURGERY Right    thumb  . INTRAVASCULAR PRESSURE WIRE/FFR STUDY N/A 11/02/2019   Procedure: INTRAVASCULAR PRESSURE WIRE/FFR STUDY;  Surgeon: Nelva Bush, MD;  Location: [place] CV LAB;  Service: Cardiovascular;  Laterality: N/A;  . INTRAVASCULAR ULTRASOUND/IVUS N/A 01/18/2020   Procedure: Intravascular Ultrasound/IVUS;  Surgeon: Nelva Bush, MD;  Location: [place] CV LAB;  Service: Cardiovascular;  Laterality: N/A;  . LUMBAR LAMINECTOMY/DECOMPRESSION MICRODISCECTOMY N/A 02/07/2014   Procedure: LUMBAR DECOMPRESSION Lumbar one-Lumbar five;  Surgeon: [name], MD;  Location: [facility] ORS;  Service: Orthopedics;  Laterality: N/A;  . RIGHT/LEFT HEART CATH AND CORONARY ANGIOGRAPHY N/A 11/02/2019   Procedure: RIGHT/LEFT HEART CATH AND CORONARY ANGIOGRAPHY;  Surgeon: Nelva Bush, MD;  Location: [place] CV LAB;  Service: Cardiovascular;  Laterality: N/A;  .  SHOULDER ARTHROSCOPY WITH SUBACROMIAL DECOMPRESSION Left 04/12/2014   Procedure: LEFT SHOULDER ARTHROSCOPY WITH SUBACROMIAL DECOMPRESSION AND LABRAL DEBRIDEMENT, ROTATOR CUFF DEBRIDEMENT, BICEPS DEBRIDEMENT;  Surgeon: [name], MD;  Location: [facility] ORS;  Service: Orthopedics;   Laterality: Left;  [name] VIDEO BRONCHOSCOPY WITH ENDOBRONCHIAL NAVIGATION N/A 04/09/2020   Procedure: VIDEO BRONCHOSCOPY WITH ENDOBRONCHIAL NAVIGATION;  Surgeon: Daniel Gobble, MD;  Location: [place] ENDOSCOPY;  Service: Pulmonary;  Laterality: N/A;  . VIDEO BRONCHOSCOPY WITH ENDOBRONCHIAL NAVIGATION Right 07/30/2020   Procedure: VIDEO BRONCHOSCOPY WITH ENDOBRONCHIAL NAVIGATION;  Surgeon: Daniel Gobble, MD;  Location: [place] ENDOSCOPY;  Service: Pulmonary;  Laterality: Right;  [name] VIDEO BRONCHOSCOPY WITH ENDOBRONCHIAL ULTRASOUND N/A 04/09/2020   Procedure: VIDEO BRONCHOSCOPY WITH ENDOBRONCHIAL ULTRASOUND;  Surgeon: Daniel Gobble, MD;  Location: [facility] ENDOSCOPY;  Service: Pulmonary;  Laterality: N/A;  . VIDEO BRONCHOSCOPY WITH RADIAL ENDOBRONCHIAL ULTRASOUND  04/09/2020   Procedure: VIDEO BRONCHOSCOPY WITH RADIAL ENDOBRONCHIAL ULTRASOUND;  Surgeon: Daniel Gobble, MD;  Location: [facility] ENDOSCOPY;  Service: Pulmonary;;    FAMILY HISTORY:  Family History  Family history unknown: Yes    SOCIAL HISTORY:  Social History   Tobacco Use  . Smoking status: Current Every Day Smoker    Packs/day: 0.50    Years: 54.00    Pack years: 27.00    Types: Cigarettes  . Smokeless tobacco: Never Used  . Tobacco comment: 10 cigarettes a day 08/06/20  ARJ  Vaping Use  . Vaping Use: Never used  Substance Use Topics  . Alcohol use: No  . Drug use: No    ALLERGIES: No Known Allergies  MEDICATIONS:  Current Outpatient Medications  Medication Sig Dispense Refill  . albuterol (VENTOLIN HFA) 108 (90 Base) MCG/ACT inhaler Inhale 2 puffs into the lungs every 6 (six) hours as needed for wheezing or shortness of breath.    [name] amLODipine (NORVASC) 5 MG tablet Take 1 tablet (5 mg total) by mouth 2 (two) times daily. 180 tablet 3  . apixaban (ELIQUIS) 5 MG TABS tablet Take 1 tablet (5 mg total) by mouth 2 (two) times daily. Okay to restart this medication on 07/31/2020. 60 tablet 5  . buPROPion (WELLBUTRIN XL) 300 MG 24 hr tablet  Take 300 mg by mouth daily.    . clopidogrel (PLAVIX) 75 MG tablet Take 1 tablet (75 mg total) by mouth daily with breakfast. Okay to restart this medication on 07/31/2020.    . furosemide (LASIX) 40 MG tablet Take 1 tablet (40 mg total) by mouth daily as needed.    . isosorbide mononitrate (IMDUR) 60 MG 24 hr tablet Take 60 mg by mouth daily.    . metoprolol succinate (TOPROL XL) 25 MG 24 hr tablet Take 1 tablet (25 mg total) by mouth daily. 90 tablet 1  . nitroGLYCERIN (NITROSTAT) 0.4 MG SL tablet Place 1 tablet (0.4 mg total) under the tongue every 5 (five) minutes x 3 doses as needed for chest pain.    . pantoprazole (PROTONIX) 40 MG tablet Take 1 tablet (40 mg total) by mouth daily. 60 tablet 2  . potassium chloride SA (KLOR-CON) 20 MEQ tablet Take 1 tablet (20 mEq total) by mouth daily as needed (take on the days you take Lasix.). 30 tablet 5  . rosuvastatin (CRESTOR) 40 MG tablet Take 1 tablet (40 mg total) by mouth daily. 90 tablet 3  . valsartan (DIOVAN) 320 MG tablet Take 320 mg by mouth daily.    . Tiotropium Bromide Monohydrate (SPIRIVA RESPIMAT) 2.5 MCG/ACT AERS Inhale 2 puffs  into the lungs daily. (Patient not taking: Reported on 08/21/2020) 4 g 0   No current facility-administered medications for this encounter.    REVIEW OF SYSTEMS:  A 10+ POINT REVIEW OF SYSTEMS WAS OBTAINED including neurology, dermatology, psychiatry, cardiac, respiratory, lymph, extremities, GI, GU, musculoskeletal, constitutional, reproductive, HEENT.  Complains of dyspnea with exertion.  He can only walk several feet before feeling short of breath.  He denies any pain in the chest area significant cough or hemoptysis.  Patient denies any new bony pain headaches dizziness or blurred vision.   PHYSICAL EXAM:  height is 5\' 4"  (1.626 m) and weight is 254 lb 3.2 oz (115.3 kg). His temperature is 98.2 F (36.8 C). His blood pressure is 120/88 and his pulse is 81. His respiration is 24 (abnormal) and oxygen saturation  is 97%.   General: Alert and oriented, in no acute distress HEENT: Head is normocephalic. Extraocular movements are intact.  Neck: Neck is supple, no palpable cervical or supraclavicular lymphadenopathy. Heart: Regular in rate and rhythm with no murmurs, rubs, or gallops. Chest: Clear to auscultation bilaterally, with no rhonchi, wheezes, or rales. Abdomen: Soft, nontender, nondistended, with no rigidity or guarding. Extremities: No cyanosis or edema. Lymphatics: see Neck Exam Skin: No concerning lesions. Musculoskeletal: symmetric strength and muscle tone throughout. Neurologic: Cranial nerves II through XII are grossly intact. No obvious focalities. Speech is fluent. Coordination is intact. Psychiatric: Judgment and insight are intact. Affect is appropriate.   ECOG = 1  0 - Asymptomatic (Fully active, able to carry on all predisease activities without restriction)  1 - Symptomatic but completely ambulatory (Restricted in physically strenuous activity but ambulatory and able to carry out work of a light or sedentary nature. For example, light housework, office work)  2 - Symptomatic, <50% in bed during the day (Ambulatory and capable of all self care but unable to carry out any work activities. Up and about more than 50% of waking hours)  3 - Symptomatic, >50% in bed, but not bedbound (Capable of only limited self-care, confined to bed or chair 50% or more of waking hours)  4 - Bedbound (Completely disabled. Cannot carry on any self-care. Totally confined to bed or chair)  5 - Death   Eustace Pen MM, Creech RH, Tormey DC, et al. [phone]). "Toxicity and response criteria of the [employer] [facility]". [place] Oncol. 5 (6): 649-55  LABORATORY DATA:  Lab Results  Component Value Date   WBC 7.6 07/30/2020   HGB 14.3 07/30/2020   HCT 43.6 07/30/2020   MCV 94.4 07/30/2020   PLT 115 (L) 07/30/2020   NEUTROABS 4.6 01/16/2020   Lab Results  Component Value Date   NA 139  07/30/2020   K 4.2 07/30/2020   CL 104 07/30/2020   CO2 25 07/30/2020   GLUCOSE 141 (H) 07/30/2020   CREATININE 1.07 07/30/2020   CALCIUM 9.2 07/30/2020      RADIOGRAPHY: DG Chest Port 1 View  Result Date: 07/30/2020 CLINICAL DATA:  Post bronchoscopy with biopsy EXAM: PORTABLE CHEST 1 VIEW COMPARISON:  Portable exam 1210 hours compared to CT chest of 07/01/2020 FINDINGS: Upper normal heart size. Large hiatal hernia. Mediastinal contours and pulmonary vascularity normal. Chronic interstitial changes consistent with pulmonary fibrosis 3 surgical clips/markers are identified in RIGHT upper lobe at region of previously identified pulmonary nodule, nodule not discretely identified. No pleural effusion or pneumothorax. Bones demineralized. IMPRESSION: Pulmonary fibrosis. Previously seen RIGHT upper lobe pulmonary nodule is not clearly delineated. Electronically Signed  By: Lavonia Dana M.D.   On: 07/30/2020 12:40   DG C-ARM BRONCHOSCOPY  Result Date: 07/30/2020 C-ARM BRONCHOSCOPY: Fluoroscopy was utilized by the requesting physician.  No radiographic interpretation.      IMPRESSION: Right upper lobe pulmonary nodule, presumed non-small cell carcinoma  I discussed with the patient and his wife that there is a very high suspicion for slow-growing malignancy in this right upper lobe pulmonary nodule.  We discussed that the PET scan shows activity in this region and bronchoscopic procedure showed atypical cells on most recent evaluation.  Given this constellation of findings I would agree with Dr. Lamonte Sakai that the patient likely has a slow-growing malignancy, likely adenocarcinoma.  As above he is not a good candidate for surgery given his pulmonary status and medical issues.  He would be a good candidate for stereotactic body radiation therapy directed at this solitary nodule in the right upper lobe.  Today, I talked to the patient and wife about the findings and work-up thus far.  We discussed the  natural history of lung cancer and general treatment, highlighting the role of radiotherapy in the management.  We discussed the available radiation techniques, and focused on the details of logistics and delivery.  We reviewed the anticipated acute and late sequelae associated with radiation in this setting.  The patient was encouraged to ask questions that I answered to the best of my ability.  A patient consent form was discussed and signed.  We retained a copy for our records.  The patient would like to proceed with radiation and will be scheduled for CT simulation.  PLAN: Patient will return for CT simulation on December 13 with treatments to begin December 22.  The patient will be out of town for several days prior to this initiation of treatment.  Anticipate 3 SBRT treatments directed at the solitary nodule in the right upper lobe.  Total time spent in this encounter was 60 minutes which included reviewing the patient's most recent chest CT scans, PET scan, bronchoscopies, pathology/cytology reports, follow-ups, physical examination, and documentation.  ------------------------------------------------  Blair Promise, PhD, MD  This document serves as a record of services personally performed by Gery Pray, MD. It was created on his behalf by Clerance Lav, a trained medical scribe. The creation of this record is based on the scribe's personal observations and the provider's statements to them. This document has been checked and approved by the attending provider.

## 2020-08-22 ENCOUNTER — Other Ambulatory Visit: Payer: Self-pay | Admitting: *Deleted

## 2020-08-22 ENCOUNTER — Encounter: Payer: Self-pay | Admitting: *Deleted

## 2020-08-22 NOTE — Progress Notes (Signed)
The proposed treatment discussed in cancer conference 12/2 is for discussion purpose only and is not a binding recommendation.  The patient was not physically examined nor present for their treatment options.  Therefore, final treatment plans cannot be decided.

## 2020-09-02 ENCOUNTER — Other Ambulatory Visit: Payer: Self-pay

## 2020-09-02 ENCOUNTER — Ambulatory Visit
Admission: RE | Admit: 2020-09-02 | Discharge: 2020-09-02 | Disposition: A | Discharge status: Home / Self Care | Payer: Medicare Other | Source: Ambulatory Visit | Attending: Radiation Oncology | Admitting: Radiation Oncology

## 2020-09-02 ENCOUNTER — Telehealth: Payer: Self-pay | Admitting: Radiation Oncology

## 2020-09-02 DIAGNOSIS — F17210 Nicotine dependence, cigarettes, uncomplicated: Secondary | ICD-10-CM | POA: Diagnosis not present

## 2020-09-02 DIAGNOSIS — Z510 Encounter for antineoplastic radiation therapy: Secondary | ICD-10-CM | POA: Diagnosis not present

## 2020-09-02 DIAGNOSIS — R911 Solitary pulmonary nodule: Secondary | ICD-10-CM | POA: Diagnosis not present

## 2020-09-02 DIAGNOSIS — C3411 Malignant neoplasm of upper lobe, right bronchus or lung: Secondary | ICD-10-CM | POA: Diagnosis not present

## 2020-09-02 NOTE — Telephone Encounter (Signed)
Changed, per patient request, his name in Epic to [employer].I will let Epic team know to change his MyChart.

## 2020-09-05 ENCOUNTER — Other Ambulatory Visit: Payer: Self-pay

## 2020-09-05 ENCOUNTER — Ambulatory Visit (INDEPENDENT_AMBULATORY_CARE_PROVIDER_SITE_OTHER): Payer: Medicare Other | Admitting: Emergency Medicine

## 2020-09-05 ENCOUNTER — Encounter: Payer: Self-pay | Admitting: Emergency Medicine

## 2020-09-05 DIAGNOSIS — I255 Ischemic cardiomyopathy: Secondary | ICD-10-CM | POA: Diagnosis not present

## 2020-09-05 DIAGNOSIS — J449 Chronic obstructive pulmonary disease, unspecified: Secondary | ICD-10-CM | POA: Diagnosis not present

## 2020-09-05 DIAGNOSIS — C3490 Malignant neoplasm of unspecified part of unspecified bronchus or lung: Secondary | ICD-10-CM | POA: Diagnosis not present

## 2020-09-05 DIAGNOSIS — J8489 Other specified interstitial pulmonary diseases: Secondary | ICD-10-CM

## 2020-09-05 NOTE — Assessment & Plan Note (Signed)
Likely due to his tobacco use.  Will continue CT surveillance.  Discussed smoking cessation

## 2020-09-05 NOTE — Progress Notes (Signed)
Subjective:    Patient ID: Daniel Ball, male    DOB: December 08, 1955, 64 y.o.   MRN: [id number]  HPI 64 year old active Ball (Daniel pack yrs) with CAD status post stent placement to the LAD 4/21, GERD, hypertension, left shoulder impingement syndrome, OSA not on CPAP. He has been followed here for COPD, also noted to have ILD on CT chest in an NSIP pattern.   He reports that he has experience slow progressive exertional SOB that occurs mainly with lifting and moving his arms or lifting - even w washing his hair. He becomes SOB with an incline. He is able to walk indefinitely on flat ground. He has cough in the am - thick and white. He has wheeze much of the day. Sounds like some possible UA noise - wife can hear it.  He was tried on Stiolto to see if he would get benefit >> didn't really notice any benefit with exertion so he stopped. He has uses albuterol - may have benefited some.   He is planning to have a repeat PSG soon - is willing to retry CPAP.  He is also about to start cardiopulm rehab. Follows with Dr Harrington Challenger.   High-resolution CT scan of the chest from 09/08/2018 reviewed by me showed an NSIP pattern with associated mild centrilobular paraseptal emphysema and bronchial wall thickening.  Little change from 2017  Reviewed CT scans of the chest 03/13/2019 and 06/06/2019.  The initial scan showed an irregular 8 mm right upper lobe nodule superimposed on his chronic interstitial disease.  The repeat scan in September showed decrease in size 6 x 6 mm consistent with a benign cause.  Pulmonary function testing reviewed by me from 08/22/2018 show grossly normal airflows with evidence for mild obstruction based on curve his flow volume loop.  Normal lung volumes and a decreased diffusion capacity that corrects to the normal range when adjusted for alveolar volume.  ROV 07/03/20 --follow-up visit for Daniel Ball with tobacco abuse (85 pack years), CAD, hypertension, COPD and ILD (NSIP  pattern).  We performed a CT chest to follow his ILD which showed possible increase in size in his right upper lobe pulmonary nodule.  This prompted a PET scan which was done on 03/26/2020.  There was moderate hypermetabolism,  suspicious for malignancy.  He underwent navigational bronchoscopy on 04/09/2020.  The cytology and pathology was negative.  We agreed to repeat his CT which was done on 07/01/2020 and I have reviewed, shows further slight increase in size in the spiculated right upper lobe pulmonary nodule, now 1.0 x 1.0 cm.  There is mild paraseptal emphysema unchanged mild pattern of pulmonary fibrosis and NSIP.  He has grossly normal airflows by PFT December 2019, evidence for mild obstruction, none since.  ROV 08/06/20 -Daniel Ball is Daniel Ball, Daniel Ball with COPD and NSIP/ILD.  He has a slowly enlarging right upper lobe peripheral pulmonary nodule that is mildly hypermetabolic on PET scan, 1.0 x 1.0 cm on most recent imaging.  He underwent a repeat bronchoscopy on 07/30/2020.  Brushings were negative, cell block negative but washings showed atypical cells.  Most recent PFT from 08/22/2018 show an FEV1 of 2.22 L or 80% predicted, mixed obstruction and restriction.  ROV 09/05/20 --Daniel Ball, history of tobacco with NSIP/ILD, COPD, OSA.  We have been evaluating an enlarging right upper lobe peripheral nodule, positive on PET scan.  Initial brushings were negative but subsequent sampling 07/30/2020 showed atypical cells.  All consistent with non-small cell lung cancer.  He is planning for stereotactic radiation with Dr. Sondra Come, planning to start.  We tried starting Spiriva at his last visit to see if he would get benefit - no real change. He does use ventolin prn, often in the am to help clear some mucous. He is on BiPAP and is getting good benefit from it. Good compliance.   MDM: Reviewed radiation oncology notes 08/21/2020, 09/02/2020   Review of Systems As per HPI     Objective:    Physical Exam Vitals:   09/05/20 1010  BP: 118/74  Pulse: 68  Temp: (!) 97.2 F (36.2 C)  SpO2: 97%  Weight: 253 lb 9.6 oz (115 kg)  Height: 5\' 4"  (1.626 m)    Gen: Pleasant, obese, in no distress,  normal affect  ENT: No lesions,  mouth clear,  oropharynx clear, no postnasal drip  Neck: No JVD, no stridor  Lungs: No use of accessory muscles, no crackles or wheezing on normal respiration, no wheeze on forced expiration  Cardiovascular: RRR, heart sounds normal, no murmur or gallops, no peripheral edema  Musculoskeletal: No deformities, no cyanosis or clubbing  Neuro: alert, awake, non focal  Skin: Warm, no lesions or rash      Assessment & Plan:  No problem-specific Assessment & Plan notes found for this encounter.  Baltazar Apo, MD, PhD 09/05/2020, 10:27 AM [facility] [phone] or if no answer [phone]

## 2020-09-05 NOTE — Assessment & Plan Note (Signed)
Atypical cells on his second bronchoscopy.  Planning for stereotactic radiation this month.  We will coordinate with radiation oncology to plan his repeat CT scans of the chest.

## 2020-09-05 NOTE — Patient Instructions (Addendum)
Follow with Radiation Oncology as planned.  We will repeat your CT chest when recommend to follow improvement in your pulmonary nodule.  Stop Spiriva for now.  We may reassess going forward starting an everyday inhaled medicine depending on how your breathing is doing Keep your albuterol available to use 2 puffs when you need if shortness of breath, chest tightness, wheezing. We will probably repeat your pulmonary function testing at some point in the future after you have finished your radiation treatments COVID-19 vaccine up-to-date Flu and pneumonia vaccines up-to-date Follow with Dr Lamonte Sakai in 6 months or sooner if you have any problems

## 2020-09-05 NOTE — Assessment & Plan Note (Signed)
Did not get any significant benefit from Spiriva.  We will stop it for now.  Keep his albuterol available.  We may decide to retry maintenance bronchodilator therapy in the future (he did not benefit from [facility] in 2017).  I will probably repeat his pulmonary function testing at some point after his radiation is done.  Needs to work hard on smoking cessation.  We discussed this.

## 2020-09-06 DIAGNOSIS — F17210 Nicotine dependence, cigarettes, uncomplicated: Secondary | ICD-10-CM | POA: Diagnosis not present

## 2020-09-06 DIAGNOSIS — Z510 Encounter for antineoplastic radiation therapy: Secondary | ICD-10-CM | POA: Diagnosis not present

## 2020-09-06 DIAGNOSIS — C3411 Malignant neoplasm of upper lobe, right bronchus or lung: Secondary | ICD-10-CM | POA: Diagnosis not present

## 2020-09-06 DIAGNOSIS — R911 Solitary pulmonary nodule: Secondary | ICD-10-CM | POA: Diagnosis not present

## 2020-09-11 ENCOUNTER — Ambulatory Visit
Admission: RE | Admit: 2020-09-11 | Discharge: 2020-09-11 | Disposition: A | Discharge status: Home / Self Care | Payer: Medicare Other | Source: Ambulatory Visit | Attending: Radiation Oncology | Admitting: Radiation Oncology

## 2020-09-11 DIAGNOSIS — F17210 Nicotine dependence, cigarettes, uncomplicated: Secondary | ICD-10-CM | POA: Diagnosis not present

## 2020-09-11 DIAGNOSIS — C3411 Malignant neoplasm of upper lobe, right bronchus or lung: Secondary | ICD-10-CM | POA: Diagnosis not present

## 2020-09-11 DIAGNOSIS — R911 Solitary pulmonary nodule: Secondary | ICD-10-CM | POA: Diagnosis not present

## 2020-09-11 DIAGNOSIS — Z510 Encounter for antineoplastic radiation therapy: Secondary | ICD-10-CM | POA: Diagnosis not present

## 2020-09-16 ENCOUNTER — Ambulatory Visit
Admission: RE | Admit: 2020-09-16 | Discharge: 2020-09-16 | Disposition: A | Discharge status: Home / Self Care | Payer: Medicare Other | Source: Ambulatory Visit | Attending: Radiation Oncology | Admitting: Radiation Oncology

## 2020-09-16 DIAGNOSIS — C3411 Malignant neoplasm of upper lobe, right bronchus or lung: Secondary | ICD-10-CM

## 2020-09-16 DIAGNOSIS — Z510 Encounter for antineoplastic radiation therapy: Secondary | ICD-10-CM | POA: Diagnosis not present

## 2020-09-17 DIAGNOSIS — I25118 Atherosclerotic heart disease of native coronary artery with other forms of angina pectoris: Secondary | ICD-10-CM | POA: Diagnosis not present

## 2020-09-17 DIAGNOSIS — I10 Essential (primary) hypertension: Secondary | ICD-10-CM | POA: Diagnosis not present

## 2020-09-17 DIAGNOSIS — F320 Major depressive disorder, single episode, mild: Secondary | ICD-10-CM | POA: Diagnosis not present

## 2020-09-17 DIAGNOSIS — I4891 Unspecified atrial fibrillation: Secondary | ICD-10-CM | POA: Diagnosis not present

## 2020-09-17 DIAGNOSIS — J449 Chronic obstructive pulmonary disease, unspecified: Secondary | ICD-10-CM | POA: Diagnosis not present

## 2020-09-17 DIAGNOSIS — K219 Gastro-esophageal reflux disease without esophagitis: Secondary | ICD-10-CM | POA: Diagnosis not present

## 2020-09-17 DIAGNOSIS — N400 Enlarged prostate without lower urinary tract symptoms: Secondary | ICD-10-CM | POA: Diagnosis not present

## 2020-09-17 DIAGNOSIS — I480 Paroxysmal atrial fibrillation: Secondary | ICD-10-CM | POA: Diagnosis not present

## 2020-09-17 DIAGNOSIS — E782 Mixed hyperlipidemia: Secondary | ICD-10-CM | POA: Diagnosis not present

## 2020-09-18 ENCOUNTER — Encounter: Payer: Self-pay | Admitting: Radiation Oncology

## 2020-09-18 ENCOUNTER — Ambulatory Visit
Admission: RE | Admit: 2020-09-18 | Discharge: 2020-09-18 | Disposition: A | Discharge status: Home / Self Care | Payer: Medicare Other | Source: Ambulatory Visit | Attending: Radiation Oncology | Admitting: Radiation Oncology

## 2020-09-18 DIAGNOSIS — C3490 Malignant neoplasm of unspecified part of unspecified bronchus or lung: Secondary | ICD-10-CM

## 2020-09-18 DIAGNOSIS — Z510 Encounter for antineoplastic radiation therapy: Secondary | ICD-10-CM | POA: Diagnosis not present

## 2020-09-18 DIAGNOSIS — C3411 Malignant neoplasm of upper lobe, right bronchus or lung: Secondary | ICD-10-CM | POA: Diagnosis not present

## 2020-10-21 ENCOUNTER — Ambulatory Visit
Admission: RE | Admit: 2020-10-21 | Discharge: 2020-10-21 | Disposition: A | Discharge status: Home / Self Care | Payer: Medicare Other | Source: Ambulatory Visit | Attending: Radiation Oncology | Admitting: Radiation Oncology

## 2020-10-21 ENCOUNTER — Encounter: Payer: Self-pay | Admitting: Radiation Oncology

## 2020-10-21 DIAGNOSIS — Z79899 Other long term (current) drug therapy: Secondary | ICD-10-CM | POA: Insufficient documentation

## 2020-10-21 DIAGNOSIS — Z923 Personal history of irradiation: Secondary | ICD-10-CM | POA: Insufficient documentation

## 2020-10-21 DIAGNOSIS — Z7901 Long term (current) use of anticoagulants: Secondary | ICD-10-CM | POA: Diagnosis not present

## 2020-10-21 DIAGNOSIS — R911 Solitary pulmonary nodule: Secondary | ICD-10-CM | POA: Insufficient documentation

## 2020-10-21 DIAGNOSIS — C3411 Malignant neoplasm of upper lobe, right bronchus or lung: Secondary | ICD-10-CM

## 2020-10-21 NOTE — Progress Notes (Signed)
  Patient Name: RAEKWAN SPELMAN MRN: [id number] DOB: 05-24-1956 Referring Physician: Baltazar Apo (Profile Not Attached) Date of Service: 09/18/2020 [facility], [place]                                                        End Of Treatment Note  Diagnoses: R91.1-Solitary pulmonary nodule  Cancer Staging: Right upper lobe pulmonary nodule, presumed non-small cell carcinoma  Intent: Curative  Radiation Treatment Dates: 09/11/2020 through 09/18/2020  Site / Technique / Total Dose (Gy) / Dose per Fx (Gy) / Completed Fx / Beam Energies Right lung; IMRT (SBRT). A total of 54 Gy delivered at 18 Gy per fraction for a total of 3 fractions. 6XFFF beam energy.  Narrative: The patient tolerated radiation therapy relatively well. He did report a production cough in the morning with white sputum. He also reported shortness of breath with exertion and difficulty swallowing. He denied painful swallowing and skin changes.  Plan: The patient will follow-up with radiation oncology in one month.  ________________________________________________   Blair Promise, PhD, MD  This document serves as a record of services personally performed by Gery Pray, MD. It was created on his behalf by Clerance Lav, a trained medical scribe. The creation of this record is based on the scribe's personal observations and the provider's statements to them. This document has been checked and approved by the attending provider.

## 2020-10-21 NOTE — Progress Notes (Signed)
Patient is here today for 1 month follow up post radiation completed 08/2020.  He reports having pain in the back from falls.  Patient reports shortness of breath with mild exertion.  Reports cough thick mucus; denies blood in sputum.  Reports mild fatigue.  Appetite is good.  Denies issues with swallowing or choking.  Denies diarrhea or constipation.   Patient reports delayed urination and slow stream.    Vitals:   10/21/20 1558  BP: 135/73  Pulse: 66  Resp: 20  Temp: 97.6 F (36.4 C)  SpO2: 97%  Weight: 253 lb (114.8 kg)  Height: 5\' 5"  (1.651 m)

## 2020-10-21 NOTE — Progress Notes (Signed)
Radiation Oncology         (336) [phone] ________________________________  Name: Daniel Ball MRN: [id number]  Date: 10/21/2020  DOB: 01-14-1956  Follow-Up Visit Note  CC: [name], MD  Collene Gobble, MD    ICD-10-CM   1. Malignant neoplasm of right upper lobe of lung [facility])  C34.11 CT CHEST WO CONTRAST    Diagnosis:   Right upper lobe pulmonary nodule, presumed non-small cell carcinoma  Interval Since Last Radiation:  1 months  Narrative:  The patient returns today for routine follow-up.  He reports tolerating the SBRT well without any side effects during or after his treatment.  He denies any chest pain significant change in his cough or hemoptysis.                              ALLERGIES:  has No Known Allergies.  Meds: Current Outpatient Medications  Medication Sig Dispense Refill  . albuterol (VENTOLIN HFA) 108 (90 Base) MCG/ACT inhaler Inhale 2 puffs into the lungs every 6 (six) hours as needed for wheezing or shortness of breath.    [name] amLODipine (NORVASC) 5 MG tablet Take 1 tablet (5 mg total) by mouth 2 (two) times daily. 180 tablet 3  . apixaban (ELIQUIS) 5 MG TABS tablet Take 1 tablet (5 mg total) by mouth 2 (two) times daily. Okay to restart this medication on 07/31/2020. 60 tablet 5  . buPROPion (WELLBUTRIN XL) 300 MG 24 hr tablet Take 300 mg by mouth daily.    . clopidogrel (PLAVIX) 75 MG tablet Take 1 tablet (75 mg total) by mouth daily with breakfast. Okay to restart this medication on 07/31/2020.    . furosemide (LASIX) 40 MG tablet Take 1 tablet (40 mg total) by mouth daily as needed.    . isosorbide mononitrate (IMDUR) 60 MG 24 hr tablet Take 60 mg by mouth daily.    . metoprolol succinate (TOPROL XL) 25 MG 24 hr tablet Take 1 tablet (25 mg total) by mouth daily. 90 tablet 1  . nitroGLYCERIN (NITROSTAT) 0.4 MG SL tablet Place 1 tablet (0.4 mg total) under the tongue every 5 (five) minutes x 3 doses as needed for chest pain.    . pantoprazole (PROTONIX)  40 MG tablet Take 1 tablet (40 mg total) by mouth daily. 60 tablet 2  . potassium chloride SA (KLOR-CON) 20 MEQ tablet Take 1 tablet (20 mEq total) by mouth daily as needed (take on the days you take Lasix.). 30 tablet 5  . rosuvastatin (CRESTOR) 40 MG tablet Take 1 tablet (40 mg total) by mouth daily. 90 tablet 3  . Tiotropium Bromide Monohydrate (SPIRIVA RESPIMAT) 2.5 MCG/ACT AERS Inhale 2 puffs into the lungs daily. 4 g 0  . valsartan (DIOVAN) 320 MG tablet Take 320 mg by mouth daily.     No current facility-administered medications for this encounter.    Physical Findings: The patient is in no acute distress. Patient is alert and oriented.  height is 5\' 5"  (1.651 m) and weight is 114.8 kg. His temperature is 97.6 F (36.4 C). His blood pressure is 135/73 and his pulse is 66. His respiration is 20 and oxygen saturation is 97%. .  Lungs are clear to auscultation bilaterally. Heart has regular rate and rhythm. No palpable cervical, supraclavicular, or axillary adenopathy. Abdomen soft, non-tender, normal bowel sounds.  Lab Findings: Lab Results  Component Value Date   WBC 7.6 07/30/2020   HGB  14.3 07/30/2020   HCT 43.6 07/30/2020   MCV 94.4 07/30/2020   PLT 115 (L) 07/30/2020    Radiographic Findings: No results found.  Impression: The patient tolerated his SBRT well without any side effects.  Plan: Patient will be scheduled for a CT scan of the chest in 3 months.  He wishes to have this scheduled at [facility] and I placed an order this evening.  Patient will be seen for follow-up after his chest CT scan for exam and review of imaging studies.  ____________________________________   Blair Promise, PhD, MD  This document serves as a record of services personally performed by Gery Pray, MD. It was created on his behalf by Clerance Lav, a trained medical scribe. The creation of this record is based on the scribe's personal observations and the provider's statements to them.  This document has been checked and approved by the attending provider.

## 2020-10-24 DIAGNOSIS — R040 Epistaxis: Secondary | ICD-10-CM | POA: Diagnosis not present

## 2020-10-24 DIAGNOSIS — I10 Essential (primary) hypertension: Secondary | ICD-10-CM | POA: Diagnosis not present

## 2020-10-24 DIAGNOSIS — D696 Thrombocytopenia, unspecified: Secondary | ICD-10-CM | POA: Diagnosis not present

## 2020-10-24 DIAGNOSIS — F320 Major depressive disorder, single episode, mild: Secondary | ICD-10-CM | POA: Diagnosis not present

## 2020-10-24 DIAGNOSIS — I25118 Atherosclerotic heart disease of native coronary artery with other forms of angina pectoris: Secondary | ICD-10-CM | POA: Diagnosis not present

## 2020-10-24 DIAGNOSIS — Z85118 Personal history of other malignant neoplasm of bronchus and lung: Secondary | ICD-10-CM | POA: Diagnosis not present

## 2020-10-24 DIAGNOSIS — F17200 Nicotine dependence, unspecified, uncomplicated: Secondary | ICD-10-CM | POA: Diagnosis not present

## 2020-10-24 DIAGNOSIS — I700 Atherosclerosis of aorta: Secondary | ICD-10-CM | POA: Diagnosis not present

## 2020-10-24 DIAGNOSIS — G4730 Sleep apnea, unspecified: Secondary | ICD-10-CM | POA: Diagnosis not present

## 2020-10-24 DIAGNOSIS — I480 Paroxysmal atrial fibrillation: Secondary | ICD-10-CM | POA: Diagnosis not present

## 2020-10-24 DIAGNOSIS — J449 Chronic obstructive pulmonary disease, unspecified: Secondary | ICD-10-CM | POA: Diagnosis not present

## 2020-10-24 DIAGNOSIS — R7303 Prediabetes: Secondary | ICD-10-CM | POA: Diagnosis not present

## 2020-10-29 ENCOUNTER — Other Ambulatory Visit: Payer: Self-pay | Admitting: Physician Assistant

## 2020-11-04 ENCOUNTER — Other Ambulatory Visit: Payer: Self-pay | Admitting: Internal Medicine

## 2020-11-05 NOTE — Telephone Encounter (Signed)
Prescription refill request for Eliquis received. Indication: Afib Last office visit: 07/01/20 Scr: 1.07 Age: 65 Weight: 116.1kg  Based on above findings Eliquis 5mg  bid is appropriate dose.  Refilled approved.

## 2020-11-26 DIAGNOSIS — J449 Chronic obstructive pulmonary disease, unspecified: Secondary | ICD-10-CM | POA: Diagnosis not present

## 2020-11-26 DIAGNOSIS — E1169 Type 2 diabetes mellitus with other specified complication: Secondary | ICD-10-CM | POA: Diagnosis not present

## 2020-12-06 ENCOUNTER — Encounter: Payer: Self-pay | Admitting: Dietician

## 2020-12-06 ENCOUNTER — Other Ambulatory Visit: Payer: Self-pay

## 2020-12-06 ENCOUNTER — Encounter: Payer: Medicare Other | Attending: Family Medicine | Admitting: Dietician

## 2020-12-06 VITALS — Ht 64.0 in | Wt 257.6 lb

## 2020-12-06 DIAGNOSIS — E119 Type 2 diabetes mellitus without complications: Secondary | ICD-10-CM

## 2020-12-06 NOTE — Patient Instructions (Signed)
When checking your blood sugar on days that you do it twice, check it 2 hours after you BEGIN eating a meal. Look for no more than 40-60 point jump in your blood sugar.  Work towards eating three meals a day, about 5-6 hours apart!  Begin to recognize carbohydrates in your food choices!  Have 3 carb choices at each meal (45 g).   Begin to build your meals using the proportions of the Balanced Plate. . First, select your carb choice(s) for the meal, and determine how much you should have to equal 3 carb choices (45 g). . Next, select your source of protein to pair with your carb choice(s). . Finally, complete the remaining half of your meal with a variety of non-starchy vegetables.  Remember that unhealthy fats come from animal products. When choosing meats, look for the leanest/lowest fat choice. Move to a 2% milk instead of whole milk.

## 2020-12-06 NOTE — Progress Notes (Signed)
Medical Nutrition Therapy  Appointment Start time:  [phone]  Appointment End time:  1030  Primary concerns today: Diabetes, weight loss  Referral diagnosis: E11.69 Controlled Type 2 Diabetes  Preferred learning style: No preference indicated Learning readiness: Ready   NUTRITION ASSESSMENT   Anthropometrics  Ht: 5'4"  Wt: 257.6 lbs  Body mass index is 44.22 kg/m.   Clinical Medical Hx: HLD, HTN, GERD, OSA Medications: metformin, amolodipine, eliquis, protonix, rosuvastatin, potassium chloride Labs: A1c - 6.9 (10/24/2020), 6.5 (04/03/2020), 6.3 (10/05/2019), 6.1 (05/19/2019) Notable Signs/Symptoms: N/A  Lifestyle & Dietary Hx Pt reports back surgery and hip issues that is causing issues with their leg. Pt states their leg just "don't work". Pt describes it as very discouraging. Pt reports seeing their back surgeon about the legs and surgeon stated they were unable to perform any corrective procedures due to heart and lung problems.  Pt reports previously being in a cardiac fitness program but had to stop due to back pain. Pt states they have decreased lung capacity/breathing difficulty. Pt reports weight gain due to heart medication. Pt states their eating habits have not changed, states UBW is 235. Pt describes themselves as a "meat and potatoes" person. Pt reports they seldom eat breakfast. States that once they start eating they want to eat more and more, so they try to abstain as long as possible.  Pt reports  Cheetos are their favorite snack.  Pt reports son in law is Type 1 diabetic and they have discussions about what they should eat. Pt reports FBG between 167-220.   Estimated daily fluid intake: 64 oz Supplements: N/A Sleep:  Stress / self-care: Low stress, only anxious when short of breath Current average weekly physical activity: ADL  24-Hr Dietary Recall First Meal: coffee with cream and sugar Snack: none Second Meal: PB and J Snack: none Third Meal: Pot roast Snack:  8 oz glass of whole milk, 4 chocolate chip cookies Beverages: coffee, whole milk, propel water (3 bottles, 48 oz)   NUTRITION DIAGNOSIS  NB-1.1 Food and nutrition-related knowledge deficit As related to diabetes.  As evidenced by A1c of 6.9, long history of prediabetes, self reported skipping breakfast, and very low physical activity level.   NUTRITION INTERVENTION  Nutrition education (E-1) on the following topics:  . Educated patient on the pathophysiology of diabetes. This includes why our bodies need circulating blood sugar, the relationship between insulin and blood sugar, and the results of insulin resistance and/or pancreatic insufficiency on the development of diabetes. Educated patient on factors that contribute to elevation of blood sugars, such as stress, illness, injury,and food choices. Discussed the role that physical activity plays in lowering blood sugar. Educate patient on the three main macronutrients. Protein, fats, and carbohydrates. Discussed how each of these macronutrients affect blood sugar levels, especially carbohydrate, and the importance of eating a consistent amount of carbohydrate throughout the day. Educated patient on carbohydrate counting, 15g of carbohydrate equals one carb choice. Advised patient on the importance of consistently checking their blood sugar, and recognizing how lifestyle and food choices affect those numbers. .  Educate pt on factors that can elevate LDL cholesterol, including high dietary intake of saturated fats. Educate pt on identifying sources of saturated fats, and how to make alternative food choices to lower saturated fat intake. Educate on the role of elevated LDL,total cholesterol, and triglycerides on cardiovascular health.  Handouts Provided Include   Balanced Plate  Yellow Meal Card  Learning Style & Readiness for Change Teaching method utilized: Visual &  Auditory  Demonstrated degree of understanding via: Teach Back  Barriers to  learning/adherence to lifestyle change: Limited mobility/breathing capacity  Goals Established by Pt  When checking your blood sugar on days that you do it twice, check it 2 hours after you BEGIN eating a meal. Look for no more than 40-60 point jump in your blood sugar.  Work towards eating three meals a day, about 5-6 hours apart!  Begin to recognize carbohydrates in your food choices!  Have 3 carb choices at each meal (45 g).   Begin to build your meals using the proportions of the Balanced Plate.  First, select your carb choice(s) for the meal, and determine how much you should have to equal 3 carb choices (45 g).  Next, select your source of protein to pair with your carb choice(s).  Finally, complete the remaining half of your meal with a variety of non-starchy vegetables.   Remember that unhealthy fats come from animal products. When choosing meats, look for the leanest/lowest fat choice. Move to a 2% milk instead of whole milk.   MONITORING & EVALUATION Dietary intake, weekly physical activity, and meal frequency in 6 weeks.  Next Steps  Patient is to follow up with RD.

## 2020-12-16 DIAGNOSIS — E782 Mixed hyperlipidemia: Secondary | ICD-10-CM | POA: Diagnosis not present

## 2020-12-16 DIAGNOSIS — K219 Gastro-esophageal reflux disease without esophagitis: Secondary | ICD-10-CM | POA: Diagnosis not present

## 2020-12-16 DIAGNOSIS — I10 Essential (primary) hypertension: Secondary | ICD-10-CM | POA: Diagnosis not present

## 2020-12-16 DIAGNOSIS — N400 Enlarged prostate without lower urinary tract symptoms: Secondary | ICD-10-CM | POA: Diagnosis not present

## 2020-12-16 DIAGNOSIS — F320 Major depressive disorder, single episode, mild: Secondary | ICD-10-CM | POA: Diagnosis not present

## 2020-12-16 DIAGNOSIS — I25118 Atherosclerotic heart disease of native coronary artery with other forms of angina pectoris: Secondary | ICD-10-CM | POA: Diagnosis not present

## 2020-12-16 DIAGNOSIS — J449 Chronic obstructive pulmonary disease, unspecified: Secondary | ICD-10-CM | POA: Diagnosis not present

## 2020-12-16 DIAGNOSIS — I480 Paroxysmal atrial fibrillation: Secondary | ICD-10-CM | POA: Diagnosis not present

## 2020-12-16 DIAGNOSIS — Z85118 Personal history of other malignant neoplasm of bronchus and lung: Secondary | ICD-10-CM | POA: Diagnosis not present

## 2020-12-16 DIAGNOSIS — E1169 Type 2 diabetes mellitus with other specified complication: Secondary | ICD-10-CM | POA: Diagnosis not present

## 2020-12-25 ENCOUNTER — Encounter: Payer: Self-pay | Admitting: Radiation Oncology

## 2020-12-25 DIAGNOSIS — N400 Enlarged prostate without lower urinary tract symptoms: Secondary | ICD-10-CM | POA: Diagnosis not present

## 2020-12-25 DIAGNOSIS — I4891 Unspecified atrial fibrillation: Secondary | ICD-10-CM | POA: Diagnosis not present

## 2020-12-25 DIAGNOSIS — E1169 Type 2 diabetes mellitus with other specified complication: Secondary | ICD-10-CM | POA: Diagnosis not present

## 2020-12-25 DIAGNOSIS — I25118 Atherosclerotic heart disease of native coronary artery with other forms of angina pectoris: Secondary | ICD-10-CM | POA: Diagnosis not present

## 2020-12-25 DIAGNOSIS — E782 Mixed hyperlipidemia: Secondary | ICD-10-CM | POA: Diagnosis not present

## 2020-12-25 DIAGNOSIS — I10 Essential (primary) hypertension: Secondary | ICD-10-CM | POA: Diagnosis not present

## 2020-12-25 DIAGNOSIS — F320 Major depressive disorder, single episode, mild: Secondary | ICD-10-CM | POA: Diagnosis not present

## 2020-12-25 DIAGNOSIS — K219 Gastro-esophageal reflux disease without esophagitis: Secondary | ICD-10-CM | POA: Diagnosis not present

## 2020-12-25 DIAGNOSIS — I480 Paroxysmal atrial fibrillation: Secondary | ICD-10-CM | POA: Diagnosis not present

## 2020-12-25 DIAGNOSIS — Z85118 Personal history of other malignant neoplasm of bronchus and lung: Secondary | ICD-10-CM | POA: Diagnosis not present

## 2020-12-25 DIAGNOSIS — J449 Chronic obstructive pulmonary disease, unspecified: Secondary | ICD-10-CM | POA: Diagnosis not present

## 2021-01-05 ENCOUNTER — Other Ambulatory Visit: Payer: Self-pay | Admitting: Cardiology

## 2021-01-08 ENCOUNTER — Telehealth: Payer: Self-pay

## 2021-01-08 NOTE — Telephone Encounter (Signed)
Pt pharmacy is requesting refill for isosorbide 60 mg qd, would Dr. Harrington Challenger be willing to refill this? And is this the dose she prefers? Please address thank you.

## 2021-01-08 NOTE — Telephone Encounter (Signed)
This is a [place] pt.  °

## 2021-01-08 NOTE — Telephone Encounter (Signed)
Called patient. He is taking IMDUR 60 mg daily. His amlodipine was refilled in Feb 2022 for 5 mg daily so that is what he is currently taking.    I have updated his medication list and his IMDUR was just refilled by [place] office.  He does not have chest pain. He is still SOB on exertion.  He has been dx with non small cell lung cancer and his pulmonary doctor adv that his breathing will not improve.  Will route to Dr. Harrington Challenger to make aware.

## 2021-01-09 ENCOUNTER — Encounter: Payer: Self-pay | Admitting: Radiation Oncology

## 2021-01-13 ENCOUNTER — Encounter: Payer: Self-pay | Admitting: Radiology

## 2021-01-17 ENCOUNTER — Ambulatory Visit (HOSPITAL_COMMUNITY)
Admission: RE | Admit: 2021-01-17 | Discharge: 2021-01-17 | Disposition: A | Discharge status: Home / Self Care | Payer: Medicare Other | Source: Ambulatory Visit | Attending: Radiation Oncology | Admitting: Radiation Oncology

## 2021-01-17 DIAGNOSIS — J929 Pleural plaque without asbestos: Secondary | ICD-10-CM | POA: Diagnosis not present

## 2021-01-17 DIAGNOSIS — C3411 Malignant neoplasm of upper lobe, right bronchus or lung: Secondary | ICD-10-CM

## 2021-01-17 DIAGNOSIS — T797XXA Traumatic subcutaneous emphysema, initial encounter: Secondary | ICD-10-CM | POA: Diagnosis not present

## 2021-01-17 DIAGNOSIS — I2510 Atherosclerotic heart disease of native coronary artery without angina pectoris: Secondary | ICD-10-CM | POA: Diagnosis not present

## 2021-01-17 DIAGNOSIS — C3490 Malignant neoplasm of unspecified part of unspecified bronchus or lung: Secondary | ICD-10-CM | POA: Diagnosis not present

## 2021-01-19 NOTE — Progress Notes (Signed)
Radiation Oncology         (336) [phone] ________________________________  Name: Daniel Ball MRN: [id number]  Date: 01/20/2021  DOB: [birth year]  Follow-Up Visit Note  CC: [name], MD  Collene Gobble, MD    ICD-10-CM   1. Malignant neoplasm of right upper lobe of lung ([place])  C34.11   2. Non-small cell lung cancer, unspecified laterality ([facility])  C34.90     Diagnosis:   Right upper lobe pulmonary nodule, presumed non-small cell carcinoma  Interval Since Last Radiation: Four months and three days  Radiation Treatment Dates: 09/11/2020 through 09/18/2020  Right lung; IMRT (SBRT). A total of 54 Gy delivered at 18 Gy per fraction for a total of 3 fractions. 6XFFF beam energy.  Narrative:  The patient returns today for routine follow-up. Most recent chest CT scan on 01/17/2021 showed post-radiation changes at the site of the previous right upper lobe nodule. There was a 8 x 6 mm nodule in the right chest along the minor fissure that was stable when compared to previous imaging but slightly increased when compared to study from September of 2020. Short interval follow-up was recommended to ensure continued stability. Additionally, there was a small nodule along the right hemidiaphragm that measured 6 mm and was not seen on the prior study. Short interval follow up was also recommended for further assessment.  On review of systems, he reports no new medical issues. He denies cough or hemoptysis.  His breathing is stable.  ALLERGIES:  has No Known Allergies.  Meds: Current Outpatient Medications  Medication Sig Dispense Refill  . albuterol (VENTOLIN HFA) 108 (90 Base) MCG/ACT inhaler Inhale 2 puffs into the lungs every 6 (six) hours as needed for wheezing or shortness of breath.    [name] amLODipine (NORVASC) 5 MG tablet Take 1 tablet by mouth once daily 90 tablet 0  . buPROPion (WELLBUTRIN XL) 300 MG 24 hr tablet Take 300 mg by mouth daily.    . clopidogrel (PLAVIX) 75 MG tablet Take  1 tablet (75 mg total) by mouth daily with breakfast. Okay to restart this medication on 07/31/2020.    [name] ELIQUIS 5 MG TABS tablet Take 1 tablet by mouth twice daily 60 tablet 6  . furosemide (LASIX) 40 MG tablet Take 1 tablet (40 mg total) by mouth daily as needed.    . isosorbide mononitrate (IMDUR) 60 MG 24 hr tablet Take 1 tablet by mouth once daily 90 tablet 3  . metoprolol succinate (TOPROL XL) 25 MG 24 hr tablet Take 1 tablet (25 mg total) by mouth daily. 90 tablet 1  . nitroGLYCERIN (NITROSTAT) 0.4 MG SL tablet Place 1 tablet (0.4 mg total) under the tongue every 5 (five) minutes x 3 doses as needed for chest pain.    . pantoprazole (PROTONIX) 40 MG tablet Take 1 tablet (40 mg total) by mouth daily. 60 tablet 2  . potassium chloride SA (KLOR-CON) 20 MEQ tablet Take 1 tablet (20 mEq total) by mouth daily as needed (take on the days you take Lasix.). 30 tablet 5  . rosuvastatin (CRESTOR) 40 MG tablet Take 1 tablet (40 mg total) by mouth daily. 90 tablet 3  . Tiotropium Bromide Monohydrate (SPIRIVA RESPIMAT) 2.5 MCG/ACT AERS Inhale 2 puffs into the lungs daily. 4 g 0  . valsartan (DIOVAN) 320 MG tablet Take 320 mg by mouth daily.     No current facility-administered medications for this encounter.    Physical Findings: The patient is in no acute distress.  Patient is alert and oriented.  height is 5\' 5"  (1.651 m) and weight is 246 lb (111.6 kg). His temperature is 97.8 F (36.6 C). His blood pressure is 133/64 and his pulse is 64. His respiration is 20 and oxygen saturation is 98%. .  Lungs are clear to auscultation bilaterally. Heart has regular rate and rhythm. No palpable cervical, supraclavicular, or axillary adenopathy. Abdomen soft, non-tender, normal bowel sounds.   Lab Findings: Lab Results  Component Value Date   WBC 7.6 07/30/2020   HGB 14.3 07/30/2020   HCT 43.6 07/30/2020   MCV 94.4 07/30/2020   PLT 115 (L) 07/30/2020    Radiographic Findings: CT CHEST WO  CONTRAST  Result Date: 01/17/2021 CLINICAL DATA:  Non-small cell lung cancer, assess treatment response in this 65 year old male EXAM: CT CHEST WITHOUT CONTRAST TECHNIQUE: Multidetector CT imaging of the chest was performed following the standard protocol without IV contrast. COMPARISON:  July 01, 2020 FINDINGS: Cardiovascular: Calcified atheromatous plaque in the thoracic aorta. Aorta is nonaneurysmal. Central pulmonary vasculature normal caliber. Mild cardiac enlargement. No substantial pericardial effusion. Heart size unchanged from prior imaging. Extensive coronary artery disease, three-vessel disease. Limited assessment of cardiovascular structures given lack of intravenous contrast. Mediastinum/Nodes: Scattered small lymph nodes throughout the chest. RIGHT paratracheal lymph node 12 mm short axis image 53/2. Not changed from previous imaging. Esophagus with grossly normal appearance. Small amount of paraesophageal fluid with herniation of fat adjacent to the esophagus from the abdomen, not changed. This is stable over a series of prior studies dating back to at least September of 2020 no thoracic inlet or axillary lymphadenopathy. Lungs/Pleura: Subpleural reticulation bilaterally is diffuse, mild septal thickening also diffuse and unchanged. At the site of previous nodule in the RIGHT upper lobe is an area of consolidation, mild surrounding ground-glass and septal thickening that measures in total 3.9 x 2.4 cm. The discrete nodule that was visible in this area on the prior study is no longer seen. 8 x 6 mm nodule in the RIGHT chest along the minor fissure is stablecompared to most recent imaging but appears slightly increased compared to the study from September of 2020. Pulmonary emphysema. Small nodule along the RIGHT hemidiaphragm (image 121, series 4) 6 mm not seen on the prior study. Upper Abdomen: No acute upper abdominal process. Herniation of fat about the esophagus at the level of the esophageal  hiatus, passing into the chest. Small hiatal hernia likely associated. Lobular hepatic contours as before. Mild perinephric stranding about the upper pole of LEFT and RIGHT kidney is unchanged. Musculoskeletal: No acute musculoskeletal finding. Spinal degenerative changes. IMPRESSION: 1. Post radiation changes at the site of the previous RIGHT upper lobe nodule. Attention on follow-up. 2. 8 x 6 mm nodule in the RIGHT chest along the minor fissure is stable compared to most recent imaging but appears slightly increased compared to the study from September of 2020. Consider short interval follow-up to ensure continued stability. Additional site of disease is considered. 3. Small nodule along the RIGHT hemidiaphragm 6 mm not seen on the prior study. Short interval follow-up will be helpful for further assessment of this finding as well. 4. Chronic interstitial findings as before. 5. Three-vessel coronary artery disease. 6. Aortic atherosclerosis. 7. Pulmonary emphysema. Aortic Atherosclerosis (ICD10-I70.0) and Emphysema (ICD10-J43.9). Electronically Signed   By: Zetta Bills M.D.   On: 01/17/2021 11:26    Impression:  Right upper lobe pulmonary nodule, presumed non-small cell carcinoma  No evidence of recurrence on clinical exam today. Recent  chest CT scan showed post-radiation changes at the site of the previous right upper lobe nodule. There was a 8 x 6 mm nodule in the right chest along the minor fissure that was stable when compared to previous imaging but slightly increased when compared to study from September of 2020. Short interval follow-up was recommended to ensure continued stability. Additionally, there was a small nodule along the right hemidiaphragm that measured 6 mm and was not seen on the prior study. Short interval follow up was also recommended for further assessment.  Plan: The patient will follow up with radiation oncology in 6 months. He will undergo a chest CT scan prior to that  visit.  Total time spent in this encounter was 20 minutes which included reviewing the patient's most recent chest CT scan, physical examination, ordering of future chest CT scan, and documentation. ____________________________________   Blair Promise, PhD, MD  This document serves as a record of services personally performed by Gery Pray, MD. It was created on his behalf by Clerance Lav, a trained medical scribe. The creation of this record is based on the scribe's personal observations and the provider's statements to them. This document has been checked and approved by the attending provider.

## 2021-01-20 ENCOUNTER — Encounter: Payer: Self-pay | Admitting: Radiation Oncology

## 2021-01-20 ENCOUNTER — Ambulatory Visit
Admission: RE | Admit: 2021-01-20 | Discharge: 2021-01-20 | Disposition: A | Discharge status: Home / Self Care | Payer: Medicare Other | Source: Ambulatory Visit | Attending: Radiation Oncology | Admitting: Radiation Oncology

## 2021-01-20 VITALS — BP 133/64 | HR 64 | Temp 97.8°F | Resp 20 | Ht 65.0 in | Wt 246.0 lb

## 2021-01-20 DIAGNOSIS — C3411 Malignant neoplasm of upper lobe, right bronchus or lung: Secondary | ICD-10-CM

## 2021-01-20 DIAGNOSIS — Z08 Encounter for follow-up examination after completed treatment for malignant neoplasm: Secondary | ICD-10-CM | POA: Diagnosis not present

## 2021-01-20 DIAGNOSIS — R911 Solitary pulmonary nodule: Secondary | ICD-10-CM | POA: Insufficient documentation

## 2021-01-20 DIAGNOSIS — C3490 Malignant neoplasm of unspecified part of unspecified bronchus or lung: Secondary | ICD-10-CM

## 2021-01-20 HISTORY — DX: Type 2 diabetes mellitus without complications: E11.9

## 2021-01-20 HISTORY — DX: Personal history of irradiation: Z92.3

## 2021-01-20 NOTE — Progress Notes (Signed)
Daniel Ball is here today for follow up post radiation to the lung.  Lung Side- Right Pulmonary Nodule  Does the patient complain of any of the following: . Pain: Denies pain . Shortness of breath w/wo exertion: Patient reports shortness of breath with exertion.  . Cough: Productive cough.  . Hemoptysis: no . Pain with swallowing: at times . Swallowing/choking concerns: patient reports feeling like food gets hung up in throat.  [name] Appetite: Good . Energy Level: low  Energy  . Post radiation skin Changes: no     Additional comments if applicable: Vitals:   94/71/25 1048  BP: 133/64  Pulse: 64  Resp: 20  Temp: 97.8 F (36.6 C)  SpO2: 98%  Weight: 246 lb (111.6 kg)  Height: 5\' 5"  (1.651 m)

## 2021-01-20 NOTE — Telephone Encounter (Signed)
REviewed chest CT    No new findings from cardiac perspective Should have fasting lipids      I saw pt in fall   Not sure when f/u set    Please set up for this summer

## 2021-01-21 ENCOUNTER — Telehealth: Payer: Self-pay | Admitting: *Deleted

## 2021-01-21 ENCOUNTER — Other Ambulatory Visit: Payer: Self-pay | Admitting: Radiation Oncology

## 2021-01-21 DIAGNOSIS — E785 Hyperlipidemia, unspecified: Secondary | ICD-10-CM

## 2021-01-21 DIAGNOSIS — C3411 Malignant neoplasm of upper lobe, right bronchus or lung: Secondary | ICD-10-CM

## 2021-01-21 NOTE — Telephone Encounter (Signed)
Per MyChart msg pt is to have lipid profile done and fu visit this summer.

## 2021-01-21 NOTE — Telephone Encounter (Signed)
Replied to patient and included Dr. Alan Ripper nurse in [place] as the pt has mostly been seen in [place] location.

## 2021-01-24 ENCOUNTER — Ambulatory Visit: Payer: Medicare Other | Admitting: Dietician

## 2021-01-30 ENCOUNTER — Other Ambulatory Visit: Payer: Self-pay | Admitting: Internal Medicine

## 2021-01-31 ENCOUNTER — Other Ambulatory Visit (HOSPITAL_COMMUNITY)
Admission: RE | Admit: 2021-01-31 | Discharge: 2021-01-31 | Disposition: A | Discharge status: Home / Self Care | Payer: Medicare Other | Source: Ambulatory Visit | Attending: Internal Medicine | Admitting: Internal Medicine

## 2021-01-31 DIAGNOSIS — E785 Hyperlipidemia, unspecified: Secondary | ICD-10-CM | POA: Diagnosis not present

## 2021-01-31 LAB — LIPID PANEL
Cholesterol: 99 mg/dL (ref 0–200)
HDL: 40 mg/dL — ABNORMAL LOW (ref >40)
LDL Cholesterol: 41 mg/dL (ref 0–99)
Total CHOL/HDL Ratio: 2.5 RATIO
Triglycerides: 89 mg/dL (ref <150)
VLDL: 18 mg/dL (ref 0–40)

## 2021-03-02 ENCOUNTER — Other Ambulatory Visit: Payer: Self-pay | Admitting: Student

## 2021-03-02 ENCOUNTER — Other Ambulatory Visit: Payer: Self-pay | Admitting: Internal Medicine

## 2021-03-09 NOTE — Progress Notes (Signed)
Cardiology Office Note   Date:  03/10/2021   ID:  Daniel Ball, Daniel Ball [birth year], MRN [id number]  PCP:  [name], MD  Cardiologist:   [name], MD   Pt presents for f/u of CAD    History of Present Illness: Daniel Ball is a 65 y.o. male with a history of HTN, COPD, tobacco use, reported OSA  and CAD    He is followed by Dr Dema Severin at [facility].  He had previously seen Thurman Coyer  I saw him in Feb 2021 With symptoms of chest tightness and SOB I recomm L heart cath.  Cardiac cath performed 10/30/2019 revealed 60% proximal LAD that is heavily calcified and hemodynamically significant DFR 0.86 as well as a multifocal RCA disease with long segment of 40% proximal stenosis and irregular distal disease 70 to 80% not hemodynamically significant DFR 0.94.  Also focal ostial stenosis in the right PDA 70%, mild reduced LV function EF 45 to 50% mildly elevated right heart filling pressures.  Rec were for  optimizing medical therapy for CAD and cardiomyopathy.  If re angina persisted despite maximum tolerated doses of at least 2 antianginals PCI of the LAD could be considered.  Due to heavy calcification atherectomy would need to be considered.  Prior to discharge the patient went into A. fib with rate controlled.  Eliquis 5 mg twice daily was added for CHA2DS2-VASc of 3 hypertension CAD and CHF. The pt continued to complaine of dyspnea and on 01/18/20 underwent athrectomy to prox/mid LAD with Synergy 3.5x 28 mm DES placed  Recomm Plavix and Eliquis for 6 months the Eliquis and ASA   Crestor increased     The pt was seen by B Strader in May  He said he was disappointed that after the intervention he still had SOB  Imdur did not help and this was disconftinued  I saw the pt in Fall 2021  Since then he breathing has been stable   He still gets SOB with activity but it is stable    He denies CP  No dizziness   Bruises some       Current Meds  Medication Sig   amLODipine (NORVASC) 5 MG tablet Take 1  tablet by mouth once daily   buPROPion (WELLBUTRIN XL) 300 MG 24 hr tablet Take 300 mg by mouth daily.   clopidogrel (PLAVIX) 75 MG tablet Take 1 tablet by mouth once daily with breakfast   ELIQUIS 5 MG TABS tablet Take 1 tablet by mouth twice daily   furosemide (LASIX) 40 MG tablet Take 1 tablet (40 mg total) by mouth daily as needed.   isosorbide mononitrate (IMDUR) 60 MG 24 hr tablet Take 1 tablet by mouth once daily   metFORMIN (GLUCOPHAGE) 500 MG tablet Take by mouth 2 (two) times daily with a meal.   metoprolol succinate (TOPROL XL) 25 MG 24 hr tablet Take 1 tablet (25 mg total) by mouth daily.   nitroGLYCERIN (NITROSTAT) 0.4 MG SL tablet Place 1 tablet (0.4 mg total) under the tongue every 5 (five) minutes x 3 doses as needed for chest pain.   pantoprazole (PROTONIX) 40 MG tablet Take 1 tablet (40 mg total) by mouth daily.   potassium chloride SA (KLOR-CON) 20 MEQ tablet TAKE 1 TABLET BY MOUTH ONCE DAILY AS NEEDED - TAKE ON THE DAYS YOU TAKE LASIX   rosuvastatin (CRESTOR) 40 MG tablet Take 1 tablet (40 mg total) by mouth daily.   valsartan (DIOVAN) 320 MG tablet  Take 320 mg by mouth daily.     Allergies:   Patient has no known allergies.   Past Medical History:  Diagnosis Date   Arthritis    Atrial fibrillation (HCC)    COPD (chronic obstructive pulmonary disease) ([place])    Coronary artery disease    a. 12/2019: s/p orbital atherectomy and DES placement to proximal/mid LAD.    Diabetes mellitus without complication ([place])    GERD (gastroesophageal reflux disease)    History of radiation therapy 09/11/20, 09/16/21, 09/18/20    Right lung- SBRT Dr. Sondra Come    Hyperlipidemia    Hypertension    hx HBP - MEDS DC'D 10 YRS since BP has been in normal range   Impingement syndrome of shoulder    left   Non-small cell cancer of right lung (HCC)    Pre-diabetes    Shortness of breath    with exertion   Sleep apnea    couldnt tolerate CPAP   Spinal stenosis     Past Surgical  History:  Procedure Laterality Date   BRONCHIAL BIOPSY  04/09/2020   Procedure: BRONCHIAL BIOPSIES;  Surgeon: Collene Gobble, MD;  Location: [facility];  Service: Pulmonary;;   BRONCHIAL BRUSHINGS  04/09/2020   Procedure: BRONCHIAL BRUSHINGS;  Surgeon: Collene Gobble, MD;  Location: [facility];  Service: Pulmonary;;   BRONCHIAL BRUSHINGS  07/30/2020   Procedure: BRONCHIAL BRUSHINGS;  Surgeon: Collene Gobble, MD;  Location: [facility];  Service: Pulmonary;;   BRONCHIAL NEEDLE ASPIRATION BIOPSY  04/09/2020   Procedure: BRONCHIAL NEEDLE ASPIRATION BIOPSIES;  Surgeon: Collene Gobble, MD;  Location: [facility];  Service: Pulmonary;;   BRONCHIAL NEEDLE ASPIRATION BIOPSY  07/30/2020   Procedure: BRONCHIAL NEEDLE ASPIRATION BIOPSIES;  Surgeon: Collene Gobble, MD;  Location: [facility];  Service: Pulmonary;;   BRONCHIAL WASHINGS  04/09/2020   Procedure: BRONCHIAL WASHINGS;  Surgeon: Collene Gobble, MD;  [facility];  Service: Pulmonary;;   BRONCHIAL WASHINGS  07/30/2020   Procedure: BRONCHIAL WASHINGS;  Surgeon: Collene Gobble, MD;  Location: [facility];  Service: Pulmonary;;   CORONARY ATHERECTOMY N/A 01/18/2020   Procedure: CORONARY ATHERECTOMY;  Surgeon: Nelva Bush, MD;  Location: [facility];  Service: Cardiovascular;  Laterality: N/A;   CORONARY STENT INTERVENTION  01/18/2020   CORONARY STENT INTERVENTION N/A 01/18/2020   Procedure: CORONARY STENT INTERVENTION;  Surgeon: Nelva Bush, MD;  Location: [facility];  Service: Cardiovascular;  Laterality: N/A;   EYE SURGERY Left    FIDUCIAL MARKER PLACEMENT  04/09/2020   Procedure: FIDUCIAL MARKER PLACEMENT;  Surgeon: Collene Gobble, MD;  Location: [facility] ENDOSCOPY;  Service: Pulmonary;;   FINE NEEDLE ASPIRATION  04/09/2020   Procedure: FINE NEEDLE ASPIRATION (FNA) LINEAR;  Surgeon: Collene Gobble, MD;  Location: [facility];  Service: Pulmonary;;   HAND SURGERY Right    thumb   INTRAVASCULAR PRESSURE  WIRE/FFR STUDY N/A 11/02/2019   Procedure: INTRAVASCULAR PRESSURE WIRE/FFR STUDY;  Surgeon: Nelva Bush, MD;  Location: [place] [facility];  Service: Cardiovascular;  Laterality: N/A;   INTRAVASCULAR ULTRASOUND/IVUS N/A 01/18/2020   Procedure: Intravascular Ultrasound/IVUS;  Surgeon: Nelva Bush, MD;  Location: [facility];  Service: Cardiovascular;  Laterality: N/A;   LUMBAR LAMINECTOMY/DECOMPRESSION MICRODISCECTOMY N/A 02/07/2014   Procedure: LUMBAR DECOMPRESSION Lumbar one-Lumbar five;  Surgeon: [name], MD;  Location: [facility];  Service: Orthopedics;  Laterality: N/A;   RIGHT/LEFT HEART CATH AND CORONARY ANGIOGRAPHY N/A 11/02/2019   Procedure: RIGHT/LEFT HEART CATH  AND CORONARY ANGIOGRAPHY;  Surgeon: Nelva Bush, MD;  Location: [facility];  Service: Cardiovascular;  Laterality: N/A;   SHOULDER ARTHROSCOPY WITH SUBACROMIAL DECOMPRESSION Left 04/12/2014   Procedure: LEFT SHOULDER ARTHROSCOPY WITH SUBACROMIAL DECOMPRESSION AND LABRAL DEBRIDEMENT, ROTATOR CUFF DEBRIDEMENT, BICEPS DEBRIDEMENT;  Surgeon: [name], MD;  Location: [facility];  Service: Orthopedics;  Laterality: Left;   VIDEO BRONCHOSCOPY WITH ENDOBRONCHIAL NAVIGATION N/A 04/09/2020   Procedure: VIDEO BRONCHOSCOPY WITH ENDOBRONCHIAL NAVIGATION;  Surgeon: Collene Gobble, MD;  Location: [facility];  Service: Pulmonary;  Laterality: N/A;   VIDEO BRONCHOSCOPY WITH ENDOBRONCHIAL NAVIGATION Right 07/30/2020   Procedure: VIDEO BRONCHOSCOPY WITH ENDOBRONCHIAL NAVIGATION;  Surgeon: Collene Gobble, MD;  Location: [facility]COPY;  Service: Pulmonary;  Laterality: Right;   VIDEO BRONCHOSCOPY WITH ENDOBRONCHIAL ULTRASOUND N/A 04/09/2020   Procedure: VIDEO BRONCHOSCOPY WITH ENDOBRONCHIAL ULTRASOUND;  Surgeon: Collene Gobble, MD;  Location: [facility] ENDOSCOPY;  Service: Pulmonary;  Laterality: N/A;   VIDEO BRONCHOSCOPY WITH RADIAL ENDOBRONCHIAL ULTRASOUND  04/09/2020   Procedure: VIDEO BRONCHOSCOPY WITH RADIAL ENDOBRONCHIAL  ULTRASOUND;  Surgeon: Collene Gobble, MD;  Location: [facility];  Service: Pulmonary;;     Social History:  The patient  reports that he has been smoking cigarettes. He has a 27.00 pack-year smoking history. He has never used smokeless tobacco. He reports that he does not drink alcohol and does not use drugs.   Family History:  The patient's Family history is unknown by patient.    ROS:  Please see the history of present illness. All other systems are reviewed and  Negative to the above problem except as noted.    PHYSICAL EXAM: VS:  BP 126/82   Pulse 72   Ht 5\' 5"  (1.651 m)   Wt 241 lb 3.2 oz (109.4 kg)   SpO2 96%   BMI 40.14 kg/m    GEN: Morbidly obese in no acute distress  HEENT: normal  Neck: JVP is not elevated   Cardiac: RRR; no murmurs, No LE edema Respiratory: Some decreased airflow   No rales    GI: soft, nontender, distended because of obesity MS: no deformity Moving all extremities   Skin: warm and dry, no rash   EKG:  EKG is ordered today. Probable SR though cannot exclude ectopic atrial rhythm   72 bpm  T wave inversion   V1 to V6     Lipid Panel    Component Value Date/Time   CHOL 99 01/31/2021 0830   TRIG 89 01/31/2021 0830   HDL 40 (L) 01/31/2021 0830   CHOLHDL 2.5 01/31/2021 0830   VLDL 18 01/31/2021 0830   LDLCALC 41 01/31/2021 0830      Wt Readings from Last 3 Encounters:  03/10/21 241 lb 3.2 oz (109.4 kg)  01/20/21 246 lb (111.6 kg)  12/06/20 257 lb 9.6 oz (116.8 kg)      ASSESSMENT AND PLAN:  1 CAD  I am not convinced of active ischemia   His dyspnea is stable  Keep on current regimen including plavix (not on ASA)  2 Dyspnea  Keep on current regimen      3 HTN  BP controlled on currnet regimen     4   PAF   No recurrence  COntinue anticoagulant    5  HL   Continue Crestor  Last LDL 41        Current medicines are reviewed at length with the patient today.  The patient does not have concerns regarding  medicines.  Signed,  [name], MD  03/10/2021 11:05 PM    [address], [place], [place]  [address] Phone: [phone]; Fax: [phone]

## 2021-03-10 ENCOUNTER — Ambulatory Visit (INDEPENDENT_AMBULATORY_CARE_PROVIDER_SITE_OTHER): Payer: Medicare Other | Admitting: Internal Medicine

## 2021-03-10 ENCOUNTER — Encounter: Payer: Self-pay | Admitting: Internal Medicine

## 2021-03-10 ENCOUNTER — Other Ambulatory Visit: Payer: Self-pay

## 2021-03-10 VITALS — BP 126/82 | HR 72 | Ht 65.0 in | Wt 241.2 lb

## 2021-03-10 DIAGNOSIS — I10 Essential (primary) hypertension: Secondary | ICD-10-CM

## 2021-03-10 NOTE — Patient Instructions (Signed)
Medication Instructions:  Your physician recommends that you continue on your current medications as directed. Please refer to the Current Medication list given to you today.  *If you need a refill on your cardiac medications before your next appointment, please call your pharmacy*   Lab Work: NONE   If you have labs (blood work) drawn today and your tests are completely normal, you will receive your results only by: [place] (if you have MyChart) OR A paper copy in the mail If you have any lab test that is abnormal or we need to change your treatment, we will call you to review the results.   Testing/Procedures: NONE    Follow-Up: At [facility], you and your health needs are our priority.  As part of our continuing mission to provide you with exceptional heart care, we have created designated Provider Care Teams.  These Care Teams include your primary Cardiologist (physician) and Advanced Practice Providers (APPs -  Physician Assistants and Nurse Practitioners) who all work together to provide you with the care you need, when you need it.  We recommend signing up for the patient portal called "MyChart".  Sign up information is provided on this After Visit Summary.  MyChart is used to connect with patients for Virtual Visits (Telemedicine).  Patients are able to view lab/test results, encounter notes, upcoming appointments, etc.  Non-urgent messages can be sent to your provider as well.   To learn more about what you can do with MyChart, go to [email or web address].    Your next appointment:    December or January   The format for your next appointment:   In Person  Provider:   [name], MD   Other Instructions Thank you for choosing [facility]!

## 2021-03-30 DIAGNOSIS — Z20822 Contact with and (suspected) exposure to covid-19: Secondary | ICD-10-CM | POA: Diagnosis not present

## 2021-04-08 DIAGNOSIS — D6869 Other thrombophilia: Secondary | ICD-10-CM | POA: Diagnosis not present

## 2021-04-08 DIAGNOSIS — E782 Mixed hyperlipidemia: Secondary | ICD-10-CM | POA: Diagnosis not present

## 2021-04-08 DIAGNOSIS — D696 Thrombocytopenia, unspecified: Secondary | ICD-10-CM | POA: Diagnosis not present

## 2021-04-08 DIAGNOSIS — Z125 Encounter for screening for malignant neoplasm of prostate: Secondary | ICD-10-CM | POA: Diagnosis not present

## 2021-04-08 DIAGNOSIS — K219 Gastro-esophageal reflux disease without esophagitis: Secondary | ICD-10-CM | POA: Diagnosis not present

## 2021-04-08 DIAGNOSIS — I10 Essential (primary) hypertension: Secondary | ICD-10-CM | POA: Diagnosis not present

## 2021-04-08 DIAGNOSIS — M5136 Other intervertebral disc degeneration, lumbar region: Secondary | ICD-10-CM | POA: Diagnosis not present

## 2021-04-08 DIAGNOSIS — J449 Chronic obstructive pulmonary disease, unspecified: Secondary | ICD-10-CM | POA: Diagnosis not present

## 2021-04-08 DIAGNOSIS — Z0000 Encounter for general adult medical examination without abnormal findings: Secondary | ICD-10-CM | POA: Diagnosis not present

## 2021-04-08 DIAGNOSIS — E1169 Type 2 diabetes mellitus with other specified complication: Secondary | ICD-10-CM | POA: Diagnosis not present

## 2021-04-08 DIAGNOSIS — I700 Atherosclerosis of aorta: Secondary | ICD-10-CM | POA: Diagnosis not present

## 2021-04-08 DIAGNOSIS — J849 Interstitial pulmonary disease, unspecified: Secondary | ICD-10-CM | POA: Diagnosis not present

## 2021-04-08 DIAGNOSIS — I480 Paroxysmal atrial fibrillation: Secondary | ICD-10-CM | POA: Diagnosis not present

## 2021-04-09 DIAGNOSIS — Z23 Encounter for immunization: Secondary | ICD-10-CM | POA: Diagnosis not present

## 2021-05-05 ENCOUNTER — Other Ambulatory Visit: Payer: Self-pay | Admitting: Internal Medicine

## 2021-06-03 ENCOUNTER — Other Ambulatory Visit: Payer: Self-pay | Admitting: Internal Medicine

## 2021-06-06 ENCOUNTER — Other Ambulatory Visit: Payer: Self-pay | Admitting: Internal Medicine

## 2021-06-18 DIAGNOSIS — I10 Essential (primary) hypertension: Secondary | ICD-10-CM | POA: Diagnosis not present

## 2021-06-18 DIAGNOSIS — G4733 Obstructive sleep apnea (adult) (pediatric): Secondary | ICD-10-CM | POA: Diagnosis not present

## 2021-06-18 DIAGNOSIS — J849 Interstitial pulmonary disease, unspecified: Secondary | ICD-10-CM | POA: Diagnosis not present

## 2021-06-18 DIAGNOSIS — J449 Chronic obstructive pulmonary disease, unspecified: Secondary | ICD-10-CM | POA: Diagnosis not present

## 2021-06-24 ENCOUNTER — Encounter: Payer: Self-pay | Admitting: Radiation Oncology

## 2021-06-25 ENCOUNTER — Telehealth: Payer: Self-pay | Admitting: *Deleted

## 2021-06-25 ENCOUNTER — Other Ambulatory Visit: Payer: Self-pay | Admitting: Radiation Oncology

## 2021-06-25 DIAGNOSIS — C3411 Malignant neoplasm of upper lobe, right bronchus or lung: Secondary | ICD-10-CM

## 2021-06-25 NOTE — Telephone Encounter (Signed)
CALLED PATIENT TO INFORM OF CT FOR 07-24-21- ARRIVAL TIME- 11:45 AM @ [facility], NO RESTRICTIONS TO TEST, PATIENT TO RECEIVE RESULTS FROM DR. KINARD ON 07-28-21 @ 11:15 AM, SPOKE WITH PATIENT AND HE IS AWARE OF THESE APPTS.

## 2021-07-07 ENCOUNTER — Other Ambulatory Visit: Payer: Self-pay | Admitting: Internal Medicine

## 2021-07-15 ENCOUNTER — Other Ambulatory Visit: Payer: Self-pay | Admitting: Internal Medicine

## 2021-07-17 DIAGNOSIS — Z23 Encounter for immunization: Secondary | ICD-10-CM | POA: Diagnosis not present

## 2021-07-22 DIAGNOSIS — L304 Erythema intertrigo: Secondary | ICD-10-CM | POA: Diagnosis not present

## 2021-07-22 DIAGNOSIS — L400 Psoriasis vulgaris: Secondary | ICD-10-CM | POA: Diagnosis not present

## 2021-07-24 ENCOUNTER — Other Ambulatory Visit: Payer: Self-pay

## 2021-07-24 ENCOUNTER — Ambulatory Visit (HOSPITAL_COMMUNITY)
Admission: RE | Admit: 2021-07-24 | Discharge: 2021-07-24 | Disposition: A | Discharge status: Home / Self Care | Payer: Medicare Other | Source: Ambulatory Visit | Attending: Radiation Oncology | Admitting: Radiation Oncology

## 2021-07-24 DIAGNOSIS — C3411 Malignant neoplasm of upper lobe, right bronchus or lung: Secondary | ICD-10-CM | POA: Insufficient documentation

## 2021-07-24 DIAGNOSIS — J439 Emphysema, unspecified: Secondary | ICD-10-CM | POA: Diagnosis not present

## 2021-07-24 DIAGNOSIS — C3490 Malignant neoplasm of unspecified part of unspecified bronchus or lung: Secondary | ICD-10-CM | POA: Diagnosis not present

## 2021-07-24 DIAGNOSIS — R911 Solitary pulmonary nodule: Secondary | ICD-10-CM | POA: Diagnosis not present

## 2021-07-24 DIAGNOSIS — I700 Atherosclerosis of aorta: Secondary | ICD-10-CM | POA: Diagnosis not present

## 2021-07-27 NOTE — Progress Notes (Signed)
Radiation Oncology         [phone] ________________________________  Name: Daniel Ball MRN: [id number]  Date: 07/28/2021  DOB: 02/02/1956  Follow-Up Visit Note  CC: [name], MD  Collene Gobble, MD    ICD-10-CM   1. Malignant neoplasm of right upper lobe of lung ([place])  C34.11 NM PET Image Restag (PS) Skull Base To Thigh      Diagnosis:  Right upper lobe pulmonary nodule, presumed non-small cell carcinoma  Interval Since Last Radiation: 10 months and 9 days  Radiation Treatment Dates: 09/11/2020 through 09/18/2020   Right lung; IMRT (SBRT). A total of 54 Gy delivered at 18 Gy per fraction for a total of 3 fractions. 6XFFF beam energy.  Narrative:  The patient returns today for routine follow-up, he was last seen here for follow up on 01/20/21. Most recent chest CT on 07/24/21 showed an interval increase in density of fibrosis about a treated nodule of the peripheral right upper lobe with adjacent fiducial markers, consistent with expected evolution of radiation fibrosis. Underlying nodule could not be clearly appreciated. Additionally, a new nodule of the anterior right lower lobe abutting the major fissure was appreciated, measuring 0.7 cm. Significant interval enlargement was also seen of a subpleural nodule abutting the right hemidiaphragm, measuring approximately 1.6 x 0.8 cm, previously 0.6 cm. These findings were overall noted as highly concerning for metastatic disease. Other findings included some suspected degree of mild underlying pulmonary fibrosis in a pattern with slight apical to basal gradient, featuring irregular peripheral interstitial opacity and ground-glass without evidence of subpleural bronchiolectasis or honeycombing. Findings were noted as suggestive of an alternative diagnosis (not UIP), (fibrotic phase NSIP noted as differential Dx).         In the interval, the patient followed up with his cardiologist, Dr. Harrington Challenger, on 03/10/21. Patient's dyspnea  was noted as stable and well controlled on current regimen including plavix. Overall, the patient was noted to be doing well from a cardiology standpoint.      Patient reports some dyspnea with exertion.  He denies any pain within the chest area significant cough or hemoptysis.                    Allergies:  has No Known Allergies.  Meds: Current Outpatient Medications  Medication Sig Dispense Refill   albuterol (VENTOLIN HFA) 108 (90 Base) MCG/ACT inhaler Inhale 2 puffs into the lungs every 6 (six) hours as needed for wheezing or shortness of breath.     amLODipine (NORVASC) 5 MG tablet Take 1 tablet by mouth once daily 90 tablet 1   buPROPion (WELLBUTRIN XL) 300 MG 24 hr tablet Take 300 mg by mouth daily.     clopidogrel (PLAVIX) 75 MG tablet Take 1 tablet by mouth once daily with breakfast 90 tablet 1   ELIQUIS 5 MG TABS tablet Take 1 tablet by mouth twice daily 60 tablet 6   furosemide (LASIX) 40 MG tablet TAKE 1 TABLET BY MOUTH ONCE DAILY AS NEEDED FOR  EDEMA  OR  WEIGHT  GAIN 90 tablet 2   isosorbide mononitrate (IMDUR) 60 MG 24 hr tablet Take 1 tablet by mouth once daily 90 tablet 3   metFORMIN (GLUCOPHAGE) 500 MG tablet Take by mouth 2 (two) times daily with a meal.     metoprolol succinate (TOPROL-XL) 25 MG 24 hr tablet Take 1 tablet by mouth once daily 90 tablet 2   nitroGLYCERIN (NITROSTAT) 0.4 MG SL tablet Place 1  tablet (0.4 mg total) under the tongue every 5 (five) minutes x 3 doses as needed for chest pain.     pantoprazole (PROTONIX) 40 MG tablet Take 1 tablet (40 mg total) by mouth daily. 60 tablet 2   potassium chloride SA (KLOR-CON) 20 MEQ tablet TAKE 1 TABLET BY MOUTH ONCE DAILY AS NEEDED - TAKE ON THE DAYS YOU TAKE LASIX 90 tablet 1   rosuvastatin (CRESTOR) 40 MG tablet Take 1 tablet by mouth once daily 90 tablet 2   Tiotropium Bromide Monohydrate (SPIRIVA RESPIMAT) 2.5 MCG/ACT AERS Inhale 2 puffs into the lungs daily. 4 g 0   valsartan (DIOVAN) 320 MG tablet Take 320 mg  by mouth daily.     No current facility-administered medications for this encounter.    Physical Findings: The patient is in no acute distress. Patient is alert and oriented.  height is 5\' 5"  (1.651 m) and weight is 248 lb 12.8 oz (112.9 kg). His temperature is 97.9 F (36.6 C). His blood pressure is 136/73 and his pulse is 72. His respiration is 20 and oxygen saturation is 99%. .  No significant changes. Lungs are clear to auscultation bilaterally. Heart has regular rate and rhythm. No palpable cervical, supraclavicular, or axillary adenopathy. Abdomen soft, non-tender, normal bowel sounds.    Lab Findings: Lab Results  Component Value Date   WBC 7.6 07/30/2020   HGB 14.3 07/30/2020   HCT 43.6 07/30/2020   MCV 94.4 07/30/2020   PLT 115 (L) 07/30/2020    Radiographic Findings: CT CHEST WO CONTRAST  Result Date: 07/25/2021 CLINICAL DATA:  Non-small cell lung cancer, assess treatment response, status post radiation therapy EXAM: CT CHEST WITHOUT CONTRAST TECHNIQUE: Multidetector CT imaging of the chest was performed following the standard protocol without IV contrast. COMPARISON:  01/17/2021 FINDINGS: Cardiovascular: Aortic atherosclerosis. Aortic valve calcifications. Normal heart size. Extensive 3 vessel coronary artery calcifications and/or stents. No pericardial effusion. Mediastinum/Nodes: No enlarged mediastinal, hilar, or axillary lymph nodes. Unchanged fat and fluid containing hiatal hernia (series 2, image 114). Thyroid gland, trachea, and esophagus demonstrate no significant findings. Lungs/Pleura: Mild centrilobular and paraseptal emphysema. Diffuse bilateral bronchial wall thickening. Interval increase in density of fibrosis about a treated nodule of the peripheral right upper lobe with adjacent fiducial markers (series 4, image 39). New nodule of the anterior right lower lobe abutting the major fissure measuring 0.7 cm (series 4, image 108). Significant interval enlargement of a  subpleural nodule abutting the right hemidiaphragm, now measuring approximately 1.6 x 0.8 cm, previously 0.6 cm (series 4, image 124, series 5, image 105). Fissural nodule of the right upper lobe abutting the minor fissure is unchanged, measuring 0.8 cm (series 4, image 71). Suspect some degree of mild underlying pulmonary fibrosis in a pattern with slight apical to basal gradient, featuring irregular peripheral interstitial opacity and ground-glass without evidence of subpleural bronchiolectasis or honeycombing. No pleural effusion or pneumothorax. Upper Abdomen: No acute abnormality. Musculoskeletal: No chest wall mass or suspicious bone lesions identified. IMPRESSION: 1. Interval increase in density of fibrosis about a treated nodule of the peripheral right upper lobe with adjacent fiducial markers, consistent with expected evolution of radiation fibrosis. Underlying nodule can not be clearly appreciated. 2. New nodule of the anterior right lower lobe abutting the major fissure measuring 0.7 cm. Significant interval enlargement of a subpleural nodule abutting the right hemidiaphragm, now measuring approximately 1.6 x 0.8 cm, previously 0.6 cm. New and enlarging nodules are highly concerning for metastatic disease. 3. Suspect some degree  of mild underlying pulmonary fibrosis in a pattern with slight apical to basal gradient, featuring irregular peripheral interstitial opacity and ground-glass without evidence of subpleural bronchiolectasis or honeycombing. Findings are suggestive of an alternative diagnosis (not UIP) per consensus guidelines, leading differential consideration fibrotic phase NSIP. Diagnosis of Idiopathic Pulmonary Fibrosis: An Official ATS/ERS/JRS/ALAT Clinical Practice Guideline. [place], Iss 5, [phone], May 22 2017. 4. Emphysema and diffuse bilateral bronchial wall thickening. 5. Coronary artery disease. Aortic Atherosclerosis (ICD10-I70.0) and Emphysema (ICD10-J43.9).  Electronically Signed   By: Delanna Ahmadi M.D.   On: 07/25/2021 08:39    Impression:  Right upper lobe pulmonary nodule, presumed non-small cell carcinoma  No evidence of recurrence on clinical exam today.  I discussed the CT scan findings in detail with the patient and his daughter.  The area that was treated with SBRT is doing well without any evidence of tumor there however patient has 2 other areas of concern.  I discussed PET scan for further evaluation particularly of the larger lesion along the subpleural nodule abutting the right hemidiaphragm.  Patient is in agreement  Plan: PET scan.  Follow-up after PET scan for additional evaluation and planning of potential treatment.   20 minutes of total time was spent for this patient encounter, including preparation, face-to-face counseling with the patient and coordination of care, physical exam, and documentation of the encounter. ____________________________________  Blair Promise, PhD, MD   This document serves as a record of services personally performed by Gery Pray, MD. It was created on his behalf by Roney Mans, a trained medical scribe. The creation of this record is based on the scribe's personal observations and the provider's statements to them. This document has been checked and approved by the attending provider.

## 2021-07-28 ENCOUNTER — Encounter: Payer: Self-pay | Admitting: Radiation Oncology

## 2021-07-28 ENCOUNTER — Ambulatory Visit
Admission: RE | Admit: 2021-07-28 | Discharge: 2021-07-28 | Disposition: A | Discharge status: Home / Self Care | Payer: Medicare Other | Source: Ambulatory Visit | Attending: Radiation Oncology | Admitting: Radiation Oncology

## 2021-07-28 DIAGNOSIS — J84112 Idiopathic pulmonary fibrosis: Secondary | ICD-10-CM | POA: Diagnosis not present

## 2021-07-28 DIAGNOSIS — Z923 Personal history of irradiation: Secondary | ICD-10-CM | POA: Diagnosis not present

## 2021-07-28 DIAGNOSIS — K449 Diaphragmatic hernia without obstruction or gangrene: Secondary | ICD-10-CM | POA: Diagnosis not present

## 2021-07-28 DIAGNOSIS — Z79899 Other long term (current) drug therapy: Secondary | ICD-10-CM | POA: Insufficient documentation

## 2021-07-28 DIAGNOSIS — R918 Other nonspecific abnormal finding of lung field: Secondary | ICD-10-CM | POA: Insufficient documentation

## 2021-07-28 DIAGNOSIS — R911 Solitary pulmonary nodule: Secondary | ICD-10-CM | POA: Diagnosis not present

## 2021-07-28 DIAGNOSIS — Z7984 Long term (current) use of oral hypoglycemic drugs: Secondary | ICD-10-CM | POA: Insufficient documentation

## 2021-07-28 DIAGNOSIS — R0602 Shortness of breath: Secondary | ICD-10-CM | POA: Diagnosis not present

## 2021-07-28 DIAGNOSIS — J432 Centrilobular emphysema: Secondary | ICD-10-CM | POA: Diagnosis not present

## 2021-07-28 DIAGNOSIS — Z08 Encounter for follow-up examination after completed treatment for malignant neoplasm: Secondary | ICD-10-CM | POA: Diagnosis not present

## 2021-07-28 DIAGNOSIS — Z7901 Long term (current) use of anticoagulants: Secondary | ICD-10-CM | POA: Insufficient documentation

## 2021-07-28 DIAGNOSIS — C3411 Malignant neoplasm of upper lobe, right bronchus or lung: Secondary | ICD-10-CM

## 2021-07-28 DIAGNOSIS — I2510 Atherosclerotic heart disease of native coronary artery without angina pectoris: Secondary | ICD-10-CM | POA: Insufficient documentation

## 2021-07-28 NOTE — Progress Notes (Signed)
Daniel Ball is here today for follow up post radiation to the lung.  Lung Side: Right, patient completed treatment on 09/18/20  Does the patient complain of any of the following: Pain: Patient denies pain.  Shortness of breath w/wo exertion: Yes, on exertion.  Cough: Patient reports having a productive cough, producing thick white sputum mostly in the morning.  Hemoptysis: no Pain with swallowing: at times patient has discomfort to throat.  Swallowing/choking concerns: no Appetite: Good Energy Level:  Reports having a low energy level.  Post radiation skin Changes: no    Additional comments if applicable:  Vitals:   18/86/77 1118  BP: 136/73  Pulse: 72  Resp: 20  Temp: 97.9 F (36.6 C)  SpO2: 99%  Weight: 248 lb 12.8 oz (112.9 kg)  Height: 5\' 5"  (1.651 m)

## 2021-08-07 ENCOUNTER — Ambulatory Visit (HOSPITAL_COMMUNITY)
Admission: RE | Admit: 2021-08-07 | Discharge: 2021-08-07 | Disposition: A | Discharge status: Home / Self Care | Payer: Medicare Other | Source: Ambulatory Visit | Attending: Radiation Oncology | Admitting: Radiation Oncology

## 2021-08-07 ENCOUNTER — Other Ambulatory Visit: Payer: Self-pay

## 2021-08-07 DIAGNOSIS — C3490 Malignant neoplasm of unspecified part of unspecified bronchus or lung: Secondary | ICD-10-CM | POA: Diagnosis not present

## 2021-08-07 DIAGNOSIS — J984 Other disorders of lung: Secondary | ICD-10-CM | POA: Diagnosis not present

## 2021-08-07 DIAGNOSIS — C3411 Malignant neoplasm of upper lobe, right bronchus or lung: Secondary | ICD-10-CM | POA: Insufficient documentation

## 2021-08-07 DIAGNOSIS — S2231XA Fracture of one rib, right side, initial encounter for closed fracture: Secondary | ICD-10-CM | POA: Diagnosis not present

## 2021-08-07 DIAGNOSIS — I2510 Atherosclerotic heart disease of native coronary artery without angina pectoris: Secondary | ICD-10-CM | POA: Diagnosis not present

## 2021-08-07 LAB — GLUCOSE, CAPILLARY: Glucose-Capillary: 138 mg/dL — ABNORMAL HIGH (ref 70–99)

## 2021-08-07 MED ORDER — FLUDEOXYGLUCOSE F - 18 (FDG) INJECTION
12.4000 | Freq: Once | INTRAVENOUS | Status: AC
  Administered 2021-08-07: 07:00:00 12.3 via INTRAVENOUS

## 2021-08-18 ENCOUNTER — Telehealth: Payer: Self-pay | Admitting: *Deleted

## 2021-08-18 NOTE — Telephone Encounter (Signed)
CALLED PATIENT TO INFORM OF FU APPT. ON 08-21-21 @ 11:15 AM, SPOKE WITH PATIENT AND HE IS AWARE OF THIS APPT.

## 2021-08-20 ENCOUNTER — Other Ambulatory Visit: Payer: Self-pay

## 2021-08-20 NOTE — Telephone Encounter (Signed)
This is a [place] pt.  °

## 2021-08-20 NOTE — Progress Notes (Signed)
Radiation Oncology         [phone]) [phone] ________________________________  Name: Daniel Ball MRN: [id number]  Date: 08/21/2021  DOB: [birth year]  Follow-Up Visit Note  CC: [name], MD  Collene Gobble, MD    ICD-10-CM   1. Malignant neoplasm of right upper lobe of lung [facility])  C34.11       Diagnosis: Right upper lobe pulmonary nodule, presumed non-small cell carcinoma  Interval Since Last Radiation: 11 months and 2 days   Radiation Treatment Dates: 09/11/2020 through 09/18/2020   Right lung; IMRT (SBRT). A total of 54 Gy delivered at 18 Gy per fraction for a total of 3 fractions. 6XFFF beam energy.  Narrative:  The patient returns today for routine follow-up and to review recent imaging, he was last seen here for follow up on 07/28/21.      PET on 08/07/21 demonstrated a hypermetabolic subpleural nodule along the medial right hemidiaphragm, noted as worrisome for disease recurrence. A separate smaller nodule was also appreciated along the right major fissure, though was too small for PET resolution. Pulmonary parenchymal coarsening was also noted, as well as slight marginal irregularity of the liver suspicious for cirrhosis.  No activity was noted in the site in the right upper lobe where his SBRT was delivered.                       Allergies:  has No Known Allergies.  Meds: Current Outpatient Medications  Medication Sig Dispense Refill   albuterol (VENTOLIN HFA) 108 (90 Base) MCG/ACT inhaler Inhale 2 puffs into the lungs every 6 (six) hours as needed for wheezing or shortness of breath.     amLODipine (NORVASC) 5 MG tablet Take 1 tablet by mouth once daily 90 tablet 1   buPROPion (WELLBUTRIN XL) 300 MG 24 hr tablet Take 300 mg by mouth daily.     clobetasol cream (TEMOVATE) 0.05 % Apply topically.     clopidogrel (PLAVIX) 75 MG tablet Take 1 tablet by mouth once daily with breakfast 90 tablet 1   cyclobenzaprine (FLEXERIL) 10 MG tablet Take 20 mg by mouth as needed  for muscle spasms.     ELIQUIS 5 MG TABS tablet Take 1 tablet by mouth twice daily 60 tablet 6   furosemide (LASIX) 40 MG tablet TAKE 1 TABLET BY MOUTH ONCE DAILY AS NEEDED FOR  EDEMA  OR  WEIGHT  GAIN 90 tablet 2   isosorbide mononitrate (IMDUR) 60 MG 24 hr tablet Take 1 tablet by mouth once daily 90 tablet 3   ketoconazole (NIZORAL) 2 % cream Apply topically daily.     metFORMIN (GLUCOPHAGE) 500 MG tablet Take by mouth 2 (two) times daily with a meal.     metoprolol succinate (TOPROL-XL) 25 MG 24 hr tablet Take 1 tablet by mouth once daily 90 tablet 2   pantoprazole (PROTONIX) 40 MG tablet Take 1 tablet (40 mg total) by mouth daily. 60 tablet 2   potassium chloride SA (KLOR-CON) 20 MEQ tablet TAKE 1 TABLET BY MOUTH ONCE DAILY AS NEEDED - TAKE ON THE DAYS YOU TAKE LASIX 90 tablet 1   rosuvastatin (CRESTOR) 40 MG tablet Take 1 tablet by mouth once daily 90 tablet 2   Tiotropium Bromide Monohydrate (SPIRIVA RESPIMAT) 2.5 MCG/ACT AERS Inhale 2 puffs into the lungs daily. 4 g 0   triamcinolone cream (KENALOG) 0.1 % SMARTSIG:1 Application Topical 2-3 Times Daily     valsartan (DIOVAN) 320 MG tablet Take 320  mg by mouth daily.     nitroGLYCERIN (NITROSTAT) 0.4 MG SL tablet Place 1 tablet (0.4 mg total) under the tongue every 5 (five) minutes x 3 doses as needed for chest pain. 25 tablet 2   No current facility-administered medications for this encounter.    Physical Findings: The patient is in no acute distress. Patient is alert and oriented.  height is 5\' 5"  (1.651 m) and weight is 254 lb 2 oz (115.3 kg). His temporal temperature is 97 F (36.1 C) (abnormal). His blood pressure is 143/75 (abnormal) and his pulse is 74. His respiration is 18 and oxygen saturation is 99%. .   Lungs are clear to auscultation bilaterally. Heart has regular rate and rhythm. No palpable cervical, supraclavicular, or axillary adenopathy.   Lab Findings: Lab Results  Component Value Date   WBC 7.6 07/30/2020   HGB  14.3 07/30/2020   HCT 43.6 07/30/2020   MCV 94.4 07/30/2020   PLT 115 (L) 07/30/2020    Radiographic Findings: CT CHEST WO CONTRAST  Result Date: 07/25/2021 CLINICAL DATA:  Non-small cell lung cancer, assess treatment response, status post radiation therapy EXAM: CT CHEST WITHOUT CONTRAST TECHNIQUE: Multidetector CT imaging of the chest was performed following the standard protocol without IV contrast. COMPARISON:  01/17/2021 FINDINGS: Cardiovascular: Aortic atherosclerosis. Aortic valve calcifications. Normal heart size. Extensive 3 vessel coronary artery calcifications and/or stents. No pericardial effusion. Mediastinum/Nodes: No enlarged mediastinal, hilar, or axillary lymph nodes. Unchanged fat and fluid containing hiatal hernia (series 2, image 114). Thyroid gland, trachea, and esophagus demonstrate no significant findings. Lungs/Pleura: Mild centrilobular and paraseptal emphysema. Diffuse bilateral bronchial wall thickening. Interval increase in density of fibrosis about a treated nodule of the peripheral right upper lobe with adjacent fiducial markers (series 4, image 39). New nodule of the anterior right lower lobe abutting the major fissure measuring 0.7 cm (series 4, image 108). Significant interval enlargement of a subpleural nodule abutting the right hemidiaphragm, now measuring approximately 1.6 x 0.8 cm, previously 0.6 cm (series 4, image 124, series 5, image 105). Fissural nodule of the right upper lobe abutting the minor fissure is unchanged, measuring 0.8 cm (series 4, image 71). Suspect some degree of mild underlying pulmonary fibrosis in a pattern with slight apical to basal gradient, featuring irregular peripheral interstitial opacity and ground-glass without evidence of subpleural bronchiolectasis or honeycombing. No pleural effusion or pneumothorax. Upper Abdomen: No acute abnormality. Musculoskeletal: No chest wall mass or suspicious bone lesions identified. IMPRESSION: 1. Interval  increase in density of fibrosis about a treated nodule of the peripheral right upper lobe with adjacent fiducial markers, consistent with expected evolution of radiation fibrosis. Underlying nodule can not be clearly appreciated. 2. New nodule of the anterior right lower lobe abutting the major fissure measuring 0.7 cm. Significant interval enlargement of a subpleural nodule abutting the right hemidiaphragm, now measuring approximately 1.6 x 0.8 cm, previously 0.6 cm. New and enlarging nodules are highly concerning for metastatic disease. 3. Suspect some degree of mild underlying pulmonary fibrosis in a pattern with slight apical to basal gradient, featuring irregular peripheral interstitial opacity and ground-glass without evidence of subpleural bronchiolectasis or honeycombing. Findings are suggestive of an alternative diagnosis (not UIP) per consensus guidelines, leading differential consideration fibrotic phase NSIP. Diagnosis of Idiopathic Pulmonary Fibrosis: An Official ATS/ERS/JRS/ALAT Clinical Practice Guideline. [place], Iss 5, [phone], May 22 2017. 4. Emphysema and diffuse bilateral bronchial wall thickening. 5. Coronary artery disease. Aortic Atherosclerosis (ICD10-I70.0) and Emphysema (ICD10-J43.9).  Electronically Signed   By: Delanna Ahmadi M.D.   On: 07/25/2021 08:39   NM PET Image Restag (PS) Skull Base To Thigh  Result Date: 08/11/2021 CLINICAL DATA:  Subsequent treatment strategy for lung cancer. EXAM: NUCLEAR MEDICINE PET SKULL BASE TO THIGH TECHNIQUE: 12.3 mCi F-18 FDG was injected intravenously. Full-ring PET imaging was performed from the skull base to thigh after the radiotracer. CT data was obtained and used for attenuation correction and anatomic localization. Fasting blood glucose: 134 mg/dl COMPARISON:  CT chest 07/24/2021 and PET 03/25/2020. FINDINGS: Mediastinal blood pool activity: SUV max 2.8 Liver activity: SUV max NA NECK: No abnormal hypermetabolism.  Incidental CT findings: None. CHEST: No hypermetabolic mediastinal, hilar or axillary lymph nodes. A subpleural nodule along the medial right hemidiaphragm (8/58), better seen and measured on 58/52/7782, is hypermetabolic, SUV max 5.1. A separate 6 mm nodule along the right major fissure (8/51) is too small for PET resolution. Minimal associated hypermetabolism associated with post radiation scarring in the right upper lobe. Incidental CT findings: Atherosclerotic calcification of the aorta, aortic valve and coronary arteries. Heart is enlarged. No pericardial or pleural effusion. Small amount of fluid is seen within prominent fat adjacent to the distal descending thoracic aorta, as before. Evaluation of the lungs is limited due to respiratory motion and expiratory phase imaging but there does appear to be parenchymal coarsening. ABDOMEN/PELVIS: No abnormal hypermetabolism in the liver, adrenal glands, spleen or pancreas. No hypermetabolic lymph nodes. Incidental CT findings: Liver margin is slightly irregular. Liver, gallbladder, adrenal glands, kidneys, spleen, pancreas, stomach and bowel are otherwise unremarkable. Scattered lymph nodes are not enlarged by CT size criteria. SKELETON: No abnormal hypermetabolism. Incidental CT findings: Degenerative changes in the spine.  Old right second rib fracture. IMPRESSION: 1. Hypermetabolic subpleural nodule along the medial right hemidiaphragm, worrisome for disease recurrence. A separate smaller nodule along the right major fissure is too small for PET resolution. 2. Pulmonary parenchymal coarsening, better evaluated on recent CT chest 07/24/2021. 3. Slight marginal irregularity liver raises suspicion for cirrhosis. 4. Aortic atherosclerosis (ICD10-I70.0). Coronary artery calcification. Electronically Signed   By: Lorin Picket M.D.   On: 08/11/2021 13:46    Impression:  Right upper lobe pulmonary nodule, presumed non-small cell carcinoma  The patient has a new  area along the right diaphragm.  I showed the images of this to the patient and his wife.  We discussed potential options.  We discussed consideration for presentation at the multidisciplinary thoracic conference next week for further input concerning management of this lesion.  Patient would like his case presented next week.  Plan: Treatment plan pending discussion at the multidisciplinary thoracic oncology conference.   15 minutes of total time was spent for this patient encounter, including preparation, face-to-face counseling with the patient and coordination of care, physical exam, and documentation of the encounter. ____________________________________  Blair Promise, PhD, MD   This document serves as a record of services personally performed by Gery Pray, MD. It was created on his behalf by Roney Mans, a trained medical scribe. The creation of this record is based on the scribe's personal observations and the provider's statements to them. This document has been checked and approved by the attending provider.

## 2021-08-21 ENCOUNTER — Other Ambulatory Visit: Payer: Self-pay

## 2021-08-21 ENCOUNTER — Encounter: Payer: Self-pay | Admitting: Radiation Oncology

## 2021-08-21 ENCOUNTER — Ambulatory Visit
Admission: RE | Admit: 2021-08-21 | Discharge: 2021-08-21 | Disposition: A | Discharge status: Home / Self Care | Payer: Medicare Other | Source: Ambulatory Visit | Attending: Radiation Oncology | Admitting: Radiation Oncology

## 2021-08-21 VITALS — BP 143/75 | HR 74 | Temp 97.0°F | Resp 18 | Ht 65.0 in | Wt 254.1 lb

## 2021-08-21 DIAGNOSIS — Z08 Encounter for follow-up examination after completed treatment for malignant neoplasm: Secondary | ICD-10-CM | POA: Diagnosis not present

## 2021-08-21 DIAGNOSIS — R911 Solitary pulmonary nodule: Secondary | ICD-10-CM | POA: Diagnosis not present

## 2021-08-21 DIAGNOSIS — C3411 Malignant neoplasm of upper lobe, right bronchus or lung: Secondary | ICD-10-CM | POA: Insufficient documentation

## 2021-08-21 MED ORDER — NITROGLYCERIN 0.4 MG SL SUBL: 0.4000 mg | SUBLINGUAL_TABLET | SUBLINGUAL | 2 refills | Status: DC | PRN

## 2021-08-21 NOTE — Progress Notes (Signed)
Daniel Ball is here today for follow up post radiation to the lung.  Lung Side: right  Completed radiation treatment on: 09/18/2020  Does the patient complain of any of the following: Pain:denies Shortness of breath w/wo exertion: shortness of breath with exertion Cough: productive white to brown to green phlegm Hemoptysis: denies Pain with swallowing: occasional discomfort Swallowing/choking concerns: denies Appetite: good Energy Level: poor Post radiation skin Changes: n/a    Additional comments if applicable: none  Vitals:   08/21/21 1112  BP: (!) 143/75  Pulse: 74  Resp: 18  Temp: (!) 97 F (36.1 C)  TempSrc: Temporal  SpO2: 99%  Weight: 254 lb 2 oz (115.3 kg)  Height: 5\' 5"  (1.651 m)

## 2021-08-28 DIAGNOSIS — Z20822 Contact with and (suspected) exposure to covid-19: Secondary | ICD-10-CM | POA: Diagnosis not present

## 2021-09-03 ENCOUNTER — Other Ambulatory Visit: Payer: Self-pay | Admitting: Internal Medicine

## 2021-09-25 ENCOUNTER — Encounter: Payer: Self-pay | Admitting: *Deleted

## 2021-09-25 ENCOUNTER — Other Ambulatory Visit: Payer: Self-pay | Admitting: *Deleted

## 2021-09-25 ENCOUNTER — Telehealth: Payer: Self-pay | Admitting: Emergency Medicine

## 2021-09-25 DIAGNOSIS — C3411 Malignant neoplasm of upper lobe, right bronchus or lung: Secondary | ICD-10-CM

## 2021-09-25 NOTE — Progress Notes (Signed)
The proposed treatment discussed in conference is for discussion purpose only and is not a binding recommendation. The patient was not been physically examined, or presented with their treatment options. Therefore, final treatment plans cannot be decided.  

## 2021-09-25 NOTE — Telephone Encounter (Signed)
Patient's case was discussed at thoracic conference this am. He has new PET avid nodules in the RLL.   Need to get him into a blocked OV slot to discuss possible navigational FOB.   Thanks.

## 2021-09-25 NOTE — Progress Notes (Signed)
Referral to pulmonary completed per Dr. Sondra Come

## 2021-09-25 NOTE — Telephone Encounter (Signed)
Spoke with pt and pt agreed to have OV on 09/26/21 at 0830. OK's by Dr. Lamonte Sakai. Nothing further needed at this time.

## 2021-09-26 ENCOUNTER — Encounter: Payer: Self-pay | Admitting: Emergency Medicine

## 2021-09-26 ENCOUNTER — Other Ambulatory Visit: Payer: Self-pay

## 2021-09-26 ENCOUNTER — Ambulatory Visit (INDEPENDENT_AMBULATORY_CARE_PROVIDER_SITE_OTHER): Payer: Medicare Other | Admitting: Emergency Medicine

## 2021-09-26 VITALS — BP 128/82 | HR 91 | Temp 97.6°F | Ht 64.0 in | Wt 256.6 lb

## 2021-09-26 DIAGNOSIS — J449 Chronic obstructive pulmonary disease, unspecified: Secondary | ICD-10-CM

## 2021-09-26 DIAGNOSIS — R918 Other nonspecific abnormal finding of lung field: Secondary | ICD-10-CM | POA: Diagnosis not present

## 2021-09-26 MED ORDER — STIOLTO RESPIMAT 2.5-2.5 MCG/ACT IN AERS: 2.0000 | INHALATION_SPRAY | Freq: Every day | RESPIRATORY_TRACT | 0 refills | Status: DC

## 2021-09-26 NOTE — H&P (View-Only) (Signed)
Subjective:    Patient ID: Daniel Ball, male    DOB: 1955/10/10, 66 y.o.   MRN: [id number]  HPI  ROV 09/05/20 --66 year old gentleman, history of tobacco with NSIP/ILD, COPD, OSA.  We have been evaluating an enlarging right upper lobe peripheral nodule, positive on PET scan.  Initial brushings were negative but subsequent sampling 07/30/2020 showed atypical cells.  All consistent with non-small cell lung cancer.  He is planning for stereotactic radiation with Dr. Sondra Come, planning to start.  We tried starting Spiriva at his last visit to see if he would get benefit - no real change. He does use ventolin prn, often in the am to help clear some mucous. He is on BiPAP and is getting good benefit from it. Good compliance.   ROV 09/26/21 --66 year old gentleman with a history of tobacco use, COPD, interstitial disease (NSIP), OSA.  Also with presumed non-small cell lung cancer in the right upper lobe (positive PET, atypical cells on transbronchial brushings).  He underwent SBRT with Dr. Sondra Come December 2021.  He underwent surveillance CT chest and PET scan that show evidence for suspected metastatic disease in the right lower lobe as below.  He presents for discussion regarding possible navigational bronchoscopy, fiducial placement. Current bronchodilator regimen Spiriva but he doesn't use regularly, uses albuterol occasionally. Again unsure whether it is helping him. He has exertional SOB, especially when doing yard chores.  He has been exposed to asbestos, was stationed at [employer]  Smoking 10 cig a day  CT chest 07/24/2021 reviewed by me shows some radiation fibrosis adjacent to the right upper lobe treated nodule.  There is a new anterior right lower lobe nodule that abuts the major fissure 0.7 cm.  Significant enlargement of a subpleural nodule abutting the right hemidiaphragm 1.6 x 0.8 cm.  Diffuse bilateral bronchial wall thickening.  No mediastinal or hilar lymphadenopathy.  PET scan  08/07/2021 shows no evidence of mediastinal or hilar hypermetabolism the subpleural pulmonary nodule is hypermetabolic.  A separate 6 mm nodule along the right major fissure is too small for PET resolution.   Review of Systems As per HPI     Objective:   Physical Exam Vitals:   09/26/21 0820  BP: 128/82  Pulse: 91  Temp: 97.6 F (36.4 C)  TempSrc: Oral  SpO2: 99%  Weight: 256 lb 9.6 oz (116.4 kg)  Height: 5\' 4"  (1.626 m)    Gen: Pleasant, obese, in no distress,  normal affect  ENT: No lesions,  mouth clear,  oropharynx clear, no postnasal drip  Neck: No JVD, no stridor  Lungs: No use of accessory muscles, no crackles or wheezing on normal respiration, no wheeze on forced expiration  Cardiovascular: RRR, heart sounds normal, no murmur or gallops, no peripheral edema  Musculoskeletal: No deformities, no cyanosis or clubbing  Neuro: alert, awake, non focal  Skin: Warm, no lesions or rash      Assessment & Plan:  Pulmonary nodules New pulmonary nodules in the gentleman with a history of presumed non-small cell lung cancer that was treated with SBRT.  1 of these is pleural-based and he does have a history of asbestos exposure, question mesothelioma.  We talked about navigational bronchoscopy for biopsy and also fiducial marking.  He agrees to proceed.  I will try to get this planned for 10/20/2021.  He will need to stop his Plavix and his aspirin.  Discussed with him.  COPD 0/ still smoking  Significant dyspnea.  He has never really responded very  much to inhaled bronchodilators, has mixed disease with some component of interstitial disease and restriction from obesity.  I think is reasonable to do a trial of Stiolto to see if it is better than Spiriva.  If not then we may decide to hold off on further scheduled BD therapy, just use albuterol as needed.  Time spent 44 minutes  Baltazar Apo, MD, PhD 09/26/2021, 9:03 AM [facility] [phone] or if  no answer [phone]

## 2021-09-26 NOTE — Progress Notes (Signed)
Subjective:    Patient ID: Daniel Ball, male    DOB: 14-Oct-1955, 66 y.o.   MRN: [id number]  HPI  ROV 09/05/20 --66 year old gentleman, history of tobacco with NSIP/ILD, COPD, OSA.  We have been evaluating an enlarging right upper lobe peripheral nodule, positive on PET scan.  Initial brushings were negative but subsequent sampling 07/30/2020 showed atypical cells.  All consistent with non-small cell lung cancer.  He is planning for stereotactic radiation with Dr. Sondra Come, planning to start.  We tried starting Spiriva at his last visit to see if he would get benefit - no real change. He does use ventolin prn, often in the am to help clear some mucous. He is on BiPAP and is getting good benefit from it. Good compliance.   ROV 09/26/21 --66 year old gentleman with a history of tobacco use, COPD, interstitial disease (NSIP), OSA.  Also with presumed non-small cell lung cancer in the right upper lobe (positive PET, atypical cells on transbronchial brushings).  He underwent SBRT with Dr. Sondra Come December 2021.  He underwent surveillance CT chest and PET scan that show evidence for suspected metastatic disease in the right lower lobe as below.  He presents for discussion regarding possible navigational bronchoscopy, fiducial placement. Current bronchodilator regimen Spiriva but he doesn't use regularly, uses albuterol occasionally. Again unsure whether it is helping him. He has exertional SOB, especially when doing yard chores.  He has been exposed to asbestos, was stationed at [employer]  Smoking 10 cig a day  CT chest 07/24/2021 reviewed by me shows some radiation fibrosis adjacent to the right upper lobe treated nodule.  There is a new anterior right lower lobe nodule that abuts the major fissure 0.7 cm.  Significant enlargement of a subpleural nodule abutting the right hemidiaphragm 1.6 x 0.8 cm.  Diffuse bilateral bronchial wall thickening.  No mediastinal or hilar lymphadenopathy.  PET scan  08/07/2021 shows no evidence of mediastinal or hilar hypermetabolism the subpleural pulmonary nodule is hypermetabolic.  A separate 6 mm nodule along the right major fissure is too small for PET resolution.   Review of Systems As per HPI     Objective:   Physical Exam Vitals:   09/26/21 0820  BP: 128/82  Pulse: 91  Temp: 97.6 F (36.4 C)  TempSrc: Oral  SpO2: 99%  Weight: 256 lb 9.6 oz (116.4 kg)  Height: 5\' 4"  (1.626 m)    Gen: Pleasant, obese, in no distress,  normal affect  ENT: No lesions,  mouth clear,  oropharynx clear, no postnasal drip  Neck: No JVD, no stridor  Lungs: No use of accessory muscles, no crackles or wheezing on normal respiration, no wheeze on forced expiration  Cardiovascular: RRR, heart sounds normal, no murmur or gallops, no peripheral edema  Musculoskeletal: No deformities, no cyanosis or clubbing  Neuro: alert, awake, non focal  Skin: Warm, no lesions or rash      Assessment & Plan:  Pulmonary nodules New pulmonary nodules in the gentleman with a history of presumed non-small cell lung cancer that was treated with SBRT.  1 of these is pleural-based and he does have a history of asbestos exposure, question mesothelioma.  We talked about navigational bronchoscopy for biopsy and also fiducial marking.  He agrees to proceed.  I will try to get this planned for 10/20/2021.  He will need to stop his Plavix and his aspirin.  Discussed with him.  COPD 0/ still smoking  Significant dyspnea.  He has never really responded very  much to inhaled bronchodilators, has mixed disease with some component of interstitial disease and restriction from obesity.  I think is reasonable to do a trial of Stiolto to see if it is better than Spiriva.  If not then we may decide to hold off on further scheduled BD therapy, just use albuterol as needed.  Time spent 44 minutes  Baltazar Apo, MD, PhD 09/26/2021, 9:03 AM [facility] [phone] or if  no answer [phone]

## 2021-09-26 NOTE — Assessment & Plan Note (Signed)
New pulmonary nodules in the gentleman with a history of presumed non-small cell lung cancer that was treated with SBRT.  1 of these is pleural-based and he does have a history of asbestos exposure, question mesothelioma.  We talked about navigational bronchoscopy for biopsy and also fiducial marking.  He agrees to proceed.  I will try to get this planned for 10/20/2021.  He will need to stop his Plavix and his aspirin.  Discussed with him.

## 2021-09-26 NOTE — Addendum Note (Signed)
Addended by: Gavin Potters R on: 09/26/2021 09:37 AM   Modules accepted: Orders

## 2021-09-26 NOTE — Assessment & Plan Note (Signed)
Significant dyspnea.  He has never really responded very much to inhaled bronchodilators, has mixed disease with some component of interstitial disease and restriction from obesity.  I think is reasonable to do a trial of Stiolto to see if it is better than Spiriva.  If not then we may decide to hold off on further scheduled BD therapy, just use albuterol as needed.

## 2021-09-26 NOTE — Patient Instructions (Signed)
We will work on setting up navigational bronchoscopy to evaluate your right lung pulmonary nodules.  This will be done under general anesthesia as an outpatient at [facility] endoscopy.  We will try to get this set up for 10/20/2021.  You will need a designated driver.  We will plan to stop your aspirin 2 days prior, stop your Plavix 5 days prior. We will do a trial of Stiolto 2 puffs once daily to see if this helps your breathing more than your Spiriva.  Keep track of whether it helps so we can talk about possibly continuing it if you benefit Keep your albuterol available to use 2 puffs when needed for shortness of breath, chest tightness, wheezing. Continue to work on decreasing your cigarettes Follow with Dr Lamonte Sakai in 1 month

## 2021-09-29 ENCOUNTER — Other Ambulatory Visit: Payer: Self-pay

## 2021-09-29 DIAGNOSIS — R918 Other nonspecific abnormal finding of lung field: Secondary | ICD-10-CM

## 2021-10-09 DIAGNOSIS — R932 Abnormal findings on diagnostic imaging of liver and biliary tract: Secondary | ICD-10-CM | POA: Diagnosis not present

## 2021-10-09 DIAGNOSIS — I10 Essential (primary) hypertension: Secondary | ICD-10-CM | POA: Diagnosis not present

## 2021-10-09 DIAGNOSIS — E1169 Type 2 diabetes mellitus with other specified complication: Secondary | ICD-10-CM | POA: Diagnosis not present

## 2021-10-09 DIAGNOSIS — M5136 Other intervertebral disc degeneration, lumbar region: Secondary | ICD-10-CM | POA: Diagnosis not present

## 2021-10-09 DIAGNOSIS — D696 Thrombocytopenia, unspecified: Secondary | ICD-10-CM | POA: Diagnosis not present

## 2021-10-09 DIAGNOSIS — C3490 Malignant neoplasm of unspecified part of unspecified bronchus or lung: Secondary | ICD-10-CM | POA: Diagnosis not present

## 2021-10-09 DIAGNOSIS — I25118 Atherosclerotic heart disease of native coronary artery with other forms of angina pectoris: Secondary | ICD-10-CM | POA: Diagnosis not present

## 2021-10-09 DIAGNOSIS — J449 Chronic obstructive pulmonary disease, unspecified: Secondary | ICD-10-CM | POA: Diagnosis not present

## 2021-10-09 DIAGNOSIS — E782 Mixed hyperlipidemia: Secondary | ICD-10-CM | POA: Diagnosis not present

## 2021-10-09 DIAGNOSIS — I480 Paroxysmal atrial fibrillation: Secondary | ICD-10-CM | POA: Diagnosis not present

## 2021-10-09 DIAGNOSIS — I700 Atherosclerosis of aorta: Secondary | ICD-10-CM | POA: Diagnosis not present

## 2021-10-09 DIAGNOSIS — D6869 Other thrombophilia: Secondary | ICD-10-CM | POA: Diagnosis not present

## 2021-10-09 DIAGNOSIS — Z7984 Long term (current) use of oral hypoglycemic drugs: Secondary | ICD-10-CM | POA: Diagnosis not present

## 2021-10-13 ENCOUNTER — Other Ambulatory Visit: Payer: Self-pay

## 2021-10-13 ENCOUNTER — Ambulatory Visit (HOSPITAL_COMMUNITY)
Admission: RE | Admit: 2021-10-13 | Discharge: 2021-10-13 | Disposition: A | Discharge status: Home / Self Care | Payer: Medicare Other | Source: Ambulatory Visit | Attending: Emergency Medicine | Admitting: Emergency Medicine

## 2021-10-13 DIAGNOSIS — R911 Solitary pulmonary nodule: Secondary | ICD-10-CM | POA: Diagnosis not present

## 2021-10-13 DIAGNOSIS — R918 Other nonspecific abnormal finding of lung field: Secondary | ICD-10-CM | POA: Diagnosis not present

## 2021-10-13 DIAGNOSIS — I700 Atherosclerosis of aorta: Secondary | ICD-10-CM | POA: Diagnosis not present

## 2021-10-13 DIAGNOSIS — R0600 Dyspnea, unspecified: Secondary | ICD-10-CM | POA: Diagnosis not present

## 2021-10-13 DIAGNOSIS — J439 Emphysema, unspecified: Secondary | ICD-10-CM | POA: Diagnosis not present

## 2021-10-15 ENCOUNTER — Telehealth: Payer: Self-pay | Admitting: Emergency Medicine

## 2021-10-16 NOTE — Telephone Encounter (Signed)
Called patient and went over the instructions with him

## 2021-10-17 ENCOUNTER — Encounter (HOSPITAL_COMMUNITY): Payer: Self-pay | Admitting: Emergency Medicine

## 2021-10-17 ENCOUNTER — Other Ambulatory Visit: Payer: Self-pay

## 2021-10-17 NOTE — Progress Notes (Signed)
Spoke with pt for pre-op call. Pt has hx of A-fib and CAD. Pt's cardiologist is Dr. Harrington Challenger. Pt is on Plavix and Eliquis. I asked pt when he was supposed to hold these medications and he said he "thinks" Plavix is to be held 5 days and Eliquis for 2 days or "maybe 5 days". I asked him when his last dose of Plavix was and he said it was 10/15/21 and he stated that he stopped Eliquis then too. While we were talking I sent a secure chat message to Dr. Lamonte Sakai to ask what he had told pt. Dr. Lamonte Sakai responded that it should be Plavix 5 days and Eliquis 2 days. He stated that pt could take Eliquis today and not take until after surgery or he could just stay off Eliquis until after surgery. I gave the patient this information.   Pt is a type 2 diabetic. Last A1C was 7.3 on 10/09/21. He states his fasting blood sugar is usually around 135. Instructed pt not to take his Rybelsus and Metformin the day of the procedure. Instructed him to check his blood sugar the morning of surgery when he gets up and every 2 hours until he leaves for the hospital. If blood sugar is 70 or below, treat with 1/2 cup of clear juice (apple or cranberry) and recheck blood sugar 15 minutes after drinking juice. If blood sugar continues to be 70 or below, call the Short Stay department and ask to speak to a nurse. Pt voiced understanding.  [place] out of town. Pt will need Covid test done on arrival on Monday.

## 2021-10-17 NOTE — Anesthesia Preprocedure Evaluation (Addendum)
Anesthesia Evaluation  Patient identified by MRN, date of birth, ID band Patient awake    Reviewed: Allergy & Precautions, NPO status , Patient's Chart, lab work & pertinent test results  History of Anesthesia Complications Negative for: history of anesthetic complications  Airway Mallampati: IV  TM Distance: >3 FB Neck ROM: Full    Dental  (+) Dental Advisory Given, Edentulous Upper, Edentulous Lower   Pulmonary shortness of breath, sleep apnea , COPD, Current Smoker and Patient abstained from smoking.,    breath sounds clear to auscultation       Cardiovascular hypertension, (-) angina+ CAD and + Cardiac Stents   Rhythm:Regular     Neuro/Psych negative neurological ROS  negative psych ROS   GI/Hepatic Neg liver ROS, GERD  ,  Endo/Other  diabetesMorbid obesity  Renal/GU negative Renal ROS     Musculoskeletal  (+) Arthritis , Fibromyalgia -  Abdominal   Peds  Hematology eliquis  Lab Results      Component                Value               Date                      WBC                      7.6                 07/30/2020                HGB                      14.3                07/30/2020                HCT                      43.6                07/30/2020                MCV                      94.4                07/30/2020                PLT                      115 (L)             07/30/2020              Anesthesia Other Findings   Reproductive/Obstetrics                            Anesthesia Physical Anesthesia Plan  ASA: 3  Anesthesia Plan: General   Post-op Pain Management: Minimal or no pain anticipated   Induction:   PONV Risk Score and Plan: 1 and Ondansetron, Dexamethasone and Propofol infusion  Airway Management Planned: Oral ETT  Additional Equipment: None  Intra-op Plan:   Post-operative Plan: Extubation in OR  Informed Consent: I have reviewed the  patients History and Physical, chart, labs and discussed the procedure including the  risks, benefits and alternatives for the proposed anesthesia with the patient or authorized representative who has indicated his/her understanding and acceptance.     Dental advisory given  Plan Discussed with: CRNA and Anesthesiologist  Anesthesia Plan Comments: (PAT note written 10/17/2021 by Myra Gianotti, PA-C.  He had A1c, CBC with diff, CMP on 10/09/21 at [facility] (see Care Everywhere).  Results included A1c 7.3%, WBC 8.4, hemoglobin 14.8, hematocrit 43.2, platelet count 150, glucose 120, BUN 12, creatinine 1.14, sodium 142, potassium 4.7, albumin 4.5, total bili 0.4, alkaline phosphatase 83, AST 20, ALT 22.   )       Anesthesia Quick Evaluation

## 2021-10-17 NOTE — Progress Notes (Signed)
Anesthesia Chart Review: Daniel Ball  Case: [id number] Date/Time: 10/20/21 1100   Procedure: ROBOTIC ASSISTED NAVIGATIONAL BRONCHOSCOPY   Anesthesia type: General   Pre-op diagnosis: RIGHT LUNG NODULE   Location: [facility] 3 / [place] [facility]   Surgeons: Collene Gobble, MD       DISCUSSION: Patient is a 66 year old male scheduled for the above procedure.  He has presumed non-small cell right lung cancer s/p SBRT. Recent imaging revealed new pulmonary nodules with the above procedure recommended.   History includes smoking, COPD, presumed non-small cell right lung cancer (non-diagnostic bronchoscopies 04/09/20 & 07/30/20, but with atypical cells felt most c/w NSCLC & hypermetabolic on PET scan, s/p SBRT), exertional dyspnea, OSA (intolerant to CPAP),  HTN, HLD, DM2, CAD (s/p atherectomy/DES to proximal/mid LAD 01/18/20), atrial fibrillation (11/02/19), GERD, spinal surgery (L1-5 foraminotomies, excision of epidural lipoma 02/07/14).  History of asbestos exposure (stationed at [employer]).  Last cardiology visit with Dr. Harrington Challenger on 03/10/21. No convincing symptoms of active ischemic.  His dyspnea was stable (did not improve after 12/2019 PCI or after Imdur, which was discontinued). Medical therapy recommended. On Plavix and not ASA for CAD and on Eliquis for afib history. Follow-up around December or January planned, not scheduled yet.   He had A1c, CBC with diff, and CMP at [facility] on 10/09/21 (see LABS or Care Everywhere).  He is a same-day work-up so anesthesia team to evaluate on the day of surgery.  Verified with Dr. Lamonte Sakai that he advised holding Plavix for 5 days and Eliquis 2 days prior to procedure.    VS:  BP Readings from Last 3 Encounters:  09/26/21 128/82  08/21/21 (!) 143/75  07/28/21 136/73   Pulse Readings from Last 3 Encounters:  09/26/21 91  08/21/21 74  07/28/21 72     PROVIDERS: [name], MD is PCP  Baltazar Apo, MD is pulmonologist Gery Pray, MD is RAD-ONC   LABS: For day of surgery as indicated. He had A1c, CBC with diff, CMP on 10/09/21 at [facility] (see Care Everywhere).  Results included A1c 7.3%, WBC 8.4, hemoglobin 14.8, hematocrit 43.2, platelet count 150, glucose 120, BUN 12, creatinine 1.14, sodium 142, potassium 4.7, albumin 4.5, total bili 0.4, alkaline phosphatase 83, AST 20, ALT 22.   Sleep Study (Positive Airway Pressure Titration) 03/18/20: Results included: RESPIRATORY PARAMETERS The overall AHI was 25.0 per hour, with a central apnea index of 0.0 per hour. Optimal pressure was not achieved. The sleep efficiency was 69.7% % and the patient was supine for 6.59%. The arousal index was 20.4 per hour.The oxygen nadir was 82.0% during sleep. IMPRESSIONS - Inadequate CPAP titration. Patient expressed that he could not tolerate higher pressures. Highest EPR attempted was 2. BPAP was not attempted. RECOMMENDATIONS - BPAP titration recommended. Alternatively, a trial of Auto-BiPAP 6-18 cm H2O with pressure support of 4 cm H2O could be attempted. Patient did not tolerate CPAP.    PFTs 08/22/18:  FVC 2.94 (79%), post 2.98 (81%) FEV1 2.22 (80%), post 2.21 (80%) DLCO unc 14.66 (60%)   IMAGES: CT Super D Chest 10/13/21: IMPRESSION: 1. Slight increased size of small right lower lobe subpleural pulmonary nodule anteriorly. Other larger subpleural nodule in the base of the right lower lobe is grossly stable compared to the prior examination. No new pulmonary nodules or masses are otherwise noted. Chronic postradiation mass-like fibrosis in the right upper lobe near the apex is similar to the prior examination. 2. The appearance of the lungs remains  concerning for interstitial lung disease, likely asbestosis given the patient's history of asbestos exposure. 3. Diffuse bronchial wall thickening with mild paraseptal emphysema. 4. Aortic atherosclerosis, in addition to left main and three-vessel coronary artery disease.  Please note that although the presence of coronary artery calcium documents the presence of coronary artery disease, the severity of this disease and any potential stenosis cannot be assessed on this non-gated CT examination. Assessment for potential risk factor modification, dietary therapy or pharmacologic therapy may be warranted, if clinically indicated. 5. There are calcifications of the aortic valve. Echocardiographic correlation for evaluation of potential valvular dysfunction may be warranted if clinically indicated. 6. Additional incidental findings, as above. [See full report] - Aortic Atherosclerosis (ICD10-I70.0) and Emphysema (ICD10-J43.9).  PET Scan 08/07/21: IMPRESSION: 1. Hypermetabolic subpleural nodule along the medial right hemidiaphragm, worrisome for disease recurrence. A separate smaller nodule along the right major fissure is too small for PET resolution. 2. Pulmonary parenchymal coarsening, better evaluated on recent CT chest 07/24/2021. 3. Slight marginal irregularity liver raises suspicion for cirrhosis. 4. Aortic atherosclerosis (ICD10-I70.0). Coronary artery calcification.     EKG: 03/10/21: Normal sinus rhythm.  Nonspecific ST and T wave abnormality.   CV: Cath and PCI 01/18/20: Left Main  Vessel is large. Vessel is angiographically normal.  Dist LM to Ost LAD lesion is 40% stenosed. The lesion is eccentric. The lesion is severely calcified. Ultrasound (IVUS) was performed. Moderate plaque burden was detected. IVUS has determined that the lesion is calcified.    Left Anterior Descending  Prox LAD to Mid LAD lesion is 80% stenosed. The lesion is severely calcified. Ultrasound (IVUS) was performed. Severe plaque burden was detected. IVUS has determined that the lesion is calcified. DFR = 0.86.  Mid LAD lesion is 50% stenosed.    First Diagonal Branch  Vessel is small in size.    Second Diagonal Branch  Vessel is small in size.    Left Circumflex   Vessel is large.    First Obtuse Marginal Branch  Vessel is moderate in size.  1st Mrg lesion is 50% stenosed.    Second Obtuse Marginal Branch  Vessel is moderate in size.  2nd Mrg lesion is 30% stenosed.     Multivessel coronary artery disease, including 80% proximal LAD stenosis previously shown to be hemodynamically significant by DFR.  There is also moderate disease involving the distal LMCA/ostial LAD, mid/distal LAD, and OM1/OM2. Mildly elevated left ventricular filling pressure. Successful orbital atherectomy and IVUS guided PCI to the proximal/mid LAD using a Synergy 3.5 x 28 mm drug-eluting stent (postdilated to 4.1 mm) with 0% residual stenosis and TIMI-3 flow.   Recommendations: Overnight extended recovery. If no evidence of bleeding/vascular complications, anticipate restarting apixaban tomorrow.  Would continue apixaban and clopidogrel for at least 6 months, after which time patient could be transitioned to apixaban and aspirin 81 mg daily. Aggressive secondary prevention.  Fasting lipid panel to be checked with morning labs to see if rosuvastatin needs to be increased.    TTE 11/17/19:  1. Left ventricular ejection fraction, by estimation, is 60 to 65%. The  left ventricle has normal function. The left ventricle has no regional  wall motion abnormalities. There is mild concentric left ventricular  hypertrophy. Left ventricular diastolic  parameters are indeterminate.   2. Right ventricular systolic function is normal. The right ventricular  size is normal.   3. The mitral valve is grossly normal. Trivial mitral valve  regurgitation.   4. The aortic valve is tricuspid. Aortic  valve regurgitation is not  visualized. Mild aortic valve sclerosis is present, with no evidence of  aortic valve stenosis.   5. The inferior vena cava is normal in size with greater than 50%  respiratory variability, suggesting right atrial pressure of 3 mmHg.     [place] Abd Aorta  11/15/18: IMPRESSION: Limited examination without visualization of the proximal or distal abdominal aorta or bifurcation. The mid abdominal aorta is normal in caliber.   Past Medical History:  Diagnosis Date   Arthritis    Atrial fibrillation ([place]) 11/02/2019   COPD (chronic obstructive pulmonary disease) (HCC)    Coronary artery disease    a. 12/2019: s/p orbital atherectomy and DES placement to proximal/mid LAD.    Diabetes mellitus without complication ([place])    GERD (gastroesophageal reflux disease)    History of radiation therapy 09/11/20, 09/16/21, 09/18/20    Right lung- SBRT Dr. Sondra Come    Hyperlipidemia    Hypertension    hx HBP - MEDS DC'D 10 YRS since BP has been in normal range   Impingement syndrome of shoulder    left   Non-small cell cancer of right lung (HCC)    Shortness of breath    with exertion   Sleep apnea    couldnt tolerate CPAP   Spinal stenosis     Past Surgical History:  Procedure Laterality Date   BRONCHIAL BIOPSY  04/09/2020   Procedure: BRONCHIAL BIOPSIES;  Surgeon: Collene Gobble, MD;  Location: [place];  Service: Pulmonary;;   BRONCHIAL BRUSHINGS  04/09/2020   Procedure: BRONCHIAL BRUSHINGS;  Surgeon: Collene Gobble, MD;  Location: [place];  Service: Pulmonary;;   BRONCHIAL BRUSHINGS  07/30/2020   Procedure: BRONCHIAL BRUSHINGS;  Surgeon: Collene Gobble, MD;  Location: [place];  Service: Pulmonary;;   BRONCHIAL NEEDLE ASPIRATION BIOPSY  04/09/2020   Procedure: BRONCHIAL NEEDLE ASPIRATION BIOPSIES;  Surgeon: Collene Gobble, MD;  [facility]: [facility];  Service: Pulmonary;;   BRONCHIAL NEEDLE ASPIRATION BIOPSY  07/30/2020   Procedure: BRONCHIAL NEEDLE ASPIRATION BIOPSIES;  Surgeon: Collene Gobble, MD;  Location: [place];  Service: Pulmonary;;   BRONCHIAL WASHINGS  04/09/2020   Procedure: BRONCHIAL WASHINGS;  Surgeon: Collene Gobble, MD;  Location: [facility] ENDOSCOPY;  Service: Pulmonary;;   BRONCHIAL WASHINGS  07/30/2020    Procedure: BRONCHIAL WASHINGS;  Surgeon: Collene Gobble, MD;  Location: [place] ENDOSCOPY;  Service: Pulmonary;;   CORONARY ATHERECTOMY N/A 01/18/2020   Procedure: CORONARY ATHERECTOMY;  Surgeon: Nelva Bush, MD;  Location: [place] CV LAB;  Service: Cardiovascular;  Laterality: N/A;   CORONARY STENT INTERVENTION  01/18/2020   CORONARY STENT INTERVENTION N/A 01/18/2020   Procedure: CORONARY STENT INTERVENTION;  Surgeon: Nelva Bush, MD;  Location: [place] CV LAB;  Service: Cardiovascular;  Laterality: N/A;   EYE SURGERY Left    FIDUCIAL MARKER PLACEMENT  04/09/2020   Procedure: FIDUCIAL MARKER PLACEMENT;  Surgeon: Collene Gobble, MD;  Location: [facility] ENDOSCOPY;  Service: Pulmonary;;   FINE NEEDLE ASPIRATION  04/09/2020   Procedure: FINE NEEDLE ASPIRATION (FNA) LINEAR;  Surgeon: Collene Gobble, MD;  Location: [place] ENDOSCOPY;  Service: Pulmonary;;   HAND SURGERY Right    thumb   INTRAVASCULAR PRESSURE WIRE/FFR STUDY N/A 11/02/2019   Procedure: INTRAVASCULAR PRESSURE WIRE/FFR STUDY;  Surgeon: Nelva Bush, MD;  Location: [place] CV LAB;  Service: Cardiovascular;  Laterality: N/A;   INTRAVASCULAR ULTRASOUND/IVUS N/A 01/18/2020   Procedure: Intravascular Ultrasound/IVUS;  Surgeon: Nelva Bush, MD;  Location: [place] CV LAB;  Service: Cardiovascular;  Laterality: N/A;   LUMBAR LAMINECTOMY/DECOMPRESSION MICRODISCECTOMY N/A 02/07/2014   Procedure: LUMBAR DECOMPRESSION Lumbar one-Lumbar five;  Surgeon: [name], MD;  Location: [facility];  Service: Orthopedics;  Laterality: N/A;   RIGHT/LEFT HEART CATH AND CORONARY ANGIOGRAPHY N/A 11/02/2019   Procedure: RIGHT/LEFT HEART CATH AND CORONARY ANGIOGRAPHY;  Surgeon: Nelva Bush, MD;  Location: [place] CV LAB;  Service: Cardiovascular;  Laterality: N/A;   SHOULDER ARTHROSCOPY WITH SUBACROMIAL DECOMPRESSION Left 04/12/2014   Procedure: LEFT SHOULDER ARTHROSCOPY WITH SUBACROMIAL DECOMPRESSION AND LABRAL DEBRIDEMENT, ROTATOR CUFF  DEBRIDEMENT, BICEPS DEBRIDEMENT;  Surgeon: [name], MD;  Location: [facility];  Service: Orthopedics;  Laterality: Left;   VIDEO BRONCHOSCOPY WITH ENDOBRONCHIAL NAVIGATION N/A 04/09/2020   Procedure: VIDEO BRONCHOSCOPY WITH ENDOBRONCHIAL NAVIGATION;  Surgeon: Collene Gobble, MD;  Location: [place] ENDOSCOPY;  Service: Pulmonary;  Laterality: N/A;   VIDEO BRONCHOSCOPY WITH ENDOBRONCHIAL NAVIGATION Right 07/30/2020   Procedure: VIDEO BRONCHOSCOPY WITH ENDOBRONCHIAL NAVIGATION;  Surgeon: Collene Gobble, MD;  Location: [place] ENDOSCOPY;  Service: Pulmonary;  Laterality: Right;   VIDEO BRONCHOSCOPY WITH ENDOBRONCHIAL ULTRASOUND N/A 04/09/2020   Procedure: VIDEO BRONCHOSCOPY WITH ENDOBRONCHIAL ULTRASOUND;  Surgeon: Collene Gobble, MD;  Location: [facility] ENDOSCOPY;  Service: Pulmonary;  Laterality: N/A;   VIDEO BRONCHOSCOPY WITH RADIAL ENDOBRONCHIAL ULTRASOUND  04/09/2020   Procedure: VIDEO BRONCHOSCOPY WITH RADIAL ENDOBRONCHIAL ULTRASOUND;  Surgeon: Collene Gobble, MD;  Location: [facility] ENDOSCOPY;  Service: Pulmonary;;    MEDICATIONS: No current facility-administered medications for this encounter.    amLODipine (NORVASC) 5 MG tablet   buPROPion (WELLBUTRIN XL) 300 MG 24 hr tablet   clopidogrel (PLAVIX) 75 MG tablet   cyclobenzaprine (FLEXERIL) 10 MG tablet   ELIQUIS 5 MG TABS tablet   furosemide (LASIX) 40 MG tablet   isosorbide mononitrate (IMDUR) 60 MG 24 hr tablet   metFORMIN (GLUCOPHAGE) 500 MG tablet   metoprolol succinate (TOPROL-XL) 25 MG 24 hr tablet   nitroGLYCERIN (NITROSTAT) 0.4 MG SL tablet   pantoprazole (PROTONIX) 40 MG tablet   potassium chloride SA (KLOR-CON) 20 MEQ tablet   rosuvastatin (CRESTOR) 40 MG tablet   RYBELSUS 3 MG TABS   Tiotropium Bromide-Olodaterol (STIOLTO RESPIMAT) 2.5-2.5 MCG/ACT AERS   valsartan (DIOVAN) 320 MG tablet   albuterol (VENTOLIN HFA) 108 (90 Base) MCG/ACT inhaler   Tiotropium Bromide Monohydrate (SPIRIVA RESPIMAT) 2.5 MCG/ACT AERS    Myra Gianotti,  PA-C Surgical Short Stay/Anesthesiology [facility] Phone [phone] [facility] Phone [phone] 10/17/2021 1:57 PM

## 2021-10-20 ENCOUNTER — Ambulatory Visit (HOSPITAL_COMMUNITY): Payer: Medicare Other

## 2021-10-20 ENCOUNTER — Ambulatory Visit (HOSPITAL_COMMUNITY): Payer: Medicare Other | Admitting: Vascular Surgery

## 2021-10-20 ENCOUNTER — Encounter (HOSPITAL_COMMUNITY)
Admission: RE | Disposition: A | Discharge status: Home / Self Care | Payer: Self-pay | Source: Home / Self Care | Attending: Emergency Medicine

## 2021-10-20 ENCOUNTER — Encounter: Payer: Self-pay | Admitting: Emergency Medicine

## 2021-10-20 ENCOUNTER — Ambulatory Visit (HOSPITAL_COMMUNITY)
Admission: RE | Admit: 2021-10-20 | Discharge: 2021-10-20 | Disposition: A | Discharge status: Home / Self Care | Payer: Medicare Other | Attending: Emergency Medicine | Admitting: Emergency Medicine

## 2021-10-20 ENCOUNTER — Other Ambulatory Visit: Payer: Self-pay

## 2021-10-20 ENCOUNTER — Encounter (HOSPITAL_COMMUNITY): Payer: Self-pay | Admitting: Emergency Medicine

## 2021-10-20 DIAGNOSIS — I10 Essential (primary) hypertension: Secondary | ICD-10-CM | POA: Insufficient documentation

## 2021-10-20 DIAGNOSIS — E119 Type 2 diabetes mellitus without complications: Secondary | ICD-10-CM | POA: Diagnosis not present

## 2021-10-20 DIAGNOSIS — I2510 Atherosclerotic heart disease of native coronary artery without angina pectoris: Secondary | ICD-10-CM | POA: Diagnosis not present

## 2021-10-20 DIAGNOSIS — Z20822 Contact with and (suspected) exposure to covid-19: Secondary | ICD-10-CM | POA: Diagnosis not present

## 2021-10-20 DIAGNOSIS — R911 Solitary pulmonary nodule: Secondary | ICD-10-CM | POA: Insufficient documentation

## 2021-10-20 DIAGNOSIS — Z6841 Body Mass Index (BMI) 40.0 and over, adult: Secondary | ICD-10-CM | POA: Insufficient documentation

## 2021-10-20 DIAGNOSIS — J849 Interstitial pulmonary disease, unspecified: Secondary | ICD-10-CM | POA: Diagnosis not present

## 2021-10-20 DIAGNOSIS — Z77090 Contact with and (suspected) exposure to asbestos: Secondary | ICD-10-CM | POA: Diagnosis not present

## 2021-10-20 DIAGNOSIS — K219 Gastro-esophageal reflux disease without esophagitis: Secondary | ICD-10-CM | POA: Insufficient documentation

## 2021-10-20 DIAGNOSIS — R918 Other nonspecific abnormal finding of lung field: Secondary | ICD-10-CM | POA: Diagnosis not present

## 2021-10-20 DIAGNOSIS — Z955 Presence of coronary angioplasty implant and graft: Secondary | ICD-10-CM | POA: Insufficient documentation

## 2021-10-20 DIAGNOSIS — G4733 Obstructive sleep apnea (adult) (pediatric): Secondary | ICD-10-CM | POA: Insufficient documentation

## 2021-10-20 DIAGNOSIS — Z419 Encounter for procedure for purposes other than remedying health state, unspecified: Secondary | ICD-10-CM

## 2021-10-20 DIAGNOSIS — J449 Chronic obstructive pulmonary disease, unspecified: Secondary | ICD-10-CM | POA: Insufficient documentation

## 2021-10-20 DIAGNOSIS — Z87891 Personal history of nicotine dependence: Secondary | ICD-10-CM | POA: Diagnosis not present

## 2021-10-20 DIAGNOSIS — Z9889 Other specified postprocedural states: Secondary | ICD-10-CM

## 2021-10-20 DIAGNOSIS — K449 Diaphragmatic hernia without obstruction or gangrene: Secondary | ICD-10-CM | POA: Diagnosis not present

## 2021-10-20 DIAGNOSIS — R896 Abnormal cytological findings in specimens from other organs, systems and tissues: Secondary | ICD-10-CM | POA: Diagnosis not present

## 2021-10-20 HISTORY — PX: FIDUCIAL MARKER PLACEMENT: SHX6858

## 2021-10-20 HISTORY — PX: BRONCHIAL BIOPSY: SHX5109

## 2021-10-20 HISTORY — PX: BRONCHIAL WASHINGS: SHX5105

## 2021-10-20 HISTORY — PX: VIDEO BRONCHOSCOPY WITH RADIAL ENDOBRONCHIAL ULTRASOUND: SHX6849

## 2021-10-20 HISTORY — PX: BRONCHIAL NEEDLE ASPIRATION BIOPSY: SHX5106

## 2021-10-20 HISTORY — PX: BRONCHIAL BRUSHINGS: SHX5108

## 2021-10-20 LAB — GLUCOSE, CAPILLARY
Glucose-Capillary: 132 mg/dL — ABNORMAL HIGH (ref 70–99)
Glucose-Capillary: 117 mg/dL — ABNORMAL HIGH (ref 70–99)
Glucose-Capillary: 144 mg/dL — ABNORMAL HIGH (ref 70–99)

## 2021-10-20 LAB — SARS CORONAVIRUS 2 BY RT PCR (HOSPITAL ORDER, PERFORMED IN ~~LOC~~ HOSPITAL LAB): SARS Coronavirus 2: NEGATIVE

## 2021-10-20 SURGERY — BRONCHOSCOPY, WITH BIOPSY USING ELECTROMAGNETIC NAVIGATION
Anesthesia: General

## 2021-10-20 MED ORDER — PROPOFOL 500 MG/50ML IV EMUL
INTRAVENOUS | Status: DC | PRN
  Administered 2021-10-20: 100 ug/kg/min via INTRAVENOUS

## 2021-10-20 MED ORDER — METOPROLOL SUCCINATE ER 25 MG PO TB24
ORAL_TABLET | ORAL | Status: AC
  Administered 2021-10-20: 25 mg via ORAL
  Filled 2021-10-20: qty 1

## 2021-10-20 MED ORDER — PHENYLEPHRINE HCL-NACL 20-0.9 MG/250ML-% IV SOLN
INTRAVENOUS | Status: DC | PRN
  Administered 2021-10-20: 50 ug/min via INTRAVENOUS

## 2021-10-20 MED ORDER — PHENYLEPHRINE 40 MCG/ML (10ML) SYRINGE FOR IV PUSH (FOR BLOOD PRESSURE SUPPORT)
PREFILLED_SYRINGE | INTRAVENOUS | Status: DC | PRN
  Administered 2021-10-20 (×2): 80 ug via INTRAVENOUS

## 2021-10-20 MED ORDER — APIXABAN 5 MG PO TABS: 5.0000 mg | ORAL_TABLET | Freq: Two times a day (BID) | ORAL | 6 refills | Status: DC

## 2021-10-20 MED ORDER — LACTATED RINGERS IV SOLN: INTRAVENOUS | Status: DC

## 2021-10-20 MED ORDER — ROCURONIUM BROMIDE 10 MG/ML (PF) SYRINGE
PREFILLED_SYRINGE | INTRAVENOUS | Status: DC | PRN
  Administered 2021-10-20: 50 mg via INTRAVENOUS
  Administered 2021-10-20: 20 mg via INTRAVENOUS

## 2021-10-20 MED ORDER — ONDANSETRON HCL 4 MG/2ML IJ SOLN
INTRAMUSCULAR | Status: DC | PRN
  Administered 2021-10-20: 4 mg via INTRAVENOUS

## 2021-10-20 MED ORDER — DEXAMETHASONE SODIUM PHOSPHATE 4 MG/ML IJ SOLN
INTRAMUSCULAR | Status: DC | PRN
  Administered 2021-10-20: 4 mg via INTRAVENOUS

## 2021-10-20 MED ORDER — METOPROLOL SUCCINATE ER 25 MG PO TB24: 25.0000 mg | ORAL_TABLET | Freq: Once | ORAL | Status: AC

## 2021-10-20 MED ORDER — CHLORHEXIDINE GLUCONATE 0.12 % MT SOLN
OROMUCOSAL | Status: AC
  Administered 2021-10-20: 15 mL
  Filled 2021-10-20: qty 15

## 2021-10-20 MED ORDER — PROPOFOL 10 MG/ML IV BOLUS
INTRAVENOUS | Status: DC | PRN
  Administered 2021-10-20: 200 mg via INTRAVENOUS

## 2021-10-20 MED ORDER — MIDAZOLAM HCL 5 MG/5ML IJ SOLN
INTRAMUSCULAR | Status: DC | PRN
  Administered 2021-10-20: 2 mg via INTRAVENOUS

## 2021-10-20 MED ORDER — FENTANYL CITRATE (PF) 100 MCG/2ML IJ SOLN
INTRAMUSCULAR | Status: DC | PRN
  Administered 2021-10-20: 50 ug via INTRAVENOUS

## 2021-10-20 MED ORDER — SUCCINYLCHOLINE CHLORIDE 200 MG/10ML IV SOSY
PREFILLED_SYRINGE | INTRAVENOUS | Status: DC | PRN
  Administered 2021-10-20: 160 mg via INTRAVENOUS

## 2021-10-20 MED ORDER — CLOPIDOGREL BISULFATE 75 MG PO TABS: 75.0000 mg | ORAL_TABLET | Freq: Every day | ORAL | 1 refills | Status: DC

## 2021-10-20 SURGICAL SUPPLY — 1 items: Superlock Fiducial marker ×1 IMPLANT

## 2021-10-20 NOTE — Interval H&P Note (Signed)
History and Physical Interval Note:  10/20/2021 10:02 AM  Daniel Ball  has presented today for surgery, with the diagnosis of RIGHT LUNG NODULE.  The various methods of treatment have been discussed with the patient and family. After consideration of risks, benefits and other options for treatment, the patient has consented to  Procedure(s): ROBOTIC ASSISTED NAVIGATIONAL BRONCHOSCOPY (N/A) as a surgical intervention.  The patient's history has been reviewed, patient examined, no change in status, stable for surgery.  I have reviewed the patient's chart and labs.  Questions were answered to the patient's satisfaction.     Collene Gobble

## 2021-10-20 NOTE — Discharge Instructions (Signed)
Flexible Bronchoscopy, Care After This sheet gives you information about how to care for yourself after your test. Your doctor may also give you more specific instructions. If you have problems or questions, contact your doctor. Follow these instructions at home: Eating and drinking Do not eat or drink anything (not even water) for 2 hours after your test, or until your numbing medicine (local anesthetic) wears off. When your numbness is gone and your cough and gag reflexes have come back, you may: Eat only soft foods. Slowly drink liquids. The day after the test, go back to your normal diet. Driving Do not drive for 24 hours if you were given a medicine to help you relax (sedative). Do not drive or use heavy machinery while taking prescription pain medicine. General instructions  Take over-the-counter and prescription medicines only as told by your doctor. Return to your normal activities as told. Ask what activities are safe for you. Do not use any products that have nicotine or tobacco in them. This includes cigarettes and e-cigarettes. If you need help quitting, ask your doctor. Keep all follow-up visits as told by your doctor. This is important. It is very important if you had a tissue sample (biopsy) taken. Get help right away if: You have shortness of breath that gets worse. You get light-headed. You feel like you are going to pass out (faint). You have chest pain. You cough up: More than a little blood. More blood than before. Summary Do not eat or drink anything (not even water) for 2 hours after your test, or until your numbing medicine wears off. Do not use cigarettes. Do not use e-cigarettes. Get help right away if you have chest pain.  Please call our office for any questions or concerns.  [phone].   This information is not intended to replace advice given to you by your health care provider. Make sure you discuss any questions you have with your health care  provider. Document Released: 07/05/2009 Document Revised: 08/20/2017 Document Reviewed: 09/25/2016 Elsevier Patient Education  2020 [employer].

## 2021-10-20 NOTE — Anesthesia Procedure Notes (Signed)
Procedure Name: Intubation Date/Time: 10/20/2021 11:35 AM Performed by: Lieutenant Diego, CRNA Pre-anesthesia Checklist: Patient identified, Emergency Drugs available, Suction available and Patient being monitored Patient Re-evaluated:Patient Re-evaluated prior to induction Oxygen Delivery Method: Circle system utilized Preoxygenation: Pre-oxygenation with 100% oxygen Induction Type: IV induction Ventilation: Mask ventilation without difficulty Laryngoscope Size: Miller and 2 Grade View: Grade I Tube type: Oral Tube size: 8.5 mm Number of attempts: 1 Airway Equipment and Method: Stylet Placement Confirmation: ETT inserted through vocal cords under direct vision, positive ETCO2 and breath sounds checked- equal and bilateral Secured at: 24 cm Tube secured with: Tape Dental Injury: Teeth and Oropharynx as per pre-operative assessment

## 2021-10-20 NOTE — Transfer of Care (Signed)
Immediate Anesthesia Transfer of Care Note  Patient: Daniel Ball  Procedure(s) Performed: ROBOTIC ASSISTED NAVIGATIONAL BRONCHOSCOPY VIDEO BRONCHOSCOPY WITH RADIAL ENDOBRONCHIAL ULTRASOUND BRONCHIAL BIOPSIES BRONCHIAL BRUSHINGS BRONCHIAL NEEDLE ASPIRATION BIOPSIES FIDUCIAL MARKER PLACEMENT BRONCHIAL WASHINGS  Patient Location: PACU  Anesthesia Type:General  Level of Consciousness: awake  Airway & Oxygen Therapy: Patient Spontanous Breathing and Patient connected to face mask oxygen  Post-op Assessment: Report given to RN and Post -op Vital signs reviewed and stable  Post vital signs: Reviewed and stable  Last Vitals:  Vitals Value Taken Time  BP    Temp    Pulse 80 10/20/21 1255  Resp 21 10/20/21 1255  SpO2 98 % 10/20/21 1255  Vitals shown include unvalidated device data.  Last Pain:  Vitals:   10/20/21 0804  TempSrc: Oral         Complications: No notable events documented.

## 2021-10-20 NOTE — Op Note (Signed)
Video Bronchoscopy with Robotic Assisted Bronchoscopic Navigation   Date of Operation: 10/20/2021   Pre-op Diagnosis: Right lower lobe nodule  Post-op Diagnosis: Same  Surgeon: Baltazar Apo  Assistants: None  Anesthesia: General endotracheal anesthesia  Operation: Flexible video fiberoptic bronchoscopy with robotic assistance and biopsies.  Estimated Blood Loss: Minimal  Complications: None  Indications and History: Daniel Ball is a 66 y.o. male with history of tobacco use, asbestos exposure, prior non-small cell lung cancer post SBRT.  Recommendation made to achieve tissue diagnosis via robotic assisted navigational bronchoscopy.  Found to have a newly right basilar pulmonary nodule at the right hemidiaphragm. The risks, benefits, complications, treatment options and expected outcomes were discussed with the patient.  The possibilities of pneumothorax, pneumonia, reaction to medication, pulmonary aspiration, perforation of a viscus, bleeding, failure to diagnose a condition and creating a complication requiring transfusion or operation were discussed with the patient who freely signed the consent.    Description of Procedure: The patient was seen in the Preoperative Area, was examined and was deemed appropriate to proceed.  The patient was taken to [facility] endoscopy room 3, identified as Daniel Ball and the procedure verified as Flexible Video Fiberoptic Bronchoscopy.  A Time Out was held and the above information confirmed.   Prior to the date of the procedure a high-resolution CT scan of the chest was performed. Utilizing ION software program a virtual tracheobronchial tree was generated to allow the creation of distinct navigation pathways to the patient's parenchymal abnormalities. After being taken to the operating room general anesthesia was initiated and the patient  was orally intubated. The video fiberoptic bronchoscope was introduced via the endotracheal tube and a general  inspection was performed which showed normal right and left lung anatomy, aspiration of the bilateral mainstems was completed to remove any remaining secretions. Robotic catheter inserted into patient's endotracheal tube.   Target #1 right lower lobe pulmonary nodule: The distinct navigation pathways prepared prior to this procedure were then utilized to navigate to patient's lesion identified on CT scan. The robotic catheter was secured into place and the vision probe was withdrawn.  Lesion location was approximated using fluoroscopy and radial endobronchial ultrasound for peripheral targeting.  Local registration was performed using Cios three-dimensional imaging.  Under fluoroscopic guidance transbronchial brushings, transbronchial needle biopsies, and transbronchial forceps biopsies were performed to be sent for cytology and pathology. A bronchioalveolar lavage was performed in the right lower lobe and sent for cytology.  Three-dimensional imaging confirmed that the needle was in the lesion of interest.  Under fluoroscopic guidance a single fiducial marker was placed adjacent to the nodule.   At the end of the procedure a general airway inspection was performed and there was no evidence of active bleeding. The bronchoscope was removed.  The patient tolerated the procedure well. There was no significant blood loss and there were no obvious complications. A post-procedural chest x-ray is pending.  Samples Target #1: 1. Transbronchial brushings from right lower lobe nodule 2. Transbronchial Wang needle biopsies from right lower lobe nodule 3. Transbronchial forceps biopsies from right lower lobe nodule 4. Bronchoalveolar lavage from right lower lobe  Plans:  The patient will be discharged from the PACU to home when recovered from anesthesia and after chest x-ray is reviewed. We will review the cytology, pathology and microbiology results with the patient when they become available. Outpatient  followup will be with Dr. Lamonte Sakai and Dr Sondra Come.    Baltazar Apo, MD, PhD 10/20/2021, 12:48 PM [name] [phone] or if no answer before 7:00PM call [phone] For any issues after 7:00PM please call eLink [phone]

## 2021-10-20 NOTE — Telephone Encounter (Signed)
Dr. Lamonte Sakai, this message was received this afternoon for you.   Message from patient-  The cost of this medication is $747 for a 90 supply. Can I use a alternative drug name Toni Amend which is $140 for a 90 day supply?

## 2021-10-21 ENCOUNTER — Encounter (HOSPITAL_COMMUNITY): Payer: Self-pay | Admitting: Emergency Medicine

## 2021-10-21 LAB — CYTOLOGY - NON PAP

## 2021-10-21 NOTE — Telephone Encounter (Signed)
Yes, agree with trying the anoro to see how he does with it.

## 2021-10-22 ENCOUNTER — Telehealth: Payer: Self-pay | Admitting: Emergency Medicine

## 2021-10-22 MED ORDER — ANORO ELLIPTA 62.5-25 MCG/ACT IN AEPB: 1.0000 | INHALATION_SPRAY | Freq: Every day | RESPIRATORY_TRACT | 3 refills | Status: DC

## 2021-10-22 NOTE — Telephone Encounter (Signed)
Reviewed bronchoscopy results with Mr. Mcquillen by phone.  His right lower lobe nodule transbronchial biopsies show atypical cells but not enough material to be fully diagnostic for malignancy.  I suspect that this represents a non-small cell lung cancer.  I did leave a fiducial marker in place to allow SBRT.  The smaller right midlung nodule was not sampled and will need to be followed.  We will send this message FYI to Dr. Sondra Come

## 2021-10-23 NOTE — Anesthesia Postprocedure Evaluation (Signed)
Anesthesia Post Note  Patient: MARINO ROGERSON  Procedure(s) Performed: ROBOTIC ASSISTED NAVIGATIONAL BRONCHOSCOPY VIDEO BRONCHOSCOPY WITH RADIAL ENDOBRONCHIAL ULTRASOUND BRONCHIAL BIOPSIES BRONCHIAL BRUSHINGS BRONCHIAL NEEDLE ASPIRATION BIOPSIES FIDUCIAL MARKER PLACEMENT BRONCHIAL WASHINGS     Patient location during evaluation: PACU Anesthesia Type: General Level of consciousness: awake and alert Pain management: pain level controlled Vital Signs Assessment: post-procedure vital signs reviewed and stable Respiratory status: spontaneous breathing, nonlabored ventilation, respiratory function stable and patient connected to nasal cannula oxygen Cardiovascular status: blood pressure returned to baseline and stable Postop Assessment: no apparent nausea or vomiting Anesthetic complications: no   No notable events documented.  Last Vitals:  Vitals:   10/20/21 1325 10/20/21 1340  BP: 117/73 114/81  Pulse: 74 82  Resp: 11 16  Temp:  36.6 C  SpO2: 97% 95%    Last Pain:  Vitals:   10/20/21 1340  TempSrc:   PainSc: 0-No pain                 Elfreida Heggs

## 2021-11-03 DIAGNOSIS — Z20822 Contact with and (suspected) exposure to covid-19: Secondary | ICD-10-CM | POA: Diagnosis not present

## 2021-11-04 ENCOUNTER — Encounter: Payer: Self-pay | Admitting: Emergency Medicine

## 2021-11-04 ENCOUNTER — Ambulatory Visit (INDEPENDENT_AMBULATORY_CARE_PROVIDER_SITE_OTHER): Payer: Medicare Other | Admitting: Emergency Medicine

## 2021-11-04 ENCOUNTER — Other Ambulatory Visit: Payer: Self-pay

## 2021-11-04 VITALS — BP 120/76 | HR 69 | Temp 97.6°F | Ht 64.0 in | Wt 253.2 lb

## 2021-11-04 DIAGNOSIS — G4733 Obstructive sleep apnea (adult) (pediatric): Secondary | ICD-10-CM | POA: Diagnosis not present

## 2021-11-04 DIAGNOSIS — J449 Chronic obstructive pulmonary disease, unspecified: Secondary | ICD-10-CM | POA: Diagnosis not present

## 2021-11-04 DIAGNOSIS — R918 Other nonspecific abnormal finding of lung field: Secondary | ICD-10-CM | POA: Diagnosis not present

## 2021-11-04 MED ORDER — ALBUTEROL SULFATE HFA 108 (90 BASE) MCG/ACT IN AERS
2.0000 | INHALATION_SPRAY | Freq: Four times a day (QID) | RESPIRATORY_TRACT | 3 refills | Status: AC | PRN
Start: 2021-11-04 — End: ?

## 2021-11-04 NOTE — Assessment & Plan Note (Signed)
Continue BiPAP

## 2021-11-04 NOTE — Assessment & Plan Note (Signed)
Has been seen by Dr. Sondra Come for SBRT in the past.  Now with a new right lower lobe nodule with atypical cells on biopsy.  Needs repeat SBRT if possible.  He would like to see another provider, is concerned that there were some delays in his care over the last several months.  I will make the new referral.

## 2021-11-04 NOTE — Addendum Note (Signed)
Addended by: Gavin Potters R on: 11/04/2021 09:24 AM   Modules accepted: Orders

## 2021-11-04 NOTE — Progress Notes (Signed)
Subjective:    Patient ID: Daniel Ball, male    DOB: 1955/11/25, 66 y.o.   MRN: [id number]  HPI  ROV 09/05/20 --66 year old gentleman, history of tobacco with NSIP/ILD, COPD, OSA.  We have been evaluating an enlarging right upper lobe peripheral nodule, positive on PET scan.  Initial brushings were negative but subsequent sampling 07/30/2020 showed atypical cells.  All consistent with non-small cell lung cancer.  He is planning for stereotactic radiation with Dr. Sondra Come, planning to start.  We tried starting Spiriva at his last visit to see if he would get benefit - no real change. He does use ventolin prn, often in the am to help clear some mucous. He is on BiPAP and is getting good benefit from it. Good compliance.   ROV 09/26/21 --66 year old gentleman with a history of tobacco use, COPD, interstitial disease (NSIP), OSA.  Also with presumed non-small cell lung cancer in the right upper lobe (positive PET, atypical cells on transbronchial brushings).  He underwent SBRT with Dr. Sondra Come December 2021.  He underwent surveillance CT chest and PET scan that show evidence for suspected metastatic disease in the right lower lobe as below.  He presents for discussion regarding possible navigational bronchoscopy, fiducial placement. Current bronchodilator regimen Spiriva but he doesn't use regularly, uses albuterol occasionally. Again unsure whether it is helping him. He has exertional SOB, especially when doing yard chores.  He has been exposed to asbestos, was stationed at [employer]  Smoking 10 cig a day  CT chest 07/24/2021 reviewed by me shows some radiation fibrosis adjacent to the right upper lobe treated nodule.  There is a new anterior right lower lobe nodule that abuts the major fissure 0.7 cm.  Significant enlargement of a subpleural nodule abutting the right hemidiaphragm 1.6 x 0.8 cm.  Diffuse bilateral bronchial wall thickening.  No mediastinal or hilar lymphadenopathy.  PET scan  08/07/2021 shows no evidence of mediastinal or hilar hypermetabolism the subpleural pulmonary nodule is hypermetabolic.  A separate 6 mm nodule along the right major fissure is too small for PET resolution.  ROV 11/04/21 --66 year old woman with a history of COPD, NSIP, OSA, SBRT for right upper lobe non-small cell lung cancer.  He underwent navigational bronchoscopy 10/20/2021 for new anterior right lower lobe nodule, significant enlargement of subpleural nodule abutting the right hemidiaphragm 1.6 x 0.8 cm.  There was a separate 6 mm right major fissure nodule that was too small for PET evaluation.  Pathology on the right lower lobe nodule showed clusters of atypical cells on biopsies and washing consistent with recurrence.  We have tried him on Stiolto, currently on Anoro and likes it. Does not have an albuterol. Daily cough, thick white hard to clear.    Review of Systems As per HPI     Objective:   Physical Exam Vitals:   11/04/21 0858  BP: 120/76  Pulse: 69  Temp: 97.6 F (36.4 C)  TempSrc: Oral  SpO2: 98%  Weight: 253 lb 3.2 oz (114.9 kg)  Height: 5\' 4"  (1.626 m)    Gen: Pleasant, obese, in no distress,  normal affect  ENT: No lesions,  mouth clear,  oropharynx clear, no postnasal drip  Neck: No JVD, no stridor  Lungs: No use of accessory muscles, no crackles or wheezing on normal respiration, no wheeze on forced expiration  Cardiovascular: RRR, heart sounds normal, no murmur or gallops, no peripheral edema  Musculoskeletal: No deformities, no cyanosis or clubbing  Neuro: alert, awake, non focal  Skin: Warm, no lesions or rash      Assessment & Plan:  Pulmonary nodules Has been seen by Dr. Sondra Come for SBRT in the past.  Now with a new right lower lobe nodule with atypical cells on biopsy.  Needs repeat SBRT if possible.  He would like to see another provider, is concerned that there were some delays in his care over the last several months.  I will make the new  referral.  COPD 0/ still smoking  Continue Anoro Albuterol as needed (needs a new prescription) Start Mucinex Smoking cessation  OSA (obstructive sleep apnea) Continue BiPAP  Time spent 44 minutes  Baltazar Apo, MD, PhD 11/04/2021, 9:21 AM [facility] [phone] or if no answer [phone]

## 2021-11-04 NOTE — Patient Instructions (Signed)
We reviewed your biopsy results today.  We will refer you back to radiation oncology for possible treatment of your new right lower lobe nodule Please continue Anoro 1 inhalation once daily. We will refill albuterol.  Use 2 puffs up to every 4 hours if needed for shortness of breath, chest tightness, wheezing, mucus clearance Try using guaifenesin 600 mg daily to loosen mucus and clear it more easily.  Drink lots of water. Follow with Dr Lamonte Sakai in 6 months or sooner if you have any problems

## 2021-11-04 NOTE — Assessment & Plan Note (Signed)
Continue Anoro Albuterol as needed (needs a new prescription) Start Mucinex Smoking cessation

## 2021-11-10 NOTE — Progress Notes (Signed)
Thoracic Location of Tumor / Histology: Right Lower Lobe Lung  Patient presented for follow-up scans following radiation therapy to his RUL treated in December 2021.  He was noted to have a subpleural right hemidiaphragmatic nodule measuring 1.6 cm (previously 6 mm), and a new 7 mm RLL nodule abutting the fissure.  Bronchoscopy 10/20/2021:  PET 93/81/0175: Hypermetabolic subpleural nodule along the medial right hemidiaphragm, worrisome for disease recurrence. A separate smaller nodule along the right major fissure is too small for PET resolution.   Biopsies of RLL 10/20/2021       Tobacco/Marijuana/Snuff/ETOH use: Current Smoker, 10 cigarettes per day.  Past/Anticipated interventions by cardiothoracic surgery, if any:   Past/Anticipated interventions by medical oncology, if any:     Signs/Symptoms Weight changes, if any: No Respiratory complaints, if any: Has SOB, states it's no better or worse. Hemoptysis, if any: Occasional productive cough with thick white sputum, other times brownish/green sputum. Pain issues, if any:  No  SAFETY ISSUES: Prior radiation? 09/11/2020 through 09/18/2020 The target in the RUL received a total of 54 Gy delivered at 18 Gy per fraction for a total of 3 fractions. 6XFFF beam energy with Dr. Sondra Come. Pacemaker/ICD? No  Possible current pregnancy? N/a Is the patient on methotrexate? No  Current Complaints / other details:

## 2021-11-10 NOTE — Progress Notes (Signed)
Radiation Oncology         [phone] ________________________________  Name: Daniel Ball        MRN: [id number]  Date of Service: 11/11/2021 DOB: 1956-01-13  CC:[name], MD  Collene Gobble, MD     REFERRING PHYSICIAN: Collene Gobble, MD   DIAGNOSIS: The encounter diagnosis was Nodule of lower lobe of right lung.   HISTORY OF PRESENT ILLNESS: Daniel Ball is a 66 y.o. male seen at the request of Dr. Lamonte Sakai for a history of Putative Stage IA1, cT1aN0M0, NSCLC of the RUL which he was treated with Dr. Sondra Come in December 2021. He has been followed as growth of a subpleural right hemidiaphragmatic nodule measuring 1.6 cm (previously 6 mm), and a new 7 mm RLL nodule abutting the fissure. He underwent PET scan on 08/07/21. This showed hypermetabolic change in the subpleural nodule along the right hemidiaphragm and the smaller lesion along the major fissure was too small for PET detection. He proceeded with bronchoscopy on 10/20/2021 which showed atypical cells in the right lower lobe FN.  Mark brushings of that site were negative.  Separately right lower lobe lavage showed atypical cells.    PREVIOUS RADIATION THERAPY:   09/11/2020 through 09/18/2020 The target in the RUL received a total of 54 Gy delivered at 18 Gy per fraction for a total of 3 fractions. 6XFFF beam energy with Dr. Sondra Come.    PAST MEDICAL HISTORY:  Past Medical History:  Diagnosis Date   Arthritis    Atrial fibrillation ([place]) 11/02/2019   COPD (chronic obstructive pulmonary disease) (HCC)    Coronary artery disease    a. 12/2019: s/p orbital atherectomy and DES placement to proximal/mid LAD.    Diabetes mellitus without complication ([place])    GERD (gastroesophageal reflux disease)    History of radiation therapy 09/11/20, 09/16/21, 09/18/20    Right lung- SBRT Dr. Sondra Come    Hyperlipidemia    Hypertension    hx HBP - MEDS DC'D 10 YRS since BP has been in normal range   Impingement syndrome of  shoulder    left   Non-small cell cancer of right lung (HCC)    Shortness of breath    with exertion   Sleep apnea    uses a cpap   Spinal stenosis        PAST SURGICAL HISTORY: Past Surgical History:  Procedure Laterality Date   BRONCHIAL BIOPSY  04/09/2020   Procedure: BRONCHIAL BIOPSIES;  Surgeon: Collene Gobble, MD;  Location: [place];  Service: Pulmonary;;   BRONCHIAL BIOPSY  10/20/2021   Procedure: BRONCHIAL BIOPSIES;  Surgeon: Collene Gobble, MD;  Location: [place];  Service: Pulmonary;;   BRONCHIAL BRUSHINGS  04/09/2020   Procedure: BRONCHIAL BRUSHINGS;  Surgeon: Collene Gobble, MD;  Location: [place];  Service: Pulmonary;;   BRONCHIAL BRUSHINGS  07/30/2020   Procedure: BRONCHIAL BRUSHINGS;  Surgeon: Collene Gobble, MD;  Location: [facility] ENDOSCOPY;  Service: Pulmonary;;   BRONCHIAL BRUSHINGS  10/20/2021   Procedure: BRONCHIAL BRUSHINGS;  Surgeon: Collene Gobble, MD;  Location: [facility] ENDOSCOPY;  Service: Pulmonary;;   BRONCHIAL NEEDLE ASPIRATION BIOPSY  04/09/2020   Procedure: BRONCHIAL NEEDLE ASPIRATION BIOPSIES;  Surgeon: Collene Gobble, MD;  Location: [place];  Service: Pulmonary;;   BRONCHIAL NEEDLE ASPIRATION BIOPSY  07/30/2020   Procedure: BRONCHIAL NEEDLE ASPIRATION BIOPSIES;  Surgeon: Collene Gobble, MD;  Location: [place] ENDOSCOPY;  Service: Pulmonary;;   BRONCHIAL NEEDLE ASPIRATION BIOPSY  10/20/2021  Procedure: BRONCHIAL NEEDLE ASPIRATION BIOPSIES;  Surgeon: Collene Gobble, MD;  Location: [facility] ENDOSCOPY;  Service: Pulmonary;;   BRONCHIAL WASHINGS  04/09/2020   Procedure: BRONCHIAL WASHINGS;  Surgeon: Collene Gobble, MD;  Location: [place];  Service: Pulmonary;;   BRONCHIAL WASHINGS  07/30/2020   Procedure: BRONCHIAL WASHINGS;  Surgeon: Collene Gobble, MD;  Location: [place];  Service: Pulmonary;;   BRONCHIAL WASHINGS  10/20/2021   Procedure: BRONCHIAL WASHINGS;  Surgeon: Collene Gobble, MD;  Location: [facility] ENDOSCOPY;  Service: Pulmonary;;    CORONARY ATHERECTOMY N/A 01/18/2020   Procedure: CORONARY ATHERECTOMY;  Surgeon: Nelva Bush, MD;  Location: [place] CV LAB;  Service: Cardiovascular;  Laterality: N/A;   CORONARY STENT INTERVENTION  01/18/2020   CORONARY STENT INTERVENTION N/A 01/18/2020   Procedure: CORONARY STENT INTERVENTION;  Surgeon: Nelva Bush, MD;  Location: [place] CV LAB;  Service: Cardiovascular;  Laterality: N/A;   EYE SURGERY Left    FIDUCIAL MARKER PLACEMENT  04/09/2020   Procedure: FIDUCIAL MARKER PLACEMENT;  Surgeon: Collene Gobble, MD;  Location: [place];  Service: Pulmonary;;   FIDUCIAL MARKER PLACEMENT  10/20/2021   Procedure: FIDUCIAL MARKER PLACEMENT;  Surgeon: Collene Gobble, MD;  Location: [facility] ENDOSCOPY;  Service: Pulmonary;;   FINE NEEDLE ASPIRATION  04/09/2020   Procedure: FINE NEEDLE ASPIRATION (FNA) LINEAR;  Surgeon: Collene Gobble, MD;  Location: [place] ENDOSCOPY;  Service: Pulmonary;;   HAND SURGERY Right    thumb   INTRAVASCULAR PRESSURE WIRE/FFR STUDY N/A 11/02/2019   Procedure: INTRAVASCULAR PRESSURE WIRE/FFR STUDY;  Surgeon: Nelva Bush, MD;  Location: [place] CV LAB;  Service: Cardiovascular;  Laterality: N/A;   INTRAVASCULAR ULTRASOUND/IVUS N/A 01/18/2020   Procedure: Intravascular Ultrasound/IVUS;  Surgeon: Nelva Bush, MD;  Location: [place] CV LAB;  Service: Cardiovascular;  Laterality: N/A;   LUMBAR LAMINECTOMY/DECOMPRESSION MICRODISCECTOMY N/A 02/07/2014   Procedure: LUMBAR DECOMPRESSION Lumbar one-Lumbar five;  Surgeon: [name], MD;  Location: [facility];  Service: Orthopedics;  Laterality: N/A;   RIGHT/LEFT HEART CATH AND CORONARY ANGIOGRAPHY N/A 11/02/2019   Procedure: RIGHT/LEFT HEART CATH AND CORONARY ANGIOGRAPHY;  Surgeon: Nelva Bush, MD;  Location: [place] CV LAB;  Service: Cardiovascular;  Laterality: N/A;   SHOULDER ARTHROSCOPY WITH SUBACROMIAL DECOMPRESSION Left 04/12/2014   Procedure: LEFT SHOULDER ARTHROSCOPY WITH SUBACROMIAL  DECOMPRESSION AND LABRAL DEBRIDEMENT, ROTATOR CUFF DEBRIDEMENT, BICEPS DEBRIDEMENT;  Surgeon: [name], MD;  Location: [facility];  Service: Orthopedics;  Laterality: Left;   VIDEO BRONCHOSCOPY WITH ENDOBRONCHIAL NAVIGATION N/A 04/09/2020   Procedure: VIDEO BRONCHOSCOPY WITH ENDOBRONCHIAL NAVIGATION;  Surgeon: Collene Gobble, MD;  Location: [place] ENDOSCOPY;  Service: Pulmonary;  Laterality: N/A;   VIDEO BRONCHOSCOPY WITH ENDOBRONCHIAL NAVIGATION Right 07/30/2020   Procedure: VIDEO BRONCHOSCOPY WITH ENDOBRONCHIAL NAVIGATION;  Surgeon: Collene Gobble, MD;  Location: [place] ENDOSCOPY;  Service: Pulmonary;  Laterality: Right;   VIDEO BRONCHOSCOPY WITH ENDOBRONCHIAL ULTRASOUND N/A 04/09/2020   Procedure: VIDEO BRONCHOSCOPY WITH ENDOBRONCHIAL ULTRASOUND;  Surgeon: Collene Gobble, MD;  Location: [facility] ENDOSCOPY;  Service: Pulmonary;  Laterality: N/A;   VIDEO BRONCHOSCOPY WITH RADIAL ENDOBRONCHIAL ULTRASOUND  04/09/2020   Procedure: VIDEO BRONCHOSCOPY WITH RADIAL ENDOBRONCHIAL ULTRASOUND;  Surgeon: Collene Gobble, MD;  Location: [facility] ENDOSCOPY;  Service: Pulmonary;;   VIDEO BRONCHOSCOPY WITH RADIAL ENDOBRONCHIAL ULTRASOUND  10/20/2021   Procedure: VIDEO BRONCHOSCOPY WITH RADIAL ENDOBRONCHIAL ULTRASOUND;  Surgeon: Collene Gobble, MD;  Location: [facility] ENDOSCOPY;  Service: Pulmonary;;     FAMILY HISTORY:  Family History  Family history unknown: Yes  SOCIAL HISTORY:  reports that he has been smoking cigarettes. He has a 27.00 pack-year smoking history. He has never used smokeless tobacco. He reports that he does not drink alcohol and does not use drugs. The patient is married and lives in [place]. He is retired from working for [employer].    ALLERGIES: Patient has no known allergies.   MEDICATIONS:  Current Outpatient Medications  Medication Sig Dispense Refill   albuterol (VENTOLIN HFA) 108 (90 Base) MCG/ACT inhaler Inhale 2 puffs into the lungs every 6 (six) hours as needed for wheezing  or shortness of breath. 18 g 3   amLODipine (NORVASC) 5 MG tablet Take 1 tablet by mouth once daily 90 tablet 1   apixaban (ELIQUIS) 5 MG TABS tablet Take 1 tablet (5 mg total) by mouth 2 (two) times daily. Okay to restart this medication on 10/21/2021 60 tablet 6   buPROPion (WELLBUTRIN XL) 300 MG 24 hr tablet Take 300 mg by mouth daily.     clopidogrel (PLAVIX) 75 MG tablet Take 1 tablet (75 mg total) by mouth daily with breakfast. Okay to restart this medication on 10/21/2021 90 tablet 1   cyclobenzaprine (FLEXERIL) 10 MG tablet Take 20 mg by mouth daily as needed for muscle spasms.     furosemide (LASIX) 40 MG tablet TAKE 1 TABLET BY MOUTH ONCE DAILY AS NEEDED FOR  EDEMA  OR  WEIGHT  GAIN 90 tablet 2   isosorbide mononitrate (IMDUR) 60 MG 24 hr tablet Take 1 tablet by mouth once daily 90 tablet 3   metFORMIN (GLUCOPHAGE) 500 MG tablet Take 1,000 mg by mouth 2 (two) times daily with a meal.     metoprolol succinate (TOPROL-XL) 25 MG 24 hr tablet Take 1 tablet by mouth once daily 90 tablet 2   nitroGLYCERIN (NITROSTAT) 0.4 MG SL tablet Place 1 tablet (0.4 mg total) under the tongue every 5 (five) minutes x 3 doses as needed for chest pain. 25 tablet 2   pantoprazole (PROTONIX) 40 MG tablet Take 1 tablet (40 mg total) by mouth daily. 60 tablet 2   potassium chloride SA (KLOR-CON) 20 MEQ tablet TAKE 1 TABLET BY MOUTH ONCE DAILY AS NEEDED - TAKE ON THE DAYS YOU TAKE LASIX 90 tablet 1   rosuvastatin (CRESTOR) 40 MG tablet Take 1 tablet by mouth once daily 90 tablet 2   RYBELSUS 3 MG TABS Take 3 mg by mouth every morning.     umeclidinium-vilanterol (ANORO ELLIPTA) 62.5-25 MCG/ACT AEPB Inhale 1 puff into the lungs daily. 180 each 3   valsartan (DIOVAN) 320 MG tablet Take 320 mg by mouth daily.     No current facility-administered medications for this encounter.     REVIEW OF SYSTEMS: On review of systems, the patient reports that he is doing well overall. He reports stable shortness of breath that  worsens with activity. He denies any cough or hemoptysis since his bronchoscopy. No other complaints are verbalized.        PHYSICAL EXAM:  Wt Readings from Last 3 Encounters:  11/11/21 251 lb 3.2 oz (113.9 kg)  11/04/21 253 lb 3.2 oz (114.9 kg)  10/20/21 250 lb (113.4 kg)   Temp Readings from Last 3 Encounters:  11/11/21 97.7 F (36.5 C)  11/04/21 97.6 F (36.4 C) (Oral)  10/20/21 97.9 F (36.6 C)   BP Readings from Last 3 Encounters:  11/11/21 104/73  11/04/21 120/76  10/20/21 114/81   Pulse Readings from Last 3 Encounters:  11/11/21 73  11/04/21 69  10/20/21 82   Pain Assessment Pain Score: 0-No pain/10  In general this is a well appearing caucasian male in no acute distress. He's alert and oriented x4 and appropriate throughout the examination. Cardiopulmonary assessment is negative for acute distress and he exhibits normal effort.     ECOG = 1  0 - Asymptomatic (Fully active, able to carry on all predisease activities without restriction)  1 - Symptomatic but completely ambulatory (Restricted in physically strenuous activity but ambulatory and able to carry out work of a light or sedentary nature. For example, light housework, office work)  2 - Symptomatic, <50% in bed during the day (Ambulatory and capable of all self care but unable to carry out any work activities. Up and about more than 50% of waking hours)  3 - Symptomatic, >50% in bed, but not bedbound (Capable of only limited self-care, confined to bed or chair 50% or more of waking hours)  4 - Bedbound (Completely disabled. Cannot carry on any self-care. Totally confined to bed or chair)  5 - Death   Eustace Pen MM, Creech RH, Tormey DC, et al. [phone]). "Toxicity and response criteria of the [facility]". [place] Oncol. 5 (6): 649-55    LABORATORY DATA:  Lab Results  Component Value Date   WBC 7.6 07/30/2020   HGB 14.3 07/30/2020   HCT 43.6 07/30/2020   MCV 94.4 07/30/2020    PLT 115 (L) 07/30/2020   Lab Results  Component Value Date   NA 139 07/30/2020   K 4.2 07/30/2020   CL 104 07/30/2020   CO2 25 07/30/2020   Lab Results  Component Value Date   ALT 30 04/09/2020   AST 24 04/09/2020   ALKPHOS 63 04/09/2020   BILITOT 0.7 04/09/2020      RADIOGRAPHY: DG Chest Port 1 View  Result Date: 10/20/2021 CLINICAL DATA:  Status post bronchoscopy and biopsy. EXAM: PORTABLE CHEST 1 VIEW COMPARISON:  CT chest dated October 13, 2021. FINDINGS: Stable cardiomediastinal silhouette with normal heart size enlarged hiatal hernia. Chronic interstitial thickening is unchanged. A fiducial marker in the medial right lung base. Unchanged scarring in the right upper lobe with adjacent fiducial markers. No focal consolidation, pleural effusion, or pneumothorax. No acute osseous abnormality. IMPRESSION: 1. New fiducial marker in the medial right lung base. No pneumothorax. 2. Unchanged chronic interstitial lung disease and right upper lobe scarring. Electronically Signed   By: Titus Dubin M.D.   On: 10/20/2021 13:46   CT Super D Chest Wo Contrast  Result Date: 10/13/2021 CLINICAL DATA:  66 year old male with history of asbestos exposure. Dyspnea. History of non-small cell lung cancer status post SBRT. Follow-up study. EXAM: CT CHEST WITHOUT CONTRAST TECHNIQUE: Multidetector CT imaging of the chest was performed using thin slice collimation for electromagnetic bronchoscopy planning purposes, without intravenous contrast. RADIATION DOSE REDUCTION: This exam was performed according to the departmental dose-optimization program which includes automated exposure control, adjustment of the mA and/or kV according to patient size and/or use of iterative reconstruction technique. COMPARISON:  Chest CT 07/24/2021.  PET-CT 08/07/2021. FINDINGS: Cardiovascular: Heart size is normal. There is no significant pericardial fluid, thickening or pericardial calcification. There is aortic atherosclerosis,  as well as atherosclerosis of the great vessels of the mediastinum and the coronary arteries, including calcified atherosclerotic plaque in the left main, left anterior descending, left circumflex and right coronary arteries. Calcifications of the aortic valve. Mediastinum/Nodes: No pathologically enlarged mediastinal or hilar lymph nodes. Please note that  accurate exclusion of hilar adenopathy is limited on noncontrast CT scans. Esophagus is unremarkable in appearance. No axillary lymphadenopathy. Hiatal hernia containing predominantly fat and a small amount of non organized appearing fluid, presumably ascites from the upper abdomen, chronic and unchanged. Lungs/Pleura: Fiducial markers in the right upper lobe surrounded by a mass-like area of architectural distortion, similar to the prior study, compatible with chronic postradiation mass-like fibrosis. Previously noted subpleural nodule of concern in the base of the right lower lobe (axial image 118 of series 4) is stable measuring 1.8 x 0.7 cm. Previously noted nodule in the anterior aspect of the right lower lobe abutting the major fissure appears slightly more prominent, currently measuring 8 x 6 mm (axial image 98 of series 4). No other definite new suspicious appearing pulmonary nodules or masses are noted. No acute consolidative airspace disease. No pleural effusions. Diffuse bronchial wall thickening with mild paraseptal emphysema. Widespread areas of ground-glass attenuation and septal thickening are again noted scattered throughout the lungs bilaterally. No honeycombing. Upper Abdomen: Aortic atherosclerosis. Musculoskeletal: There are no aggressive appearing lytic or blastic lesions noted in the visualized portions of the skeleton. IMPRESSION: 1. Slight increased size of small right lower lobe subpleural pulmonary nodule anteriorly. Other larger subpleural nodule in the base of the right lower lobe is grossly stable compared to the prior examination. No  new pulmonary nodules or masses are otherwise noted. Chronic postradiation mass-like fibrosis in the right upper lobe near the apex is similar to the prior examination. 2. The appearance of the lungs remains concerning for interstitial lung disease, likely asbestosis given the patient's history of asbestos exposure. 3. Diffuse bronchial wall thickening with mild paraseptal emphysema. 4. Aortic atherosclerosis, in addition to left main and three-vessel coronary artery disease. Please note that although the presence of coronary artery calcium documents the presence of coronary artery disease, the severity of this disease and any potential stenosis cannot be assessed on this non-gated CT examination. Assessment for potential risk factor modification, dietary therapy or pharmacologic therapy may be warranted, if clinically indicated. 5. There are calcifications of the aortic valve. Echocardiographic correlation for evaluation of potential valvular dysfunction may be warranted if clinically indicated. 6. Additional incidental findings, as above. Aortic Atherosclerosis (ICD10-I70.0) and Emphysema (ICD10-J43.9). Electronically Signed   By: Vinnie Langton M.D.   On: 10/13/2021 11:44   DG C-ARM BRONCHOSCOPY  Result Date: 10/20/2021 C-ARM BRONCHOSCOPY: Fluoroscopy was utilized by the requesting physician.  No radiographic interpretation.       IMPRESSION/PLAN: 1. Putative Stage IA2, cT1bN0M0, NSCLC of the RLL. Dr. Lisbeth Renshaw discusses the pathology findings and reviews the nature of early stage lung cancer but the limitations of atypical cells as opposed to malignant confirmation by pathology. Dr. Lisbeth Renshaw reviews that the standard of care is for surgical resection. However for patients who are not medical candidates to undergo surgery, or who choose to forgo surgery, stereotactic body radiotherapy (SBRT) is an appropriate alternative. We discussed the risks, benefits, short, and long term effects of radiotherapy, as well  as the curative intent, and the patient is interested in proceeding. Dr. Lisbeth Renshaw discusses the delivery and logistics of radiotherapy and anticipates a course of 3-5 fractions of SBRT. Written consent is obtained and placed in the chart, a copy was provided to the patient. He will be contacted by our staff to coordinate simulation.  2. Putative Stage IA1, cT1aN0M0, NSCLC of the RUL. This site remains without disease and will be followed along with #1. 3. Perifissural RLL nodule. We  will follow this nodule closely as he moves into surveillance after treatment of #1.   In a visit lasting 60 minutes, greater than 50% of the time was spent face to face discussing the patient's condition, in preparation for the discussion, and coordinating the patient's care.     The above documentation reflects my direct findings during this shared patient visit. Please see the separate note by Dr. Lisbeth Renshaw on this date for the remainder of the patient's plan of care.    Carola Rhine, [facility]   **Disclaimer: This note was dictated with voice recognition software. Similar sounding words can inadvertently be transcribed and this note may contain transcription errors which may not have been corrected upon publication of note.**

## 2021-11-11 ENCOUNTER — Ambulatory Visit
Admission: RE | Admit: 2021-11-11 | Discharge: 2021-11-11 | Disposition: A | Discharge status: Home / Self Care | Payer: Medicare Other | Source: Ambulatory Visit | Attending: Radiation Oncology | Admitting: Radiation Oncology

## 2021-11-11 ENCOUNTER — Other Ambulatory Visit: Payer: Self-pay

## 2021-11-11 ENCOUNTER — Encounter: Payer: Self-pay | Admitting: Radiation Oncology

## 2021-11-11 VITALS — BP 104/73 | HR 73 | Temp 97.7°F | Resp 20 | Ht 64.0 in | Wt 251.2 lb

## 2021-11-11 DIAGNOSIS — J849 Interstitial pulmonary disease, unspecified: Secondary | ICD-10-CM | POA: Insufficient documentation

## 2021-11-11 DIAGNOSIS — G4730 Sleep apnea, unspecified: Secondary | ICD-10-CM | POA: Diagnosis not present

## 2021-11-11 DIAGNOSIS — Z79899 Other long term (current) drug therapy: Secondary | ICD-10-CM | POA: Insufficient documentation

## 2021-11-11 DIAGNOSIS — R911 Solitary pulmonary nodule: Secondary | ICD-10-CM | POA: Diagnosis not present

## 2021-11-11 DIAGNOSIS — J439 Emphysema, unspecified: Secondary | ICD-10-CM | POA: Diagnosis not present

## 2021-11-11 DIAGNOSIS — M129 Arthropathy, unspecified: Secondary | ICD-10-CM | POA: Diagnosis not present

## 2021-11-11 DIAGNOSIS — E785 Hyperlipidemia, unspecified: Secondary | ICD-10-CM | POA: Insufficient documentation

## 2021-11-11 DIAGNOSIS — I4891 Unspecified atrial fibrillation: Secondary | ICD-10-CM | POA: Diagnosis not present

## 2021-11-11 DIAGNOSIS — K449 Diaphragmatic hernia without obstruction or gangrene: Secondary | ICD-10-CM | POA: Insufficient documentation

## 2021-11-11 DIAGNOSIS — I700 Atherosclerosis of aorta: Secondary | ICD-10-CM | POA: Diagnosis not present

## 2021-11-11 DIAGNOSIS — I10 Essential (primary) hypertension: Secondary | ICD-10-CM | POA: Insufficient documentation

## 2021-11-11 DIAGNOSIS — F17210 Nicotine dependence, cigarettes, uncomplicated: Secondary | ICD-10-CM | POA: Diagnosis not present

## 2021-11-11 DIAGNOSIS — Z923 Personal history of irradiation: Secondary | ICD-10-CM | POA: Diagnosis not present

## 2021-11-11 DIAGNOSIS — I2510 Atherosclerotic heart disease of native coronary artery without angina pectoris: Secondary | ICD-10-CM | POA: Diagnosis not present

## 2021-11-11 DIAGNOSIS — R918 Other nonspecific abnormal finding of lung field: Secondary | ICD-10-CM | POA: Insufficient documentation

## 2021-11-11 DIAGNOSIS — E119 Type 2 diabetes mellitus without complications: Secondary | ICD-10-CM | POA: Diagnosis not present

## 2021-11-11 DIAGNOSIS — K219 Gastro-esophageal reflux disease without esophagitis: Secondary | ICD-10-CM | POA: Diagnosis not present

## 2021-11-11 DIAGNOSIS — Z7901 Long term (current) use of anticoagulants: Secondary | ICD-10-CM | POA: Diagnosis not present

## 2021-11-11 DIAGNOSIS — Z7984 Long term (current) use of oral hypoglycemic drugs: Secondary | ICD-10-CM | POA: Insufficient documentation

## 2021-11-14 ENCOUNTER — Other Ambulatory Visit: Payer: Self-pay

## 2021-11-14 ENCOUNTER — Ambulatory Visit
Admission: RE | Admit: 2021-11-14 | Discharge: 2021-11-14 | Disposition: A | Discharge status: Home / Self Care | Payer: Medicare Other | Source: Ambulatory Visit | Attending: Radiation Oncology | Admitting: Radiation Oncology

## 2021-11-14 DIAGNOSIS — C3411 Malignant neoplasm of upper lobe, right bronchus or lung: Secondary | ICD-10-CM | POA: Diagnosis not present

## 2021-11-14 DIAGNOSIS — R911 Solitary pulmonary nodule: Secondary | ICD-10-CM | POA: Diagnosis not present

## 2021-11-14 DIAGNOSIS — F17210 Nicotine dependence, cigarettes, uncomplicated: Secondary | ICD-10-CM | POA: Diagnosis not present

## 2021-11-17 ENCOUNTER — Other Ambulatory Visit: Payer: Self-pay | Admitting: Internal Medicine

## 2021-11-17 DIAGNOSIS — I480 Paroxysmal atrial fibrillation: Secondary | ICD-10-CM

## 2021-11-18 NOTE — Telephone Encounter (Signed)
Eliquis 5mg  refill request received. Patient is 66 years old, weight-113.9kg, Crea-1.14 on 10/09/2021 via [facility] from [place], [place], and last seen by Dr. Harrington Challenger on 03/10/2021. Dose is appropriate based on dosing criteria. Will send in refill to requested pharmacy.

## 2021-11-25 ENCOUNTER — Other Ambulatory Visit: Payer: Self-pay

## 2021-11-25 ENCOUNTER — Ambulatory Visit
Admission: RE | Admit: 2021-11-25 | Discharge: 2021-11-25 | Disposition: A | Discharge status: Home / Self Care | Payer: Medicare Other | Source: Ambulatory Visit | Attending: Radiation Oncology | Admitting: Radiation Oncology

## 2021-11-25 DIAGNOSIS — R911 Solitary pulmonary nodule: Secondary | ICD-10-CM | POA: Insufficient documentation

## 2021-11-25 DIAGNOSIS — C3411 Malignant neoplasm of upper lobe, right bronchus or lung: Secondary | ICD-10-CM | POA: Insufficient documentation

## 2021-11-25 DIAGNOSIS — Z510 Encounter for antineoplastic radiation therapy: Secondary | ICD-10-CM | POA: Insufficient documentation

## 2021-11-26 ENCOUNTER — Ambulatory Visit: Payer: Medicare Other | Admitting: Radiation Oncology

## 2021-11-27 ENCOUNTER — Ambulatory Visit: Payer: Medicare Other | Admitting: Radiation Oncology

## 2021-11-28 ENCOUNTER — Ambulatory Visit: Payer: Medicare Other | Admitting: Radiation Oncology

## 2021-12-02 ENCOUNTER — Other Ambulatory Visit: Payer: Self-pay

## 2021-12-02 ENCOUNTER — Ambulatory Visit
Admission: RE | Admit: 2021-12-02 | Discharge: 2021-12-02 | Disposition: A | Discharge status: Home / Self Care | Payer: Medicare Other | Source: Ambulatory Visit | Attending: Radiation Oncology | Admitting: Radiation Oncology

## 2021-12-02 DIAGNOSIS — Z510 Encounter for antineoplastic radiation therapy: Secondary | ICD-10-CM | POA: Diagnosis not present

## 2021-12-02 DIAGNOSIS — F17210 Nicotine dependence, cigarettes, uncomplicated: Secondary | ICD-10-CM | POA: Diagnosis not present

## 2021-12-02 DIAGNOSIS — C3411 Malignant neoplasm of upper lobe, right bronchus or lung: Secondary | ICD-10-CM | POA: Diagnosis not present

## 2021-12-02 DIAGNOSIS — R911 Solitary pulmonary nodule: Secondary | ICD-10-CM | POA: Diagnosis not present

## 2021-12-03 ENCOUNTER — Ambulatory Visit: Payer: Medicare Other | Admitting: Radiation Oncology

## 2021-12-04 ENCOUNTER — Other Ambulatory Visit: Payer: Self-pay

## 2021-12-04 ENCOUNTER — Ambulatory Visit
Admission: RE | Admit: 2021-12-04 | Discharge: 2021-12-04 | Disposition: A | Discharge status: Home / Self Care | Payer: Medicare Other | Source: Ambulatory Visit | Attending: Radiation Oncology | Admitting: Radiation Oncology

## 2021-12-04 DIAGNOSIS — C3411 Malignant neoplasm of upper lobe, right bronchus or lung: Secondary | ICD-10-CM | POA: Diagnosis not present

## 2021-12-04 DIAGNOSIS — R911 Solitary pulmonary nodule: Secondary | ICD-10-CM | POA: Diagnosis not present

## 2021-12-04 DIAGNOSIS — Z510 Encounter for antineoplastic radiation therapy: Secondary | ICD-10-CM | POA: Diagnosis not present

## 2021-12-09 ENCOUNTER — Other Ambulatory Visit: Payer: Self-pay

## 2021-12-09 ENCOUNTER — Encounter: Payer: Self-pay | Admitting: Radiation Oncology

## 2021-12-09 ENCOUNTER — Ambulatory Visit
Admission: RE | Admit: 2021-12-09 | Discharge: 2021-12-09 | Disposition: A | Discharge status: Home / Self Care | Payer: Medicare Other | Source: Ambulatory Visit | Attending: Radiation Oncology | Admitting: Radiation Oncology

## 2021-12-09 DIAGNOSIS — Z510 Encounter for antineoplastic radiation therapy: Secondary | ICD-10-CM | POA: Diagnosis not present

## 2021-12-09 DIAGNOSIS — Z20822 Contact with and (suspected) exposure to covid-19: Secondary | ICD-10-CM | POA: Diagnosis not present

## 2021-12-09 DIAGNOSIS — C3411 Malignant neoplasm of upper lobe, right bronchus or lung: Secondary | ICD-10-CM | POA: Diagnosis not present

## 2021-12-09 DIAGNOSIS — F17210 Nicotine dependence, cigarettes, uncomplicated: Secondary | ICD-10-CM | POA: Diagnosis not present

## 2021-12-09 DIAGNOSIS — R911 Solitary pulmonary nodule: Secondary | ICD-10-CM | POA: Diagnosis not present

## 2021-12-15 DIAGNOSIS — Z20822 Contact with and (suspected) exposure to covid-19: Secondary | ICD-10-CM | POA: Diagnosis not present

## 2021-12-16 ENCOUNTER — Other Ambulatory Visit: Payer: Self-pay | Admitting: Radiation Oncology

## 2021-12-16 DIAGNOSIS — R911 Solitary pulmonary nodule: Secondary | ICD-10-CM

## 2021-12-16 DIAGNOSIS — C3490 Malignant neoplasm of unspecified part of unspecified bronchus or lung: Secondary | ICD-10-CM

## 2021-12-16 DIAGNOSIS — C3411 Malignant neoplasm of upper lobe, right bronchus or lung: Secondary | ICD-10-CM

## 2021-12-16 NOTE — Progress Notes (Signed)
? ?                                                                                                                                                          ?  Patient Name: GIORDANO GETMAN ?MRN: [id number] ?DOB: 04-25-1956 ?Referring Physician: Baltazar Apo (Profile Not Attached) ?Date of Service: 12/09/2021 ?[facility] [place] ? ?                                                      End Of Treatment Note ? ?Diagnoses: R91.8-Other nonspecific abnormal finding of lung field ? ?Cancer Staging:   Putative Stage IA2, cT1bN0M0, NSCLC of the RLL;  Putative Stage IA1, cT1aN0M0, NSCLC of the RUL ? ?Intent: Curative ? ?Radiation Treatment Dates:  ?12/02/2021 through 12/09/2021 ?SBRT Treatment ?Site Technique Total Dose (Gy) Dose per Fx (Gy) Completed Fx Beam Energies  ?Lung, Right: Lung_R IMRT 54/54 18 3/3 10XFFF  ? ?Narrative: The patient tolerated radiation therapy relatively well.  ? ?Plan: The patient will receive a call in about one month from the radiation oncology department. We will plan to follow up with CT imaging in 6-8 weeks time.  ? ?________________________________________________ ? ? ? ?Carola Rhine, PAC  ?

## 2021-12-17 ENCOUNTER — Telehealth: Payer: Self-pay | Admitting: *Deleted

## 2021-12-17 NOTE — Telephone Encounter (Signed)
[employer] paperwork received 12/10/2021 completed today by this forms nurse with corrections to previously completed sections as received to provider address.  Form to designated mail area for radiation provider review and signature.      ?

## 2021-12-18 ENCOUNTER — Other Ambulatory Visit: Payer: Self-pay | Admitting: Internal Medicine

## 2021-12-18 NOTE — Telephone Encounter (Signed)
[employer] Critical Illness claim signed by provider.  Mailed to [employer] address on file: ?[place] [phone]  ?as requested by patient.  Note reads "Please mail back to my home address.  I have more paper work and claim info to go with it."  ? ?Copy to Radiation department for EMR scan process.   ?

## 2021-12-25 ENCOUNTER — Encounter: Payer: Self-pay | Admitting: Cardiology

## 2021-12-25 ENCOUNTER — Ambulatory Visit (INDEPENDENT_AMBULATORY_CARE_PROVIDER_SITE_OTHER): Payer: Medicare Other | Admitting: Cardiology

## 2021-12-25 ENCOUNTER — Telehealth: Payer: Self-pay | Admitting: Emergency Medicine

## 2021-12-25 VITALS — BP 110/66 | HR 63 | Ht 64.0 in | Wt 249.0 lb

## 2021-12-25 DIAGNOSIS — I25118 Atherosclerotic heart disease of native coronary artery with other forms of angina pectoris: Secondary | ICD-10-CM

## 2021-12-25 DIAGNOSIS — I10 Essential (primary) hypertension: Secondary | ICD-10-CM

## 2021-12-25 DIAGNOSIS — E7800 Pure hypercholesterolemia, unspecified: Secondary | ICD-10-CM

## 2021-12-25 DIAGNOSIS — I480 Paroxysmal atrial fibrillation: Secondary | ICD-10-CM | POA: Diagnosis not present

## 2021-12-25 DIAGNOSIS — Z20822 Contact with and (suspected) exposure to covid-19: Secondary | ICD-10-CM | POA: Diagnosis not present

## 2021-12-25 MED ORDER — CLOPIDOGREL BISULFATE 75 MG PO TABS: 75.0000 mg | ORAL_TABLET | Freq: Every day | ORAL | 3 refills | Status: AC

## 2021-12-25 MED ORDER — AMLODIPINE BESYLATE 5 MG PO TABS: 5.0000 mg | ORAL_TABLET | Freq: Every day | ORAL | 3 refills | Status: DC

## 2021-12-25 MED ORDER — POTASSIUM CHLORIDE CRYS ER 20 MEQ PO TBCR
EXTENDED_RELEASE_TABLET | ORAL | 3 refills | Status: AC
Start: 2021-12-25 — End: ?

## 2021-12-25 MED ORDER — FUROSEMIDE 40 MG PO TABS: ORAL_TABLET | ORAL | 3 refills | Status: AC

## 2021-12-25 MED ORDER — METOPROLOL SUCCINATE ER 25 MG PO TB24: 25.0000 mg | ORAL_TABLET | Freq: Every day | ORAL | 3 refills | Status: AC

## 2021-12-25 MED ORDER — APIXABAN 5 MG PO TABS: 5.0000 mg | ORAL_TABLET | Freq: Two times a day (BID) | ORAL | 3 refills | Status: AC

## 2021-12-25 MED ORDER — ISOSORBIDE MONONITRATE ER 60 MG PO TB24: 60.0000 mg | ORAL_TABLET | Freq: Every day | ORAL | 3 refills | Status: AC

## 2021-12-25 MED ORDER — NITROGLYCERIN 0.4 MG SL SUBL
0.4000 mg | SUBLINGUAL_TABLET | SUBLINGUAL | 2 refills | Status: AC | PRN
Start: 2021-12-25 — End: 2022-12-25

## 2021-12-25 MED ORDER — ROSUVASTATIN CALCIUM 40 MG PO TABS: 40.0000 mg | ORAL_TABLET | Freq: Every day | ORAL | 3 refills | Status: DC

## 2021-12-25 NOTE — Patient Instructions (Signed)
Medication Instructions:  ?Your physician recommends that you continue on your current medications as directed. Please refer to the Current Medication list given to you today. ? ?*If you need a refill on your cardiac medications before your next appointment, please call your pharmacy* ? ? ?Follow-Up: ?At [facility], you and your health needs are our priority.  As part of our continuing mission to provide you with exceptional heart care, we have created designated Provider Care Teams.  These Care Teams include your primary Cardiologist (physician) and Advanced Practice Providers (APPs -  Physician Assistants and Nurse Practitioners) who all work together to provide you with the care you need, when you need it. ? ?Your next appointment:   ?1 year(s) ? ?The format for your next appointment:   ?In Person ? ?Provider:   ?You may see [name], MD or one of the following Advanced Practice Providers on your designated Care Team:   ?Bernerd Pho, PA-C  ?Ermalinda Barrios, PA-C { ? ?

## 2021-12-25 NOTE — Addendum Note (Signed)
Addended by: Antonieta Iba on: 12/25/2021 10:10 AM ? ? Modules accepted: Orders ? ?

## 2021-12-25 NOTE — Progress Notes (Signed)
? ?Cardiology Office Note ? ? ?Date:  12/25/2021  ? ?ID:  Daniel Ball, DOB 1956/02/13, MRN [id number] ? ?PCP:  [name], MD  ?Cardiologist:   Fransico Him, MD  ? ?Pt presents for f/u of CAD ? ?  ?History of Present Illness: ?Daniel Ball is a 66 y.o. male with a history of HTN, COPD, tobacco use, reported OSA  and CAD    He is followed by Dr Dema Severin at [place].  He had previously seen [place] and then Dr. Harrington Challenger saw him in 2021 with symptoms of chest tightness and SOB.[name]  He underwent cardiac cath 10/30/2019 showing 60% proximal LAD that was  heavily calcified and hemodynamically significant DFR 0.86 as well as a multifocal RCA disease with long segment of 40% proximal stenosis and irregular distal disease 70 to 80% not hemodynamically significant DFR 0.94 and ostial stenosis in the right PDA 70%, mild reduced LV function EF 45 to 50% mildly elevated right heart filling pressures.  Aggressive medical therapy for CAD and cardiomyopathy was recommended and if he had recurrent angina despite maximum tolerated doses of at least 2 antianginals then would recommend PCI of the LAD.  Due to heavy calcification atherectomy would need to be considered.  Prior to discharge the patient went into A. fib with rate controlled.  Eliquis 5 mg twice daily was added for CHA2DS2-VASc of 3 hypertension CAD and CHF. ? ?The pt continued to complaine of dyspnea and on 01/18/20 underwent athrectomy to prox/mid LAD with Synergy 3.5x 28 mm DES placed.  The patient continued to have shortness of breath after his PCI and his long-acting nitrate did not really help his symptoms so that was discontinued. ? ?He was last seen by Dr. Harrington Challenger in June 2022 at which time he was doing well with improvement in his shortness of breath. ? ?he is here today for followup and is doing well.  He tells me that rarely he will get a sharp pain in the center of his chest that radiates to his back but nothing like his anginal pain.  He was dx with SSCa of the lung  and is s/p XRT and has just finished XRT to his RLL a few weeks ago. He has chronic DOE that he thinks is very stable.  He denies any anginal chest pain or pressure, PND, orthopnea, LE edema, dizziness (except when getting up to fast), palpitations or syncope. He is compliant with his meds and is tolerating meds with no SE.    ? ?Current Meds  ?Medication Sig  ? albuterol (VENTOLIN HFA) 108 (90 Base) MCG/ACT inhaler Inhale 2 puffs into the lungs every 6 (six) hours as needed for wheezing or shortness of breath.  ? amLODipine (NORVASC) 5 MG tablet Take 1 tablet (5 mg total) by mouth daily.  ? apixaban (ELIQUIS) 5 MG TABS tablet Take 1 tablet by mouth twice daily  ? buPROPion (WELLBUTRIN XL) 300 MG 24 hr tablet Take 300 mg by mouth daily.  ? clopidogrel (PLAVIX) 75 MG tablet Take 1 tablet (75 mg total) by mouth daily with breakfast. Okay to restart this medication on 10/21/2021  ? cyclobenzaprine (FLEXERIL) 10 MG tablet Take 20 mg by mouth daily as needed for muscle spasms.  ? furosemide (LASIX) 40 MG tablet TAKE 1 TABLET BY MOUTH ONCE DAILY AS NEEDED FOR  EDEMA  OR  WEIGHT  GAIN  ? isosorbide mononitrate (IMDUR) 60 MG 24 hr tablet Take 1 tablet by mouth once daily  ? metFORMIN (GLUCOPHAGE)  500 MG tablet Take 1,000 mg by mouth 2 (two) times daily with a meal.  ? metoprolol succinate (TOPROL-XL) 25 MG 24 hr tablet Take 1 tablet by mouth once daily  ? nitroGLYCERIN (NITROSTAT) 0.4 MG SL tablet Place 1 tablet (0.4 mg total) under the tongue every 5 (five) minutes x 3 doses as needed for chest pain.  ? pantoprazole (PROTONIX) 40 MG tablet Take 1 tablet (40 mg total) by mouth daily.  ? potassium chloride SA (KLOR-CON) 20 MEQ tablet TAKE 1 TABLET BY MOUTH ONCE DAILY AS NEEDED - TAKE ON THE DAYS YOU TAKE LASIX  ? rosuvastatin (CRESTOR) 40 MG tablet Take 1 tablet by mouth once daily  ? RYBELSUS 3 MG TABS Take 3 mg by mouth every morning.  ? umeclidinium-vilanterol (ANORO ELLIPTA) 62.5-25 MCG/ACT AEPB Inhale 1 puff into the  lungs daily.  ? valsartan (DIOVAN) 320 MG tablet Take 320 mg by mouth daily.  ? ? ? ?Allergies:   Patient has no known allergies.  ? ?Past Medical History:  ?Diagnosis Date  ? Arthritis   ? Atrial fibrillation ([place]) 11/02/2019  ? COPD (chronic obstructive pulmonary disease) ([place])   ? Coronary artery disease   ? a. 12/2019: s/p orbital atherectomy and DES placement to proximal/mid LAD.   ? Diabetes mellitus without complication ([place])   ? GERD (gastroesophageal reflux disease)   ? History of radiation therapy 09/11/20, 09/16/21, 09/18/20  ?  Right lung- SBRT Dr. Sondra Come   ? Hyperlipidemia   ? Hypertension   ? hx HBP - MEDS DC'D 10 YRS since BP has been in normal range  ? Impingement syndrome of shoulder   ? left  ? Non-small cell cancer of right lung ([place])   ? Shortness of breath   ? with exertion  ? Sleep apnea   ? uses a cpap  ? Spinal stenosis   ? ? ?Past Surgical History:  ?Procedure Laterality Date  ? BRONCHIAL BIOPSY  04/09/2020  ? Procedure: BRONCHIAL BIOPSIES;  Surgeon: Collene Gobble, MD;  Location: [facility] ENDOSCOPY;  Service: Pulmonary;;  ? BRONCHIAL BIOPSY  10/20/2021  ? Procedure: BRONCHIAL BIOPSIES;  Surgeon: Collene Gobble, MD;  Location: [facility] ENDOSCOPY;  Service: Pulmonary;;  ? BRONCHIAL BRUSHINGS  04/09/2020  ? Procedure: BRONCHIAL BRUSHINGS;  Surgeon: Collene Gobble, MD;  Location: [facility] ENDOSCOPY;  Service: Pulmonary;;  ? BRONCHIAL BRUSHINGS  07/30/2020  ? Procedure: BRONCHIAL BRUSHINGS;  Surgeon: Collene Gobble, MD;  Location: [facility] ENDOSCOPY;  Service: Pulmonary;;  ? BRONCHIAL BRUSHINGS  10/20/2021  ? Procedure: BRONCHIAL BRUSHINGS;  Surgeon: Collene Gobble, MD;  Location: [facility] ENDOSCOPY;  Service: Pulmonary;;  ? BRONCHIAL NEEDLE ASPIRATION BIOPSY  04/09/2020  ? Procedure: BRONCHIAL NEEDLE ASPIRATION BIOPSIES;  Surgeon: Collene Gobble, MD;  Location: [facility] ENDOSCOPY;  Service: Pulmonary;;  ? BRONCHIAL NEEDLE ASPIRATION BIOPSY  07/30/2020  ? Procedure: BRONCHIAL NEEDLE ASPIRATION BIOPSIES;  Surgeon: Collene Gobble,  MD;  Location: [facility] ENDOSCOPY;  Service: Pulmonary;;  ? BRONCHIAL NEEDLE ASPIRATION BIOPSY  10/20/2021  ? Procedure: BRONCHIAL NEEDLE ASPIRATION BIOPSIES;  Surgeon: Collene Gobble, MD;  Location: [facility] ENDOSCOPY;  Service: Pulmonary;;  ? BRONCHIAL WASHINGS  04/09/2020  ? Procedure: BRONCHIAL WASHINGS;  Surgeon: Collene Gobble, MD;  Location: [facility] ENDOSCOPY;  Service: Pulmonary;;  ? BRONCHIAL WASHINGS  07/30/2020  ? Procedure: BRONCHIAL WASHINGS;  Surgeon: Collene Gobble, MD;  Location: [facility] ENDOSCOPY;  Service: Pulmonary;;  ? BRONCHIAL WASHINGS  10/20/2021  ? Procedure: BRONCHIAL WASHINGS;  Surgeon: Collene Gobble, MD;  Location: [facility] ENDOSCOPY;  Service: Pulmonary;;  ? CORONARY ATHERECTOMY N/A 01/18/2020  ? Procedure: CORONARY ATHERECTOMY;  Surgeon: Nelva Bush, MD;  Location: [facility] CV LAB;  Service: Cardiovascular;  Laterality: N/A;  ? CORONARY STENT INTERVENTION  01/18/2020  ? CORONARY STENT INTERVENTION N/A 01/18/2020  ? Procedure: CORONARY STENT INTERVENTION;  Surgeon: Nelva Bush, MD;  Location: [place] CV LAB;  Service: Cardiovascular;  Laterality: N/A;  ? EYE SURGERY Left   ? FIDUCIAL MARKER PLACEMENT  04/09/2020  ? Procedure: FIDUCIAL MARKER PLACEMENT;  Surgeon: Collene Gobble, MD;  Location: [facility] ENDOSCOPY;  Service: Pulmonary;;  ? FIDUCIAL MARKER PLACEMENT  10/20/2021  ? Procedure: FIDUCIAL MARKER PLACEMENT;  Surgeon: Collene Gobble, MD;  Location: [facility] ENDOSCOPY;  Service: Pulmonary;;  ? FINE NEEDLE ASPIRATION  04/09/2020  ? Procedure: FINE NEEDLE ASPIRATION (FNA) LINEAR;  Surgeon: Collene Gobble, MD;  Location: [facility] ENDOSCOPY;  Service: Pulmonary;;  ? HAND SURGERY Right   ? thumb  ? INTRAVASCULAR PRESSURE WIRE/FFR STUDY N/A 11/02/2019  ? Procedure: INTRAVASCULAR PRESSURE WIRE/FFR STUDY;  Surgeon: Nelva Bush, MD;  Location: [place] CV LAB;  Service: Cardiovascular;  Laterality: N/A;  ? INTRAVASCULAR ULTRASOUND/IVUS N/A 01/18/2020  ? Procedure: Intravascular Ultrasound/IVUS;  Surgeon: Nelva Bush, MD;  Location: [place] CV LAB;  Service: Cardiovascular;  Laterality: N/A;  ? LUMBAR LAMINECTOMY/DECOMPRESSION MICRODISCECTOMY N/A 02/07/2014  ? Procedure: LUMBAR DECOMPRESSION Lumbar one-Lumba

## 2021-12-26 NOTE — Telephone Encounter (Signed)
When you are able to can you confirm if we have received the paperwork from patient for [place].  ? ?I called patient and he did state that he dropped them off and papers were placed in Dr Sudie Bailey box.  ? ?Please advise  ?

## 2021-12-26 NOTE — Telephone Encounter (Signed)
Spoke with pt to notify him that paperwork had been completed and returned to him via mail as requested on the paperwork. Copy of completed paperwork was stored in Dr. Agustina Caroli cabinet. Nothing further needed at this time.   ?

## 2022-01-02 NOTE — Telephone Encounter (Signed)
disregard

## 2022-01-05 ENCOUNTER — Ambulatory Visit
Admission: RE | Admit: 2022-01-05 | Discharge: 2022-01-05 | Disposition: A | Discharge status: Home / Self Care | Payer: Medicare Other | Source: Ambulatory Visit | Attending: Radiation Oncology | Admitting: Radiation Oncology

## 2022-01-05 DIAGNOSIS — C3411 Malignant neoplasm of upper lobe, right bronchus or lung: Secondary | ICD-10-CM | POA: Insufficient documentation

## 2022-01-05 DIAGNOSIS — Z20822 Contact with and (suspected) exposure to covid-19: Secondary | ICD-10-CM | POA: Diagnosis not present

## 2022-01-05 NOTE — Progress Notes (Signed)
?  Radiation Oncology         [phone] ?________________________________ ? ?Name: XYON LUKASIK MRN: [id number]  ?Date of Service: 01/05/2022  DOB: 04/17/1956 ? ?Post Treatment Telephone Note ? ?Diagnosis:   Putative Stage IA2, cT1bN0M0, NSCLC of the RLL; History of Putative Stage IA1, cT1aN0M0, NSCLC of the RUL ? ?Intent: Curative ? ?Radiation Treatment Dates:  ?12/02/2021 through 12/09/2021 ?SBRT Treatment ?Site Technique Total Dose (Gy) Dose per Fx (Gy) Completed Fx Beam Energies  ?Lung, Right: Lung_R IMRT 54/54 18 3/3 10XFFF  ? ?Narrative: The patient tolerated radiation therapy relatively well.  ? ?Impression/Plan: ?1. Putative Stage IA2, cT1bN0M0, NSCLC of the RLL;  Putative Stage IA1, cT1aN0M0, NSCLC of the RUL. The patient has been doing well since completion of radiotherapy. We will follow up with his CT scan that's been ordered in a few weeks especially to look at the RLL nodule we just treated, the history of the RUL nodule treated in 2021, and a perifissural RLL nodule that was not treated.  ? ? ? ? ? ?Carola Rhine, PAC  ? ? ? ? ?

## 2022-01-13 ENCOUNTER — Telehealth: Payer: Self-pay | Admitting: Emergency Medicine

## 2022-01-14 NOTE — Telephone Encounter (Signed)
Spoke with Angie at [facility] and verified fax number as well what documents were needed by [employer]. Faxed requested information requested. Nothing further needed at this time.  ?

## 2022-01-24 DIAGNOSIS — Z20822 Contact with and (suspected) exposure to covid-19: Secondary | ICD-10-CM | POA: Diagnosis not present

## 2022-01-26 ENCOUNTER — Ambulatory Visit (HOSPITAL_COMMUNITY)
Admission: RE | Admit: 2022-01-26 | Discharge: 2022-01-26 | Disposition: A | Discharge status: Home / Self Care | Payer: Medicare Other | Source: Ambulatory Visit | Attending: Radiation Oncology | Admitting: Radiation Oncology

## 2022-01-26 ENCOUNTER — Telehealth: Payer: Self-pay | Admitting: Radiation Oncology

## 2022-01-26 DIAGNOSIS — R911 Solitary pulmonary nodule: Secondary | ICD-10-CM | POA: Insufficient documentation

## 2022-01-26 DIAGNOSIS — J439 Emphysema, unspecified: Secondary | ICD-10-CM | POA: Diagnosis not present

## 2022-01-26 DIAGNOSIS — J8410 Pulmonary fibrosis, unspecified: Secondary | ICD-10-CM | POA: Diagnosis not present

## 2022-01-26 DIAGNOSIS — C3411 Malignant neoplasm of upper lobe, right bronchus or lung: Secondary | ICD-10-CM | POA: Diagnosis not present

## 2022-01-26 DIAGNOSIS — C3490 Malignant neoplasm of unspecified part of unspecified bronchus or lung: Secondary | ICD-10-CM | POA: Insufficient documentation

## 2022-01-26 DIAGNOSIS — I700 Atherosclerosis of aorta: Secondary | ICD-10-CM | POA: Diagnosis not present

## 2022-01-26 DIAGNOSIS — R918 Other nonspecific abnormal finding of lung field: Secondary | ICD-10-CM | POA: Diagnosis not present

## 2022-01-26 DIAGNOSIS — Z20822 Contact with and (suspected) exposure to covid-19: Secondary | ICD-10-CM | POA: Diagnosis not present

## 2022-01-26 LAB — POCT I-STAT CREATININE: Creatinine, Ser: 1.2 mg/dL (ref 0.61–1.24)

## 2022-01-26 MED ORDER — SODIUM CHLORIDE (PF) 0.9 % IJ SOLN
INTRAMUSCULAR | Status: AC
  Filled 2022-01-26: qty 50

## 2022-01-26 MED ORDER — IOHEXOL 300 MG/ML  SOLN
75.0000 mL | Freq: Once | INTRAMUSCULAR | Status: AC | PRN
  Administered 2022-01-26: 75 mL via INTRAVENOUS

## 2022-01-26 NOTE — Telephone Encounter (Signed)
I called the patient to let him know the results of his CT scan and plans to repeat per NCCN guidelines in 5 months. He is in agreement with this plan.  ?

## 2022-02-04 ENCOUNTER — Ambulatory Visit: Payer: Self-pay | Admitting: Radiation Oncology

## 2022-02-06 DIAGNOSIS — F320 Major depressive disorder, single episode, mild: Secondary | ICD-10-CM | POA: Diagnosis not present

## 2022-02-06 DIAGNOSIS — J449 Chronic obstructive pulmonary disease, unspecified: Secondary | ICD-10-CM | POA: Diagnosis not present

## 2022-02-06 DIAGNOSIS — I480 Paroxysmal atrial fibrillation: Secondary | ICD-10-CM | POA: Diagnosis not present

## 2022-02-06 DIAGNOSIS — Z85118 Personal history of other malignant neoplasm of bronchus and lung: Secondary | ICD-10-CM | POA: Diagnosis not present

## 2022-02-06 DIAGNOSIS — C3490 Malignant neoplasm of unspecified part of unspecified bronchus or lung: Secondary | ICD-10-CM | POA: Diagnosis not present

## 2022-02-06 DIAGNOSIS — K219 Gastro-esophageal reflux disease without esophagitis: Secondary | ICD-10-CM | POA: Diagnosis not present

## 2022-02-06 DIAGNOSIS — E782 Mixed hyperlipidemia: Secondary | ICD-10-CM | POA: Diagnosis not present

## 2022-02-06 DIAGNOSIS — E1169 Type 2 diabetes mellitus with other specified complication: Secondary | ICD-10-CM | POA: Diagnosis not present

## 2022-02-06 DIAGNOSIS — I10 Essential (primary) hypertension: Secondary | ICD-10-CM | POA: Diagnosis not present

## 2022-02-06 DIAGNOSIS — I25118 Atherosclerotic heart disease of native coronary artery with other forms of angina pectoris: Secondary | ICD-10-CM | POA: Diagnosis not present

## 2022-02-06 DIAGNOSIS — N400 Enlarged prostate without lower urinary tract symptoms: Secondary | ICD-10-CM | POA: Diagnosis not present

## 2022-02-23 ENCOUNTER — Encounter: Payer: Self-pay | Admitting: Radiation Oncology

## 2022-03-22 IMAGING — CT CT CHEST HIGH RESOLUTION W/O CM
3 of 5 series · 14 of 36 positions shown, 15 images · non-contrast
Comparison: 06/05/2019 chest CT.

CLINICAL DATA: Follow-up interstitial lung disease and pulmonary
nodularity. Dyspnea with minimal activity. COPD. Asbestos exposure.

EXAM:
CT CHEST WITHOUT CONTRAST
TECHNIQUE: Multidetector CT imaging of the chest was performed following the
standard protocol without intravenous contrast. High resolution
imaging of the lungs, as well as inspiratory and expiratory imaging,
was performed.

[Series 3: standard chest · axial · 0.83mm/px · z∈[+1344,+1572]mm · 8 of 142 slices shown]
[im 14/142  mediastinal]
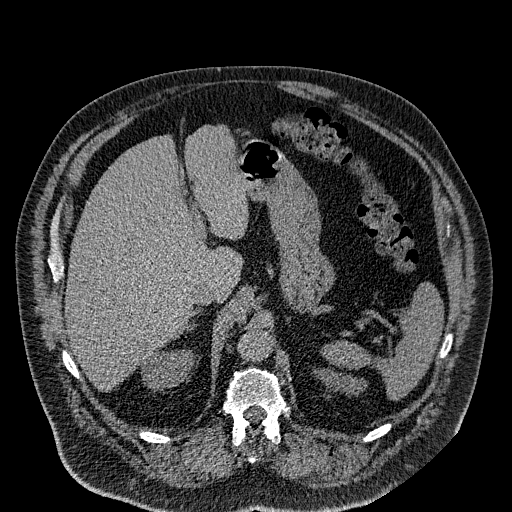
[im 27/142  mediastinal]
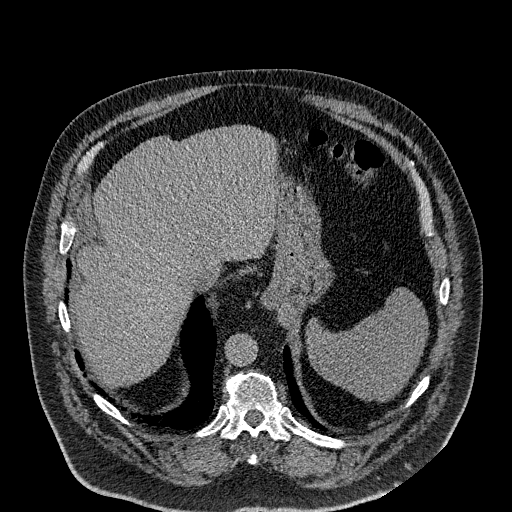
[im 48/142  mediastinal]
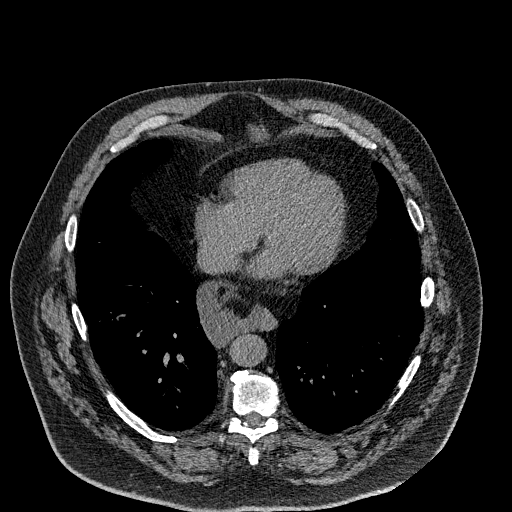
[im 61/142  mediastinal]
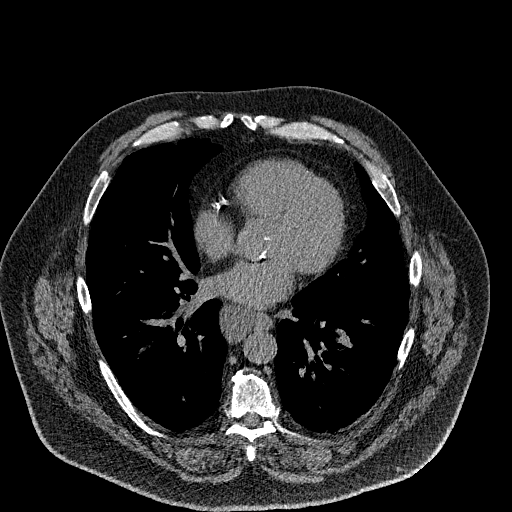
[im 81/142  mediastinal]
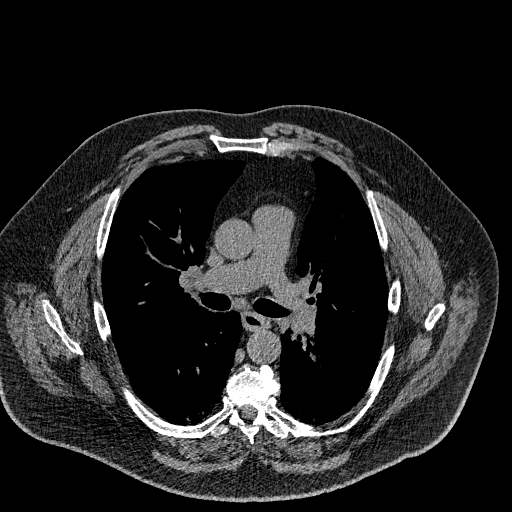
[im 95/142  mediastinal]
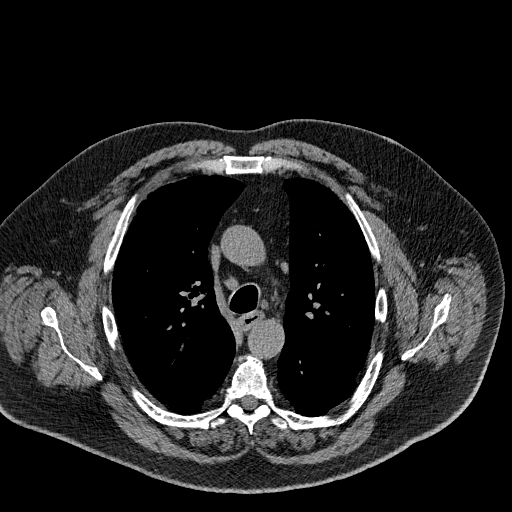
[im 115/142  mediastinal]
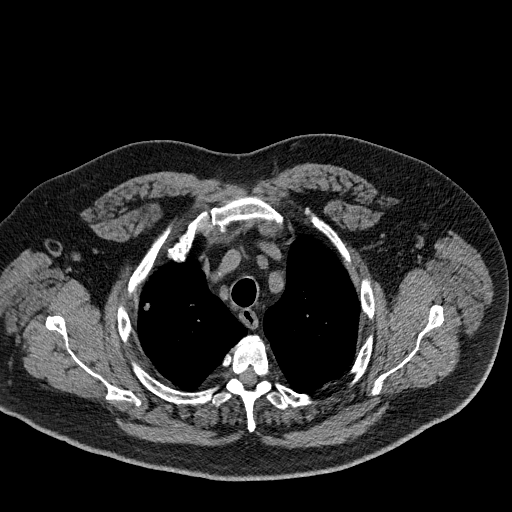
[im 128/142  mediastinal]
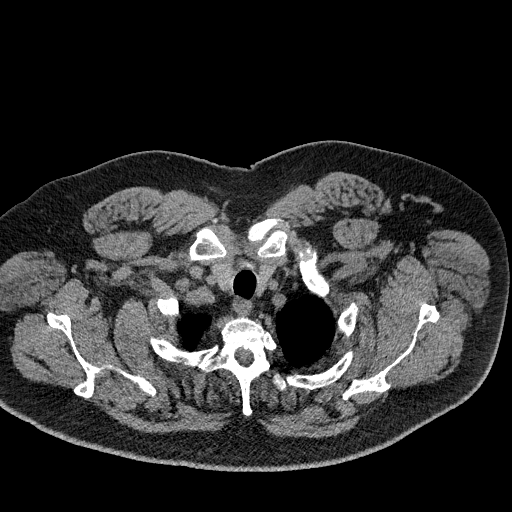

[Series 7: coronal · coronal · 0.55mm/px · 3 of 183 slices shown]
[im 37/183  lung]
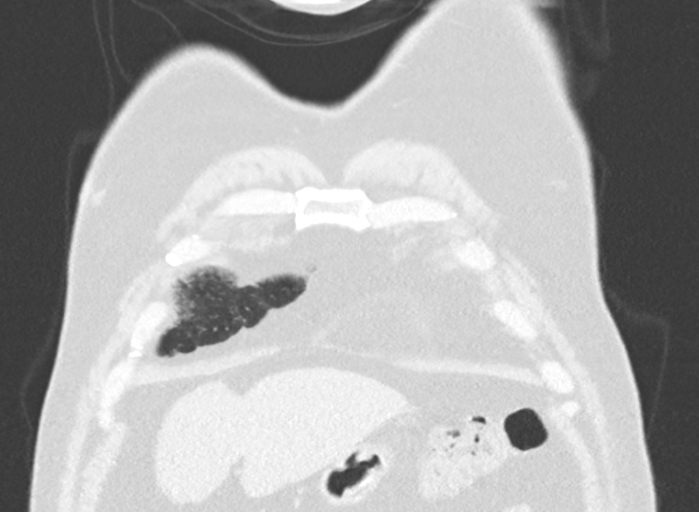
[im 73/183  lung]
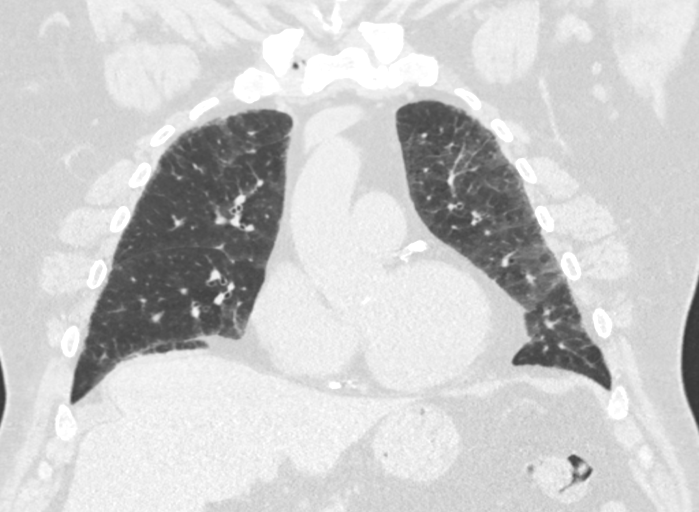
[im 110/183  lung]
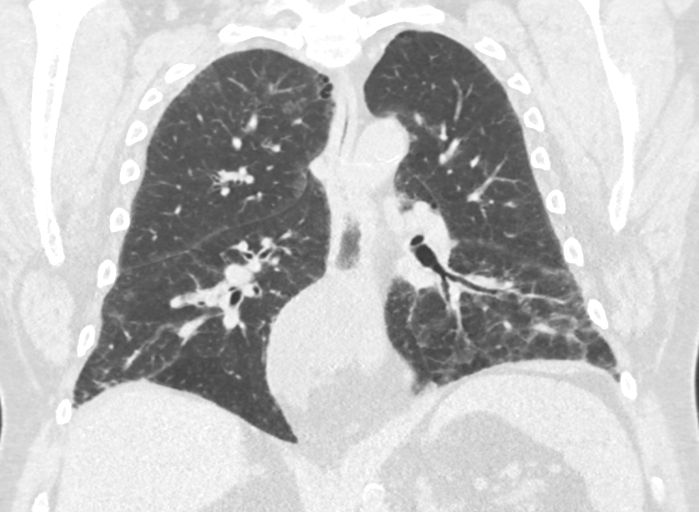

[Series 9: high res insp · axial · 0.79mm/px · z∈[+1320,+1591]mm · 3 of 20 slices shown, 4 images]
[im 1/20  mediastinal]
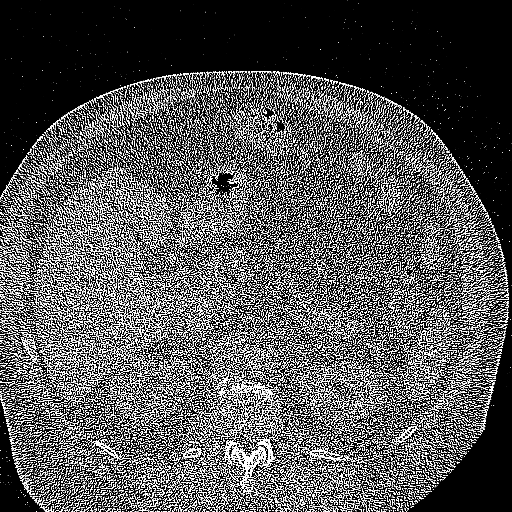
[im 1/20  lung]
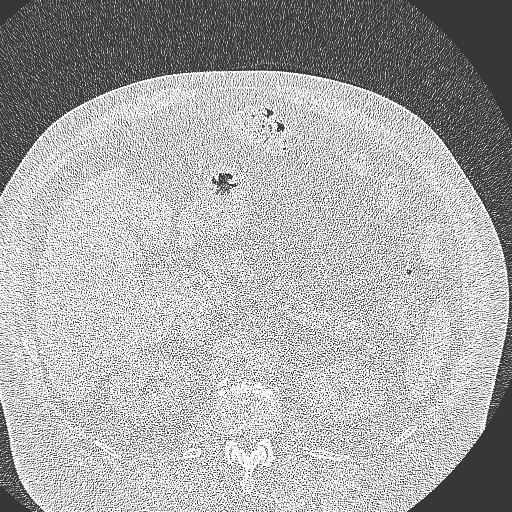
[im 10/20  lung]
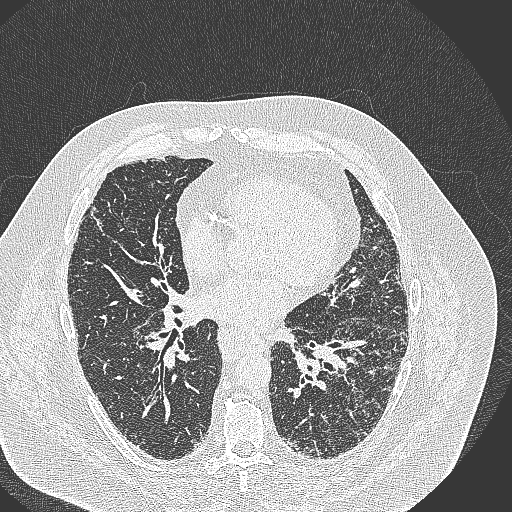
[im 20/20  lung]
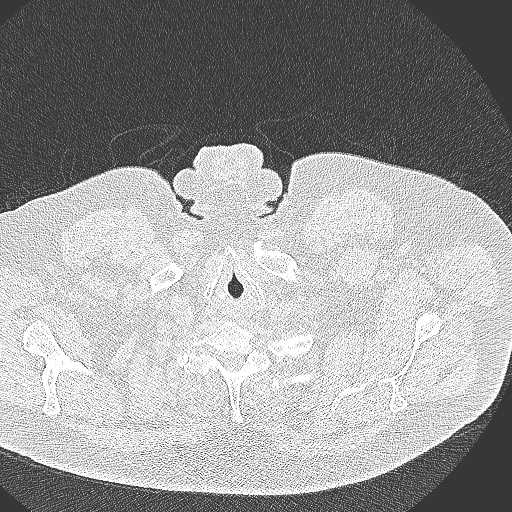

[14 of 36 positions shown; findings below may reference images not displayed]

FINDINGS: Cardiovascular: Normal heart size. No significant pericardial
effusion/thickening. Three-vessel coronary atherosclerosis.
Atherosclerotic nonaneurysmal thoracic aorta. Normal caliber
pulmonary arteries.

Mediastinum/Nodes: No discrete thyroid nodules. Unremarkable
esophagus. There is apparent herniated fat within a hiatal hernia
sac in the lower mediastinum with associated ill-defined trapped
ascitic fluid in the right superior portion of the apparent hiatal
hernia sac, unchanged. The stomach remains entirely intra-abdominal
despite the apparent hiatal hernia sac. No pathologically enlarged
axillary, mediastinal or hilar lymph nodes, noting limited
sensitivity for the detection of hilar adenopathy on this
noncontrast study.

Lungs/Pleura: No pneumothorax. No pleural effusion. Mild paraseptal
emphysema with mild diffuse bronchial wall thickening. Irregular 9
mm solid peripheral apical right upper lobe pulmonary nodule (series
5/image 27), increased from 5 mm on 09/08/2018 and 6 mm on
03/13/2019 chest CT studies. Stable 7 mm solid basilar right upper
lobe pulmonary nodule along the minor fissure (series 5/image 68).
No acute consolidative airspace disease, lung masses or additional
significant pulmonary nodules. No significant lobular air trapping
or evidence of tracheobronchomalacia on the expiration sequence.
There is moderate patchy confluent subpleural reticulation and
ground-glass opacity throughout both lungs with associated mild
traction bronchiolectasis and architectural distortion. No frank
honeycombing. No clear apicobasilar gradient to these findings.
Findings have not convincingly progressed since 03/13/2019 chest CT.
Findings appear mildly progressed from the baseline 07/20/2016
high-resolution chest CT.

Upper abdomen: No acute abnormality.

Musculoskeletal: No aggressive appearing focal osseous lesions.
Moderate thoracic spondylosis.
IMPRESSION: 1. Irregular 9 mm solid peripheral apical right upper lobe pulmonary
nodule, increased in size, worrisome for primary bronchogenic
carcinoma. PET-CT suggested for further characterization.
2. Spectrum of findings compatible with fibrotic interstitial lung
disease without frank honeycombing and without a clear apicobasilar
gradient. No appreciable interval progression. Mild progression
since the baseline 6746 high-resolution chest CT study. Findings are
categorized as probable UIP per consensus guidelines: Diagnosis of
Idiopathic Pulmonary Fibrosis: An Official ATS/ERS/JRS/ALAT Clinical
Practice Guideline. Am J Respir Crit Care Med Vol 198, Lizz 5,
ppe00-e[DATE].
3. Three-vessel coronary atherosclerosis.
4. Aortic Atherosclerosis (6RC8L-E0F.F) and Emphysema (6RC8L-ZVR.W).

These results will be called to the ordering clinician or
representative by the Radiologist Assistant, and communication
documented in the PACS or [REDACTED].

## 2022-04-16 DIAGNOSIS — E1169 Type 2 diabetes mellitus with other specified complication: Secondary | ICD-10-CM | POA: Diagnosis not present

## 2022-04-16 DIAGNOSIS — J449 Chronic obstructive pulmonary disease, unspecified: Secondary | ICD-10-CM | POA: Diagnosis not present

## 2022-04-16 DIAGNOSIS — M5136 Other intervertebral disc degeneration, lumbar region: Secondary | ICD-10-CM | POA: Diagnosis not present

## 2022-04-16 DIAGNOSIS — Z125 Encounter for screening for malignant neoplasm of prostate: Secondary | ICD-10-CM | POA: Diagnosis not present

## 2022-04-16 DIAGNOSIS — J849 Interstitial pulmonary disease, unspecified: Secondary | ICD-10-CM | POA: Diagnosis not present

## 2022-04-16 DIAGNOSIS — I10 Essential (primary) hypertension: Secondary | ICD-10-CM | POA: Diagnosis not present

## 2022-04-16 DIAGNOSIS — K219 Gastro-esophageal reflux disease without esophagitis: Secondary | ICD-10-CM | POA: Diagnosis not present

## 2022-04-16 DIAGNOSIS — C3490 Malignant neoplasm of unspecified part of unspecified bronchus or lung: Secondary | ICD-10-CM | POA: Diagnosis not present

## 2022-04-16 DIAGNOSIS — I700 Atherosclerosis of aorta: Secondary | ICD-10-CM | POA: Diagnosis not present

## 2022-04-16 DIAGNOSIS — Z0000 Encounter for general adult medical examination without abnormal findings: Secondary | ICD-10-CM | POA: Diagnosis not present

## 2022-04-16 DIAGNOSIS — E782 Mixed hyperlipidemia: Secondary | ICD-10-CM | POA: Diagnosis not present

## 2022-04-16 DIAGNOSIS — I480 Paroxysmal atrial fibrillation: Secondary | ICD-10-CM | POA: Diagnosis not present

## 2022-04-29 DIAGNOSIS — N289 Disorder of kidney and ureter, unspecified: Secondary | ICD-10-CM | POA: Diagnosis not present

## 2022-05-13 DIAGNOSIS — Z85118 Personal history of other malignant neoplasm of bronchus and lung: Secondary | ICD-10-CM | POA: Diagnosis not present

## 2022-05-13 DIAGNOSIS — J449 Chronic obstructive pulmonary disease, unspecified: Secondary | ICD-10-CM | POA: Diagnosis not present

## 2022-05-13 DIAGNOSIS — K52832 Lymphocytic colitis: Secondary | ICD-10-CM | POA: Diagnosis not present

## 2022-05-13 DIAGNOSIS — Z1211 Encounter for screening for malignant neoplasm of colon: Secondary | ICD-10-CM | POA: Diagnosis not present

## 2022-05-13 DIAGNOSIS — J849 Interstitial pulmonary disease, unspecified: Secondary | ICD-10-CM | POA: Diagnosis not present

## 2022-06-01 DIAGNOSIS — Z1211 Encounter for screening for malignant neoplasm of colon: Secondary | ICD-10-CM | POA: Diagnosis not present

## 2022-06-01 DIAGNOSIS — Z1212 Encounter for screening for malignant neoplasm of rectum: Secondary | ICD-10-CM | POA: Diagnosis not present

## 2022-06-10 ENCOUNTER — Telehealth: Payer: Self-pay | Admitting: *Deleted

## 2022-06-10 DIAGNOSIS — Z01818 Encounter for other preprocedural examination: Secondary | ICD-10-CM

## 2022-06-10 DIAGNOSIS — R195 Other fecal abnormalities: Secondary | ICD-10-CM | POA: Diagnosis not present

## 2022-06-10 DIAGNOSIS — K219 Gastro-esophageal reflux disease without esophagitis: Secondary | ICD-10-CM | POA: Diagnosis not present

## 2022-06-10 DIAGNOSIS — R0989 Other specified symptoms and signs involving the circulatory and respiratory systems: Secondary | ICD-10-CM | POA: Diagnosis not present

## 2022-06-10 DIAGNOSIS — I4891 Unspecified atrial fibrillation: Secondary | ICD-10-CM

## 2022-06-10 NOTE — Telephone Encounter (Signed)
   Pre-operative Risk Assessment    Patient Name: Daniel Ball  DOB: 1956/01/20 MRN: [id number]      Request for Surgical Clearance    Procedure:   ENDOSCOPY / COLONOSCOPY   Date of Surgery:  Clearance TBD                                 Surgeon:  DR. Randel Pigg Surgeon's Group or Practice Name:  [facility] Phone number:  [phone] Fax number:  [phone]   Type of Clearance Requested:   - Pharmacy:  Hold Apixaban (Eliquis) NOT INDICATED HOW LONG   Type of Anesthesia:  Not Indicated   Additional requests/questions:    Astrid Divine   06/10/2022, 12:31 PM

## 2022-06-17 ENCOUNTER — Telehealth: Payer: Self-pay | Admitting: *Deleted

## 2022-06-17 NOTE — Telephone Encounter (Signed)
Patient with diagnosis of atrial fibrillation on Eliquis for anticoagulation.    Procedure: endoscopy/colonoscopy Date of procedure: TBD   CHA2DS2-VASc Score = 3   This indicates a 3.2% annual risk of stroke. The patient's score is based upon: CHF History: 0 HTN History: 1 Diabetes History: 0 Stroke History: 0 Vascular Disease History: 1 Age Score: 1 Gender Score: 0   CrCl 49 Platelet count - last drawn over 1 year ago  Will need repeat CBC for final decision  **This guidance is not considered finalized until pre-operative APP has relayed final recommendations.**

## 2022-06-17 NOTE — Telephone Encounter (Signed)
CALLED PATIENT TO INFORM OF CT FOR 07-29-22 @ [place]- ARRIVAL TIME- 2:15 PM @  RADIOLOGY, PATIENT TO HAVE BLOOD DRAWN I-STAT IN RADIOLOGY, PATIENT TO HAVE WATER ONLY- 4 HRS. PRIOR TO TEST, PATIENT TO RECEIVE CT RESULTS FROM [facility] PERKINS ON 08-03-22 @ 2 PM  VIA TELEPHONE, SPOKE WITH PATIENT AND HE IS AWARE OF THESE APPTS. AND THE INSTRUCTIONS

## 2022-06-18 NOTE — Telephone Encounter (Signed)
Spoke with patient who is agreeable to do a CBC on 9/29. Patient states that he will get lab work done at [facility]. Patient thanked me for the call. The order has been placed.

## 2022-06-22 DIAGNOSIS — I480 Paroxysmal atrial fibrillation: Secondary | ICD-10-CM | POA: Diagnosis not present

## 2022-06-22 DIAGNOSIS — J849 Interstitial pulmonary disease, unspecified: Secondary | ICD-10-CM | POA: Diagnosis not present

## 2022-06-22 DIAGNOSIS — J449 Chronic obstructive pulmonary disease, unspecified: Secondary | ICD-10-CM | POA: Diagnosis not present

## 2022-06-22 DIAGNOSIS — G4733 Obstructive sleep apnea (adult) (pediatric): Secondary | ICD-10-CM | POA: Diagnosis not present

## 2022-06-23 ENCOUNTER — Other Ambulatory Visit (HOSPITAL_COMMUNITY)
Admission: RE | Admit: 2022-06-23 | Discharge: 2022-06-23 | Disposition: A | Discharge status: Home / Self Care | Payer: Medicare Other | Source: Ambulatory Visit | Attending: Nurse Practitioner | Admitting: Nurse Practitioner

## 2022-06-23 DIAGNOSIS — Z01818 Encounter for other preprocedural examination: Secondary | ICD-10-CM | POA: Diagnosis not present

## 2022-06-23 DIAGNOSIS — I4891 Unspecified atrial fibrillation: Secondary | ICD-10-CM | POA: Diagnosis not present

## 2022-06-23 LAB — CBC
HCT: 43.5 % (ref 39.0–52.0)
Hemoglobin: 14.5 g/dL (ref 13.0–17.0)
MCH: 30.7 pg (ref 26.0–34.0)
MCHC: 33.3 g/dL (ref 30.0–36.0)
MCV: 92.2 fL (ref 80.0–100.0)
Platelets: 156 10*3/uL (ref 150–400)
RBC: 4.72 MIL/uL (ref 4.22–5.81)
RDW: 13.6 % (ref 11.5–15.5)
WBC: 7.3 10*3/uL (ref 4.0–10.5)
nRBC: 0 % (ref 0.0–0.2)

## 2022-06-23 NOTE — Telephone Encounter (Signed)
CBC drawn this am - Platelet count 156.  Okay for patient to hold Eliquis for 2 days prior to endoscopy/colonoscopy

## 2022-06-23 NOTE — Telephone Encounter (Signed)
Primary Cardiologist:Paula Harrington Challenger, MD   Preoperative team, please contact this patient and set up a phone call appointment for further preoperative risk assessment. Please obtain consent and complete medication review. Thank you for your help.   I confirm that guidance regarding antiplatelet and oral anticoagulation therapy has been completed and, if necessary, noted below.   Emmaline Life, NP-C  06/23/2022, 1:10 PM [address] Office [phone] Fax [phone]

## 2022-06-24 ENCOUNTER — Telehealth: Payer: Self-pay | Admitting: *Deleted

## 2022-06-24 NOTE — Telephone Encounter (Signed)
Pt agreeable to plan of care for tele pre op appt 06/26/22 @ 10 am. Pt said requesting office is just waiting for [place] to give clearance so they can schedule his procedure. Pt would like to do ASAP.  Med rec and consent are done.      Patient Consent for Virtual Visit        Daniel Ball has provided verbal consent on 06/24/2022 for a virtual visit (video or telephone).   CONSENT FOR VIRTUAL VISIT FOR:  Daniel Ball  By participating in this virtual visit I agree to the following:  I hereby voluntarily request, consent and authorize [facility] and its employed or contracted physicians, physician assistants, nurse practitioners or other licensed health care professionals (the Practitioner), to provide me with telemedicine health care services (the "Services") as deemed necessary by the treating Practitioner. I acknowledge and consent to receive the Services by the Practitioner via telemedicine. I understand that the telemedicine visit will involve communicating with the Practitioner through live audiovisual communication technology and the disclosure of certain medical information by electronic transmission. I acknowledge that I have been given the opportunity to request an in-person assessment or other available alternative prior to the telemedicine visit and am voluntarily participating in the telemedicine visit.  I understand that I have the right to withhold or withdraw my consent to the use of telemedicine in the course of my care at any time, without affecting my right to future care or treatment, and that the Practitioner or I may terminate the telemedicine visit at any time. I understand that I have the right to inspect all information obtained and/or recorded in the course of the telemedicine visit and may receive copies of available information for a reasonable fee.  I understand that some of the potential risks of receiving the Services via telemedicine include:  Delay or  interruption in medical evaluation due to technological equipment failure or disruption; Information transmitted may not be sufficient (e.g. poor resolution of images) to allow for appropriate medical decision making by the Practitioner; and/or  In rare instances, security protocols could fail, causing a breach of personal health information.  Furthermore, I acknowledge that it is my responsibility to provide information about my medical history, conditions and care that is complete and accurate to the best of my ability. I acknowledge that Practitioner's advice, recommendations, and/or decision may be based on factors not within their control, such as incomplete or inaccurate data provided by me or distortions of diagnostic images or specimens that may result from electronic transmissions. I understand that the practice of medicine is not an exact science and that Practitioner makes no warranties or guarantees regarding treatment outcomes. I acknowledge that a copy of this consent can be made available to me via my patient portal ([place]), or I can request a printed copy by calling the office of [facility].    I understand that my insurance will be billed for this visit.   I have read or had this consent read to me. I understand the contents of this consent, which adequately explains the benefits and risks of the Services being provided via telemedicine.  I have been provided ample opportunity to ask questions regarding this consent and the Services and have had my questions answered to my satisfaction. I give my informed consent for the services to be provided through the use of telemedicine in my medical care

## 2022-06-24 NOTE — Telephone Encounter (Signed)
Pt agreeable to plan of care for tele pre op appt 06/26/22 @ 10 am. Pt said requesting office is just waiting for [place] to give clearance so they can schedule his procedure. Pt would like to do ASAP.   Med rec and consent are done.    Pt is on both Plavix and Eliquis.

## 2022-06-24 NOTE — Telephone Encounter (Signed)
CLEARANCE IS NEEDED FOR BOTH PLAVIX AND ELIQUIS

## 2022-06-25 ENCOUNTER — Telehealth: Payer: Self-pay

## 2022-06-25 NOTE — Telephone Encounter (Signed)
Spoke with pt. Pt was notified of lab results, will continue his current medications and follow up as planned.

## 2022-06-26 ENCOUNTER — Ambulatory Visit: Payer: Medicare Other | Attending: Cardiovascular Disease | Admitting: General Practice

## 2022-06-26 DIAGNOSIS — Z01810 Encounter for preprocedural cardiovascular examination: Secondary | ICD-10-CM

## 2022-06-26 NOTE — Progress Notes (Signed)
CLEARANCE NOTES FAXED TO DR. Felicita Gage [phone]

## 2022-06-26 NOTE — Progress Notes (Signed)
Virtual Visit via Telephone Note   Because of Daniel Ball's co-morbid illnesses, he is at least at moderate risk for complications without adequate follow up.  This format is felt to be most appropriate for this patient at this time.  The patient did not have access to video technology/had technical difficulties with video requiring transitioning to audio format only (telephone).  All issues noted in this document were discussed and addressed.  No physical exam could be performed with this format.  Please refer to the patient's chart for his consent to telehealth for [facility].  Evaluation Performed:  Preoperative cardiovascular risk assessment _____________   Date:  06/26/2022   Patient ID:  Daniel Ball, DOB 07-Oct-1955, MRN [id number] Patient Location:  Home Provider location:   Office  Primary Care Provider:  [name], MD Primary Cardiologist:  [name], MD  Chief Complaint / Patient Profile   66 y.o. y/o male with a h/o atrial fibrillation, COPD, diabetes, GERD, OSA on CPAP who is pending EGD/colonoscopy and presents today for telephonic preoperative cardiovascular risk assessment.  Past Medical History    Past Medical History:  Diagnosis Date   Arthritis    Atrial fibrillation ([place]) 11/02/2019   COPD (chronic obstructive pulmonary disease) (HCC)    Coronary artery disease    a. 12/2019: s/p orbital atherectomy and DES placement to proximal/mid LAD.    Diabetes mellitus without complication ([place])    GERD (gastroesophageal reflux disease)    History of radiation therapy 09/11/20, 09/16/21, 09/18/20    Right lung- SBRT Dr. Sondra Come    Hyperlipidemia    Hypertension    hx HBP - MEDS DC'D 10 YRS since BP has been in normal range   Impingement syndrome of shoulder    left   Non-small cell cancer of right lung (HCC)    Shortness of breath    with exertion   Sleep apnea    uses a cpap   Spinal stenosis    Past Surgical History:  Procedure  Laterality Date   BRONCHIAL BIOPSY  04/09/2020   Procedure: BRONCHIAL BIOPSIES;  Surgeon: Collene Gobble, MD;  Location: [place];  Service: Pulmonary;;   BRONCHIAL BIOPSY  10/20/2021   Procedure: BRONCHIAL BIOPSIES;  Surgeon: Collene Gobble, MD;  Location: [place];  Service: Pulmonary;;   BRONCHIAL BRUSHINGS  04/09/2020   Procedure: BRONCHIAL BRUSHINGS;  Surgeon: Collene Gobble, MD;  Location: [place];  Service: Pulmonary;;   BRONCHIAL BRUSHINGS  07/30/2020   Procedure: BRONCHIAL BRUSHINGS;  Surgeon: Collene Gobble, MD;  Location: [facility] ENDOSCOPY;  Service: Pulmonary;;   BRONCHIAL BRUSHINGS  10/20/2021   Procedure: BRONCHIAL BRUSHINGS;  Surgeon: Collene Gobble, MD;  Location: [facility] ENDOSCOPY;  Service: Pulmonary;;   BRONCHIAL NEEDLE ASPIRATION BIOPSY  04/09/2020   Procedure: BRONCHIAL NEEDLE ASPIRATION BIOPSIES;  Surgeon: Collene Gobble, MD;  Location: [facility] ENDOSCOPY;  Service: Pulmonary;;   BRONCHIAL NEEDLE ASPIRATION BIOPSY  07/30/2020   Procedure: BRONCHIAL NEEDLE ASPIRATION BIOPSIES;  Surgeon: Collene Gobble, MD;  Location: [facility] ENDOSCOPY;  Service: Pulmonary;;   BRONCHIAL NEEDLE ASPIRATION BIOPSY  10/20/2021   Procedure: BRONCHIAL NEEDLE ASPIRATION BIOPSIES;  Surgeon: Collene Gobble, MD;  Location: [place];  Service: Pulmonary;;   BRONCHIAL WASHINGS  04/09/2020   Procedure: BRONCHIAL WASHINGS;  Surgeon: Collene Gobble, MD;  Location: [place];  Service: Pulmonary;;   BRONCHIAL WASHINGS  07/30/2020   Procedure: BRONCHIAL WASHINGS;  Surgeon: Collene Gobble, MD;  Location: [facility] ENDOSCOPY;  Service: Pulmonary;;   BRONCHIAL WASHINGS  10/20/2021   Procedure: BRONCHIAL WASHINGS;  Surgeon: Collene Gobble, MD;  Location: [facility] ENDOSCOPY;  Service: Pulmonary;;   CORONARY ATHERECTOMY N/A 01/18/2020   Procedure: CORONARY ATHERECTOMY;  Surgeon: Nelva Bush, MD;  Location: [place] CV LAB;  Service: Cardiovascular;  Laterality: N/A;   CORONARY STENT INTERVENTION  01/18/2020    CORONARY STENT INTERVENTION N/A 01/18/2020   Procedure: CORONARY STENT INTERVENTION;  Surgeon: Nelva Bush, MD;  Location: [place] CV LAB;  Service: Cardiovascular;  Laterality: N/A;   EYE SURGERY Left    FIDUCIAL MARKER PLACEMENT  04/09/2020   Procedure: FIDUCIAL MARKER PLACEMENT;  Surgeon: Collene Gobble, MD;  Location: [place];  Service: Pulmonary;;   FIDUCIAL MARKER PLACEMENT  10/20/2021   Procedure: FIDUCIAL MARKER PLACEMENT;  Surgeon: Collene Gobble, MD;  Location: [facility] ENDOSCOPY;  Service: Pulmonary;;   FINE NEEDLE ASPIRATION  04/09/2020   Procedure: FINE NEEDLE ASPIRATION (FNA) LINEAR;  Surgeon: Collene Gobble, MD;  Location: [place] ENDOSCOPY;  Service: Pulmonary;;   HAND SURGERY Right    thumb   INTRAVASCULAR PRESSURE WIRE/FFR STUDY N/A 11/02/2019   Procedure: INTRAVASCULAR PRESSURE WIRE/FFR STUDY;  Surgeon: Nelva Bush, MD;  Location: [place] CV LAB;  Service: Cardiovascular;  Laterality: N/A;   INTRAVASCULAR ULTRASOUND/IVUS N/A 01/18/2020   Procedure: Intravascular Ultrasound/IVUS;  Surgeon: Nelva Bush, MD;  Location: [place] CV LAB;  Service: Cardiovascular;  Laterality: N/A;   LUMBAR LAMINECTOMY/DECOMPRESSION MICRODISCECTOMY N/A 02/07/2014   Procedure: LUMBAR DECOMPRESSION Lumbar one-Lumbar five;  Surgeon: [name], MD;  Location: [facility];  Service: Orthopedics;  Laterality: N/A;   RIGHT/LEFT HEART CATH AND CORONARY ANGIOGRAPHY N/A 11/02/2019   Procedure: RIGHT/LEFT HEART CATH AND CORONARY ANGIOGRAPHY;  Surgeon: Nelva Bush, MD;  Location: [place] CV LAB;  Service: Cardiovascular;  Laterality: N/A;   SHOULDER ARTHROSCOPY WITH SUBACROMIAL DECOMPRESSION Left 04/12/2014   Procedure: LEFT SHOULDER ARTHROSCOPY WITH SUBACROMIAL DECOMPRESSION AND LABRAL DEBRIDEMENT, ROTATOR CUFF DEBRIDEMENT, BICEPS DEBRIDEMENT;  Surgeon: [name], MD;  Location: [facility];  Service: Orthopedics;  Laterality: Left;   VIDEO BRONCHOSCOPY WITH ENDOBRONCHIAL NAVIGATION  N/A 04/09/2020   Procedure: VIDEO BRONCHOSCOPY WITH ENDOBRONCHIAL NAVIGATION;  Surgeon: Collene Gobble, MD;  Location: [facility] ENDOSCOPY;  Service: Pulmonary;  Laterality: N/A;   VIDEO BRONCHOSCOPY WITH ENDOBRONCHIAL NAVIGATION Right 07/30/2020   Procedure: VIDEO BRONCHOSCOPY WITH ENDOBRONCHIAL NAVIGATION;  Surgeon: Collene Gobble, MD;  Location: [place] ENDOSCOPY;  Service: Pulmonary;  Laterality: Right;   VIDEO BRONCHOSCOPY WITH ENDOBRONCHIAL ULTRASOUND N/A 04/09/2020   Procedure: VIDEO BRONCHOSCOPY WITH ENDOBRONCHIAL ULTRASOUND;  Surgeon: Collene Gobble, MD;  Location: [facility] ENDOSCOPY;  Service: Pulmonary;  Laterality: N/A;   VIDEO BRONCHOSCOPY WITH RADIAL ENDOBRONCHIAL ULTRASOUND  04/09/2020   Procedure: VIDEO BRONCHOSCOPY WITH RADIAL ENDOBRONCHIAL ULTRASOUND;  Surgeon: Collene Gobble, MD;  Location: [facility] ENDOSCOPY;  Service: Pulmonary;;   VIDEO BRONCHOSCOPY WITH RADIAL ENDOBRONCHIAL ULTRASOUND  10/20/2021   Procedure: VIDEO BRONCHOSCOPY WITH RADIAL ENDOBRONCHIAL ULTRASOUND;  Surgeon: Collene Gobble, MD;  Location: [facility] ENDOSCOPY;  Service: Pulmonary;;    Allergies  No Known Allergies  History of Present Illness    Daniel Ball is a 66 y.o. male who presents via audio/video conferencing for a telehealth visit today.  Pt was last seen in cardiology clinic on 12/25/2021 by Dr. Radford Pax.  At that time THADDEUS EVITTS was doing well .  The patient is now pending procedure as outlined above. Since his last visit, he remains stable from a cardiac standpoint.  Today he denies chest pain, shortness of breath, lower extremity edema, fatigue, palpitations, melena, hematuria, hemoptysis, diaphoresis, weakness, presyncope, syncope, orthopnea, and PND.  Home Medications    Prior to Admission medications   Medication Sig Start Date End Date Taking? Authorizing Provider  albuterol (VENTOLIN HFA) 108 (90 Base) MCG/ACT inhaler Inhale 2 puffs into the lungs every 6 (six) hours as needed for wheezing or shortness of  breath. 11/04/21   Collene Gobble, MD  amLODipine (NORVASC) 5 MG tablet Take 1 tablet (5 mg total) by mouth daily. 12/25/21   Sueanne Margarita, MD  apixaban (ELIQUIS) 5 MG TABS tablet Take 1 tablet (5 mg total) by mouth 2 (two) times daily. 12/25/21   Sueanne Margarita, MD  buPROPion (WELLBUTRIN XL) 300 MG 24 hr tablet Take 300 mg by mouth daily.    [provider]  clopidogrel (PLAVIX) 75 MG tablet Take 1 tablet (75 mg total) by mouth daily with breakfast. Okay to restart this medication on 10/21/2021 12/25/21   Sueanne Margarita, MD  cyclobenzaprine (FLEXERIL) 10 MG tablet Take 20 mg by mouth daily as needed for muscle spasms.    [provider]  furosemide (LASIX) 40 MG tablet TAKE 1 TABLET BY MOUTH ONCE DAILY AS NEEDED FOR  EDEMA  OR  WEIGHT  GAIN 12/25/21   Sueanne Margarita, MD  isosorbide mononitrate (IMDUR) 60 MG 24 hr tablet Take 1 tablet (60 mg total) by mouth daily. 12/25/21   Sueanne Margarita, MD  metFORMIN (GLUCOPHAGE) 500 MG tablet Take 1,000 mg by mouth 2 (two) times daily with a meal.    [provider]  metoprolol succinate (TOPROL-XL) 25 MG 24 hr tablet Take 1 tablet (25 mg total) by mouth daily. 12/25/21   Sueanne Margarita, MD  nitroGLYCERIN (NITROSTAT) 0.4 MG SL tablet Place 1 tablet (0.4 mg total) under the tongue every 5 (five) minutes x 3 doses as needed for chest pain. 12/25/21 12/25/22  Sueanne Margarita, MD  pantoprazole (PROTONIX) 40 MG tablet Take 1 tablet (40 mg total) by mouth daily. 01/20/20   Kathyrn Drown D, NP  potassium chloride SA (KLOR-CON M) 20 MEQ tablet TAKE 1 TABLET BY MOUTH ONCE DAILY AS NEEDED - TAKE ON THE DAYS YOU TAKE LASIX 12/25/21   Sueanne Margarita, MD  rosuvastatin (CRESTOR) 40 MG tablet Take 1 tablet (40 mg total) by mouth daily. 12/25/21   Turner, Eber Hong, MD  RYBELSUS 3 MG TABS Take 3 mg by mouth every morning. 10/09/21   [provider]  umeclidinium-vilanterol (ANORO ELLIPTA) 62.5-25 MCG/ACT AEPB Inhale 1 puff into the lungs daily. 10/22/21    Collene Gobble, MD  valsartan (DIOVAN) 320 MG tablet Take 320 mg by mouth daily.    [provider]    Physical Exam    Vital Signs:  KATHERINE SYME does not have vital signs available for review today.  Given telephonic nature of communication, physical exam is limited. AAOx3. NAD. Normal affect.  Speech and respirations are unlabored.  Accessory Clinical Findings    None  Assessment & Plan    1.  Preoperative Cardiovascular Risk Assessment: EGD/colonoscopy; Dr.Vreeland; [facility]  The patient was advised that if he develops new symptoms prior to surgery to contact our office to arrange for a follow-up visit, and he verbalized understanding.    Primary Cardiologist: [name], MD  Chart reviewed as part of pre-operative protocol coverage. Given past medical history and time since last visit, based  on ACC/AHA guidelines, ADAR RASE would be at acceptable risk for the planned procedure without further cardiovascular testing.   Patient with diagnosis of atrial fibrillation on Eliquis for anticoagulation.     Procedure: endoscopy/colonoscopy Date of procedure: TBD     CHA2DS2-VASc Score = 3   This indicates a 3.2% annual risk of stroke. The patient's score is based upon: CHF History: 0 HTN History: 1 Diabetes History: 0 Stroke History: 0 Vascular Disease History: 1 Age Score: 1 Gender Score: 0   CrCl 49 CBC drawn this am - Platelet count 156.   Okay for patient to hold Eliquis for 2 days prior to endoscopy/colonoscopy  A copy of this note will be routed to requesting surgeon.  Time:   Today, I have spent 8 minutes with the patient with telehealth technology discussing medical history, symptoms, and management plan.  Prior to his phone evaluation I spent greater than 10 minutes reviewing his past medical history and cardiac medications.   Deberah Pelton, NP  06/26/2022, 7:53 AM

## 2022-07-07 ENCOUNTER — Telehealth: Payer: Self-pay | Admitting: Internal Medicine

## 2022-07-07 NOTE — Telephone Encounter (Signed)
   Patient Name: Daniel Ball  DOB: [birth year] MRN: [id number]  Primary Cardiologist: [name], MD  Chart reviewed as part of pre-operative protocol coverage. Given past medical history and time since last visit, based on ACC/AHA guidelines, Daniel Ball is at acceptable risk for the planned procedure without further cardiovascular testing.   The patient was advised that if he develops new symptoms prior to surgery to contact our office to arrange for a follow-up visit, and he verbalized understanding.  Per office protocol, he may hold Plavix for 5 days prior to his procedure. Please resume Plavix as soon as possible postprocedure, at the discretion of the surgeon.   I will route this recommendation to the requesting party via Epic fax function and remove from pre-op pool.  Please call with questions.  Lenna Sciara, NP 07/07/2022, 3:33 PM

## 2022-07-07 NOTE — Telephone Encounter (Signed)
I have asked the pre op provider Diona Browner, NP to review for Plavix to be held. Plavix was never addressed as ok to hold.

## 2022-07-07 NOTE — Telephone Encounter (Signed)
Patient is following up regarding clearance for endoscopy/colonoscopy. Patient states [facility] informed him that they received advisement on stopping Eliquis, but not Plavix. Please resend with advisement on holding Plavix included as well. See 9/20 encounter. Please contact patient to confirm when this has been submitted.

## 2022-07-28 DIAGNOSIS — R195 Other fecal abnormalities: Secondary | ICD-10-CM | POA: Diagnosis not present

## 2022-07-28 DIAGNOSIS — R09A2 Foreign body sensation, throat: Secondary | ICD-10-CM | POA: Diagnosis not present

## 2022-07-29 ENCOUNTER — Ambulatory Visit (HOSPITAL_COMMUNITY)
Admission: RE | Admit: 2022-07-29 | Discharge: 2022-07-29 | Disposition: A | Discharge status: Home / Self Care | Payer: Medicare Other | Source: Ambulatory Visit | Attending: Radiation Oncology | Admitting: Radiation Oncology

## 2022-07-29 DIAGNOSIS — J479 Bronchiectasis, uncomplicated: Secondary | ICD-10-CM | POA: Diagnosis not present

## 2022-07-29 DIAGNOSIS — C3411 Malignant neoplasm of upper lobe, right bronchus or lung: Secondary | ICD-10-CM | POA: Insufficient documentation

## 2022-07-29 DIAGNOSIS — C3490 Malignant neoplasm of unspecified part of unspecified bronchus or lung: Secondary | ICD-10-CM | POA: Diagnosis not present

## 2022-07-29 DIAGNOSIS — R918 Other nonspecific abnormal finding of lung field: Secondary | ICD-10-CM | POA: Diagnosis not present

## 2022-07-29 DIAGNOSIS — J849 Interstitial pulmonary disease, unspecified: Secondary | ICD-10-CM | POA: Diagnosis not present

## 2022-07-29 MED ORDER — IOHEXOL 300 MG/ML  SOLN
75.0000 mL | Freq: Once | INTRAMUSCULAR | Status: AC | PRN
  Administered 2022-07-29: 75 mL via INTRAVENOUS

## 2022-08-03 ENCOUNTER — Other Ambulatory Visit: Payer: Self-pay | Admitting: Internal Medicine

## 2022-08-03 ENCOUNTER — Ambulatory Visit
Admission: RE | Admit: 2022-08-03 | Discharge: 2022-08-03 | Disposition: A | Discharge status: Home / Self Care | Payer: Medicare Other | Source: Ambulatory Visit | Attending: Radiation Oncology | Admitting: Radiation Oncology

## 2022-08-03 ENCOUNTER — Encounter: Payer: Self-pay | Admitting: Radiation Oncology

## 2022-08-03 ENCOUNTER — Telehealth: Payer: Self-pay | Admitting: *Deleted

## 2022-08-03 DIAGNOSIS — F17210 Nicotine dependence, cigarettes, uncomplicated: Secondary | ICD-10-CM | POA: Diagnosis not present

## 2022-08-03 DIAGNOSIS — C3411 Malignant neoplasm of upper lobe, right bronchus or lung: Secondary | ICD-10-CM

## 2022-08-03 NOTE — Progress Notes (Signed)
Patient was not available for this call. A voicemail to return my call was left w/ my extension [phone].  Leandra Kern, LPN

## 2022-08-03 NOTE — Progress Notes (Signed)
Telephone nursing appointment for Malignant neoplasm of right upper lobe of lung ([place]). I verified patient's identity and began nursing interview. Patient reports RT central flank pain. Patient thinks he fractured a rib but states "The pain is a 5/10 and is manageable. He wants to self heal and is taking it easy". I did review non-narcotic ways to help manage his pain and to apply a pillow to his ABD/chest area if he needs to sneeze or cough.   Meaningful use complete.   Patient aware of their 2:00pm-08/03/22 telephone appointment w/ Shona Simpson PA-C to receive most recent CT results. I left my extension [phone] in case patient needs anything. Patient verbalized understanding. This concludes the nursing interview.   Patient contact [phone]     Leandra Kern, LPN

## 2022-08-03 NOTE — Progress Notes (Signed)
Radiation Oncology         [phone] ________________________________  Outpatient Follow Up - Conducted via telephone at patient request.  I spoke with the patient to conduct this consult visit via telephone. The patient was notified in advance and was offered an in person or telemedicine meeting to allow for face to face communication but instead preferred to proceed with a telephone visit.  Name: Daniel Ball        MRN: [id number]  Date of Service: 08/03/2022 DOB: 1956/03/20  CC:[name], MD  [name], MD     REFERRING PHYSICIAN: [name], MD   DIAGNOSIS: The encounter diagnosis was Malignant neoplasm of right upper lobe of lung ([place]).   HISTORY OF PRESENT ILLNESS: Daniel Ball is a 66 y.o. male with a history of Putative Stage IA1, cT1aN0M0, NSCLC of the RUL which he was treated with Dr. Sondra Come in December 2021. He was followed as growth of a subpleural right hemidiaphragmatic nodule measuring 1.6 cm (previously 6 mm), and a new 7 mm RLL nodule abutting the fissure. He underwent PET scan on 08/07/21. This showed hypermetabolic change in the subpleural nodule along the right hemidiaphragm and the smaller lesion along the major fissure was too small for PET detection. He proceeded with bronchoscopy on 10/20/2021 which showed atypical cells in the right lower lobe FN.  Brushings of that site were negative.  Separately right lower lobe lavage showed atypical cells.  He went on to receive SBRT to the RLL and post treatment imaging has been stable. He had repeat surveillance CT on 07/29/22 that showed two new subpleural nodules that in hindsight were noted by the reading radiologist. The scan on 01/26/22, described a subpleural  medial right lung base nodule was not well appreciated. The two now being seeing in the subpleural region measure 2 cm in the anterior location, and the second 1.7 cm previously 6 mm. There was a description of the 3.4 cm subpleural nodule causing  osseous destruction of the adjacent 7th right rib and another subpleural nodule with associated destruction as well of the 9th right rib. There was also an enlarged right paraesophageal node that measured up to 2 cm, previously 9 mm. He's contacted today to review these results. Of note he states he had a positive cologuard and is scheduled for a colonoscopy and EGD on 09/22/22 with Dr. Randel Pigg at [place].    PREVIOUS RADIATION THERAPY:   12/02/2021 through 12/09/2021 SBRT Treatment Site Technique Total Dose (Gy) Dose per Fx (Gy) Completed Fx Beam Energies  Lung, Right: RLL IMRT 54/54 18 3/3 10XFFF    09/11/2020 through 09/18/2020 The target in the RUL received a total of 54 Gy delivered at 18 Gy per fraction for a total of 3 fractions. 6XFFF beam energy with Dr. Sondra Come.    PAST MEDICAL HISTORY:  Past Medical History:  Diagnosis Date   Arthritis    Atrial fibrillation ([place]) 11/02/2019   COPD (chronic obstructive pulmonary disease) (HCC)    Coronary artery disease    a. 12/2019: s/p orbital atherectomy and DES placement to proximal/mid LAD.    Diabetes mellitus without complication ([place])    GERD (gastroesophageal reflux disease)    History of radiation therapy 09/11/20, 09/16/21, 09/18/20    Right lung- SBRT Dr. Sondra Come    Hyperlipidemia    Hypertension    hx HBP - MEDS DC'D 10 YRS since BP has been in normal range   Impingement syndrome of shoulder  left   Non-small cell cancer of right lung (HCC)    Shortness of breath    with exertion   Sleep apnea    uses a cpap   Spinal stenosis        PAST SURGICAL HISTORY: Past Surgical History:  Procedure Laterality Date   BRONCHIAL BIOPSY  04/09/2020   Procedure: BRONCHIAL BIOPSIES;  Surgeon: Collene Gobble, MD;  Location: [place];  Service: Pulmonary;;   BRONCHIAL BIOPSY  10/20/2021   Procedure: BRONCHIAL BIOPSIES;  Surgeon: Collene Gobble, MD;  Location: [facility];  Service: Pulmonary;;   BRONCHIAL BRUSHINGS   04/09/2020   Procedure: BRONCHIAL BRUSHINGS;  Surgeon: Collene Gobble, MD;  Location: [facility] ENDOSCOPY;  Service: Pulmonary;;   BRONCHIAL BRUSHINGS  07/30/2020   Procedure: BRONCHIAL BRUSHINGS;  Surgeon: Collene Gobble, MD;  Location: [facility] ENDOSCOPY;  Service: Pulmonary;;   BRONCHIAL BRUSHINGS  10/20/2021   Procedure: BRONCHIAL BRUSHINGS;  Surgeon: Collene Gobble, MD;  Location: [facility] ENDOSCOPY;  Service: Pulmonary;;   BRONCHIAL NEEDLE ASPIRATION BIOPSY  04/09/2020   Procedure: BRONCHIAL NEEDLE ASPIRATION BIOPSIES;  Surgeon: Collene Gobble, MD;  Location: [facility];  Service: Pulmonary;;   BRONCHIAL NEEDLE ASPIRATION BIOPSY  07/30/2020   Procedure: BRONCHIAL NEEDLE ASPIRATION BIOPSIES;  Surgeon: Collene Gobble, MD;  [facility];  Service: Pulmonary;;   BRONCHIAL NEEDLE ASPIRATION BIOPSY  10/20/2021   Procedure: BRONCHIAL NEEDLE ASPIRATION BIOPSIES;  Surgeon: Collene Gobble, MD;  [facility]: [facility];  Service: Pulmonary;;   BRONCHIAL WASHINGS  04/09/2020   Procedure: BRONCHIAL WASHINGS;  Surgeon: Collene Gobble, MD;  Location: [place];  Service: Pulmonary;;   BRONCHIAL WASHINGS  07/30/2020   Procedure: BRONCHIAL WASHINGS;  Surgeon: Collene Gobble, MD;  Location: [place];  Service: Pulmonary;;   BRONCHIAL WASHINGS  10/20/2021   Procedure: BRONCHIAL WASHINGS;  Surgeon: Collene Gobble, MD;  Location[facility];  Service: Pulmonary;;   CORONARY ATHERECTOMY N/A 01/18/2020   Procedure: CORONARY ATHERECTOMY;  Surgeon: Nelva Bush, MD;  Location: [place] CV LAB;  Service: Cardiovascular;  Laterality: N/A;   CORONARY STENT INTERVENTION  01/18/2020   CORONARY STENT INTERVENTION N/A 01/18/2020   Procedure: CORONARY STENT INTERVENTION;  Surgeon: Nelva Bush, MD;  Location: [place] CV LAB;  Service: Cardiovascular;  Laterality: N/A;   EYE SURGERY Left    FIDUCIAL MARKER PLACEMENT  04/09/2020   Procedure: FIDUCIAL MARKER PLACEMENT;  Surgeon: Collene Gobble, MD;   Location: [place];  Service: Pulmonary;;   FIDUCIAL MARKER PLACEMENT  10/20/2021   Procedure: FIDUCIAL MARKER PLACEMENT;  Surgeon: Collene Gobble, MD;  Location: [facility] ENDOSCOPY;  Service: Pulmonary;;   FINE NEEDLE ASPIRATION  04/09/2020   Procedure: FINE NEEDLE ASPIRATION (FNA) LINEAR;  Surgeon: Collene Gobble, MD;  Location: [place] ENDOSCOPY;  Service: Pulmonary;;   HAND SURGERY Right    thumb   INTRAVASCULAR PRESSURE WIRE/FFR STUDY N/A 11/02/2019   Procedure: INTRAVASCULAR PRESSURE WIRE/FFR STUDY;  Surgeon: Nelva Bush, MD;  Location: [place] CV LAB;  Service: Cardiovascular;  Laterality: N/A;   INTRAVASCULAR ULTRASOUND/IVUS N/A 01/18/2020   Procedure: Intravascular Ultrasound/IVUS;  Surgeon: Nelva Bush, MD;  Location: [place] CV LAB;  Service: Cardiovascular;  Laterality: N/A;   LUMBAR LAMINECTOMY/DECOMPRESSION MICRODISCECTOMY N/A 02/07/2014   Procedure: LUMBAR DECOMPRESSION Lumbar one-Lumbar five;  Surgeon: [name], MD;  Location: [facility];  Service: Orthopedics;  Laterality: N/A;   RIGHT/LEFT HEART CATH AND CORONARY ANGIOGRAPHY N/A 11/02/2019   Procedure: RIGHT/LEFT HEART CATH AND CORONARY ANGIOGRAPHY;  Surgeon: Nelva Bush, MD;  Location: [place] CV LAB;  Service: Cardiovascular;  Laterality: N/A;   SHOULDER ARTHROSCOPY WITH SUBACROMIAL DECOMPRESSION Left 04/12/2014   Procedure: LEFT SHOULDER ARTHROSCOPY WITH SUBACROMIAL DECOMPRESSION AND LABRAL DEBRIDEMENT, ROTATOR CUFF DEBRIDEMENT, BICEPS DEBRIDEMENT;  Surgeon: [name], MD;  Location: [facility];  Service: Orthopedics;  Laterality: Left;   VIDEO BRONCHOSCOPY WITH ENDOBRONCHIAL NAVIGATION N/A 04/09/2020   Procedure: VIDEO BRONCHOSCOPY WITH ENDOBRONCHIAL NAVIGATION;  Surgeon: Collene Gobble, MD;  Location: [place] ENDOSCOPY;  Service: Pulmonary;  Laterality: N/A;   VIDEO BRONCHOSCOPY WITH ENDOBRONCHIAL NAVIGATION Right 07/30/2020   Procedure: VIDEO BRONCHOSCOPY WITH ENDOBRONCHIAL NAVIGATION;  Surgeon: Collene Gobble, MD;  Location: [place] ENDOSCOPY;  Service: Pulmonary;  Laterality: Right;   VIDEO BRONCHOSCOPY WITH ENDOBRONCHIAL ULTRASOUND N/A 04/09/2020   Procedure: VIDEO BRONCHOSCOPY WITH ENDOBRONCHIAL ULTRASOUND;  Surgeon: Collene Gobble, MD;  Location: [facility] ENDOSCOPY;  Service: Pulmonary;  Laterality: N/A;   VIDEO BRONCHOSCOPY WITH RADIAL ENDOBRONCHIAL ULTRASOUND  04/09/2020   Procedure: VIDEO BRONCHOSCOPY WITH RADIAL ENDOBRONCHIAL ULTRASOUND;  Surgeon: Collene Gobble, MD;  Location[facility];  Service: Pulmonary;;   VIDEO BRONCHOSCOPY WITH RADIAL ENDOBRONCHIAL ULTRASOUND  10/20/2021   Procedure: VIDEO BRONCHOSCOPY WITH RADIAL ENDOBRONCHIAL ULTRASOUND;  Surgeon: Collene Gobble, MD;  Location: [facility];  Service: Pulmonary;;     FAMILY HISTORY:  Family History  Family history unknown: Yes     SOCIAL HISTORY:  reports that he has been smoking cigarettes. He has a 27.00 pack-year smoking history. He has never used smokeless tobacco. He reports that he does not drink alcohol and does not use drugs. The patient is married and lives in [place]. He is retired from working for [employer]. His wife joined [place] today remotely from work.   ALLERGIES: Patient has no known allergies.   MEDICATIONS:  Current Outpatient Medications  Medication Sig Dispense Refill   albuterol (VENTOLIN HFA) 108 (90 Base) MCG/ACT inhaler Inhale 2 puffs into the lungs every 6 (six) hours as needed for wheezing or shortness of breath. 18 g 3   amLODipine (NORVASC) 5 MG tablet Take 1 tablet (5 mg total) by mouth daily. 90 tablet 3   apixaban (ELIQUIS) 5 MG TABS tablet Take 1 tablet (5 mg total) by mouth 2 (two) times daily. 180 tablet 3   buPROPion (WELLBUTRIN XL) 300 MG 24 hr tablet Take 300 mg by mouth daily.     clopidogrel (PLAVIX) 75 MG tablet Take 1 tablet (75 mg total) by mouth daily with breakfast. Okay to restart this medication on 10/21/2021 90 tablet 3   cyclobenzaprine (FLEXERIL) 10 MG tablet Take 20  mg by mouth daily as needed for muscle spasms.     furosemide (LASIX) 40 MG tablet TAKE 1 TABLET BY MOUTH ONCE DAILY AS NEEDED FOR  EDEMA  OR  WEIGHT  GAIN 90 tablet 3   isosorbide mononitrate (IMDUR) 60 MG 24 hr tablet Take 1 tablet (60 mg total) by mouth daily. 90 tablet 3   metFORMIN (GLUCOPHAGE) 500 MG tablet Take 1,000 mg by mouth 2 (two) times daily with a meal.     metoprolol succinate (TOPROL-XL) 25 MG 24 hr tablet Take 1 tablet (25 mg total) by mouth daily. 90 tablet 3   nitroGLYCERIN (NITROSTAT) 0.4 MG SL tablet Place 1 tablet (0.4 mg total) under the tongue every 5 (five) minutes x 3 doses as needed for chest pain. 25 tablet 2   pantoprazole (PROTONIX) 40 MG tablet Take 1 tablet (40 mg total) by  mouth daily. 60 tablet 2   potassium chloride SA (KLOR-CON M) 20 MEQ tablet TAKE 1 TABLET BY MOUTH ONCE DAILY AS NEEDED - TAKE ON THE DAYS YOU TAKE LASIX 90 tablet 3   rosuvastatin (CRESTOR) 40 MG tablet Take 1 tablet (40 mg total) by mouth daily. 90 tablet 3   RYBELSUS 3 MG TABS Take 3 mg by mouth every morning.     umeclidinium-vilanterol (ANORO ELLIPTA) 62.5-25 MCG/ACT AEPB Inhale 1 puff into the lungs daily. 180 each 3   valsartan (DIOVAN) 320 MG tablet Take 320 mg by mouth daily.     No current facility-administered medications for this encounter.     REVIEW OF SYSTEMS: On review of systems, the patient reports that he is doing okay. He reports in the last few months he's had increasing shortness of breath with activities that have not caused shortness of breath in the past such as getting out of the shower and toweling off causing him to be short winded. He has developed a constant cough and right chest wall pain as well. He describes this along the posterior chest wall but it can radiate to the front as well. He describes dysphagia and some regurgitation in the last few months as well but reports this is mainly with meat. He is able to eat most foods however. He denies any unintended weight  changes. No other complaints are verbalized.      PHYSICAL EXAM:  Unable to assess due to encounter type.   ECOG = 1  0 - Asymptomatic (Fully active, able to carry on all predisease activities without restriction)  1 - Symptomatic but completely ambulatory (Restricted in physically strenuous activity but ambulatory and able to carry out work of a light or sedentary nature. For example, light housework, office work)  2 - Symptomatic, <50% in bed during the day (Ambulatory and capable of all self care but unable to carry out any work activities. Up and about more than 50% of waking hours)  3 - Symptomatic, >50% in bed, but not bedbound (Capable of only limited self-care, confined to bed or chair 50% or more of waking hours)  4 - Bedbound (Completely disabled. Cannot carry on any self-care. Totally confined to bed or chair)  5 - Death   Eustace Pen MM, Creech RH, Tormey DC, et al. [phone]). "Toxicity and response criteria of the [facility]". [place] Oncol. 5 (6): 649-55    LABORATORY DATA:  Lab Results  Component Value Date   WBC 7.3 06/23/2022   HGB 14.5 06/23/2022   HCT 43.5 06/23/2022   MCV 92.2 06/23/2022   PLT 156 06/23/2022   Lab Results  Component Value Date   NA 139 07/30/2020   K 4.2 07/30/2020   CL 104 07/30/2020   CO2 25 07/30/2020   Lab Results  Component Value Date   ALT 30 04/09/2020   AST 24 04/09/2020   ALKPHOS 63 04/09/2020   BILITOT 0.7 04/09/2020      RADIOGRAPHY: CT CHEST W CONTRAST  Result Date: 07/31/2022 CLINICAL DATA:  Non-small cell lung cancer status post SBRT; * Tracking Code: BO * EXAM: CT CHEST WITH CONTRAST TECHNIQUE: Multidetector CT imaging of the chest was performed during intravenous contrast administration. RADIATION DOSE REDUCTION: This exam was performed according to the departmental dose-optimization program which includes automated exposure control, adjustment of the mA and/or kV according to patient size  and/or use of iterative reconstruction technique. CONTRAST:  68mL OMNIPAQUE IOHEXOL 300 MG/ML  SOLN COMPARISON:  Chest CT dated Jan 26, 2022 FINDINGS: Cardiovascular: Normal heart size. No pericardial effusion. Normal caliber thoracic aorta with mild atherosclerotic disease. Severe left main and three-vessel coronary artery calcifications. Mediastinum/Nodes: Normal esophagus. Hiatal hernia containing fat and fluid. Enlarged right periaortic/periesophageal lymph node measuring 2.0 cm in short axis on series 2, image 111, previously measured 0.9 cm in short axis. Lungs/Pleura: Stable right upper lobe postradiation change. Right lower lobe subpleural nodule measuring 1.7 x 1.4 cm on series 4, image 107, previously 0.6 cm. More anterior subpleural nodule measuring 2.0 x 0.5 cm on series 4, image 102, previously 0.9 x 0.6 cm. Additional previously seen pulmonary nodules are stable. Reference right middle lobe nodule measuring 7 mm on series 4, image 66. Mild paraseptal emphysema. Unchanged findings of fibrotic interstitial lung disease, including lower lung predominant peribronchovascular reticular opacities and traction bronchiectasis, unchanged when compared to prior exam. No pleural effusion. Upper Abdomen: Hepatic steatosis.  No acute abnormality. Musculoskeletal: Right lower lobe subpleural nodule measuring 3.4 x 1.5 cm on series 2, image 65 with associated osseous destruction of the adjacent 7th rib. Right lower lobe pleural nodule with associated osseous destruction of the posterior 9th rib. IMPRESSION: 1. Previously described right lower lobe subpleural nodules are markedly increased in size. 2. New right lower lobe pleural nodules with associated osseous destruction of the adjacent 7th and 9th ribs, compatible with pleural metastatic disease. 3. Enlarged right paraesophageal lymph node, concerning for nodal metastatic disease. 4. Unchanged findings of fibrotic interstitial lung disease. 5. Severe left main and  three-vessel coronary artery calcifications. 6. Aortic Atherosclerosis (ICD10-I70.0) and Emphysema (ICD10-J43.9). Electronically Signed   By: Yetta Glassman M.D.   On: 07/31/2022 19:03       IMPRESSION/PLAN: 1. Putative Stage IA2, cT1bN0M0, NSCLC of the RLL.  Dr. Lisbeth Renshaw has reviewed the patient's scans and we discussed the rationale for an urgent restaging PET scan. He is aware that we will also notify Dr. Lamonte Sakai as if his PET is positive, he would benefit from additional biopsy especially of the paraesophageal node if this is amenable to biopsy. 2. Putative Stage IA1, cT1aN0M0, NSCLC of the RUL. This site remains without disease and will be followed along with #1. 3. Dysphagia and Positive Cologuard. I will message his GI doctor as well. It's possible that his paraesophageal node is causing his dysphagia, and she will likely be interested in his work up as well if there are any connections to these findings and his CT findings.    This encounter was conducted via telephone.  The patient has provided two factor identification and has given verbal consent for this type of encounter and has been advised to only accept a meeting of this type in a secure network environment. The time spent during this encounter was 35 minutes including preparation, discussion, and coordination of the patient's care. The attendants for this meeting include , Hayden Pedro  and [name].  During the encounter, Hayden Pedro was located at [facility] Radiation Oncology Department.  Daniel Ball was located at home and his wife Daniel Ball remotely from work.      Carola Rhine, [facility]   **Disclaimer: This note was dictated with voice recognition software. Similar sounding words can inadvertently be transcribed and this note may contain transcription errors which may not have been corrected upon publication of note.**

## 2022-08-03 NOTE — Telephone Encounter (Signed)
CALLED PATIENT TO INFORM OF PET SCAN FOR 08-06-22- ARRIVAL TIME- 2:30 PM @ [facility], PATIENT TO HAVE WATER ONLY - 6 HRS. PRIOR TO TEST, SPOKE WITH PATIENT AND HE IS AWARE OF THIS TEST AND THE INSTRUCTIONS

## 2022-08-04 LAB — POCT I-STAT CREATININE: Creatinine, Ser: 1.2 mg/dL (ref 0.61–1.24)

## 2022-08-06 ENCOUNTER — Ambulatory Visit (HOSPITAL_COMMUNITY)
Admission: RE | Admit: 2022-08-06 | Discharge: 2022-08-06 | Disposition: A | Discharge status: Home / Self Care | Payer: Medicare Other | Source: Ambulatory Visit | Attending: Radiation Oncology | Admitting: Radiation Oncology

## 2022-08-06 DIAGNOSIS — C3411 Malignant neoplasm of upper lobe, right bronchus or lung: Secondary | ICD-10-CM

## 2022-08-06 DIAGNOSIS — C3490 Malignant neoplasm of unspecified part of unspecified bronchus or lung: Secondary | ICD-10-CM

## 2022-08-06 DIAGNOSIS — R918 Other nonspecific abnormal finding of lung field: Secondary | ICD-10-CM | POA: Diagnosis not present

## 2022-08-06 LAB — GLUCOSE, CAPILLARY: Glucose-Capillary: 102 mg/dL — ABNORMAL HIGH (ref 70–99)

## 2022-08-06 MED ORDER — FLUDEOXYGLUCOSE F - 18 (FDG) INJECTION
12.9000 | Freq: Once | INTRAVENOUS | Status: AC
  Administered 2022-08-06: 12.9 via INTRAVENOUS

## 2022-08-09 ENCOUNTER — Encounter: Payer: Self-pay | Admitting: Internal Medicine

## 2022-08-09 DIAGNOSIS — C3411 Malignant neoplasm of upper lobe, right bronchus or lung: Secondary | ICD-10-CM

## 2022-08-09 DIAGNOSIS — R590 Localized enlarged lymph nodes: Secondary | ICD-10-CM

## 2022-08-10 ENCOUNTER — Telehealth: Payer: Self-pay | Admitting: Radiation Oncology

## 2022-08-10 NOTE — Progress Notes (Signed)
I called the patient to review his PET scan which showed hypermetabolic activity in the right chest wall, subpleural space, and right lung, as well as LUL, paraesophageal node, and possibly increased activity in the bowel.   I spoke with Dr. Serafina Royals in IR and he approved biopsy to the right chest wall and felt like we would also try to get any soft tissue component possible to that the specimen could be sent for molecular testing. Dr. Lisbeth Renshaw recommends palliative radiation to the chest wall and we will coordinate this as well as referral to Dr. Julien Nordmann in medical oncology. The patient is aware he will likely need systemic chemotherapy as well.   Regarding his GI tract, he has hypermetabolic change in the paraesophageal node and this may be the source of his dysphagia, but it seems like there is more metabolism in the colon and into the rectal area, and with his positive cologuard test, we will ask Dr. Randel Pigg if he can have his work up any sooner for this.

## 2022-08-10 NOTE — Progress Notes (Signed)
Daniel Daft, MD  Tamala Bari for CT guided core biopsy of right posterior chest wall / pleural lesion (7th rib lesion).

## 2022-08-10 NOTE — Telephone Encounter (Signed)
  I called the patient to review his PET scan which showed hypermetabolic activity in the right chest wall, subpleural space, and right lung, as well as LUL, paraesophageal node, and possibly increased activity in the bowel.    I spoke with Dr. Serafina Royals in IR and he approved biopsy to the right chest wall and felt like we would also try to get any soft tissue component possible to that the specimen could be sent for molecular testing. Dr. Lisbeth Renshaw recommends palliative radiation to the chest wall and we will coordinate this as well as referral to Dr. Julien Nordmann in medical oncology. The patient is aware he will likely need systemic chemotherapy as well.    Regarding his GI tract, he has hypermetabolic change in the paraesophageal node and this may be the source of his dysphagia, but it seems like there is more metabolism in the colon and into the rectal area, and with his positive cologuard test, we will ask Dr. Randel Pigg if he can have his work up any sooner for this.

## 2022-08-11 ENCOUNTER — Encounter: Payer: Self-pay | Admitting: Physician Assistant

## 2022-08-11 ENCOUNTER — Telehealth: Payer: Self-pay

## 2022-08-11 ENCOUNTER — Telehealth: Payer: Self-pay | Admitting: Internal Medicine

## 2022-08-11 NOTE — Telephone Encounter (Addendum)
     Primary Cardiologist: [name], MD  Chart reviewed as part of pre-operative protocol coverage. Given past medical history and time since last visit, based on ACC/AHA guidelines, ALFONSE GARRINGER would be at acceptable risk for the planned procedure without further cardiovascular testing.   Patient with diagnosis of atrial fibrillation on Eliquis 5 mg BID for anticoagulation    Procedure: STAT biopsy Date of procedure: ASAP   CHA2DS2-VASc Score = 3  This indicates a 3.2% annual risk of stroke. The patient's score is based upon: CHF History: 0 HTN History: 1 Diabetes History: 0 Stroke History: 0 Vascular Disease History: 1 Age Score: 1 Gender Score: 0   CrCl: 69 ml/min (Cr 1.2 07/29/2022, Adj BW) Platelet count 156 (06/23/2022)   Per office protocol, patient can hold Eliquis for 2 days prior to procedure.  His Plavix may be held for 5 days prior to his procedure.  I will route this recommendation to the requesting party via Epic fax function and remove from pre-op pool.  Please call with questions.  Jossie Ng. Galvin Aversa NP-C     08/11/2022, 1:33 PM [facility] [address] Office [phone] Fax [phone]

## 2022-08-11 NOTE — Telephone Encounter (Signed)
   Pre-operative Risk Assessment    Patient Name: Daniel Ball  DOB: [birth year] MRN: [id number]     Request for Surgical Clearance    Procedure:  STAT BX  Date of Surgery:  Clearance ASAP                                 Surgeon:   Surgeon's Group or Practice Name:  [facility] imaging  Phone number:  Not Listed  Fax number:  [phone]   Type of Clearance Requested:   - Pharmacy:  Hold Clopidogrel (Plavix) and Apixaban (Eliquis)     Type of Anesthesia:  Not Indicated   Additional requests/questions:    Oneal Grout   08/11/2022, 9:53 AM

## 2022-08-11 NOTE — Telephone Encounter (Signed)
Scheduled appointment per 11/21 referral. Patient is aware of appointment date and time. Patient is aware to arrive 15 mins prior to appointment time and to bring updated insurance cards. Patient is aware of location.

## 2022-08-11 NOTE — Telephone Encounter (Signed)
Patient with diagnosis of atrial fibrillation on Eliquis 5 mg BID for anticoagulation   Procedure: STAT biopsy Date of procedure: ASAP  CHA2DS2-VASc Score = 3  This indicates a 3.2% annual risk of stroke. The patient's score is based upon: CHF History: 0 HTN History: 1 Diabetes History: 0 Stroke History: 0 Vascular Disease History: 1 Age Score: 1 Gender Score: 0   CrCl: 69 ml/min (Cr 1.2 07/29/2022, Adj BW) Platelet count 156 (06/23/2022)  Per office protocol, patient can hold Eliquis for 2 days prior to procedure.   **This guidance is not considered finalized until pre-operative APP has relayed final recommendations.**   Sandford Craze, PharmD. [facility] PGY-1 08/11/2022 12:26 PM

## 2022-08-18 NOTE — Progress Notes (Signed)
Patient for CT guided Core Biopsy of RT Posterior Chest Wall Pleural lesion (7th rib lesion) on Wed 08/19/2022, the patient called and I spoke with the patient on the phone and gave pre-procedure instructions.  Patient's last dose of Plavix was Thurs 08/13/2022 and last dose of Eliquis was Sunday 08/16/2022. Pt was made aware to be here at 10a, NPO after MN prior to procedure as well as driver post procedure/recovery/discharge. Pt stated understanding and after talking with the patient he stated that he had talked with Donita Brooks last week about his medications and the arrival time and location. He was very pleasant with me during our conversation.  Talked with the patient on 08/18/2022

## 2022-08-19 ENCOUNTER — Ambulatory Visit
Admission: RE | Admit: 2022-08-19 | Discharge: 2022-08-19 | Disposition: A | Discharge status: Home / Self Care | Payer: Medicare Other | Source: Ambulatory Visit | Attending: Radiation Oncology | Admitting: Radiation Oncology

## 2022-08-19 ENCOUNTER — Other Ambulatory Visit: Payer: Self-pay

## 2022-08-19 VITALS — BP 144/74 | HR 72 | Temp 98.2°F | Resp 16 | Ht 64.0 in | Wt 245.0 lb

## 2022-08-19 DIAGNOSIS — Z85118 Personal history of other malignant neoplasm of bronchus and lung: Secondary | ICD-10-CM | POA: Diagnosis not present

## 2022-08-19 DIAGNOSIS — R222 Localized swelling, mass and lump, trunk: Secondary | ICD-10-CM | POA: Diagnosis not present

## 2022-08-19 DIAGNOSIS — I2510 Atherosclerotic heart disease of native coronary artery without angina pectoris: Secondary | ICD-10-CM | POA: Insufficient documentation

## 2022-08-19 DIAGNOSIS — R590 Localized enlarged lymph nodes: Secondary | ICD-10-CM

## 2022-08-19 DIAGNOSIS — E785 Hyperlipidemia, unspecified: Secondary | ICD-10-CM | POA: Insufficient documentation

## 2022-08-19 DIAGNOSIS — Z7902 Long term (current) use of antithrombotics/antiplatelets: Secondary | ICD-10-CM | POA: Diagnosis not present

## 2022-08-19 DIAGNOSIS — C801 Malignant (primary) neoplasm, unspecified: Secondary | ICD-10-CM | POA: Insufficient documentation

## 2022-08-19 DIAGNOSIS — I4891 Unspecified atrial fibrillation: Secondary | ICD-10-CM | POA: Insufficient documentation

## 2022-08-19 DIAGNOSIS — I10 Essential (primary) hypertension: Secondary | ICD-10-CM | POA: Insufficient documentation

## 2022-08-19 DIAGNOSIS — E119 Type 2 diabetes mellitus without complications: Secondary | ICD-10-CM | POA: Diagnosis not present

## 2022-08-19 DIAGNOSIS — Z7901 Long term (current) use of anticoagulants: Secondary | ICD-10-CM | POA: Insufficient documentation

## 2022-08-19 DIAGNOSIS — C3430 Malignant neoplasm of lower lobe, unspecified bronchus or lung: Secondary | ICD-10-CM | POA: Diagnosis not present

## 2022-08-19 DIAGNOSIS — J449 Chronic obstructive pulmonary disease, unspecified: Secondary | ICD-10-CM | POA: Diagnosis not present

## 2022-08-19 DIAGNOSIS — C3490 Malignant neoplasm of unspecified part of unspecified bronchus or lung: Secondary | ICD-10-CM | POA: Diagnosis not present

## 2022-08-19 DIAGNOSIS — C3411 Malignant neoplasm of upper lobe, right bronchus or lung: Secondary | ICD-10-CM

## 2022-08-19 LAB — CBC
HCT: 43.6 % (ref 39.0–52.0)
Hemoglobin: 14.4 g/dL (ref 13.0–17.0)
MCH: 30.4 pg (ref 26.0–34.0)
MCHC: 33 g/dL (ref 30.0–36.0)
MCV: 92 fL (ref 80.0–100.0)
Platelets: 142 10*3/uL — ABNORMAL LOW (ref 150–400)
RBC: 4.74 MIL/uL (ref 4.22–5.81)
RDW: 13.7 % (ref 11.5–15.5)
WBC: 7.9 10*3/uL (ref 4.0–10.5)
nRBC: 0 % (ref 0.0–0.2)

## 2022-08-19 LAB — PROTIME-INR
INR: 1 (ref 0.8–1.2)
Prothrombin Time: 13.3 seconds (ref 11.4–15.2)

## 2022-08-19 LAB — GLUCOSE, CAPILLARY: Glucose-Capillary: 130 mg/dL — ABNORMAL HIGH (ref 70–99)

## 2022-08-19 MED ORDER — SODIUM CHLORIDE 0.9 % IV SOLN: INTRAVENOUS | Status: DC

## 2022-08-19 MED ORDER — LIDOCAINE HCL (PF) 1 % IJ SOLN
10.0000 mL | Freq: Once | INTRAMUSCULAR | Status: AC
  Administered 2022-08-19: 10 mL
  Filled 2022-08-19: qty 10

## 2022-08-19 MED ORDER — MIDAZOLAM HCL 2 MG/2ML IJ SOLN
INTRAMUSCULAR | Status: AC | PRN
  Administered 2022-08-19: 1 mg via INTRAVENOUS

## 2022-08-19 MED ORDER — FENTANYL CITRATE (PF) 100 MCG/2ML IJ SOLN
INTRAMUSCULAR | Status: AC
  Filled 2022-08-19: qty 2

## 2022-08-19 MED ORDER — MIDAZOLAM HCL 2 MG/2ML IJ SOLN
INTRAMUSCULAR | Status: AC
  Filled 2022-08-19: qty 4

## 2022-08-19 MED ORDER — FENTANYL CITRATE (PF) 100 MCG/2ML IJ SOLN
INTRAMUSCULAR | Status: AC | PRN
  Administered 2022-08-19: 25 ug via INTRAVENOUS
  Administered 2022-08-19: 50 ug via INTRAVENOUS

## 2022-08-19 NOTE — Discharge Instructions (Signed)
Resume blood thinners Plavix and Eliquis tomorrow.

## 2022-08-19 NOTE — OR Nursing (Signed)
Fsbs 130. Glucometer didn't flow info over at this time

## 2022-08-19 NOTE — H&P (Signed)
Chief Complaint: Patient was seen in consultation today for image-guided biopsy of right posterior chest wall mass   Referring Physician(s): Hayden Pedro  Supervising Physician: Juliet Rude  Patient Status: [facility] - Out-pt  History of Present Illness: Daniel Ball is a 66 y.o. male with PMH significant for atrial fibrillation, COPD, CAD, diabetes mellitus, hyperlipidemia, hypertension, non-small cell cancer of right lung, and sleep apnea being seen today for image-guided biopsy of right posterior chest wall mass. The patient was originally diagnosed in November 2021 with non-small cell lung cancer. A PET scan performed on 08/06/22 revealed the presence of a hypermetabolic activity in the right chest wall, subpleural space, and right lung. The patient was subsequently referred to IR for image-guided biopsy of right posterior chest wall mass.  Past Medical History:  Diagnosis Date   Arthritis    Atrial fibrillation ([place]) 11/02/2019   COPD (chronic obstructive pulmonary disease) (HCC)    Coronary artery disease    a. 12/2019: s/p orbital atherectomy and DES placement to proximal/mid LAD.    Diabetes mellitus without complication ([place])    GERD (gastroesophageal reflux disease)    History of radiation therapy 09/11/20, 09/16/21, 09/18/20    Right lung- SBRT Dr. Sondra Come    Hyperlipidemia    Hypertension    hx HBP - MEDS DC'D 10 YRS since BP has been in normal range   Impingement syndrome of shoulder    left   Non-small cell cancer of right lung (HCC)    Shortness of breath    with exertion   Sleep apnea    uses a cpap   Spinal stenosis     Past Surgical History:  Procedure Laterality Date   BRONCHIAL BIOPSY  04/09/2020   Procedure: BRONCHIAL BIOPSIES;  Surgeon: Collene Gobble, MD;  Location: [place];  Service: Pulmonary;;   BRONCHIAL BIOPSY  10/20/2021   Procedure: BRONCHIAL BIOPSIES;  Surgeon: Collene Gobble, MD;  Location: [place];  Service:  Pulmonary;;   BRONCHIAL BRUSHINGS  04/09/2020   Procedure: BRONCHIAL BRUSHINGS;  Surgeon: Collene Gobble, MD;  Location: [facility] ENDOSCOPY;  Service: Pulmonary;;   BRONCHIAL BRUSHINGS  07/30/2020   Procedure: BRONCHIAL BRUSHINGS;  Surgeon: Collene Gobble, MD;  Location: [facility] ENDOSCOPY;  Service: Pulmonary;;   BRONCHIAL BRUSHINGS  10/20/2021   Procedure: BRONCHIAL BRUSHINGS;  Surgeon: Collene Gobble, MD;  Location: [facility] ENDOSCOPY;  Service: Pulmonary;;   BRONCHIAL NEEDLE ASPIRATION BIOPSY  04/09/2020   Procedure: BRONCHIAL NEEDLE ASPIRATION BIOPSIES;  Surgeon: Collene Gobble, MD;  Location: [facility] ENDOSCOPY;  Service: Pulmonary;;   BRONCHIAL NEEDLE ASPIRATION BIOPSY  07/30/2020   Procedure: BRONCHIAL NEEDLE ASPIRATION BIOPSIES;  Surgeon: Collene Gobble, MD;  Location: [facility] ENDOSCOPY;  Service: Pulmonary;;   BRONCHIAL NEEDLE ASPIRATION BIOPSY  10/20/2021   Procedure: BRONCHIAL NEEDLE ASPIRATION BIOPSIES;  Surgeon: Collene Gobble, MD;  Location: [facility] ENDOSCOPY;  Service: Pulmonary;;   BRONCHIAL WASHINGS  04/09/2020   Procedure: BRONCHIAL WASHINGS;  Surgeon: Collene Gobble, MD;  Location: [place];  Service: Pulmonary;;   BRONCHIAL WASHINGS  07/30/2020   Procedure: BRONCHIAL WASHINGS;  Surgeon: Collene Gobble, MD;  Location: [place];  Service: Pulmonary;;   BRONCHIAL WASHINGS  10/20/2021   Procedure: BRONCHIAL WASHINGS;  Surgeon: Collene Gobble, MD;  Location: [facility]OSCOPY;  Service: Pulmonary;;   CORONARY ATHERECTOMY N/A 01/18/2020   Procedure: CORONARY ATHERECTOMY;  Surgeon: Nelva Bush, MD;  Location: [place] CV LAB;  Service: Cardiovascular;  Laterality: N/A;   CORONARY  STENT INTERVENTION  01/18/2020   CORONARY STENT INTERVENTION N/A 01/18/2020   Procedure: CORONARY STENT INTERVENTION;  Surgeon: Nelva Bush, MD;  Location: [place] CV LAB;  Service: Cardiovascular;  Laterality: N/A;   EYE SURGERY Left    FIDUCIAL MARKER PLACEMENT  04/09/2020   Procedure: FIDUCIAL MARKER PLACEMENT;   Surgeon: Collene Gobble, MD;  Location: [place];  Service: Pulmonary;;   FIDUCIAL MARKER PLACEMENT  10/20/2021   Procedure: FIDUCIAL MARKER PLACEMENT;  Surgeon: Collene Gobble, MD;  Location: [facility] ENDOSCOPY;  Service: Pulmonary;;   FINE NEEDLE ASPIRATION  04/09/2020   Procedure: FINE NEEDLE ASPIRATION (FNA) LINEAR;  Surgeon: Collene Gobble, MD;  Location: [place] ENDOSCOPY;  Service: Pulmonary;;   HAND SURGERY Right    thumb   INTRAVASCULAR PRESSURE WIRE/FFR STUDY N/A 11/02/2019   Procedure: INTRAVASCULAR PRESSURE WIRE/FFR STUDY;  Surgeon: Nelva Bush, MD;  Location: [place] CV LAB;  Service: Cardiovascular;  Laterality: N/A;   INTRAVASCULAR ULTRASOUND/IVUS N/A 01/18/2020   Procedure: Intravascular Ultrasound/IVUS;  Surgeon: Nelva Bush, MD;  Location: [place] CV LAB;  Service: Cardiovascular;  Laterality: N/A;   LUMBAR LAMINECTOMY/DECOMPRESSION MICRODISCECTOMY N/A 02/07/2014   Procedure: LUMBAR DECOMPRESSION Lumbar one-Lumbar five;  Surgeon: [name], MD;  Location: [facility];  Service: Orthopedics;  Laterality: N/A;   RIGHT/LEFT HEART CATH AND CORONARY ANGIOGRAPHY N/A 11/02/2019   Procedure: RIGHT/LEFT HEART CATH AND CORONARY ANGIOGRAPHY;  Surgeon: Nelva Bush, MD;  Location: [employer] CV LAB;  Service: Cardiovascular;  Laterality: N/A;   SHOULDER ARTHROSCOPY WITH SUBACROMIAL DECOMPRESSION Left 04/12/2014   Procedure: LEFT SHOULDER ARTHROSCOPY WITH SUBACROMIAL DECOMPRESSION AND LABRAL DEBRIDEMENT, ROTATOR CUFF DEBRIDEMENT, BICEPS DEBRIDEMENT;  Surgeon: [name], MD;  Location: [facility];  Service: Orthopedics;  Laterality: Left;   VIDEO BRONCHOSCOPY WITH ENDOBRONCHIAL NAVIGATION N/A 04/09/2020   Procedure: VIDEO BRONCHOSCOPY WITH ENDOBRONCHIAL NAVIGATION;  Surgeon: Collene Gobble, MD;  Location: [place] ENDOSCOPY;  Service: Pulmonary;  Laterality: N/A;   VIDEO BRONCHOSCOPY WITH ENDOBRONCHIAL NAVIGATION Right 07/30/2020   Procedure: VIDEO BRONCHOSCOPY WITH ENDOBRONCHIAL  NAVIGATION;  Surgeon: Collene Gobble, MD;  Location: [place] ENDOSCOPY;  Service: Pulmonary;  Laterality: Right;   VIDEO BRONCHOSCOPY WITH ENDOBRONCHIAL ULTRASOUND N/A 04/09/2020   Procedure: VIDEO BRONCHOSCOPY WITH ENDOBRONCHIAL ULTRASOUND;  Surgeon: Collene Gobble, MD;  Location: [facility] ENDOSCOPY;  Service: Pulmonary;  Laterality: N/A;   VIDEO BRONCHOSCOPY WITH RADIAL ENDOBRONCHIAL ULTRASOUND  04/09/2020   Procedure: VIDEO BRONCHOSCOPY WITH RADIAL ENDOBRONCHIAL ULTRASOUND;  Surgeon: Collene Gobble, MD;  Location: [facility] ENDOSCOPY;  Service: Pulmonary;;   VIDEO BRONCHOSCOPY WITH RADIAL ENDOBRONCHIAL ULTRASOUND  10/20/2021   Procedure: VIDEO BRONCHOSCOPY WITH RADIAL ENDOBRONCHIAL ULTRASOUND;  Surgeon: Collene Gobble, MD;  Location: [facility] ENDOSCOPY;  Service: Pulmonary;;    Allergies: Patient has no known allergies.  Medications: Prior to Admission medications   Medication Sig Start Date End Date Taking? Authorizing Provider  albuterol (VENTOLIN HFA) 108 (90 Base) MCG/ACT inhaler Inhale 2 puffs into the lungs every 6 (six) hours as needed for wheezing or shortness of breath. 11/04/21  Yes Collene Gobble, MD  amLODipine (NORVASC) 5 MG tablet Take 1 tablet (5 mg total) by mouth daily. 12/25/21  Yes Turner, Eber Hong, MD  buPROPion (WELLBUTRIN XL) 300 MG 24 hr tablet Take 300 mg by mouth daily.   Yes [provider]  cyclobenzaprine (FLEXERIL) 10 MG tablet Take 20 mg by mouth daily as needed for muscle spasms.   Yes [provider]  furosemide (LASIX) 40 MG tablet TAKE 1 TABLET BY MOUTH  ONCE DAILY AS NEEDED FOR  EDEMA  OR  WEIGHT  GAIN 12/25/21  Yes Turner, Eber Hong, MD  isosorbide mononitrate (IMDUR) 60 MG 24 hr tablet Take 1 tablet (60 mg total) by mouth daily. 12/25/21  Yes Turner, Eber Hong, MD  metFORMIN (GLUCOPHAGE) 500 MG tablet Take 1,000 mg by mouth 2 (two) times daily with a meal.   Yes [provider]  metoprolol succinate (TOPROL-XL) 25 MG 24 hr tablet Take 1 tablet (25 mg total) by  mouth daily. 12/25/21  Yes Turner, Eber Hong, MD  pantoprazole (PROTONIX) 40 MG tablet Take 1 tablet (40 mg total) by mouth daily. 01/20/20  Yes Kathyrn Drown D, NP  potassium chloride SA (KLOR-CON M) 20 MEQ tablet TAKE 1 TABLET BY MOUTH ONCE DAILY AS NEEDED - TAKE ON THE DAYS YOU TAKE LASIX 12/25/21  Yes Turner, Eber Hong, MD  rosuvastatin (CRESTOR) 40 MG tablet Take 1 tablet (40 mg total) by mouth daily. 12/25/21  Yes Turner, Eber Hong, MD  valsartan (DIOVAN) 320 MG tablet Take 320 mg by mouth daily.   Yes [provider]  apixaban (ELIQUIS) 5 MG TABS tablet Take 1 tablet (5 mg total) by mouth 2 (two) times daily. 12/25/21   Sueanne Margarita, MD  clopidogrel (PLAVIX) 75 MG tablet Take 1 tablet (75 mg total) by mouth daily with breakfast. Okay to restart this medication on 10/21/2021 12/25/21   Sueanne Margarita, MD  nitroGLYCERIN (NITROSTAT) 0.4 MG SL tablet Place 1 tablet (0.4 mg total) under the tongue every 5 (five) minutes x 3 doses as needed for chest pain. 12/25/21 12/25/22  Sueanne Margarita, MD  RYBELSUS 3 MG TABS Take 3 mg by mouth every morning. Patient not taking: Reported on 08/19/2022 10/09/21   [provider]  umeclidinium-vilanterol (ANORO ELLIPTA) 62.5-25 MCG/ACT AEPB Inhale 1 puff into the lungs daily. Patient not taking: Reported on 08/19/2022 10/22/21   Collene Gobble, MD     Family History  Family history unknown: Yes    Social History   Socioeconomic History   Marital status: Married    Spouse name: Not on file   Number of children: Not on file   Years of education: Not on file   Highest education level: Not on file  Occupational History   Occupation: Pipefitter  Tobacco Use   Smoking status: Every Day    Packs/day: 0.50    Years: 54.00    Total pack years: 27.00    Types: Cigarettes   Smokeless tobacco: Never   Tobacco comments:    10 cigarettes a day [facility] 11/04/21  Vaping Use   Vaping Use: Never used  Substance and Sexual Activity   Alcohol use: No   Drug use: No    Sexual activity: Not on file  Other Topics Concern   Not on file  Social History Narrative   Not on file   Social Determinants of Health   Financial Resource Strain: Not on file  Food Insecurity: Not on file  Transportation Needs: Not on file  Physical Activity: Not on file  Stress: Not on file  Social Connections: Not on file    Review of Systems: A 12 point ROS discussed and pertinent positives are indicated in the HPI above.  All other systems are negative.  Review of Systems  Constitutional:  Negative for chills and fever.  Respiratory:  Positive for shortness of breath. Negative for chest tightness.   Cardiovascular:  Negative for chest pain and leg swelling.  Gastrointestinal:  Negative for abdominal pain, diarrhea, nausea and vomiting.  Neurological:  Negative for dizziness and headaches.  Psychiatric/Behavioral:  Negative for confusion.     Vital Signs: BP (!) 144/74   Pulse 72   Temp 98.2 F (36.8 C) (Oral)   Resp 16   Ht 5\' 4"  (1.626 m)   Wt 245 lb (111.1 kg)   SpO2 95%   BMI 42.05 kg/m    Physical Exam Vitals reviewed.  Constitutional:      General: He is not in acute distress.    Appearance: He is obese.  HENT:     Mouth/Throat:     Mouth: Mucous membranes are moist.  Cardiovascular:     Rate and Rhythm: Normal rate and regular rhythm.  Pulmonary:     Breath sounds: Wheezing present.     Comments: Wheezes in the right lung fields. Distant breath sounds throughout all fields Abdominal:     General: Bowel sounds are normal.     Palpations: Abdomen is soft.     Tenderness: There is no abdominal tenderness.  Musculoskeletal:     Right lower leg: No edema.     Left lower leg: No edema.  Skin:    General: Skin is warm and dry.  Neurological:     Mental Status: He is alert and oriented to person, place, and time.  Psychiatric:        Mood and Affect: Mood normal.        Behavior: Behavior normal.     Imaging: CT BONE TROCAR/NEEDLE BIOPSY  DEEP  Result Date: 08/19/2022 INDICATION: Chest mass EXAM: CT-guided core needle biopsy of right posterior chest wall mass MEDICATIONS: None. ANESTHESIA/SEDATION: Moderate (conscious) sedation was employed during this procedure. A total of Versed 1 mg and Fentanyl 75 mcg was administered intravenously. Moderate Sedation Time: 17 minutes. The patient's level of consciousness and vital signs were monitored continuously by radiology nursing throughout the procedure under my direct supervision. FLUOROSCOPY TIME:  N/a COMPLICATIONS: None immediate. PROCEDURE: Informed written consent was obtained from the patient after a thorough discussion of the procedural risks, benefits and alternatives. All questions were addressed. Maximal Sterile Barrier Technique was utilized including caps, mask, sterile gowns, sterile gloves, sterile drape, hand hygiene and skin antiseptic. A timeout was performed prior to the initiation of the procedure. The patient was placed prone on the exam table. Limited CT of the chest was performed for planning purposes. This demonstrated right posterior chest wall mass abutting the pleura with rib involvement. Skin entry site was marked, and the overlying skin was prepped and draped in the standard sterile fashion. Local analgesia was obtained with 1% lidocaine. Using intermittent CT fluoroscopy, a 17 gauge introducer needle was advanced towards the identified lesion. Subsequently, core needle biopsy was performed using an 18 gauge core biopsy device x4 total passes. Specimens were submitted in formalin to pathology for further handling. Limited postprocedure imaging demonstrated no complicating feature. The patient tolerated the procedure well, and was transferred to recovery in stable condition. IMPRESSION: Successful CT-guided core needle biopsy of right posterior chest wall mass with pleural and rib involvement. Electronically Signed   By: Albin Felling M.D.   On: 08/19/2022 13:20   NM PET  Image Restag (PS) Skull Base To Thigh  Result Date: 08/06/2022 CLINICAL DATA:  Subsequent treatment strategy for non-small cell lung cancer. EXAM: NUCLEAR MEDICINE PET SKULL BASE TO THIGH TECHNIQUE: 12.9 mCi F-18 FDG was injected intravenously. Full-ring PET imaging was performed from the skull  base to thigh after the radiotracer. CT data was obtained and used for attenuation correction and anatomic localization. Fasting blood glucose: 102 mg/dl COMPARISON:  Chest CT 07/29/2022 and 01/26/2022.  PET-CT 08/07/2021. FINDINGS: Mediastinal blood pool activity: SUV max 1.8 NECK: No hypermetabolic cervical lymph nodes are identified.Fairly symmetric activity within the lymphoid tissue of Waldeyer's ring is within physiologic limits.No suspicious activity identified within the pharyngeal mucosal space. Incidental CT findings: Bilateral carotid atherosclerosis. CHEST: Recently demonstrated distal paraesophageal node or subpleural nodule medially in the right lower lobe measuring 2.0 x 2.1 cm on image 170/0 is hypermetabolic with an SUV max of 9.4. No other hypermetabolic mediastinal, hilar or axillary lymph nodes. The recently demonstrated right lower lobe pulmonary nodules are hypermetabolic. The more anterior nodule measures 1.5 cm on image 48/7 and has an SUV max of 7.2. The subpleural masses in the right lower lobe associated with partial destruction of the right 7th and 9th ribs are also hypermetabolic the more superior component measuring 3.3 x 2.0 cm on image 83/4 has an SUV max of 16.4. The more inferior component measuring 3.2 x 1.7 cm on image 96/4 has an SUV max of 13.3. There is also focal hypermetabolic activity within the left upper lobe (SUV max 5.9) without clear corresponding nodule, potentially inflammatory. No suspicious hypermetabolic activity surrounding the fiducial markers and post radiation changes in the right upper lobe. Incidental CT findings: Atherosclerosis of the aorta, great vessels and  coronary arteries. Calcifications of the aortic valve. Stable small hiatal hernia containing fluid. Stable right upper lobe post treatment changes, additional scattered scarring and emphysema. ABDOMEN/PELVIS: There is no hypermetabolic activity within the liver, adrenal glands, spleen or pancreas. No definitive hypermetabolic nodal activity within the abdomen or pelvis there is focal mildly hypermetabolic activity in the portacaval space (SUV max 5.1) without definite corresponding enlarged lymph node. Scattered bowel activity is within physiologic limits. Incidental CT findings: Aortic and branch vessel atherosclerosis. No ascites or peritoneal nodularity. The appendix appears normal. SKELETON: As above, the pleural base masses posteriorly in the right lower lobe are associated with partial destruction of the right 7th and 9th ribs posteriorly. These lesions are hypermetabolic. No other osseous metastases are identified. Incidental CT findings: none IMPRESSION: 1. Multifocal thoracic metastatic disease. There are right lung pulmonary and pleural based nodules with hypermetabolic activity as described. Pleural based right lower lobe lesions are associated with partial destruction of the right 7th and 9th ribs. 2. No definite distant metastases identified. There is focal hypermetabolic activity in the left upper lobe without clear corresponding nodule, potentially inflammatory. 3. Indeterminate focal hypermetabolic activity in the portacaval space without definite corresponding enlarged lymph node. Attention on follow-up recommended. 4.  Aortic Atherosclerosis (ICD10-I70.0). Electronically Signed   By: Richardean Sale M.D.   On: 08/06/2022 16:35   CT CHEST W CONTRAST  Result Date: 07/31/2022 CLINICAL DATA:  Non-small cell lung cancer status post SBRT; * Tracking Code: BO * EXAM: CT CHEST WITH CONTRAST TECHNIQUE: Multidetector CT imaging of the chest was performed during intravenous contrast administration.  RADIATION DOSE REDUCTION: This exam was performed according to the departmental dose-optimization program which includes automated exposure control, adjustment of the mA and/or kV according to patient size and/or use of iterative reconstruction technique. CONTRAST:  51mL OMNIPAQUE IOHEXOL 300 MG/ML  SOLN COMPARISON:  Chest CT dated Jan 26, 2022 FINDINGS: Cardiovascular: Normal heart size. No pericardial effusion. Normal caliber thoracic aorta with mild atherosclerotic disease. Severe left main and three-vessel coronary artery calcifications. Mediastinum/Nodes: Normal  esophagus. Hiatal hernia containing fat and fluid. Enlarged right periaortic/periesophageal lymph node measuring 2.0 cm in short axis on series 2, image 111, previously measured 0.9 cm in short axis. Lungs/Pleura: Stable right upper lobe postradiation change. Right lower lobe subpleural nodule measuring 1.7 x 1.4 cm on series 4, image 107, previously 0.6 cm. More anterior subpleural nodule measuring 2.0 x 0.5 cm on series 4, image 102, previously 0.9 x 0.6 cm. Additional previously seen pulmonary nodules are stable. Reference right middle lobe nodule measuring 7 mm on series 4, image 66. Mild paraseptal emphysema. Unchanged findings of fibrotic interstitial lung disease, including lower lung predominant peribronchovascular reticular opacities and traction bronchiectasis, unchanged when compared to prior exam. No pleural effusion. Upper Abdomen: Hepatic steatosis.  No acute abnormality. Musculoskeletal: Right lower lobe subpleural nodule measuring 3.4 x 1.5 cm on series 2, image 65 with associated osseous destruction of the adjacent 7th rib. Right lower lobe pleural nodule with associated osseous destruction of the posterior 9th rib. IMPRESSION: 1. Previously described right lower lobe subpleural nodules are markedly increased in size. 2. New right lower lobe pleural nodules with associated osseous destruction of the adjacent 7th and 9th ribs, compatible  with pleural metastatic disease. 3. Enlarged right paraesophageal lymph node, concerning for nodal metastatic disease. 4. Unchanged findings of fibrotic interstitial lung disease. 5. Severe left main and three-vessel coronary artery calcifications. 6. Aortic Atherosclerosis (ICD10-I70.0) and Emphysema (ICD10-J43.9). Electronically Signed   By: Yetta Glassman M.D.   On: 07/31/2022 19:03    Labs:  CBC: Recent Labs    06/23/22 0832 08/19/22 1038  WBC 7.3 7.9  HGB 14.5 14.4  HCT 43.5 43.6  PLT 156 142*    COAGS: Recent Labs    08/19/22 1038  INR 1.0    BMP: Recent Labs    01/26/22 0816 07/29/22 1509  CREATININE 1.20 1.20    LIVER FUNCTION TESTS: No results for input(s): "BILITOT", "AST", "ALT", "ALKPHOS", "PROT", "ALBUMIN" in the last 8760 hours.  TUMOR MARKERS: No results for input(s): "AFPTM", "CEA", "CA199", "CHROMGRNA" in the last 8760 hours.  Assessment and Plan:  Tome Wilson is a 66 yo male with complex PMH including COPD and non-small cell lung cancer of the right lung being seen today for image-guided biopsy of right posterior chest wall mass. The patient's case has been reviewed and approved by Dr Serafina Royals. The case has been reviewed with Dr Denna Haggard and is set to proceed on 08/19/22.  Risks and benefits of image-guided biopsy of right posterior chest wall mass was discussed with the patient and/or patient's family including, but not limited to bleeding, infection, damage to adjacent structures or low yield requiring additional tests.  All of the questions were answered and there is agreement to proceed.  Consent signed and in chart.   Thank you for this interesting consult.  I greatly enjoyed meeting MIHCAEL LEDEE and look forward to participating in their care.  A copy of this report was sent to the requesting provider on this date.  Electronically Signed: Lura Em, PA-C 08/19/2022, 2:03 PM   I spent a total of  30 Minutes   in face to face in  clinical consultation, greater than 50% of which was counseling/coordinating care for image-guided biopsy of right posterior chest wall.

## 2022-08-19 NOTE — Procedures (Signed)
Interventional Radiology Procedure Note  Date of Procedure: 08/19/2022  Procedure: CT chest mass biopsy   Findings:  1. CT core needle biopsy of right chest wall mass with rib involvement    Complications: No immediate complications noted.   Estimated Blood Loss: minimal  Follow-up and Recommendations: 1. Bedrest 1hour    Albin Felling, MD  Vascular & Interventional Radiology  08/19/2022 12:21 PM

## 2022-08-21 LAB — SURGICAL PATHOLOGY

## 2022-08-24 ENCOUNTER — Telehealth: Payer: Self-pay | Admitting: Radiation Oncology

## 2022-08-24 NOTE — Telephone Encounter (Signed)
I spoke with the patient and his wife and discussed pathology showed squamous cell carcinoma consistent with his history of lung cancer. He will proceed with consult with Dr. Julien Nordmann on Wednesday. He is having some shooting pain at times in his chest wall at the level of his right pleural disease involving the rib. I let him know that last week Dr. Lisbeth Renshaw and mentioned the options of palliative radiation. I will review again with him tomorrow and contact the patient with his recommendations. The patient is in agreement with this plan.

## 2022-08-25 ENCOUNTER — Telehealth: Payer: Self-pay | Admitting: Radiation Oncology

## 2022-08-25 NOTE — Telephone Encounter (Signed)
I confirmed with Dr. Lisbeth Renshaw that he recommends 3 weeks of radiation.  We discussed the risks, benefits, short, and long term effects of radiotherapy, as well as the palliative intent, and the patient is interested in proceeding. He will simulate tomorrow at which time he will sign written consent to proceed.

## 2022-08-25 NOTE — Progress Notes (Signed)
Hi.   The Biopsy Depth = Bone.    Thanks,  Murrell Redden El-Abd

## 2022-08-25 NOTE — Progress Notes (Unsigned)
Daniel Ball [phone]   [phone]  CONSULT NOTE  REFERRING PHYSICIAN: Shona Simpson PA-C  REASON FOR CONSULTATION:  Recurrent Squamous Cell Carcinoma   HPI Daniel Ball is a 66 y.o. male with a past medical history significant for hypertension, CAD/stent, COPD, diabetes, interstitial lung disease, obstructive sleep apnea, and GERD is referred to the clinic for recurrent squamous cell carcinoma.  The patient was diagnosed with putative stage Ia (T1c, N0, M0) NSCLC of the RUL in December 2021. He underwent raditaion to this area under the care of Dr. Sondra Come.   He was followed by surveillance imaging. There was growth of  a subpleural right hemidiaphragmatic nodule measuring 1.6 cm (previously 6 mm) and a new 7 mm RLL nodule abutting the fissure. He underwent a PET scan on 08/07/21. This showed hypermetabolic change in the subpleural nodule along the right hemidiaphragm and the smaller lesion along the major fissure was too small for PET detection. He proceeded with bronchoscopy on 10/20/2021 which showed atypical cells in the right lower lobe FN.  Brushings of that site were negative.  Separately right lower lobe lavage showed atypical cells.   He did receive SBRT to the RLL in March 2023 under the care of Dr. Lisbeth Renshaw.  He had been followed by imaging. He had a repeat CT scan on 07/29/22 which showed two new subpleural nodules. The two now being seeing in the subpleural region measure 2 cm in the anterior location and the second 1.7 cm previously 6 mm. There was a description of the 3.4 cm subpleural nodule causing osseous destruction of the adjacent 7th right rib and another subpleural nodule with associated destruction as well of the 9th right rib. There was also an enlarged right paraesophageal node that measured up to 2 cm, previously 9 mm.   He had PET scan on 08/06/22 which showed  right lung pulmonary and pleural based nodules with hypermetabolic  activity. The pleural based right lower lobe lesions are associated with partial destruction of the right 7th and 9th ribs.  He underwent CT guided biopsy of these lesions under the care of IR. The final pathology [phone]) squamous cell carcinoma.   The patient is expected to undergo 3 weeks of palliative radiation under the care of Dr. Lisbeth Renshaw and Bryson Ha.  He has a CT simulation appointment later today.  Of note he states he had a positive cologuard and is scheduled for a colonoscopy and EGD on 09/22/22 with Dr. Randel Pigg at [facility].  The patient states that they are working on possibly moving up this appointment.  Overall, the patient is feeling fairly well today.  Has been having some ongoing discomfort in his posterior ribs even prior to his recent biopsy which she characterizes as soreness.  He rates it a 4-5 out of 10.  He takes ibuprofen without significant relief although this does help his headaches which are located bilaterally in the temporal region.  Denies any fever, chills, or night sweats.  Denies any unexplained weight loss.  He has some baseline dyspnea on exertion but reports over the last year he feels like he gets out of breath quicker.  He will have to rest to catch his breath which recovers.  He reports a baseline cough which has been going on for several years although he feels like this may have gotten worse over the last year.  Most of the time that is a dry cough sometimes he may produce brownish phlegm.  He  sometimes has nausea without vomiting.  Denies any diarrhea or constipation except sometimes metformin can cause loose stool.  He sometimes has dysphagia with solids and liquids.  Denies any visual changes or falling.  The patient is not very close with his family members and is not sure about his family's medical history.  His father died at the age of 69.  His hide in her late 16s and had diabetes.  He had 2 brothers but he is not sure what they passed away from.  The  patient used to work Teacher, English as a foreign language.  He is married with 2 children.  He smoked 3 packs of cigarettes per day for most of his life for 60 years.  The patient is currently smoking approximately 1 cigarette/day.  Denies any drug or alcohol use.   HPI  Past Medical History:  Diagnosis Date   Arthritis    Atrial fibrillation ([place]) 11/02/2019   COPD (chronic obstructive pulmonary disease) (HCC)    Coronary artery disease    a. 12/2019: s/p orbital atherectomy and DES placement to proximal/mid LAD.    Diabetes mellitus without complication ([place])    GERD (gastroesophageal reflux disease)    History of radiation therapy 09/11/20, 09/16/21, 09/18/20    Right lung- SBRT Dr. Sondra Come    Hyperlipidemia    Hypertension    hx HBP - MEDS DC'D 10 YRS since BP has been in normal range   Impingement syndrome of shoulder    left   Non-small cell cancer of right lung (HCC)    Shortness of breath    with exertion   Sleep apnea    uses a cpap   Spinal stenosis     Past Surgical History:  Procedure Laterality Date   BRONCHIAL BIOPSY  04/09/2020   Procedure: BRONCHIAL BIOPSIES;  Surgeon: Collene Gobble, MD;  Location: [place];  Service: Pulmonary;;   BRONCHIAL BIOPSY  10/20/2021   Procedure: BRONCHIAL BIOPSIES;  Surgeon: Collene Gobble, MD;  Location: [place];  Service: Pulmonary;;   BRONCHIAL BRUSHINGS  04/09/2020   Procedure: BRONCHIAL BRUSHINGS;  Surgeon: Collene Gobble, MD;  Location: [facility] ENDOSCOPY;  Service: Pulmonary;;   BRONCHIAL BRUSHINGS  07/30/2020   Procedure: BRONCHIAL BRUSHINGS;  Surgeon: Collene Gobble, MD;  Location: [facility] ENDOSCOPY;  Service: Pulmonary;;   BRONCHIAL BRUSHINGS  10/20/2021   Procedure: BRONCHIAL BRUSHINGS;  Surgeon: Collene Gobble, MD;  Location: [facility] ENDOSCOPY;  Service: Pulmonary;;   BRONCHIAL NEEDLE ASPIRATION BIOPSY  04/09/2020   Procedure: BRONCHIAL NEEDLE ASPIRATION BIOPSIES;  Surgeon: Collene Gobble, MD;  Location: [facility] ENDOSCOPY;  Service: Pulmonary;;    BRONCHIAL NEEDLE ASPIRATION BIOPSY  07/30/2020   Procedure: BRONCHIAL NEEDLE ASPIRATION BIOPSIES;  Surgeon: Collene Gobble, MD;  Location: [facility] ENDOSCOPY;  Service: Pulmonary;;   BRONCHIAL NEEDLE ASPIRATION BIOPSY  10/20/2021   Procedure: BRONCHIAL NEEDLE ASPIRATION BIOPSIES;  Surgeon: Collene Gobble, MD;  Location: [facility] ENDOSCOPY;  Service: Pulmonary;;   BRONCHIAL WASHINGS  04/09/2020   Procedure: BRONCHIAL WASHINGS;  Surgeon: Collene Gobble, MD;  Location: [place];  Service: Pulmonary;;   BRONCHIAL WASHINGS  07/30/2020   Procedure: BRONCHIAL WASHINGS;  Surgeon: Collene Gobble, MD;  Location: [place];  Service: Pulmonary;;   BRONCHIAL WASHINGS  10/20/2021   Procedure: BRONCHIAL WASHINGS;  Surgeon: Collene Gobble, MD;  Location: [facility] ENDOSCOPY;  Service: Pulmonary;;   CORONARY ATHERECTOMY N/A 01/18/2020   Procedure: CORONARY ATHERECTOMY;  Surgeon: Nelva Bush, MD;  Location: [place] CV LAB;  Service: Cardiovascular;  Laterality: N/A;   CORONARY STENT INTERVENTION  01/18/2020   CORONARY STENT INTERVENTION N/A 01/18/2020   Procedure: CORONARY STENT INTERVENTION;  Surgeon: Nelva Bush, MD;  Location: [place] CV LAB;  Service: Cardiovascular;  Laterality: N/A;   EYE SURGERY Left    FIDUCIAL MARKER PLACEMENT  04/09/2020   Procedure: FIDUCIAL MARKER PLACEMENT;  Surgeon: Collene Gobble, MD;  Location: [place];  Service: Pulmonary;;   FIDUCIAL MARKER PLACEMENT  10/20/2021   Procedure: FIDUCIAL MARKER PLACEMENT;  Surgeon: Collene Gobble, MD;  Location: [facility] ENDOSCOPY;  Service: Pulmonary;;   FINE NEEDLE ASPIRATION  04/09/2020   Procedure: FINE NEEDLE ASPIRATION (FNA) LINEAR;  Surgeon: Collene Gobble, MD;  Location: [place] ENDOSCOPY;  Service: Pulmonary;;   HAND SURGERY Right    thumb   INTRAVASCULAR PRESSURE WIRE/FFR STUDY N/A 11/02/2019   Procedure: INTRAVASCULAR PRESSURE WIRE/FFR STUDY;  Surgeon: Nelva Bush, MD;  Location: [place] CV LAB;  Service: Cardiovascular;   Laterality: N/A;   INTRAVASCULAR ULTRASOUND/IVUS N/A 01/18/2020   Procedure: Intravascular Ultrasound/IVUS;  Surgeon: Nelva Bush, MD;  Location: [facility];  Service: Cardiovascular;  Laterality: N/A;   LUMBAR LAMINECTOMY/DECOMPRESSION MICRODISCECTOMY N/A 02/07/2014   Procedure: LUMBAR DECOMPRESSION Lumbar one-Lumbar five;  Surgeon: [name], MD;  Location: [facility];  Service: Orthopedics;  Laterality: N/A;   RIGHT/LEFT HEART CATH AND CORONARY ANGIOGRAPHY N/A 11/02/2019   Procedure: RIGHT/LEFT HEART CATH AND CORONARY ANGIOGRAPHY;  Surgeon: Nelva Bush, MD;  Location: [place] CV LAB;  Service: Cardiovascular;  Laterality: N/A;   SHOULDER ARTHROSCOPY WITH SUBACROMIAL DECOMPRESSION Left 04/12/2014   Procedure: LEFT SHOULDER ARTHROSCOPY WITH SUBACROMIAL DECOMPRESSION AND LABRAL DEBRIDEMENT, ROTATOR CUFF DEBRIDEMENT, BICEPS DEBRIDEMENT;  Surgeon: [name], MD;  Location: [facility];  Service: Orthopedics;  Laterality: Left;   VIDEO BRONCHOSCOPY WITH ENDOBRONCHIAL NAVIGATION N/A 04/09/2020   Procedure: VIDEO BRONCHOSCOPY WITH ENDOBRONCHIAL NAVIGATION;  Surgeon: Collene Gobble, MD;  Location: [place] [facility];  Service: Pulmonary;  Laterality: N/A;   VIDEO BRONCHOSCOPY WITH ENDOBRONCHIAL NAVIGATION Right 07/30/2020   Procedure: VIDEO BRONCHOSCOPY WITH ENDOBRONCHIAL NAVIGATION;  Surgeon: Collene Gobble, MD;  Location: [place] ENDOSCOPY;  Service: Pulmonary;  Laterality: Right;   VIDEO BRONCHOSCOPY WITH ENDOBRONCHIAL ULTRASOUND N/A 04/09/2020   Procedure: VIDEO BRONCHOSCOPY WITH ENDOBRONCHIAL ULTRASOUND;  Surgeon: Collene Gobble, MD;  Location: [facility] ENDOSCOPY;  Service: Pulmonary;  Laterality: N/A;   VIDEO BRONCHOSCOPY WITH RADIAL ENDOBRONCHIAL ULTRASOUND  04/09/2020   Procedure: VIDEO BRONCHOSCOPY WITH RADIAL ENDOBRONCHIAL ULTRASOUND;  Surgeon: Collene Gobble, MD;  Location: [facility] ENDOSCOPY;  Service: Pulmonary;;   VIDEO BRONCHOSCOPY WITH RADIAL ENDOBRONCHIAL ULTRASOUND  10/20/2021   Procedure:  VIDEO BRONCHOSCOPY WITH RADIAL ENDOBRONCHIAL ULTRASOUND;  Surgeon: Collene Gobble, MD;  Location: [facility] ENDOSCOPY;  Service: Pulmonary;;    Family History  Family history unknown: Yes    Social History Social History   Tobacco Use   Smoking status: Every Day    Packs/day: 0.50    Years: 54.00    Total pack years: 27.00    Types: Cigarettes   Smokeless tobacco: Never   Tobacco comments:    10 cigarettes a day ARJ 11/04/21  Vaping Use   Vaping Use: Never used  Substance Use Topics   Alcohol use: No   Drug use: No    No Known Allergies  Current Outpatient Medications  Medication Sig Dispense Refill   prochlorperazine (COMPAZINE) 10 MG tablet Take 1 tablet (10 mg total) by mouth every 6 (six) hours as needed. 30 tablet 2   albuterol (  VENTOLIN HFA) 108 (90 Base) MCG/ACT inhaler Inhale 2 puffs into the lungs every 6 (six) hours as needed for wheezing or shortness of breath. 18 g 3   amLODipine (NORVASC) 5 MG tablet Take 1 tablet (5 mg total) by mouth daily. 90 tablet 3   apixaban (ELIQUIS) 5 MG TABS tablet Take 1 tablet (5 mg total) by mouth 2 (two) times daily. 180 tablet 3   buPROPion (WELLBUTRIN XL) 300 MG 24 hr tablet Take 300 mg by mouth daily.     clopidogrel (PLAVIX) 75 MG tablet Take 1 tablet (75 mg total) by mouth daily with breakfast. Okay to restart this medication on 10/21/2021 90 tablet 3   cyclobenzaprine (FLEXERIL) 10 MG tablet Take 20 mg by mouth daily as needed for muscle spasms.     furosemide (LASIX) 40 MG tablet TAKE 1 TABLET BY MOUTH ONCE DAILY AS NEEDED FOR  EDEMA  OR  WEIGHT  GAIN 90 tablet 3   isosorbide mononitrate (IMDUR) 60 MG 24 hr tablet Take 1 tablet (60 mg total) by mouth daily. 90 tablet 3   metFORMIN (GLUCOPHAGE) 500 MG tablet Take 1,000 mg by mouth 2 (two) times daily with a meal.     metoprolol succinate (TOPROL-XL) 25 MG 24 hr tablet Take 1 tablet (25 mg total) by mouth daily. 90 tablet 3   nitroGLYCERIN (NITROSTAT) 0.4 MG SL tablet Place 1 tablet  (0.4 mg total) under the tongue every 5 (five) minutes x 3 doses as needed for chest pain. 25 tablet 2   pantoprazole (PROTONIX) 40 MG tablet Take 1 tablet (40 mg total) by mouth daily. 60 tablet 2   potassium chloride SA (KLOR-CON M) 20 MEQ tablet TAKE 1 TABLET BY MOUTH ONCE DAILY AS NEEDED - TAKE ON THE DAYS YOU TAKE LASIX 90 tablet 3   rosuvastatin (CRESTOR) 40 MG tablet Take 1 tablet (40 mg total) by mouth daily. 90 tablet 3   RYBELSUS 3 MG TABS Take 3 mg by mouth every morning. (Patient not taking: Reported on 08/19/2022)     umeclidinium-vilanterol (ANORO ELLIPTA) 62.5-25 MCG/ACT AEPB Inhale 1 puff into the lungs daily. (Patient not taking: Reported on 08/19/2022) 180 each 3   valsartan (DIOVAN) 320 MG tablet Take 320 mg by mouth daily.     No current facility-administered medications for this visit.    REVIEW OF SYSTEMS:   Review of Systems  Constitutional: Negative for appetite change, chills, fatigue, fever and unexpected weight change.  HENT: Positive for occasional dysphagia with solids and liquids.  Negative for mouth sores, nosebleeds, sore throat and trouble swallowing.   Eyes: Negative for eye problems and icterus.  Respiratory: Negative for dyspnea on exertion and chronic dry cough.  Negative for hemoptysis and wheezing.   Cardiovascular: Negative for chest pain and leg swelling.  Gastrointestinal: Positive for occasional nausea.  Negative for abdominal pain, constipation, diarrhea, and vomiting.  Genitourinary: Negative for bladder incontinence, difficulty urinating, dysuria, frequency and hematuria.   Musculoskeletal: Today for posterior right leg pain.  Negative for back pain, gait problem, neck pain and neck stiffness.  Skin: Negative for itching and rash.  Neurological: Positive for headaches.  Negative for dizziness, extremity weakness, gait problem, light-headedness and seizures.  Hematological: Negative for adenopathy. Does not bruise/bleed easily.   Psychiatric/Behavioral: Negative for confusion, depression and sleep disturbance. The patient is not nervous/anxious.     PHYSICAL EXAMINATION:  Blood pressure 135/73, pulse 65, temperature 97.6 F (36.4 C), temperature source Temporal, resp. rate 18, weight 249  lb 1.6 oz (113 kg), SpO2 94 %.  ECOG PERFORMANCE STATUS: 1  Physical Exam  Constitutional: Oriented to person, place, and time and well-developed, well-nourished, and in no distress.  HENT:  Head: Normocephalic and atraumatic.  Mouth/Throat: Oropharynx is clear and moist. No oropharyngeal exudate.  Eyes: Conjunctivae are normal. Right eye exhibits no discharge. Left eye exhibits no discharge. No scleral icterus.  Neck: Normal range of motion. Neck supple.  Cardiovascular: Normal rate, regular rhythm, normal heart sounds and intact distal pulses.   Pulmonary/Chest: Effort normal and breath sounds normal except for mild expiratory wheezing bilaterally.  No respiratory distress. No rales.  Abdominal: Soft. Bowel sounds are normal. Exhibits no distension and no mass. There is no tenderness.  Musculoskeletal: Normal range of motion. Exhibits no edema.  Lymphadenopathy:    No cervical adenopathy.  Neurological: Alert and oriented to person, place, and time. Exhibits normal muscle tone. Gait normal. Coordination normal.  Skin: Skin is warm and dry. No rash noted. Not diaphoretic. No erythema. No pallor.  Psychiatric: Mood, memory and judgment normal.  Vitals reviewed.  LABORATORY DATA: Lab Results  Component Value Date   WBC 7.9 08/19/2022   HGB 14.4 08/19/2022   HCT 43.6 08/19/2022   MCV 92.0 08/19/2022   PLT 142 (L) 08/19/2022      Chemistry      Component Value Date/Time   NA 139 07/30/2020 0853   NA 139 12/21/2019 0730   K 4.2 07/30/2020 0853   CL 104 07/30/2020 0853   CO2 25 07/30/2020 0853   BUN 13 07/30/2020 0853   BUN 18 12/21/2019 0730   CREATININE 1.20 07/29/2022 1509      Component Value Date/Time    CALCIUM 9.2 07/30/2020 0853   ALKPHOS 63 04/09/2020 0705   AST 24 04/09/2020 0705   ALT 30 04/09/2020 0705   BILITOT 0.7 04/09/2020 0705       RADIOGRAPHIC STUDIES: CT BONE TROCAR/NEEDLE BIOPSY DEEP  Result Date: 08/19/2022 INDICATION: Chest mass EXAM: CT-guided core needle biopsy of right posterior chest wall mass MEDICATIONS: None. ANESTHESIA/SEDATION: Moderate (conscious) sedation was employed during this procedure. A total of Versed 1 mg and Fentanyl 75 mcg was administered intravenously. Moderate Sedation Time: 17 minutes. The patient's level of consciousness and vital signs were monitored continuously by radiology nursing throughout the procedure under my direct supervision. FLUOROSCOPY TIME:  N/a COMPLICATIONS: None immediate. PROCEDURE: Informed written consent was obtained from the patient after a thorough discussion of the procedural risks, benefits and alternatives. All questions were addressed. Maximal Sterile Barrier Technique was utilized including caps, mask, sterile gowns, sterile gloves, sterile drape, hand hygiene and skin antiseptic. A timeout was performed prior to the initiation of the procedure. The patient was placed prone on the exam table. Limited CT of the chest was performed for planning purposes. This demonstrated right posterior chest wall mass abutting the pleura with rib involvement. Skin entry site was marked, and the overlying skin was prepped and draped in the standard sterile fashion. Local analgesia was obtained with 1% lidocaine. Using intermittent CT fluoroscopy, a 17 gauge introducer needle was advanced towards the identified lesion. Subsequently, core needle biopsy was performed using an 18 gauge core biopsy device x4 total passes. Specimens were submitted in formalin to pathology for further handling. Limited postprocedure imaging demonstrated no complicating feature. The patient tolerated the procedure well, and was transferred to recovery in stable condition.  IMPRESSION: Successful CT-guided core needle biopsy of right posterior chest wall mass with pleural and  rib involvement. Electronically Signed   By: Albin Felling M.D.   On: 08/19/2022 13:20   NM PET Image Restag (PS) Skull Base To Thigh  Result Date: 08/06/2022 CLINICAL DATA:  Subsequent treatment strategy for non-small cell lung cancer. EXAM: NUCLEAR MEDICINE PET SKULL BASE TO THIGH TECHNIQUE: 12.9 mCi F-18 FDG was injected intravenously. Full-ring PET imaging was performed from the skull base to thigh after the radiotracer. CT data was obtained and used for attenuation correction and anatomic localization. Fasting blood glucose: 102 mg/dl COMPARISON:  Chest CT 07/29/2022 and 01/26/2022.  PET-CT 08/07/2021. FINDINGS: Mediastinal blood pool activity: SUV max 1.8 NECK: No hypermetabolic cervical lymph nodes are identified.Fairly symmetric activity within the lymphoid tissue of Waldeyer's ring is within physiologic limits.No suspicious activity identified within the pharyngeal mucosal space. Incidental CT findings: Bilateral carotid atherosclerosis. CHEST: Recently demonstrated distal paraesophageal node or subpleural nodule medially in the right lower lobe measuring 2.0 x 2.1 cm on image 037/0 is hypermetabolic with an SUV max of 9.4. No other hypermetabolic mediastinal, hilar or axillary lymph nodes. The recently demonstrated right lower lobe pulmonary nodules are hypermetabolic. The more anterior nodule measures 1.5 cm on image 48/7 and has an SUV max of 7.2. The subpleural masses in the right lower lobe associated with partial destruction of the right 7th and 9th ribs are also hypermetabolic the more superior component measuring 3.3 x 2.0 cm on image 83/4 has an SUV max of 16.4. The more inferior component measuring 3.2 x 1.7 cm on image 96/4 has an SUV max of 13.3. There is also focal hypermetabolic activity within the left upper lobe (SUV max 5.9) without clear corresponding nodule, potentially  inflammatory. No suspicious hypermetabolic activity surrounding the fiducial markers and post radiation changes in the right upper lobe. Incidental CT findings: Atherosclerosis of the aorta, great vessels and coronary arteries. Calcifications of the aortic valve. Stable small hiatal hernia containing fluid. Stable right upper lobe post treatment changes, additional scattered scarring and emphysema. ABDOMEN/PELVIS: There is no hypermetabolic activity within the liver, adrenal glands, spleen or pancreas. No definitive hypermetabolic nodal activity within the abdomen or pelvis there is focal mildly hypermetabolic activity in the portacaval space (SUV max 5.1) without definite corresponding enlarged lymph node. Scattered bowel activity is within physiologic limits. Incidental CT findings: Aortic and branch vessel atherosclerosis. No ascites or peritoneal nodularity. The appendix appears normal. SKELETON: As above, the pleural base masses posteriorly in the right lower lobe are associated with partial destruction of the right 7th and 9th ribs posteriorly. These lesions are hypermetabolic. No other osseous metastases are identified. Incidental CT findings: none IMPRESSION: 1. Multifocal thoracic metastatic disease. There are right lung pulmonary and pleural based nodules with hypermetabolic activity as described. Pleural based right lower lobe lesions are associated with partial destruction of the right 7th and 9th ribs. 2. No definite distant metastases identified. There is focal hypermetabolic activity in the left upper lobe without clear corresponding nodule, potentially inflammatory. 3. Indeterminate focal hypermetabolic activity in the portacaval space without definite corresponding enlarged lymph node. Attention on follow-up recommended. 4.  Aortic Atherosclerosis (ICD10-I70.0). Electronically Signed   By: Richardean Sale M.D.   On: 08/06/2022 16:35   CT CHEST W CONTRAST  Result Date: 07/31/2022 CLINICAL DATA:   Non-small cell lung cancer status post SBRT; * Tracking Code: BO * EXAM: CT CHEST WITH CONTRAST TECHNIQUE: Multidetector CT imaging of the chest was performed during intravenous contrast administration. RADIATION DOSE REDUCTION: This exam was performed according to the departmental  dose-optimization program which includes automated exposure control, adjustment of the mA and/or kV according to patient size and/or use of iterative reconstruction technique. CONTRAST:  70m OMNIPAQUE IOHEXOL 300 MG/ML  SOLN COMPARISON:  Chest CT dated Jan 26, 2022 FINDINGS: Cardiovascular: Normal heart size. No pericardial effusion. Normal caliber thoracic aorta with mild atherosclerotic disease. Severe left main and three-vessel coronary artery calcifications. Mediastinum/Nodes: Normal esophagus. Hiatal hernia containing fat and fluid. Enlarged right periaortic/periesophageal lymph node measuring 2.0 cm in short axis on series 2, image 111, previously measured 0.9 cm in short axis. Lungs/Pleura: Stable right upper lobe postradiation change. Right lower lobe subpleural nodule measuring 1.7 x 1.4 cm on series 4, image 107, previously 0.6 cm. More anterior subpleural nodule measuring 2.0 x 0.5 cm on series 4, image 102, previously 0.9 x 0.6 cm. Additional previously seen pulmonary nodules are stable. Reference right middle lobe nodule measuring 7 mm on series 4, image 66. Mild paraseptal emphysema. Unchanged findings of fibrotic interstitial lung disease, including lower lung predominant peribronchovascular reticular opacities and traction bronchiectasis, unchanged when compared to prior exam. No pleural effusion. Upper Abdomen: Hepatic steatosis.  No acute abnormality. Musculoskeletal: Right lower lobe subpleural nodule measuring 3.4 x 1.5 cm on series 2, image 65 with associated osseous destruction of the adjacent 7th rib. Right lower lobe pleural nodule with associated osseous destruction of the posterior 9th rib. IMPRESSION: 1.  Previously described right lower lobe subpleural nodules are markedly increased in size. 2. New right lower lobe pleural nodules with associated osseous destruction of the adjacent 7th and 9th ribs, compatible with pleural metastatic disease. 3. Enlarged right paraesophageal lymph node, concerning for nodal metastatic disease. 4. Unchanged findings of fibrotic interstitial lung disease. 5. Severe left main and three-vessel coronary artery calcifications. 6. Aortic Atherosclerosis (ICD10-I70.0) and Emphysema (ICD10-J43.9). Electronically Signed   By: LYetta GlassmanM.D.   On: 07/31/2022 19:03    ASSESSMENT: This is a very pleasant Caucasian male with recurrent lung cancer (T2a, N2, M1a), initially diagnosed with putative stage Ia T1 a, N0, M0 non-small cell lung cancer of the right upper lobe was treated in December 2021.  He had some recurrence and underwent SBRT to the right lower lobe nodule in March 2023.  In November 2023 the patient had his recurrence with multifocal thoracic metastatic disease there is a right lung pulmonary and pleural-based nodule and pleural-based right lower lobe region with partial destruction of the right seventh and ninth rib. There is also an indeterminate focal hypermetabolic activity in the portacaval space without definite corresponding enlarged lymph node.  He previously completed upper lobe radiation under the care of Dr. KSondra Comecompleted on 09/18/2020 He then underwent SBRT to the right lower lobe lung nodule 12/02/21 to the care of Dr. KSondra Come The patient recently had a PET scan performed on 08/06/2022 which showed multifocal thoracic metastatic disease there are right lung pulmonary and pleural-based nodules and partial destruction of the right seventh and ninth rib.  Patient is expected to see radiation oncology on today. Dr. MLisbeth Renshawrecommends 3 weeks of radiation palliative   Will request PD-L1.  We will order a brain MRI to complete the staging workup.  Dr.  MJulien Nordmannhad a lengthly discussion with the patient today about his current condition and treatment options. The patient was given the option of a referral to hospice/palliative vs. treatment with systemic chemotherapy with carboplatin for an AUC of 5, Taxol 175 mg/m, and Libtayo IV every 3 weeks with neulasta support.  The patient  is interested in proceeding with systemic chemotherapy.  He is expected to start her first dose of this treatment on 09/02/22.  We discussed the adverse side effects of treatment including but not limited to alopecia, myelosuppression, nausea and vomiting, peripheral neuropathy, liver or renal dysfunction as well as immunotherapy mediated adverse effects. We also discussed with her the adverse effect of the immunotherapy including but not limited to immunotherapy mediated skin rash, diarrhea, inflammation of the lung, kidney, liver, thyroid or other endocrine dysfunction   I will arrange for the patient to have a chemoeducation class prior to receiving his first cycle of chemotherapy.    I have sent Compazine 10 mg every 6 hours as needed for nausea to the clinic.   The patient will follow-up in 2 weeks for a one-week follow-up visit after completing his first cycle of chemotherapy.  Will have a EGD and colonoscopy performed under the care of Dr. Randel Pigg for his positive Cologuard.  Instructed to take Claritin 4-7 days after the Neulasta injection.  The patient was advised to call immediately if she has any concerning symptoms in the interval. The patient voices understanding of current disease status and treatment options and is in agreement with the current care plan. All questions were answered. The patient knows to call the clinic with any problems, questions or concerns. We can certainly see the patient much sooner if necessary   The patient voices understanding of current disease status and treatment options and is in agreement with the current care plan.  All  questions were answered. The patient knows to call the clinic with any problems, questions or concerns. We can certainly see the patient much sooner if necessary.  Thank you so much for allowing me to participate in the care of Daniel Ball. I will continue to follow up the patient with you and assist in his care.   Disclaimer: This note was dictated with voice recognition software. Similar sounding words can inadvertently be transcribed and may not be corrected upon review.   Lael Pilch L Johngabriel Verde August 26, 2022, 8:57 AM  ADDENDUM: Hematology/Oncology Attending: I had a face-to-face encounter with the patient today.  I reviewed his record, lab, scan and recommended his care plan.  This is a very pleasant 66 years old white male with history of hypertension, coronary artery disease status post stent placement, diabetes mellitus, COPD, interstitial lung disease as well as sleep apnea and GERD.  The patient also has a history of a stage Ia (T1c, N0, M0) non-small cell lung cancer, squamous cell carcinoma of the right upper lobe in December 2021 status post SBRT.  He was followed by observation and in March 2023 he underwent SBRT again to right lower lobe lung nodule. Further evaluation and close monitoring with CT scan of the chest on July 29, 2022 showed 2 new subpleural nodules with a subpleural nodule measuring 2.0 cm in the anterior location and the second 1.7 cm in addition to 3.4 cm subpleural nodule causing osseous destruction of the adjacent seventh right rib and there was also enlarging right paraesophageal node measuring up to 2.0 cm.  The patient had a PET scan on August 06, 2022 that showed the previous findings were hypermetabolic with partial destruction of the right seventh and ninth ribs.  The patient underwent CT-guided core biopsy of the right posterior chest wall mass by interventional radiology and the final pathology [id number] ) showed infiltrativing moderate to  poorly differentiated squamous cell carcinoma. immunohistochemical stains show that the  cells are positive for p63 and p40 and negative for TTF-1, CK7 and CK20-supporting diagnosis of squamous cell carcinoma.  The patient was referred to me today for evaluation and recommendation regarding treatment of his condition.  He was seen by Dr. Lisbeth Renshaw and expected to have palliative radiotherapy to the destructive lesion in the right ribs. I had a lengthy discussion with the patient and his wife today about his current disease stage, prognosis and treatment options. Unfortunately the patient has a stage IV (T2 a, N2, M1b) non-small cell lung cancer, squamous cell carcinoma presented with right lower lobe lung mass in addition to mediastinal lymphadenopathy and bone metastasis to the right ribs diagnosed and November 2023. I recommended for the patient to complete the staging work-up by ordering MRI of the brain which is scheduled to be done on August 30, 2022.  I will also send his tissue block to be tested for PD-L1 expression. I discussed with the patient his treatment options and he was given the option of palliative care versus palliative systemic chemotherapy with carboplatin for AUC of 5, paclitaxel 175 Mg/M2 and Libtayo (Cempilimab) 350 Mg IV every 3 weeks which works better and patient with a squamous cell carcinoma. I discussed with the patient the adverse effect of this treatment including but not limited to alopecia, myelosuppression, nausea and vomiting, peripheral neuropathy, liver or renal dysfunction as well as immunotherapy adverse effects. The patient is expected to start the first cycle of this treatment next week. He will have a chemotherapy education class before the first dose of his treatment. He will come back for follow-up visit in 2 weeks for evaluation and management of any adverse effect of his treatment. The patient was advised to call immediately if he has any other concerning  symptoms in the interval.  The total time spent in the appointment was 90 minutes. Disclaimer: This note was dictated with voice recognition software. Similar sounding words can inadvertently be transcribed and may be missed upon review. Eilleen Kempf, MD

## 2022-08-26 ENCOUNTER — Telehealth: Payer: Self-pay

## 2022-08-26 ENCOUNTER — Ambulatory Visit
Admission: RE | Admit: 2022-08-26 | Discharge: 2022-08-26 | Disposition: A | Discharge status: Home / Self Care | Payer: Medicare Other | Source: Ambulatory Visit | Attending: Radiation Oncology | Admitting: Radiation Oncology

## 2022-08-26 ENCOUNTER — Inpatient Hospital Stay (HOSPITAL_BASED_OUTPATIENT_CLINIC_OR_DEPARTMENT_OTHER): Payer: Medicare Other | Admitting: Physician Assistant

## 2022-08-26 ENCOUNTER — Other Ambulatory Visit: Payer: Self-pay

## 2022-08-26 VITALS — BP 135/73 | HR 65 | Temp 97.6°F | Resp 18 | Wt 249.1 lb

## 2022-08-26 DIAGNOSIS — C782 Secondary malignant neoplasm of pleura: Secondary | ICD-10-CM | POA: Insufficient documentation

## 2022-08-26 DIAGNOSIS — C7951 Secondary malignant neoplasm of bone: Secondary | ICD-10-CM | POA: Insufficient documentation

## 2022-08-26 DIAGNOSIS — Z5112 Encounter for antineoplastic immunotherapy: Secondary | ICD-10-CM | POA: Insufficient documentation

## 2022-08-26 DIAGNOSIS — C3491 Malignant neoplasm of unspecified part of right bronchus or lung: Secondary | ICD-10-CM | POA: Diagnosis not present

## 2022-08-26 DIAGNOSIS — F17210 Nicotine dependence, cigarettes, uncomplicated: Secondary | ICD-10-CM | POA: Diagnosis not present

## 2022-08-26 DIAGNOSIS — C3490 Malignant neoplasm of unspecified part of unspecified bronchus or lung: Secondary | ICD-10-CM | POA: Diagnosis not present

## 2022-08-26 DIAGNOSIS — Z5189 Encounter for other specified aftercare: Secondary | ICD-10-CM | POA: Insufficient documentation

## 2022-08-26 DIAGNOSIS — Z7189 Other specified counseling: Secondary | ICD-10-CM

## 2022-08-26 DIAGNOSIS — C3411 Malignant neoplasm of upper lobe, right bronchus or lung: Secondary | ICD-10-CM | POA: Insufficient documentation

## 2022-08-26 DIAGNOSIS — C3431 Malignant neoplasm of lower lobe, right bronchus or lung: Secondary | ICD-10-CM | POA: Diagnosis not present

## 2022-08-26 DIAGNOSIS — Z510 Encounter for antineoplastic radiation therapy: Secondary | ICD-10-CM | POA: Insufficient documentation

## 2022-08-26 DIAGNOSIS — Z5111 Encounter for antineoplastic chemotherapy: Secondary | ICD-10-CM | POA: Insufficient documentation

## 2022-08-26 MED ORDER — PROCHLORPERAZINE MALEATE 10 MG PO TABS: 10.0000 mg | ORAL_TABLET | Freq: Four times a day (QID) | ORAL | 2 refills | Status: AC | PRN

## 2022-08-26 NOTE — Telephone Encounter (Signed)
This nurse reached out to pathology and request PDL1 on of the pleural fluid from the most recent Cytology specimen, fine needle aspirate.  This nurse was advised the order will be sent to [facility].  No further questions or concerns noted.

## 2022-08-26 NOTE — Patient Instructions (Signed)
-  There are two main categories of lung cancer, they are named based on the size of the cancer cell. One is called Non-Small cell lung cancer. The other type is Small Cell Lung Cancer -The sample (biopsy) that they took of your tumor was consistent with a subtype of Non-small cell lung cancer called Squamous Cell Carcinoma.  -We covered a lot of important information at your appointment today regarding what the treatment plan is moving forward. Here are the the main points that were discussed at your office visit with [place] today:  -The treatment that you will receive consists of two chemotherapy drugs, called Carboplatin and Paclitaxel (also referred to as Taxol) and one immunotherapy drug called Libtayo.  -We are planning on starting your treatment next week on 09/02/22 but before your start your treatment, I would like you to attend a Chemotherapy Education Class. This involves having you sit down with one of our nurse educators. She will discuss with you one-on-one more details about your treatment as well as general information about resources here at the cancer center.  -Your treatment will be given once every 3 weeks. We will check your labs once a week for the first ~5 treatments just to make sure that important components of your blood are in an acceptable range -You will need to return 2 days after your treatment to receive an injection. This injection is important because it boosts your infection fighting cells in your body and helps protect you from getting an infection.  -We will get a CT scan after 3 treatments to check on the progress of treatment  Medications:  -Compazine was sent to your pharmacy. This medication is for nausea. You may take this every 6 hours as needed if you feel nausous.  -Once you get the shot 2 days after chemo. Some people may have joint aches. Please take claritin and tylenol if needed. Recommend claritin for about 4-7 days after the shot. You will not always need this  shot. You will only need it for the first 4 rounds of treatment. After the first 4 rounds, you will be on immunotherapy alone once every 3 weeks which is up to 2 years as long as it is working and as long as you do not develop any unusual side effects.   Referrals or Imaging:  -I will arrange for a brain MRI just to make sure nothing has spread to this area  Follow up:  -We will see you back for a follow up visit in about  2 weeks to see how your first treatment went and to make sure you are not having any side effects from treatment.   If you need to contact our office, please do not hesitate, we are here to help. Our number is [phone]. When you call, ask to speak to Cassie's or Dr. Worthy Flank nurse.

## 2022-08-26 NOTE — Progress Notes (Signed)
START OFF PATHWAY REGIMEN - Non-Small Cell Lung   [id number]:Cemiplimab 350 mg IV D1 + Carboplatin AUC=6 IV D1 + Paclitaxel 200 mg/m2 IV D1 q21 Days x 4 Cycles:   A cycle is every 21 days:     Paclitaxel      Carboplatin      Cemiplimab-rwlc   **Always confirm dose/schedule in your pharmacy ordering system**  Patient Characteristics: Stage IV Metastatic, Squamous, Molecular Analysis Not Elected, PS = 0, 1, Initial Chemotherapy/Immunotherapy, Immunotherapy Candidate, PD-L1 Expression Positive 1-49% (TPS) / Negative / Not Tested / Awaiting Test Results Therapeutic Status: Stage IV Metastatic Histology: Squamous Cell ECOG Performance Status: 1 Chemotherapy/Immunotherapy Line of Therapy: Initial Chemotherapy/Immunotherapy Immunotherapy Candidate Status: Candidate for Immunotherapy PD-L1 Expression Status: Awaiting Test Results Intent of Therapy: Non-Curative / Palliative Intent, Discussed with Patient

## 2022-08-27 ENCOUNTER — Telehealth: Payer: Self-pay

## 2022-08-27 ENCOUNTER — Other Ambulatory Visit: Payer: Self-pay | Admitting: Physician Assistant

## 2022-08-27 DIAGNOSIS — F40240 Claustrophobia: Secondary | ICD-10-CM

## 2022-08-27 MED ORDER — LORAZEPAM 0.5 MG PO TABS: 0.5000 mg | ORAL_TABLET | Freq: Once | ORAL | 0 refills | Status: AC

## 2022-08-27 NOTE — Telephone Encounter (Signed)
This nurse reached out to patient related to phone message about needing something for claustrophobia when he goes for his MRI on Sunday 11/10.  This nurse made him aware that the provider has called in a prescription for Ativan to his pharmacy.  Patient acknowledged understanding.  No further questions or concerns noted at this time.

## 2022-08-28 ENCOUNTER — Other Ambulatory Visit: Payer: Self-pay

## 2022-08-30 ENCOUNTER — Ambulatory Visit (HOSPITAL_COMMUNITY)
Admission: RE | Admit: 2022-08-30 | Discharge: 2022-08-30 | Disposition: A | Discharge status: Home / Self Care | Payer: Medicare Other | Source: Ambulatory Visit | Attending: Physician Assistant | Admitting: Physician Assistant

## 2022-08-30 DIAGNOSIS — C3490 Malignant neoplasm of unspecified part of unspecified bronchus or lung: Secondary | ICD-10-CM | POA: Diagnosis not present

## 2022-08-30 MED ORDER — GADOBUTROL 1 MMOL/ML IV SOLN
10.0000 mL | Freq: Once | INTRAVENOUS | Status: AC | PRN
  Administered 2022-08-30: 10 mL via INTRAVENOUS

## 2022-08-31 ENCOUNTER — Telehealth: Payer: Self-pay | Admitting: Internal Medicine

## 2022-08-31 NOTE — Telephone Encounter (Signed)
Scheduled per 12/06 los, patient has been called and notified.

## 2022-09-01 ENCOUNTER — Inpatient Hospital Stay: Payer: Medicare Other

## 2022-09-01 ENCOUNTER — Other Ambulatory Visit: Payer: Self-pay

## 2022-09-01 ENCOUNTER — Telehealth: Payer: Self-pay

## 2022-09-01 DIAGNOSIS — C3431 Malignant neoplasm of lower lobe, right bronchus or lung: Secondary | ICD-10-CM | POA: Diagnosis not present

## 2022-09-01 DIAGNOSIS — Z510 Encounter for antineoplastic radiation therapy: Secondary | ICD-10-CM | POA: Diagnosis not present

## 2022-09-01 DIAGNOSIS — C3491 Malignant neoplasm of unspecified part of right bronchus or lung: Secondary | ICD-10-CM

## 2022-09-01 DIAGNOSIS — F17210 Nicotine dependence, cigarettes, uncomplicated: Secondary | ICD-10-CM | POA: Diagnosis not present

## 2022-09-01 DIAGNOSIS — C782 Secondary malignant neoplasm of pleura: Secondary | ICD-10-CM | POA: Diagnosis not present

## 2022-09-01 DIAGNOSIS — Z5112 Encounter for antineoplastic immunotherapy: Secondary | ICD-10-CM | POA: Diagnosis not present

## 2022-09-01 DIAGNOSIS — C3411 Malignant neoplasm of upper lobe, right bronchus or lung: Secondary | ICD-10-CM | POA: Diagnosis not present

## 2022-09-01 DIAGNOSIS — C7951 Secondary malignant neoplasm of bone: Secondary | ICD-10-CM | POA: Diagnosis not present

## 2022-09-01 LAB — CBC WITH DIFFERENTIAL (CANCER CENTER ONLY)
Abs Immature Granulocytes: 0.03 10*3/uL (ref 0.00–0.07)
Basophils Absolute: 0 10*3/uL (ref 0.0–0.1)
Basophils Relative: 1 %
Eosinophils Absolute: 0.2 10*3/uL (ref 0.0–0.5)
Eosinophils Relative: 2 %
HCT: 41.4 % (ref 39.0–52.0)
Hemoglobin: 14.1 g/dL (ref 13.0–17.0)
Immature Granulocytes: 0 %
Lymphocytes Relative: 18 %
Lymphs Abs: 1.5 10*3/uL (ref 0.7–4.0)
MCH: 31.1 pg (ref 26.0–34.0)
MCHC: 34.1 g/dL (ref 30.0–36.0)
MCV: 91.2 fL (ref 80.0–100.0)
Monocytes Absolute: 0.7 10*3/uL (ref 0.1–1.0)
Monocytes Relative: 9 %
Neutro Abs: 5.7 10*3/uL (ref 1.7–7.7)
Neutrophils Relative %: 70 %
Platelet Count: 163 10*3/uL (ref 150–400)
RBC: 4.54 MIL/uL (ref 4.22–5.81)
RDW: 13.7 % (ref 11.5–15.5)
WBC Count: 8.2 10*3/uL (ref 4.0–10.5)
nRBC: 0 % (ref 0.0–0.2)

## 2022-09-01 LAB — CMP (CANCER CENTER ONLY)
ALT: 19 U/L (ref 0–44)
AST: 19 U/L (ref 15–41)
Albumin: 4.3 g/dL (ref 3.5–5.0)
Alkaline Phosphatase: 87 U/L (ref 38–126)
Anion gap: 8 (ref 5–15)
BUN: 15 mg/dL (ref 8–23)
CO2: 27 mmol/L (ref 22–32)
Calcium: 10 mg/dL (ref 8.9–10.3)
Chloride: 103 mmol/L (ref 98–111)
Creatinine: 0.99 mg/dL (ref 0.61–1.24)
GFR, Estimated: >60 mL/min (ref >60)
Glucose, Bld: 88 mg/dL (ref 70–99)
Potassium: 4.3 mmol/L (ref 3.5–5.1)
Sodium: 138 mmol/L (ref 135–145)
Total Bilirubin: 0.4 mg/dL (ref 0.3–1.2)
Total Protein: 7.2 g/dL (ref 6.5–8.1)

## 2022-09-01 NOTE — Telephone Encounter (Signed)
No additional comment needed

## 2022-09-01 NOTE — Progress Notes (Signed)
Disregard

## 2022-09-02 ENCOUNTER — Other Ambulatory Visit: Payer: Self-pay

## 2022-09-02 ENCOUNTER — Ambulatory Visit
Admission: RE | Admit: 2022-09-02 | Discharge: 2022-09-02 | Disposition: A | Discharge status: Home / Self Care | Payer: Medicare Other | Source: Ambulatory Visit | Attending: Radiation Oncology | Admitting: Radiation Oncology

## 2022-09-02 ENCOUNTER — Inpatient Hospital Stay: Payer: Medicare Other

## 2022-09-02 VITALS — BP 129/74 | HR 82 | Temp 97.9°F | Resp 17 | Ht 64.0 in | Wt 249.5 lb

## 2022-09-02 DIAGNOSIS — Z5112 Encounter for antineoplastic immunotherapy: Secondary | ICD-10-CM | POA: Diagnosis not present

## 2022-09-02 DIAGNOSIS — Z510 Encounter for antineoplastic radiation therapy: Secondary | ICD-10-CM | POA: Diagnosis not present

## 2022-09-02 DIAGNOSIS — F17210 Nicotine dependence, cigarettes, uncomplicated: Secondary | ICD-10-CM | POA: Diagnosis not present

## 2022-09-02 DIAGNOSIS — C3411 Malignant neoplasm of upper lobe, right bronchus or lung: Secondary | ICD-10-CM | POA: Diagnosis not present

## 2022-09-02 DIAGNOSIS — C3491 Malignant neoplasm of unspecified part of right bronchus or lung: Secondary | ICD-10-CM

## 2022-09-02 DIAGNOSIS — C3431 Malignant neoplasm of lower lobe, right bronchus or lung: Secondary | ICD-10-CM | POA: Diagnosis not present

## 2022-09-02 DIAGNOSIS — C7951 Secondary malignant neoplasm of bone: Secondary | ICD-10-CM | POA: Diagnosis not present

## 2022-09-02 DIAGNOSIS — C782 Secondary malignant neoplasm of pleura: Secondary | ICD-10-CM | POA: Diagnosis not present

## 2022-09-02 LAB — RAD ONC ARIA SESSION SUMMARY
Course Elapsed Days: 0
Plan Fractions Treated to Date: 1
Plan Prescribed Dose Per Fraction: 2.5 Gy
Plan Total Fractions Prescribed: 15
Plan Total Prescribed Dose: 37.5 Gy
Reference Point Dosage Given to Date: 2.5 Gy
Reference Point Session Dosage Given: 2.5 Gy
Session Number: 1

## 2022-09-02 MED ORDER — SODIUM CHLORIDE 0.9 % IV SOLN
175.0000 mg/m2 | Freq: Once | INTRAVENOUS | Status: AC
  Administered 2022-09-02: 396 mg via INTRAVENOUS
  Filled 2022-09-02: qty 66

## 2022-09-02 MED ORDER — SODIUM CHLORIDE 0.9 % IV SOLN
150.0000 mg | Freq: Once | INTRAVENOUS | Status: AC
  Administered 2022-09-02: 150 mg via INTRAVENOUS
  Filled 2022-09-02: qty 150

## 2022-09-02 MED ORDER — DIPHENHYDRAMINE HCL 50 MG/ML IJ SOLN
50.0000 mg | Freq: Once | INTRAMUSCULAR | Status: AC
  Administered 2022-09-02: 50 mg via INTRAVENOUS
  Filled 2022-09-02: qty 1

## 2022-09-02 MED ORDER — SODIUM CHLORIDE 0.9 % IV SOLN
10.0000 mg | Freq: Once | INTRAVENOUS | Status: AC
  Administered 2022-09-02: 10 mg via INTRAVENOUS
  Filled 2022-09-02: qty 10

## 2022-09-02 MED ORDER — SODIUM CHLORIDE 0.9 % IV SOLN
350.0000 mg | Freq: Once | INTRAVENOUS | Status: AC
  Administered 2022-09-02: 350 mg via INTRAVENOUS
  Filled 2022-09-02: qty 7

## 2022-09-02 MED ORDER — FAMOTIDINE IN NACL 20-0.9 MG/50ML-% IV SOLN
20.0000 mg | Freq: Once | INTRAVENOUS | Status: AC
  Administered 2022-09-02: 20 mg via INTRAVENOUS
  Filled 2022-09-02: qty 50

## 2022-09-02 MED ORDER — SODIUM CHLORIDE 0.9 % IV SOLN
690.0000 mg | Freq: Once | INTRAVENOUS | Status: AC
  Administered 2022-09-02: 690 mg via INTRAVENOUS
  Filled 2022-09-02: qty 69

## 2022-09-02 MED ORDER — SODIUM CHLORIDE 0.9 % IV SOLN: Freq: Once | INTRAVENOUS | Status: AC

## 2022-09-02 MED ORDER — PALONOSETRON HCL INJECTION 0.25 MG/5ML
0.2500 mg | Freq: Once | INTRAVENOUS | Status: AC
  Administered 2022-09-02: 0.25 mg via INTRAVENOUS
  Filled 2022-09-02: qty 5

## 2022-09-02 NOTE — Patient Instructions (Signed)
[facility]  Discharge Instructions: Thank you for choosing [facility] to provide your oncology and hematology care.   If you have a lab appointment with the [facility], please go directly to the [facility] and check in at the registration area.   Wear comfortable clothing and clothing appropriate for easy access to any Portacath or PICC line.   We strive to give you quality time with your provider. You may need to reschedule your appointment if you arrive late (15 or more minutes).  Arriving late affects you and other patients whose appointments are after yours.  Also, if you miss three or more appointments without notifying the office, you may be dismissed from the clinic at the provider's discretion.      For prescription refill requests, have your pharmacy contact our office and allow 72 hours for refills to be completed.    Today you received the following chemotherapy and/or immunotherapy agents : Libtayo, paclitaxel, Carboplatin      To help prevent nausea and vomiting after your treatment, we encourage you to take your nausea medication as directed.  BELOW ARE SYMPTOMS THAT SHOULD BE REPORTED IMMEDIATELY: *FEVER GREATER THAN 100.4 F (38 C) OR HIGHER *CHILLS OR SWEATING *NAUSEA AND VOMITING THAT IS NOT CONTROLLED WITH YOUR NAUSEA MEDICATION *UNUSUAL SHORTNESS OF BREATH *UNUSUAL BRUISING OR BLEEDING *URINARY PROBLEMS (pain or burning when urinating, or frequent urination) *BOWEL PROBLEMS (unusual diarrhea, constipation, pain near the anus) TENDERNESS IN MOUTH AND THROAT WITH OR WITHOUT PRESENCE OF ULCERS (sore throat, sores in mouth, or a toothache) UNUSUAL RASH, SWELLING OR PAIN  UNUSUAL VAGINAL DISCHARGE OR ITCHING   Items with * indicate a potential emergency and should be followed up as soon as possible or go to the Emergency Department if any problems should occur.  Please show the CHEMOTHERAPY ALERT CARD or IMMUNOTHERAPY  ALERT CARD at check-in to the Emergency Department and triage nurse.  Should you have questions after your visit or need to cancel or reschedule your appointment, please contact [facility]  Dept: [phone]  and follow the prompts.  Office hours are 8:00 a.m. to 4:30 p.m. Monday - Friday. Please note that voicemails left after 4:00 p.m. may not be returned until the following business day.  We are closed weekends and major holidays. You have access to a nurse at all times for urgent questions. Please call the main number to the clinic Dept: [phone] and follow the prompts.   For any non-urgent questions, you may also contact your provider using MyChart. We now offer e-Visits for anyone 48 and older to request care online for non-urgent symptoms. For details visit [email or web address].   Also download the MyChart app! Go to the app store, search "MyChart", open the app, select [facility], and log in with your MyChart username and password.  Masks are optional in the cancer centers. If you would like for your care team to wear a mask while they are taking care of you, please let them know. You may have one support person who is at least 68 years old accompany you for your appointments. Cemiplimab Injection What is this medication? CEMIPLIMAB (se MIP li mab) treats skin cancer and lung cancer. It works by helping your immune system slow or stop the spread of cancer cells. It is a monoclonal antibody. This medicine may be used for other purposes; ask your health care provider or pharmacist if you have questions. COMMON BRAND NAME(S):  LIBTAYO What should I tell my care team before I take this medication? They need to know if you have any of these conditions: Allogeneic stem cell transplant (uses someone else's stem cells) Autoimmune diseases, such as Crohn's disease, ulcerative colitis, or lupus Diabetes Nervous system problems, such as myasthenia gravis or  Guillain-Barre syndrome Organ transplant Recent or ongoing radiation Thyroid disease An unusual or allergic reaction to cemiplimab, other medications, foods, dyes, or preservatives Pregnant or trying to get pregnant Breast-feeding How should I use this medication? This medication is infused into a vein. It is given by your care team in a hospital or clinic setting. A special MedGuide will be given to you before each treatment. Be sure to read this information carefully each time. Talk to your care team about the use of this medication in children. Special care may be needed. Overdosage: If you think you have taken too much of this medicine contact a poison control center or emergency room at once. NOTE: This medicine is only for you. Do not share this medicine with others. What if I miss a dose? Keep appointments for follow-up doses. It is important not to miss your dose. Call your care team if you are unable to keep an appointment. What may interact with this medication? Interactions have not been studied. This list may not describe all possible interactions. Give your health care provider a list of all the medicines, herbs, non-prescription drugs, or dietary supplements you use. Also tell them if you smoke, drink alcohol, or use illegal drugs. Some items may interact with your medicine. What should I watch for while using this medication? This medication may make you feel generally unwell. This is not uncommon as chemotherapy can affect healthy cells as well as cancer cells. Report any side effects. Continue your course of treatment even though you feel ill until your care team tells you to stop. You may need blood work done while you are taking this medication. This medication may cause serious skin reactions. They can happen in the weeks to months after starting the medication. Contact your care team right away if you notice fevers or flu-like symptoms with a rash. The rash may be red or  purple and then turn into blisters or peeling of the skin. You may also notice a red rash with swelling of the face, lips, or lymph nodes in your neck or under your arms. Tell your care team right away if you have any changes in your vision. This medication may increase blood sugar. The risk may be higher in patients who already have diabetes. Ask your care team what you can do to lower your risk of diabetes while taking this medication. Talk to your care team if you wish to become pregnant or think you might be pregnant. This medication can cause serious birth defects if taken during pregnancy. A negative pregnancy test is required before starting this medication. A reliable form of contraception is recommended while taking this medication and for at least 4 months after stopping it. Do not breast-feed while taking this medication and for at least 4 months after stopping it. What side effects may I notice from receiving this medication? Side effects that you should report to your care team as soon as possible: Allergic reactions--skin rash, itching, hives, swelling of the face, lips, tongue, or throat Dry cough, shortness of breath or trouble breathing Eye pain, redness, irritation, or discharge with blurry or decreased vision Heart muscle inflammation--unusual weakness or fatigue, shortness of  breath, chest pain, fast or irregular heartbeat, dizziness, swelling of the ankles, feet, or hands Hormone gland problems--headache, sensitivity to light, unusual weakness or fatigue, dizziness, fast or irregular heartbeat, increased sensitivity to cold or heat, excessive sweating, constipation, hair loss, increased thirst or amount of urine, tremors or shaking, irritability Infusion reactions--chest pain, shortness of breath or trouble breathing, feeling faint or lightheaded Kidney injury (glomerulonephritis)--decrease in the amount of urine, red or dark brown urine, foamy or bubbly urine, swelling of the  ankles, hands, or feet Liver injury--right upper belly pain, loss of appetite, nausea, light-colored stool, dark yellow or brown urine, yellowing skin or eyes, unusual weakness or fatigue Pain, tingling, or numbness in the hands or feet, muscle weakness, change in vision, confusion or trouble speaking, loss of balance or coordination, trouble walking, seizures Rash, fever, and swollen lymph nodes Redness, blistering, peeling, or loosening of the skin, including inside the mouth Sudden or severe stomach pain, bloody diarrhea, fever, nausea, vomiting Side effects that usually do not require medical attention (report these to your care team if they continue or are bothersome): Bone, joint, or muscle pain Diarrhea Fatigue Loss of appetite Nausea Skin rash This list may not describe all possible side effects. Call your doctor for medical advice about side effects. You may report side effects to FDA at 1-800-FDA-1088. Where should I keep my medication? This medication is given in a hospital or clinic and will not be stored at home. NOTE: This sheet is a summary. It may not cover all possible information. If you have questions about this medicine, talk to your doctor, pharmacist, or health care provider.  2023 Elsevier/Gold Standard (2021-08-13 00:00:00) Paclitaxel Injection What is this medication? PACLITAXEL (PAK li TAX el) treats some types of cancer. It works by slowing down the growth of cancer cells. This medicine may be used for other purposes; ask your health care provider or pharmacist if you have questions. COMMON BRAND NAME(S): Onxol, Taxol What should I tell my care team before I take this medication? They need to know if you have any of these conditions: Heart disease Liver disease Low white blood cell levels An unusual or allergic reaction to paclitaxel, other medications, foods, dyes, or preservatives If you or your partner are pregnant or trying to get  pregnant Breast-feeding How should I use this medication? This medication is injected into a vein. It is given by your care team in a hospital or clinic setting. Talk to your care team about the use of this medication in children. While it may be given to children for selected conditions, precautions do apply. Overdosage: If you think you have taken too much of this medicine contact a poison control center or emergency room at once. NOTE: This medicine is only for you. Do not share this medicine with others. What if I miss a dose? Keep appointments for follow-up doses. It is important not to miss your dose. Call your care team if you are unable to keep an appointment. What may interact with this medication? Do not take this medication with any of the following: Live virus vaccines Other medications may affect the way this medication works. Talk with your care team about all of the medications you take. They may suggest changes to your treatment plan to lower the risk of side effects and to make sure your medications work as intended. This list may not describe all possible interactions. Give your health care provider a list of all the medicines, herbs, non-prescription drugs, or  dietary supplements you use. Also tell them if you smoke, drink alcohol, or use illegal drugs. Some items may interact with your medicine. What should I watch for while using this medication? Your condition will be monitored carefully while you are receiving this medication. You may need blood work while taking this medication. This medication may make you feel generally unwell. This is not uncommon as chemotherapy can affect healthy cells as well as cancer cells. Report any side effects. Continue your course of treatment even though you feel ill unless your care team tells you to stop. This medication can cause serious allergic reactions. To reduce the risk, your care team may give you other medications to take before  receiving this one. Be sure to follow the directions from your care team. This medication may increase your risk of getting an infection. Call your care team for advice if you get a fever, chills, sore throat, or other symptoms of a cold or flu. Do not treat yourself. Try to avoid being around people who are sick. This medication may increase your risk to bruise or bleed. Call your care team if you notice any unusual bleeding. Be careful brushing or flossing your teeth or using a toothpick because you may get an infection or bleed more easily. If you have any dental work done, tell your dentist you are receiving this medication. Talk to your care team if you may be pregnant. Serious birth defects can occur if you take this medication during pregnancy. Talk to your care team before breastfeeding. Changes to your treatment plan may be needed. What side effects may I notice from receiving this medication? Side effects that you should report to your care team as soon as possible: Allergic reactions--skin rash, itching, hives, swelling of the face, lips, tongue, or throat Heart rhythm changes--fast or irregular heartbeat, dizziness, feeling faint or lightheaded, chest pain, trouble breathing Increase in blood pressure Infection--fever, chills, cough, sore throat, wounds that don't heal, pain or trouble when passing urine, general feeling of discomfort or being unwell Low blood pressure--dizziness, feeling faint or lightheaded, blurry vision Low red blood cell level--unusual weakness or fatigue, dizziness, headache, trouble breathing Painful swelling, warmth, or redness of the skin, blisters or sores at the infusion site Pain, tingling, or numbness in the hands or feet Slow heartbeat--dizziness, feeling faint or lightheaded, confusion, trouble breathing, unusual weakness or fatigue Unusual bruising or bleeding Side effects that usually do not require medical attention (report to your care team if they  continue or are bothersome): Diarrhea Hair loss Joint pain Loss of appetite Muscle pain Nausea Vomiting This list may not describe all possible side effects. Call your doctor for medical advice about side effects. You may report side effects to FDA at 1-800-FDA-1088. Where should I keep my medication? This medication is given in a hospital or clinic. It will not be stored at home. NOTE: This sheet is a summary. It may not cover all possible information. If you have questions about this medicine, talk to your doctor, pharmacist, or health care provider.  2023 Elsevier/Gold Standard (2022-01-07 00:00:00) Carboplatin Injection What is this medication? CARBOPLATIN (KAR boe pla tin) treats some types of cancer. It works by slowing down the growth of cancer cells. This medicine may be used for other purposes; ask your health care provider or pharmacist if you have questions. COMMON BRAND NAME(S): Paraplatin What should I tell my care team before I take this medication? They need to know if you have any of these  conditions: Blood disorders Hearing problems Kidney disease Recent or ongoing radiation therapy An unusual or allergic reaction to carboplatin, cisplatin, other medications, foods, dyes, or preservatives Pregnant or trying to get pregnant Breast-feeding How should I use this medication? This medication is injected into a vein. It is given by your care team in a hospital or clinic setting. Talk to your care team about the use of this medication in children. Special care may be needed. Overdosage: If you think you have taken too much of this medicine contact a poison control center or emergency room at once. NOTE: This medicine is only for you. Do not share this medicine with others. What if I miss a dose? Keep appointments for follow-up doses. It is important not to miss your dose. Call your care team if you are unable to keep an appointment. What may interact with this  medication? Medications for seizures Some antibiotics, such as amikacin, gentamicin, neomycin, streptomycin, tobramycin Vaccines This list may not describe all possible interactions. Give your health care provider a list of all the medicines, herbs, non-prescription drugs, or dietary supplements you use. Also tell them if you smoke, drink alcohol, or use illegal drugs. Some items may interact with your medicine. What should I watch for while using this medication? Your condition will be monitored carefully while you are receiving this medication. You may need blood work while taking this medication. This medication may make you feel generally unwell. This is not uncommon, as chemotherapy can affect healthy cells as well as cancer cells. Report any side effects. Continue your course of treatment even though you feel ill unless your care team tells you to stop. In some cases, you may be given additional medications to help with side effects. Follow all directions for their use. This medication may increase your risk of getting an infection. Call your care team for advice if you get a fever, chills, sore throat, or other symptoms of a cold or flu. Do not treat yourself. Try to avoid being around people who are sick. Avoid taking medications that contain aspirin, acetaminophen, ibuprofen, naproxen, or ketoprofen unless instructed by your care team. These medications may hide a fever. Be careful brushing or flossing your teeth or using a toothpick because you may get an infection or bleed more easily. If you have any dental work done, tell your dentist you are receiving this medication. Talk to your care team if you wish to become pregnant or think you might be pregnant. This medication can cause serious birth defects. Talk to your care team about effective forms of contraception. Do not breast-feed while taking this medication. What side effects may I notice from receiving this medication? Side effects  that you should report to your care team as soon as possible: Allergic reactions--skin rash, itching, hives, swelling of the face, lips, tongue, or throat Infection--fever, chills, cough, sore throat, wounds that don't heal, pain or trouble when passing urine, general feeling of discomfort or being unwell Low red blood cell level--unusual weakness or fatigue, dizziness, headache, trouble breathing Pain, tingling, or numbness in the hands or feet, muscle weakness, change in vision, confusion or trouble speaking, loss of balance or coordination, trouble walking, seizures Unusual bruising or bleeding Side effects that usually do not require medical attention (report to your care team if they continue or are bothersome): Hair loss Nausea Unusual weakness or fatigue Vomiting This list may not describe all possible side effects. Call your doctor for medical advice about side effects. You may  report side effects to FDA at 1-800-FDA-1088. Where should I keep my medication? This medication is given in a hospital or clinic. It will not be stored at home. NOTE: This sheet is a summary. It may not cover all possible information. If you have questions about this medicine, talk to your doctor, pharmacist, or health care provider.  2023 Elsevier/Gold Standard (2021-12-22 00:00:00)

## 2022-09-03 ENCOUNTER — Telehealth: Payer: Self-pay

## 2022-09-03 ENCOUNTER — Other Ambulatory Visit: Payer: Self-pay

## 2022-09-03 ENCOUNTER — Ambulatory Visit
Admission: RE | Admit: 2022-09-03 | Discharge: 2022-09-03 | Disposition: A | Discharge status: Home / Self Care | Payer: Medicare Other | Source: Ambulatory Visit | Attending: Radiation Oncology | Admitting: Radiation Oncology

## 2022-09-03 DIAGNOSIS — C3431 Malignant neoplasm of lower lobe, right bronchus or lung: Secondary | ICD-10-CM | POA: Diagnosis not present

## 2022-09-03 DIAGNOSIS — C782 Secondary malignant neoplasm of pleura: Secondary | ICD-10-CM | POA: Diagnosis not present

## 2022-09-03 DIAGNOSIS — Z5112 Encounter for antineoplastic immunotherapy: Secondary | ICD-10-CM | POA: Diagnosis not present

## 2022-09-03 DIAGNOSIS — C7951 Secondary malignant neoplasm of bone: Secondary | ICD-10-CM | POA: Diagnosis not present

## 2022-09-03 DIAGNOSIS — C3411 Malignant neoplasm of upper lobe, right bronchus or lung: Secondary | ICD-10-CM | POA: Diagnosis not present

## 2022-09-03 DIAGNOSIS — Z510 Encounter for antineoplastic radiation therapy: Secondary | ICD-10-CM | POA: Diagnosis not present

## 2022-09-03 DIAGNOSIS — F17210 Nicotine dependence, cigarettes, uncomplicated: Secondary | ICD-10-CM | POA: Diagnosis not present

## 2022-09-03 LAB — RAD ONC ARIA SESSION SUMMARY
Course Elapsed Days: 1
Plan Fractions Treated to Date: 2
Plan Prescribed Dose Per Fraction: 2.5 Gy
Plan Total Fractions Prescribed: 15
Plan Total Prescribed Dose: 37.5 Gy
Reference Point Dosage Given to Date: 5 Gy
Reference Point Session Dosage Given: 2.5 Gy
Session Number: 2

## 2022-09-03 NOTE — Telephone Encounter (Signed)
Mr. Daniel Ball states that he is a little fatigued today other wise doing fine.  He is eating, drinking, and urinating well.  He knows to call the office at [phone] if he has any questions or concerns.

## 2022-09-03 NOTE — Telephone Encounter (Signed)
-----   Message from Ngumabih Ngu, RN sent at 09/02/2022  3:31 PM EST ----- Regarding: Dr. Julien Nordmann First Time Chemo Follow up/ [facility]

## 2022-09-04 ENCOUNTER — Encounter: Payer: Self-pay | Admitting: Internal Medicine

## 2022-09-04 ENCOUNTER — Ambulatory Visit
Admission: RE | Admit: 2022-09-04 | Discharge: 2022-09-04 | Disposition: A | Discharge status: Home / Self Care | Payer: Medicare Other | Source: Ambulatory Visit | Attending: Radiation Oncology | Admitting: Radiation Oncology

## 2022-09-04 ENCOUNTER — Inpatient Hospital Stay: Payer: Medicare Other

## 2022-09-04 ENCOUNTER — Other Ambulatory Visit: Payer: Self-pay

## 2022-09-04 VITALS — BP 123/70 | HR 75 | Temp 98.0°F | Resp 20

## 2022-09-04 DIAGNOSIS — C3411 Malignant neoplasm of upper lobe, right bronchus or lung: Secondary | ICD-10-CM | POA: Diagnosis not present

## 2022-09-04 DIAGNOSIS — Z510 Encounter for antineoplastic radiation therapy: Secondary | ICD-10-CM | POA: Diagnosis not present

## 2022-09-04 DIAGNOSIS — C7951 Secondary malignant neoplasm of bone: Secondary | ICD-10-CM | POA: Diagnosis not present

## 2022-09-04 DIAGNOSIS — Z5112 Encounter for antineoplastic immunotherapy: Secondary | ICD-10-CM | POA: Diagnosis not present

## 2022-09-04 DIAGNOSIS — F17210 Nicotine dependence, cigarettes, uncomplicated: Secondary | ICD-10-CM | POA: Diagnosis not present

## 2022-09-04 DIAGNOSIS — C782 Secondary malignant neoplasm of pleura: Secondary | ICD-10-CM | POA: Diagnosis not present

## 2022-09-04 DIAGNOSIS — C3431 Malignant neoplasm of lower lobe, right bronchus or lung: Secondary | ICD-10-CM | POA: Diagnosis not present

## 2022-09-04 DIAGNOSIS — C3491 Malignant neoplasm of unspecified part of right bronchus or lung: Secondary | ICD-10-CM

## 2022-09-04 LAB — RAD ONC ARIA SESSION SUMMARY
Course Elapsed Days: 2
Plan Fractions Treated to Date: 3
Plan Prescribed Dose Per Fraction: 2.5 Gy
Plan Total Fractions Prescribed: 15
Plan Total Prescribed Dose: 37.5 Gy
Reference Point Dosage Given to Date: 7.5 Gy
Reference Point Session Dosage Given: 2.5 Gy
Session Number: 3

## 2022-09-04 MED ORDER — PEGFILGRASTIM-PBBK 6 MG/0.6ML ~~LOC~~ SOSY
6.0000 mg | PREFILLED_SYRINGE | Freq: Once | SUBCUTANEOUS | Status: AC
  Administered 2022-09-04: 6 mg via SUBCUTANEOUS
  Filled 2022-09-04: qty 0.6

## 2022-09-07 ENCOUNTER — Other Ambulatory Visit: Payer: Self-pay | Admitting: Medical Oncology

## 2022-09-07 ENCOUNTER — Other Ambulatory Visit: Payer: Self-pay

## 2022-09-07 ENCOUNTER — Ambulatory Visit
Admission: RE | Admit: 2022-09-07 | Discharge: 2022-09-07 | Disposition: A | Discharge status: Home / Self Care | Payer: Medicare Other | Source: Ambulatory Visit | Attending: Radiation Oncology | Admitting: Radiation Oncology

## 2022-09-07 DIAGNOSIS — C782 Secondary malignant neoplasm of pleura: Secondary | ICD-10-CM | POA: Diagnosis not present

## 2022-09-07 DIAGNOSIS — C3491 Malignant neoplasm of unspecified part of right bronchus or lung: Secondary | ICD-10-CM

## 2022-09-07 DIAGNOSIS — F17210 Nicotine dependence, cigarettes, uncomplicated: Secondary | ICD-10-CM | POA: Diagnosis not present

## 2022-09-07 DIAGNOSIS — Z5112 Encounter for antineoplastic immunotherapy: Secondary | ICD-10-CM | POA: Diagnosis not present

## 2022-09-07 DIAGNOSIS — Z510 Encounter for antineoplastic radiation therapy: Secondary | ICD-10-CM | POA: Diagnosis not present

## 2022-09-07 DIAGNOSIS — C3431 Malignant neoplasm of lower lobe, right bronchus or lung: Secondary | ICD-10-CM | POA: Diagnosis not present

## 2022-09-07 DIAGNOSIS — C7951 Secondary malignant neoplasm of bone: Secondary | ICD-10-CM | POA: Diagnosis not present

## 2022-09-07 DIAGNOSIS — C3411 Malignant neoplasm of upper lobe, right bronchus or lung: Secondary | ICD-10-CM | POA: Diagnosis not present

## 2022-09-07 LAB — RAD ONC ARIA SESSION SUMMARY
Course Elapsed Days: 5
Plan Fractions Treated to Date: 4
Plan Prescribed Dose Per Fraction: 2.5 Gy
Plan Total Fractions Prescribed: 15
Plan Total Prescribed Dose: 37.5 Gy
Reference Point Dosage Given to Date: 10 Gy
Reference Point Session Dosage Given: 2.5 Gy
Session Number: 4

## 2022-09-08 ENCOUNTER — Other Ambulatory Visit: Payer: Self-pay

## 2022-09-08 ENCOUNTER — Inpatient Hospital Stay (HOSPITAL_BASED_OUTPATIENT_CLINIC_OR_DEPARTMENT_OTHER): Payer: Medicare Other | Admitting: Internal Medicine

## 2022-09-08 ENCOUNTER — Ambulatory Visit
Admission: RE | Admit: 2022-09-08 | Discharge: 2022-09-08 | Disposition: A | Discharge status: Home / Self Care | Payer: Medicare Other | Source: Ambulatory Visit | Attending: Radiation Oncology | Admitting: Radiation Oncology

## 2022-09-08 ENCOUNTER — Inpatient Hospital Stay: Payer: Medicare Other

## 2022-09-08 VITALS — BP 117/76 | HR 72 | Temp 98.0°F | Resp 18 | Wt 244.4 lb

## 2022-09-08 DIAGNOSIS — Z5111 Encounter for antineoplastic chemotherapy: Secondary | ICD-10-CM | POA: Diagnosis not present

## 2022-09-08 DIAGNOSIS — F17210 Nicotine dependence, cigarettes, uncomplicated: Secondary | ICD-10-CM | POA: Diagnosis not present

## 2022-09-08 DIAGNOSIS — C3411 Malignant neoplasm of upper lobe, right bronchus or lung: Secondary | ICD-10-CM

## 2022-09-08 DIAGNOSIS — C782 Secondary malignant neoplasm of pleura: Secondary | ICD-10-CM | POA: Diagnosis not present

## 2022-09-08 DIAGNOSIS — Z510 Encounter for antineoplastic radiation therapy: Secondary | ICD-10-CM | POA: Diagnosis not present

## 2022-09-08 DIAGNOSIS — C3431 Malignant neoplasm of lower lobe, right bronchus or lung: Secondary | ICD-10-CM | POA: Diagnosis not present

## 2022-09-08 DIAGNOSIS — C3491 Malignant neoplasm of unspecified part of right bronchus or lung: Secondary | ICD-10-CM

## 2022-09-08 DIAGNOSIS — Z5112 Encounter for antineoplastic immunotherapy: Secondary | ICD-10-CM | POA: Diagnosis not present

## 2022-09-08 DIAGNOSIS — C7951 Secondary malignant neoplasm of bone: Secondary | ICD-10-CM | POA: Diagnosis not present

## 2022-09-08 LAB — RAD ONC ARIA SESSION SUMMARY
Course Elapsed Days: 6
Plan Fractions Treated to Date: 5
Plan Prescribed Dose Per Fraction: 2.5 Gy
Plan Total Fractions Prescribed: 15
Plan Total Prescribed Dose: 37.5 Gy
Reference Point Dosage Given to Date: 12.5 Gy
Reference Point Session Dosage Given: 2.5 Gy
Session Number: 5

## 2022-09-08 LAB — CMP (CANCER CENTER ONLY)
ALT: 28 U/L (ref 0–44)
AST: 24 U/L (ref 15–41)
Albumin: 4 g/dL (ref 3.5–5.0)
Alkaline Phosphatase: 124 U/L (ref 38–126)
Anion gap: 10 (ref 5–15)
BUN: 22 mg/dL (ref 8–23)
CO2: 26 mmol/L (ref 22–32)
Calcium: 9.5 mg/dL (ref 8.9–10.3)
Chloride: 103 mmol/L (ref 98–111)
Creatinine: 1.16 mg/dL (ref 0.61–1.24)
GFR, Estimated: >60 mL/min (ref >60)
Glucose, Bld: 186 mg/dL — ABNORMAL HIGH (ref 70–99)
Potassium: 4.5 mmol/L (ref 3.5–5.1)
Sodium: 139 mmol/L (ref 135–145)
Total Bilirubin: 0.7 mg/dL (ref 0.3–1.2)
Total Protein: 7.8 g/dL (ref 6.5–8.1)

## 2022-09-08 LAB — CBC WITH DIFFERENTIAL (CANCER CENTER ONLY)
Abs Immature Granulocytes: 0.25 10*3/uL — ABNORMAL HIGH (ref 0.00–0.07)
Basophils Absolute: 0 10*3/uL (ref 0.0–0.1)
Basophils Relative: 0 %
Eosinophils Absolute: 0.3 10*3/uL (ref 0.0–0.5)
Eosinophils Relative: 3 %
HCT: 41.2 % (ref 39.0–52.0)
Hemoglobin: 14.2 g/dL (ref 13.0–17.0)
Immature Granulocytes: 3 %
Lymphocytes Relative: 12 %
Lymphs Abs: 1.2 10*3/uL (ref 0.7–4.0)
MCH: 31.1 pg (ref 26.0–34.0)
MCHC: 34.5 g/dL (ref 30.0–36.0)
MCV: 90.2 fL (ref 80.0–100.0)
Monocytes Absolute: 0.6 10*3/uL (ref 0.1–1.0)
Monocytes Relative: 6 %
Neutro Abs: 7.2 10*3/uL (ref 1.7–7.7)
Neutrophils Relative %: 76 %
Platelet Count: 117 10*3/uL — ABNORMAL LOW (ref 150–400)
RBC: 4.57 MIL/uL (ref 4.22–5.81)
RDW: 13.2 % (ref 11.5–15.5)
WBC Count: 9.6 10*3/uL (ref 4.0–10.5)
nRBC: 0 % (ref 0.0–0.2)

## 2022-09-08 NOTE — Progress Notes (Signed)
[place] Telephone:([phone]) [phone]   [phone]) [phone]  OFFICE PROGRESS NOTE  [name], MD [place] [address]  DIAGNOSIS: Recurrent lung cancer (T2a, N2, M1a), initially diagnosed with putative stage Ia T1 a, N0, M0 non-small cell lung cancer of the right upper lobe was treated in December 2021.  He had some recurrence and underwent SBRT to the right lower lobe nodule in March 2023.  In November 2023 the patient had his recurrence with multifocal thoracic metastatic disease there is a right lung pulmonary and pleural-based nodule and pleural-based right lower lobe region with partial destruction of the right seventh and ninth rib.   PRIOR THERAPY: SBRT to the right lower lobe lung nodule 12/02/21 to the care of Dr. Sondra Come   CURRENT THERAPY:  Systemic chemotherapy with carboplatin for an AUC of 5, Taxol 175 mg/m, and Libtayo IV every 3 weeks with neulasta support.  First dose was September 02, 2022.  Status post 1 cycle  INTERVAL HISTORY: Daniel Ball 66 y.o. male returns to the clinic today for follow-up visit accompanied by his daughter Levada Dy.  The patient tolerated the first week of his treatment with systemic chemotherapy fairly well except for the mild fatigue and 1 or 2 episodes of nausea.  He also has some aching pain after the Neulasta injection.  He denied having any current chest pain but has shortness of breath with exertion with mild cough and no hemoptysis.  He has no nausea, vomiting, diarrhea or constipation.  He has no headache or visual changes.  He is here today for evaluation and repeat blood work.  MEDICAL HISTORY: Past Medical History:  Diagnosis Date   Arthritis    Atrial fibrillation ([place]) 11/02/2019   COPD (chronic obstructive pulmonary disease) (HCC)    Coronary artery disease    a. 12/2019: s/p orbital atherectomy and DES placement to proximal/mid LAD.    Diabetes mellitus without complication ([place])     GERD (gastroesophageal reflux disease)    History of radiation therapy 09/11/20, 09/16/21, 09/18/20    Right lung- SBRT Dr. Sondra Come    Hyperlipidemia    Hypertension    hx HBP - MEDS DC'D 10 YRS since BP has been in normal range   Impingement syndrome of shoulder    left   Non-small cell cancer of right lung (HCC)    Shortness of breath    with exertion   Sleep apnea    uses a cpap   Spinal stenosis     ALLERGIES:  has No Known Allergies.  MEDICATIONS:  Current Outpatient Medications  Medication Sig Dispense Refill   albuterol (VENTOLIN HFA) 108 (90 Base) MCG/ACT inhaler Inhale 2 puffs into the lungs every 6 (six) hours as needed for wheezing or shortness of breath. 18 g 3   amLODipine (NORVASC) 5 MG tablet Take 1 tablet (5 mg total) by mouth daily. 90 tablet 3   apixaban (ELIQUIS) 5 MG TABS tablet Take 1 tablet (5 mg total) by mouth 2 (two) times daily. 180 tablet 3   buPROPion (WELLBUTRIN XL) 300 MG 24 hr tablet Take 300 mg by mouth daily.     clopidogrel (PLAVIX) 75 MG tablet Take 1 tablet (75 mg total) by mouth daily with breakfast. Okay to restart this medication on 10/21/2021 90 tablet 3   cyclobenzaprine (FLEXERIL) 10 MG tablet Take 20 mg by mouth daily as needed for muscle spasms.     furosemide (LASIX) 40  MG tablet TAKE 1 TABLET BY MOUTH ONCE DAILY AS NEEDED FOR  EDEMA  OR  WEIGHT  GAIN 90 tablet 3   isosorbide mononitrate (IMDUR) 60 MG 24 hr tablet Take 1 tablet (60 mg total) by mouth daily. 90 tablet 3   metFORMIN (GLUCOPHAGE) 500 MG tablet Take 1,000 mg by mouth 2 (two) times daily with a meal.     metoprolol succinate (TOPROL-XL) 25 MG 24 hr tablet Take 1 tablet (25 mg total) by mouth daily. 90 tablet 3   nitroGLYCERIN (NITROSTAT) 0.4 MG SL tablet Place 1 tablet (0.4 mg total) under the tongue every 5 (five) minutes x 3 doses as needed for chest pain. 25 tablet 2   pantoprazole (PROTONIX) 40 MG tablet Take 1 tablet (40 mg total) by mouth daily. 60 tablet 2   potassium  chloride SA (KLOR-CON M) 20 MEQ tablet TAKE 1 TABLET BY MOUTH ONCE DAILY AS NEEDED - TAKE ON THE DAYS YOU TAKE LASIX 90 tablet 3   prochlorperazine (COMPAZINE) 10 MG tablet Take 1 tablet (10 mg total) by mouth every 6 (six) hours as needed. 30 tablet 2   rosuvastatin (CRESTOR) 40 MG tablet Take 1 tablet (40 mg total) by mouth daily. 90 tablet 3   RYBELSUS 3 MG TABS Take 3 mg by mouth every morning. (Patient not taking: Reported on 08/19/2022)     umeclidinium-vilanterol (ANORO ELLIPTA) 62.5-25 MCG/ACT AEPB Inhale 1 puff into the lungs daily. (Patient not taking: Reported on 08/19/2022) 180 each 3   valsartan (DIOVAN) 320 MG tablet Take 320 mg by mouth daily.     No current facility-administered medications for this visit.    SURGICAL HISTORY:  Past Surgical History:  Procedure Laterality Date   BRONCHIAL BIOPSY  04/09/2020   Procedure: BRONCHIAL BIOPSIES;  Surgeon: Collene Gobble, MD;  Location: [facility] ENDOSCOPY;  Service: Pulmonary;;   BRONCHIAL BIOPSY  10/20/2021   Procedure: BRONCHIAL BIOPSIES;  Surgeon: Collene Gobble, MD;  Location: [facility] ENDOSCOPY;  Service: Pulmonary;;   BRONCHIAL BRUSHINGS  04/09/2020   Procedure: BRONCHIAL BRUSHINGS;  Surgeon: Collene Gobble, MD;  Location: [facility] ENDOSCOPY;  Service: Pulmonary;;   BRONCHIAL BRUSHINGS  07/30/2020   Procedure: BRONCHIAL BRUSHINGS;  Surgeon: Collene Gobble, MD;  Location: [facility] ENDOSCOPY;  Service: Pulmonary;;   BRONCHIAL BRUSHINGS  10/20/2021   Procedure: BRONCHIAL BRUSHINGS;  Surgeon: Collene Gobble, MD;  Location: [facility] ENDOSCOPY;  Service: Pulmonary;;   BRONCHIAL NEEDLE ASPIRATION BIOPSY  04/09/2020   Procedure: BRONCHIAL NEEDLE ASPIRATION BIOPSIES;  Surgeon: Collene Gobble, MD;  Location: [facility] ENDOSCOPY;  Service: Pulmonary;;   BRONCHIAL NEEDLE ASPIRATION BIOPSY  07/30/2020   Procedure: BRONCHIAL NEEDLE ASPIRATION BIOPSIES;  Surgeon: Collene Gobble, MD;  Location: [facility] ENDOSCOPY;  Service: Pulmonary;;   BRONCHIAL NEEDLE ASPIRATION BIOPSY  10/20/2021    Procedure: BRONCHIAL NEEDLE ASPIRATION BIOPSIES;  Surgeon: Collene Gobble, MD;  Location: [facility] ENDOSCOPY;  Service: Pulmonary;;   BRONCHIAL WASHINGS  04/09/2020   Procedure: BRONCHIAL WASHINGS;  Surgeon: Collene Gobble, MD;  Location: [place];  Service: Pulmonary;;   BRONCHIAL WASHINGS  07/30/2020   Procedure: BRONCHIAL WASHINGS;  Surgeon: Collene Gobble, MD;  Location: [place];  Service: Pulmonary;;   BRONCHIAL WASHINGS  10/20/2021   Procedure: BRONCHIAL WASHINGS;  Surgeon: Collene Gobble, MD;  Location: [facility] ENDOSCOPY;  Service: Pulmonary;;   CORONARY ATHERECTOMY N/A 01/18/2020   Procedure: CORONARY ATHERECTOMY;  Surgeon: Nelva Bush, MD;  Location: [place] CV LAB;  Service: Cardiovascular;  Laterality: N/A;  CORONARY STENT INTERVENTION  01/18/2020   CORONARY STENT INTERVENTION N/A 01/18/2020   Procedure: CORONARY STENT INTERVENTION;  Surgeon: Nelva Bush, MD;  Location: [place] CV LAB;  Service: Cardiovascular;  Laterality: N/A;   EYE SURGERY Left    FIDUCIAL MARKER PLACEMENT  04/09/2020   Procedure: FIDUCIAL MARKER PLACEMENT;  Surgeon: Collene Gobble, MD;  Location: [place];  Service: Pulmonary;;   FIDUCIAL MARKER PLACEMENT  10/20/2021   Procedure: FIDUCIAL MARKER PLACEMENT;  Surgeon: Collene Gobble, MD;  Location: [facility] ENDOSCOPY;  Service: Pulmonary;;   FINE NEEDLE ASPIRATION  04/09/2020   Procedure: FINE NEEDLE ASPIRATION (FNA) LINEAR;  Surgeon: Collene Gobble, MD;  Location: [place] ENDOSCOPY;  Service: Pulmonary;;   HAND SURGERY Right    thumb   INTRAVASCULAR PRESSURE WIRE/FFR STUDY N/A 11/02/2019   Procedure: INTRAVASCULAR PRESSURE WIRE/FFR STUDY;  Surgeon: Nelva Bush, MD;  Location: [place] CV LAB;  Service: Cardiovascular;  Laterality: N/A;   INTRAVASCULAR ULTRASOUND/IVUS N/A 01/18/2020   Procedure: Intravascular Ultrasound/IVUS;  Surgeon: Nelva Bush, MD;  Location: [place] CV LAB;  Service: Cardiovascular;  Laterality: N/A;   LUMBAR  LAMINECTOMY/DECOMPRESSION MICRODISCECTOMY N/A 02/07/2014   Procedure: LUMBAR DECOMPRESSION Lumbar one-Lumbar five;  Surgeon: [name], MD;  Location: [facility] ORS;  Service: Orthopedics;  Laterality: N/A;   RIGHT/LEFT HEART CATH AND CORONARY ANGIOGRAPHY N/A 11/02/2019   Procedure: RIGHT/LEFT HEART CATH AND CORONARY ANGIOGRAPHY;  Surgeon: Nelva Bush, MD;  Location: [place] CV LAB;  Service: Cardiovascular;  Laterality: N/A;   SHOULDER ARTHROSCOPY WITH SUBACROMIAL DECOMPRESSION Left 04/12/2014   Procedure: LEFT SHOULDER ARTHROSCOPY WITH SUBACROMIAL DECOMPRESSION AND LABRAL DEBRIDEMENT, ROTATOR CUFF DEBRIDEMENT, BICEPS DEBRIDEMENT;  Surgeon: [name], MD;  Location: [facility] ORS;  Service: Orthopedics;  Laterality: Left;   VIDEO BRONCHOSCOPY WITH ENDOBRONCHIAL NAVIGATION N/A 04/09/2020   Procedure: VIDEO BRONCHOSCOPY WITH ENDOBRONCHIAL NAVIGATION;  Surgeon: Collene Gobble, MD;  Location: [place] ENDOSCOPY;  Service: Pulmonary;  Laterality: N/A;   VIDEO BRONCHOSCOPY WITH ENDOBRONCHIAL NAVIGATION Right 07/30/2020   Procedure: VIDEO BRONCHOSCOPY WITH ENDOBRONCHIAL NAVIGATION;  Surgeon: Collene Gobble, MD;  Location: [place] ENDOSCOPY;  Service: Pulmonary;  Laterality: Right;   VIDEO BRONCHOSCOPY WITH ENDOBRONCHIAL ULTRASOUND N/A 04/09/2020   Procedure: VIDEO BRONCHOSCOPY WITH ENDOBRONCHIAL ULTRASOUND;  Surgeon: Collene Gobble, MD;  Location: [facility] ENDOSCOPY;  Service: Pulmonary;  Laterality: N/A;   VIDEO BRONCHOSCOPY WITH RADIAL ENDOBRONCHIAL ULTRASOUND  04/09/2020   Procedure: VIDEO BRONCHOSCOPY WITH RADIAL ENDOBRONCHIAL ULTRASOUND;  Surgeon: Collene Gobble, MD;  Location: [facility] ENDOSCOPY;  Service: Pulmonary;;   VIDEO BRONCHOSCOPY WITH RADIAL ENDOBRONCHIAL ULTRASOUND  10/20/2021   Procedure: VIDEO BRONCHOSCOPY WITH RADIAL ENDOBRONCHIAL ULTRASOUND;  Surgeon: Collene Gobble, MD;  Location: [facility] ENDOSCOPY;  Service: Pulmonary;;    REVIEW OF SYSTEMS:  A comprehensive review of systems was negative except for:  Constitutional: positive for fatigue Respiratory: positive for dyspnea on exertion   PHYSICAL EXAMINATION: General appearance: alert, cooperative, and no distress Head: Normocephalic, without obvious abnormality, atraumatic Neck: no adenopathy, no JVD, supple, symmetrical, trachea midline, and thyroid not enlarged, symmetric, no tenderness/mass/nodules Lymph nodes: Cervical, supraclavicular, and axillary nodes normal. Resp: clear to auscultation bilaterally Back: symmetric, no curvature. ROM normal. No CVA tenderness. Cardio: regular rate and rhythm, S1, S2 normal, no murmur, click, rub or gallop GI: soft, non-tender; bowel sounds normal; no masses,  no organomegaly Extremities: extremities normal, atraumatic, no cyanosis or edema  ECOG PERFORMANCE STATUS: 1 - Symptomatic but completely ambulatory  Blood pressure 117/76, pulse 72, temperature 98 F (36.7 C), temperature  source Oral, resp. rate 18, weight 244 lb 6.4 oz (110.9 kg), SpO2 100 %.  LABORATORY DATA: Lab Results  Component Value Date   WBC 9.6 09/08/2022   HGB 14.2 09/08/2022   HCT 41.2 09/08/2022   MCV 90.2 09/08/2022   PLT 117 (L) 09/08/2022      Chemistry      Component Value Date/Time   NA 138 09/01/2022 1247   NA 139 12/21/2019 0730   K 4.3 09/01/2022 1247   CL 103 09/01/2022 1247   CO2 27 09/01/2022 1247   BUN 15 09/01/2022 1247   BUN 18 12/21/2019 0730   CREATININE 0.99 09/01/2022 1247      Component Value Date/Time   CALCIUM 10.0 09/01/2022 1247   ALKPHOS 87 09/01/2022 1247   AST 19 09/01/2022 1247   ALT 19 09/01/2022 1247   BILITOT 0.4 09/01/2022 1247       RADIOGRAPHIC STUDIES: MR Brain W Wo Contrast  Result Date: 09/01/2022 CLINICAL DATA:  Lung carcinoma staging EXAM: MRI HEAD WITHOUT AND WITH CONTRAST TECHNIQUE: Multiplanar, multiecho pulse sequences of the brain and surrounding structures were obtained without and with intravenous contrast. CONTRAST:  53mL GADAVIST GADOBUTROL 1 MMOL/ML IV  SOLN COMPARISON:  None Available. FINDINGS: Brain: No acute infarct, mass effect or extra-axial collection. No acute or chronic hemorrhage. There is multifocal hyperintense T2-weighted signal within the white matter. Parenchymal volume and CSF spaces are normal. The midline structures are normal. Vascular: Major flow voids are preserved. Skull and upper cervical spine: Normal calvarium and skull base. Visualized upper cervical spine and soft tissues are normal. Sinuses/Orbits:No paranasal sinus fluid levels or advanced mucosal thickening. No mastoid or middle ear effusion. Normal orbits. IMPRESSION: 1. No intracranial metastatic disease. 2. Findings of chronic small vessel ischemia. Electronically Signed   By: Ulyses Jarred M.D.   On: 09/01/2022 02:49   CT BONE TROCAR/NEEDLE BIOPSY DEEP  Result Date: 08/19/2022 INDICATION: Chest mass EXAM: CT-guided core needle biopsy of right posterior chest wall mass MEDICATIONS: None. ANESTHESIA/SEDATION: Moderate (conscious) sedation was employed during this procedure. A total of Versed 1 mg and Fentanyl 75 mcg was administered intravenously. Moderate Sedation Time: 17 minutes. The patient's level of consciousness and vital signs were monitored continuously by radiology nursing throughout the procedure under my direct supervision. FLUOROSCOPY TIME:  N/a COMPLICATIONS: None immediate. PROCEDURE: Informed written consent was obtained from the patient after a thorough discussion of the procedural risks, benefits and alternatives. All questions were addressed. Maximal Sterile Barrier Technique was utilized including caps, mask, sterile gowns, sterile gloves, sterile drape, hand hygiene and skin antiseptic. A timeout was performed prior to the initiation of the procedure. The patient was placed prone on the exam table. Limited CT of the chest was performed for planning purposes. This demonstrated right posterior chest wall mass abutting the pleura with rib involvement. Skin  entry site was marked, and the overlying skin was prepped and draped in the standard sterile fashion. Local analgesia was obtained with 1% lidocaine. Using intermittent CT fluoroscopy, a 17 gauge introducer needle was advanced towards the identified lesion. Subsequently, core needle biopsy was performed using an 18 gauge core biopsy device x4 total passes. Specimens were submitted in formalin to pathology for further handling. Limited postprocedure imaging demonstrated no complicating feature. The patient tolerated the procedure well, and was transferred to recovery in stable condition. IMPRESSION: Successful CT-guided core needle biopsy of right posterior chest wall mass with pleural and rib involvement. Electronically Signed   By: Albin Felling  M.D.   On: 08/19/2022 13:20    ASSESSMENT AND PLAN: This is a very pleasant 66 years old white male with Recurrent lung cancer (T2a, N2, M1a), initially diagnosed with putative stage Ia T1 a, N0, M0 non-small cell lung cancer of the right upper lobe was treated in December 2021.  He had some recurrence and underwent SBRT to the right lower lobe nodule in March 2023.  In November 2023 the patient had his recurrence with multifocal thoracic metastatic disease there is a right lung pulmonary and pleural-based nodule and pleural-based right lower lobe region with partial destruction of the right seventh and ninth rib.  The patient is currently undergoing palliative radiotherapy to the destructive lesion and the right ribs. He is also undergoing systemic chemotherapy with carboplatin for AUC of 5, paclitaxel 175 Mg/M2 and Libtayo (Cempilimab) 300 Mg IV every 3 weeks status post 1 cycle. The patient tolerated the first week of his treatment fairly well with no concerning adverse effects except for mild fatigue. His lab work is unremarkable today for any concerning abnormalities. I recommended for the patient to continue his treatment as planned and he is expected to start  cycle #2 in around 2 weeks from now. I will see him back for follow-up visit at that time. The patient was advised to call immediately if he has any other concerning symptoms in the interval. The patient voices understanding of current disease status and treatment options and is in agreement with the current care plan.  All questions were answered. The patient knows to call the clinic with any problems, questions or concerns. We can certainly see the patient much sooner if necessary.  The total time spent in the appointment was 20 minutes.  Disclaimer: This note was dictated with voice recognition software. Similar sounding words can inadvertently be transcribed and may not be corrected upon review.

## 2022-09-09 ENCOUNTER — Other Ambulatory Visit: Payer: Self-pay | Admitting: Physician Assistant

## 2022-09-09 ENCOUNTER — Encounter: Payer: Self-pay | Admitting: Internal Medicine

## 2022-09-09 ENCOUNTER — Other Ambulatory Visit: Payer: Self-pay

## 2022-09-09 ENCOUNTER — Ambulatory Visit
Admission: RE | Admit: 2022-09-09 | Discharge: 2022-09-09 | Disposition: A | Discharge status: Home / Self Care | Payer: Medicare Other | Source: Ambulatory Visit | Attending: Radiation Oncology | Admitting: Radiation Oncology

## 2022-09-09 DIAGNOSIS — C7951 Secondary malignant neoplasm of bone: Secondary | ICD-10-CM | POA: Diagnosis not present

## 2022-09-09 DIAGNOSIS — C782 Secondary malignant neoplasm of pleura: Secondary | ICD-10-CM | POA: Diagnosis not present

## 2022-09-09 DIAGNOSIS — F17210 Nicotine dependence, cigarettes, uncomplicated: Secondary | ICD-10-CM | POA: Diagnosis not present

## 2022-09-09 DIAGNOSIS — Z5112 Encounter for antineoplastic immunotherapy: Secondary | ICD-10-CM | POA: Diagnosis not present

## 2022-09-09 DIAGNOSIS — C3431 Malignant neoplasm of lower lobe, right bronchus or lung: Secondary | ICD-10-CM | POA: Diagnosis not present

## 2022-09-09 DIAGNOSIS — Z510 Encounter for antineoplastic radiation therapy: Secondary | ICD-10-CM | POA: Diagnosis not present

## 2022-09-09 DIAGNOSIS — C3411 Malignant neoplasm of upper lobe, right bronchus or lung: Secondary | ICD-10-CM | POA: Diagnosis not present

## 2022-09-09 DIAGNOSIS — C3491 Malignant neoplasm of unspecified part of right bronchus or lung: Secondary | ICD-10-CM

## 2022-09-09 LAB — RAD ONC ARIA SESSION SUMMARY
Course Elapsed Days: 7
Plan Fractions Treated to Date: 6
Plan Prescribed Dose Per Fraction: 2.5 Gy
Plan Total Fractions Prescribed: 15
Plan Total Prescribed Dose: 37.5 Gy
Reference Point Dosage Given to Date: 15 Gy
Reference Point Session Dosage Given: 2.5 Gy
Session Number: 6

## 2022-09-10 ENCOUNTER — Other Ambulatory Visit: Payer: Self-pay

## 2022-09-10 ENCOUNTER — Ambulatory Visit
Admission: RE | Admit: 2022-09-10 | Discharge: 2022-09-10 | Disposition: A | Discharge status: Home / Self Care | Payer: Medicare Other | Source: Ambulatory Visit | Attending: Radiation Oncology | Admitting: Radiation Oncology

## 2022-09-10 DIAGNOSIS — C782 Secondary malignant neoplasm of pleura: Secondary | ICD-10-CM | POA: Diagnosis not present

## 2022-09-10 DIAGNOSIS — Z510 Encounter for antineoplastic radiation therapy: Secondary | ICD-10-CM | POA: Diagnosis not present

## 2022-09-10 DIAGNOSIS — F17210 Nicotine dependence, cigarettes, uncomplicated: Secondary | ICD-10-CM | POA: Diagnosis not present

## 2022-09-10 DIAGNOSIS — C7951 Secondary malignant neoplasm of bone: Secondary | ICD-10-CM | POA: Diagnosis not present

## 2022-09-10 DIAGNOSIS — C3411 Malignant neoplasm of upper lobe, right bronchus or lung: Secondary | ICD-10-CM | POA: Diagnosis not present

## 2022-09-10 DIAGNOSIS — C3431 Malignant neoplasm of lower lobe, right bronchus or lung: Secondary | ICD-10-CM | POA: Diagnosis not present

## 2022-09-10 DIAGNOSIS — Z5112 Encounter for antineoplastic immunotherapy: Secondary | ICD-10-CM | POA: Diagnosis not present

## 2022-09-10 LAB — RAD ONC ARIA SESSION SUMMARY
Course Elapsed Days: 8
Plan Fractions Treated to Date: 7
Plan Prescribed Dose Per Fraction: 2.5 Gy
Plan Total Fractions Prescribed: 15
Plan Total Prescribed Dose: 37.5 Gy
Reference Point Dosage Given to Date: 17.5 Gy
Reference Point Session Dosage Given: 2.5 Gy
Session Number: 7

## 2022-09-10 NOTE — Progress Notes (Signed)
Called patient regarding upcoming EGD and colonoscopy to complete pre-op phone call. Per pt he has been recently diagnosed with stage IV lung cancer and is wondering if it makes sense to have the EGD and colonoscopy done. Instructed patient to call the surgeons office and inform them what is going on and if he plans to have procedure we will call him at a later time. Patient verbalized understanding.

## 2022-09-11 ENCOUNTER — Other Ambulatory Visit: Payer: Self-pay

## 2022-09-11 ENCOUNTER — Ambulatory Visit
Admission: RE | Admit: 2022-09-11 | Discharge: 2022-09-11 | Disposition: A | Discharge status: Home / Self Care | Payer: Medicare Other | Source: Ambulatory Visit | Attending: Radiation Oncology | Admitting: Radiation Oncology

## 2022-09-11 DIAGNOSIS — F17210 Nicotine dependence, cigarettes, uncomplicated: Secondary | ICD-10-CM | POA: Diagnosis not present

## 2022-09-11 DIAGNOSIS — C3431 Malignant neoplasm of lower lobe, right bronchus or lung: Secondary | ICD-10-CM | POA: Diagnosis not present

## 2022-09-11 DIAGNOSIS — C7951 Secondary malignant neoplasm of bone: Secondary | ICD-10-CM | POA: Diagnosis not present

## 2022-09-11 DIAGNOSIS — C782 Secondary malignant neoplasm of pleura: Secondary | ICD-10-CM | POA: Diagnosis not present

## 2022-09-11 DIAGNOSIS — Z510 Encounter for antineoplastic radiation therapy: Secondary | ICD-10-CM | POA: Diagnosis not present

## 2022-09-11 DIAGNOSIS — C3411 Malignant neoplasm of upper lobe, right bronchus or lung: Secondary | ICD-10-CM | POA: Diagnosis not present

## 2022-09-11 DIAGNOSIS — Z5112 Encounter for antineoplastic immunotherapy: Secondary | ICD-10-CM | POA: Diagnosis not present

## 2022-09-11 LAB — RAD ONC ARIA SESSION SUMMARY
Course Elapsed Days: 9
Plan Fractions Treated to Date: 8
Plan Prescribed Dose Per Fraction: 2.5 Gy
Plan Total Fractions Prescribed: 15
Plan Total Prescribed Dose: 37.5 Gy
Reference Point Dosage Given to Date: 20 Gy
Reference Point Session Dosage Given: 2.5 Gy
Session Number: 8

## 2022-09-15 ENCOUNTER — Other Ambulatory Visit: Payer: Self-pay

## 2022-09-15 ENCOUNTER — Emergency Department (HOSPITAL_COMMUNITY): Payer: Medicare Other

## 2022-09-15 ENCOUNTER — Encounter (HOSPITAL_COMMUNITY): Payer: Self-pay

## 2022-09-15 ENCOUNTER — Observation Stay (HOSPITAL_COMMUNITY)
Admission: EM | Admit: 2022-09-15 | Discharge: 2022-09-16 | Disposition: A | Discharge status: Home / Self Care | Payer: Medicare Other | Attending: Family Medicine | Admitting: Family Medicine

## 2022-09-15 ENCOUNTER — Observation Stay (HOSPITAL_BASED_OUTPATIENT_CLINIC_OR_DEPARTMENT_OTHER): Payer: Medicare Other

## 2022-09-15 ENCOUNTER — Ambulatory Visit: Payer: Medicare Other

## 2022-09-15 DIAGNOSIS — Z79899 Other long term (current) drug therapy: Secondary | ICD-10-CM | POA: Insufficient documentation

## 2022-09-15 DIAGNOSIS — K5730 Diverticulosis of large intestine without perforation or abscess without bleeding: Secondary | ICD-10-CM | POA: Diagnosis not present

## 2022-09-15 DIAGNOSIS — D696 Thrombocytopenia, unspecified: Secondary | ICD-10-CM | POA: Diagnosis not present

## 2022-09-15 DIAGNOSIS — R351 Nocturia: Secondary | ICD-10-CM | POA: Insufficient documentation

## 2022-09-15 DIAGNOSIS — Z1152 Encounter for screening for COVID-19: Secondary | ICD-10-CM | POA: Insufficient documentation

## 2022-09-15 DIAGNOSIS — F17210 Nicotine dependence, cigarettes, uncomplicated: Secondary | ICD-10-CM | POA: Diagnosis not present

## 2022-09-15 DIAGNOSIS — G4733 Obstructive sleep apnea (adult) (pediatric): Secondary | ICD-10-CM | POA: Diagnosis present

## 2022-09-15 DIAGNOSIS — I480 Paroxysmal atrial fibrillation: Principal | ICD-10-CM | POA: Insufficient documentation

## 2022-09-15 DIAGNOSIS — I10 Essential (primary) hypertension: Secondary | ICD-10-CM | POA: Diagnosis present

## 2022-09-15 DIAGNOSIS — I2510 Atherosclerotic heart disease of native coronary artery without angina pectoris: Secondary | ICD-10-CM | POA: Insufficient documentation

## 2022-09-15 DIAGNOSIS — R0602 Shortness of breath: Secondary | ICD-10-CM | POA: Diagnosis not present

## 2022-09-15 DIAGNOSIS — E119 Type 2 diabetes mellitus without complications: Secondary | ICD-10-CM | POA: Diagnosis not present

## 2022-09-15 DIAGNOSIS — Z7984 Long term (current) use of oral hypoglycemic drugs: Secondary | ICD-10-CM | POA: Diagnosis not present

## 2022-09-15 DIAGNOSIS — I25118 Atherosclerotic heart disease of native coronary artery with other forms of angina pectoris: Secondary | ICD-10-CM | POA: Diagnosis present

## 2022-09-15 DIAGNOSIS — K449 Diaphragmatic hernia without obstruction or gangrene: Secondary | ICD-10-CM | POA: Diagnosis not present

## 2022-09-15 DIAGNOSIS — I4891 Unspecified atrial fibrillation: Secondary | ICD-10-CM | POA: Diagnosis not present

## 2022-09-15 DIAGNOSIS — J449 Chronic obstructive pulmonary disease, unspecified: Secondary | ICD-10-CM | POA: Insufficient documentation

## 2022-09-15 DIAGNOSIS — Z7901 Long term (current) use of anticoagulants: Secondary | ICD-10-CM | POA: Insufficient documentation

## 2022-09-15 DIAGNOSIS — Z23 Encounter for immunization: Secondary | ICD-10-CM | POA: Diagnosis not present

## 2022-09-15 DIAGNOSIS — C3490 Malignant neoplasm of unspecified part of unspecified bronchus or lung: Secondary | ICD-10-CM | POA: Insufficient documentation

## 2022-09-15 DIAGNOSIS — J849 Interstitial pulmonary disease, unspecified: Secondary | ICD-10-CM | POA: Diagnosis not present

## 2022-09-15 DIAGNOSIS — K219 Gastro-esophageal reflux disease without esophagitis: Secondary | ICD-10-CM | POA: Diagnosis present

## 2022-09-15 DIAGNOSIS — Z7902 Long term (current) use of antithrombotics/antiplatelets: Secondary | ICD-10-CM | POA: Insufficient documentation

## 2022-09-15 LAB — URINALYSIS, ROUTINE W REFLEX MICROSCOPIC
Glucose, UA: NEGATIVE mg/dL
Hgb urine dipstick: NEGATIVE
Ketones, ur: NEGATIVE mg/dL
Leukocytes,Ua: NEGATIVE
Nitrite: NEGATIVE
Protein, ur: NEGATIVE mg/dL
Specific Gravity, Urine: 1.005 (ref 1.005–1.030)
pH: 6 (ref 5.0–8.0)

## 2022-09-15 LAB — CBC WITH DIFFERENTIAL/PLATELET
Abs Immature Granulocytes: 0.75 10*3/uL — ABNORMAL HIGH (ref 0.00–0.07)
Basophils Absolute: 0.1 10*3/uL (ref 0.0–0.1)
Basophils Relative: 1 %
Eosinophils Absolute: 0 10*3/uL (ref 0.0–0.5)
Eosinophils Relative: 0 %
HCT: 40.8 % (ref 39.0–52.0)
Hemoglobin: 13.9 g/dL (ref 13.0–17.0)
Immature Granulocytes: 9 %
Lymphocytes Relative: 5 %
Lymphs Abs: 0.4 10*3/uL — ABNORMAL LOW (ref 0.7–4.0)
MCH: 31.2 pg (ref 26.0–34.0)
MCHC: 34.1 g/dL (ref 30.0–36.0)
MCV: 91.7 fL (ref 80.0–100.0)
Monocytes Absolute: 0.8 10*3/uL (ref 0.1–1.0)
Monocytes Relative: 9 %
Neutro Abs: 6.4 10*3/uL (ref 1.7–7.7)
Neutrophils Relative %: 76 %
Platelets: 72 10*3/uL — ABNORMAL LOW (ref 150–400)
RBC: 4.45 MIL/uL (ref 4.22–5.81)
RDW: 14.3 % (ref 11.5–15.5)
WBC: 8.5 10*3/uL (ref 4.0–10.5)
nRBC: 0 % (ref 0.0–0.2)

## 2022-09-15 LAB — HEPATIC FUNCTION PANEL
ALT: 43 U/L (ref 0–44)
AST: 38 U/L (ref 15–41)
Albumin: 3.7 g/dL (ref 3.5–5.0)
Alkaline Phosphatase: 125 U/L (ref 38–126)
Bilirubin, Direct: 0.2 mg/dL (ref 0.0–0.2)
Indirect Bilirubin: 0.6 mg/dL (ref 0.3–0.9)
Total Bilirubin: 0.8 mg/dL (ref 0.3–1.2)
Total Protein: 7.6 g/dL (ref 6.5–8.1)

## 2022-09-15 LAB — RESP PANEL BY RT-PCR (RSV, FLU A&B, COVID)  RVPGX2
Influenza A by PCR: NEGATIVE
Influenza B by PCR: NEGATIVE
Resp Syncytial Virus by PCR: NEGATIVE
SARS Coronavirus 2 by RT PCR: NEGATIVE

## 2022-09-15 LAB — BASIC METABOLIC PANEL
Anion gap: 13 (ref 5–15)
BUN: 15 mg/dL (ref 8–23)
CO2: 22 mmol/L (ref 22–32)
Calcium: 9 mg/dL (ref 8.9–10.3)
Chloride: 98 mmol/L (ref 98–111)
Creatinine, Ser: 1.03 mg/dL (ref 0.61–1.24)
GFR, Estimated: >60 mL/min (ref >60)
Glucose, Bld: 172 mg/dL — ABNORMAL HIGH (ref 70–99)
Potassium: 4 mmol/L (ref 3.5–5.1)
Sodium: 133 mmol/L — ABNORMAL LOW (ref 135–145)

## 2022-09-15 LAB — TSH: TSH: 1.511 u[IU]/mL (ref 0.350–4.500)

## 2022-09-15 LAB — ECHOCARDIOGRAM COMPLETE
Area-P 1/2: 4.53 cm2
Calc EF: 56.2 %
S' Lateral: 2.7 cm
Single Plane A2C EF: 61.4 %
Single Plane A4C EF: 51.2 %

## 2022-09-15 LAB — BRAIN NATRIURETIC PEPTIDE: B Natriuretic Peptide: 220.9 pg/mL — ABNORMAL HIGH (ref 0.0–100.0)

## 2022-09-15 LAB — CBG MONITORING, ED
Glucose-Capillary: 156 mg/dL — ABNORMAL HIGH (ref 70–99)
Glucose-Capillary: 168 mg/dL — ABNORMAL HIGH (ref 70–99)

## 2022-09-15 LAB — GLUCOSE, CAPILLARY: Glucose-Capillary: 155 mg/dL — ABNORMAL HIGH (ref 70–99)

## 2022-09-15 LAB — LIPASE, BLOOD: Lipase: 24 U/L (ref 11–51)

## 2022-09-15 LAB — PHOSPHORUS: Phosphorus: 2.9 mg/dL (ref 2.5–4.6)

## 2022-09-15 LAB — MAGNESIUM: Magnesium: 1.6 mg/dL — ABNORMAL LOW (ref 1.7–2.4)

## 2022-09-15 LAB — TROPONIN I (HIGH SENSITIVITY)
Troponin I (High Sensitivity): 14 ng/L (ref <18)
Troponin I (High Sensitivity): 14 ng/L (ref <18)

## 2022-09-15 MED ORDER — POTASSIUM CHLORIDE CRYS ER 20 MEQ PO TBCR
20.0000 meq | EXTENDED_RELEASE_TABLET | Freq: Every day | ORAL | Status: DC
  Administered 2022-09-15 – 2022-09-16 (×2): 20 meq via ORAL
  Filled 2022-09-15 (×2): qty 1

## 2022-09-15 MED ORDER — METFORMIN HCL ER 500 MG PO TB24: 1000.0000 mg | ORAL_TABLET | Freq: Two times a day (BID) | ORAL | Status: DC

## 2022-09-15 MED ORDER — INFLUENZA VAC A&B SA ADJ QUAD 0.5 ML IM PRSY
0.5000 mL | PREFILLED_SYRINGE | INTRAMUSCULAR | Status: AC
  Administered 2022-09-16: 0.5 mL via INTRAMUSCULAR
  Filled 2022-09-15: qty 0.5

## 2022-09-15 MED ORDER — BUPROPION HCL ER (XL) 300 MG PO TB24
300.0000 mg | ORAL_TABLET | Freq: Every day | ORAL | Status: DC
  Administered 2022-09-15 – 2022-09-16 (×2): 300 mg via ORAL
  Filled 2022-09-15: qty 1
  Filled 2022-09-15: qty 2

## 2022-09-15 MED ORDER — PANTOPRAZOLE SODIUM 40 MG PO TBEC
40.0000 mg | DELAYED_RELEASE_TABLET | Freq: Every day | ORAL | Status: DC
  Administered 2022-09-16: 40 mg via ORAL
  Filled 2022-09-15: qty 1

## 2022-09-15 MED ORDER — ACETAMINOPHEN 650 MG RE SUPP: 650.0000 mg | Freq: Four times a day (QID) | RECTAL | Status: DC | PRN

## 2022-09-15 MED ORDER — ALUM & MAG HYDROXIDE-SIMETH 200-200-20 MG/5ML PO SUSP
30.0000 mL | Freq: Once | ORAL | Status: DC
  Filled 2022-09-15: qty 30

## 2022-09-15 MED ORDER — SODIUM CHLORIDE (PF) 0.9 % IJ SOLN
INTRAMUSCULAR | Status: AC
  Filled 2022-09-15: qty 50

## 2022-09-15 MED ORDER — PERFLUTREN LIPID MICROSPHERE
1.0000 mL | INTRAVENOUS | Status: AC | PRN
  Administered 2022-09-15: 3 mL via INTRAVENOUS

## 2022-09-15 MED ORDER — METOPROLOL TARTRATE 5 MG/5ML IV SOLN
5.0000 mg | Freq: Four times a day (QID) | INTRAVENOUS | Status: DC | PRN
  Administered 2022-09-15: 5 mg via INTRAVENOUS
  Filled 2022-09-15: qty 5

## 2022-09-15 MED ORDER — METOPROLOL TARTRATE 5 MG/5ML IV SOLN
5.0000 mg | Freq: Once | INTRAVENOUS | Status: AC
  Administered 2022-09-15: 5 mg via INTRAVENOUS
  Filled 2022-09-15: qty 5

## 2022-09-15 MED ORDER — PROCHLORPERAZINE EDISYLATE 10 MG/2ML IJ SOLN: 10.0000 mg | Freq: Four times a day (QID) | INTRAMUSCULAR | Status: DC | PRN

## 2022-09-15 MED ORDER — NITROGLYCERIN 0.4 MG SL SUBL: 0.4000 mg | SUBLINGUAL_TABLET | SUBLINGUAL | Status: DC | PRN

## 2022-09-15 MED ORDER — ISOSORBIDE MONONITRATE ER 60 MG PO TB24
60.0000 mg | ORAL_TABLET | Freq: Every day | ORAL | Status: DC
  Administered 2022-09-15 – 2022-09-16 (×2): 60 mg via ORAL
  Filled 2022-09-15 (×2): qty 1

## 2022-09-15 MED ORDER — ROSUVASTATIN CALCIUM 20 MG PO TABS
40.0000 mg | ORAL_TABLET | Freq: Every day | ORAL | Status: DC
  Administered 2022-09-15 – 2022-09-16 (×2): 40 mg via ORAL
  Filled 2022-09-15 (×2): qty 2

## 2022-09-15 MED ORDER — IRBESARTAN 150 MG PO TABS
150.0000 mg | ORAL_TABLET | Freq: Every day | ORAL | Status: DC
  Administered 2022-09-15: 150 mg via ORAL
  Filled 2022-09-15: qty 1

## 2022-09-15 MED ORDER — ALUM & MAG HYDROXIDE-SIMETH 200-200-20 MG/5ML PO SUSP
30.0000 mL | ORAL | Status: DC | PRN
  Administered 2022-09-15: 30 mL via ORAL
  Filled 2022-09-15: qty 30

## 2022-09-15 MED ORDER — ACETAMINOPHEN 325 MG PO TABS: 650.0000 mg | ORAL_TABLET | Freq: Four times a day (QID) | ORAL | Status: DC | PRN

## 2022-09-15 MED ORDER — FUROSEMIDE 40 MG PO TABS
40.0000 mg | ORAL_TABLET | Freq: Every day | ORAL | Status: DC
  Administered 2022-09-16: 40 mg via ORAL
  Filled 2022-09-15: qty 1

## 2022-09-15 MED ORDER — MAGNESIUM SULFATE 2 GM/50ML IV SOLN
2.0000 g | Freq: Once | INTRAVENOUS | Status: AC
  Administered 2022-09-15: 2 g via INTRAVENOUS
  Filled 2022-09-15: qty 50

## 2022-09-15 MED ORDER — IOHEXOL 350 MG/ML SOLN
100.0000 mL | Freq: Once | INTRAVENOUS | Status: AC | PRN
  Administered 2022-09-15: 100 mL via INTRAVENOUS

## 2022-09-15 MED ORDER — APIXABAN 5 MG PO TABS
5.0000 mg | ORAL_TABLET | Freq: Two times a day (BID) | ORAL | Status: DC
  Administered 2022-09-15 – 2022-09-16 (×3): 5 mg via ORAL
  Filled 2022-09-15 (×3): qty 1

## 2022-09-15 MED ORDER — METOPROLOL SUCCINATE ER 25 MG PO TB24
25.0000 mg | ORAL_TABLET | Freq: Every day | ORAL | Status: DC
  Administered 2022-09-15 – 2022-09-16 (×2): 25 mg via ORAL
  Filled 2022-09-15 (×2): qty 1

## 2022-09-15 MED ORDER — METOPROLOL TARTRATE 5 MG/5ML IV SOLN
2.5000 mg | Freq: Once | INTRAVENOUS | Status: AC
  Administered 2022-09-15: 2.5 mg via INTRAVENOUS
  Filled 2022-09-15: qty 5

## 2022-09-15 MED ORDER — INSULIN ASPART 100 UNIT/ML IJ SOLN: 0.0000 [IU] | Freq: Three times a day (TID) | INTRAMUSCULAR | Status: DC

## 2022-09-15 NOTE — ED Provider Notes (Addendum)
Patient was initially seen by Dr. Ralene Bathe.  Please see her note.  Patient has been having trouble with poor sleep and some chest discomfort.  He has had a cough.  Clinical Course as of 09/15/22 0954  Tue Sep 15, 2022  0925 CT scan of chest without evidence of PE.  Multiple metastatic lesions noted in the right hemithorax.  No acute process noted in the abdomen [JK]    Clinical Course User Index [JK] Dorie Rank, MD    No evidence of PE or acute process on his CT scans.  Noted to be persistently tachycardic, a fib.  Discussed options of ed cardioversion.  Pt has not had that done before.  Would prefer to discuss with his cardiologist.  Additional metoprolol ordered.  Will consult with cardiology.  I spoken with Dr. Olevia Bowens regarding admission.   Dorie Rank, MD 09/15/22 [phone]  Case reviewed with cardiology service.  They will come see him in consultation.    Dorie Rank, MD 09/15/22 1005

## 2022-09-15 NOTE — ED Notes (Signed)
Assumed care of pt.  Bed is now ready.  RN found sitting on a stool leaning over the bed.  Pt reports comfort.  Denies any distress at this time.

## 2022-09-15 NOTE — Consult Note (Addendum)
Cardiology Consultation   Patient ID: Daniel Ball MRN: [id number]; DOB: 05/12/1956  Admit date: 09/15/2022 Date of Consult: 09/15/2022  PCP:  Curt Bears, [facility] Cardiologist:  [name], MD        Patient Profile:   Daniel Ball is a 66 y.o. male with a hx of CAD s/p atherectomy/DES to LAD 12/2019, mild LV dysfunction by cath 10/2019 with normal LVEF on echo soon after, paroxysmal atrial fibrillation, COPD, DM, GERD, OSA on CPAP, HTN, HLD, ongoing tobacco abuse, NSCLC s/p XRT with more recent recurrent lung CA with multifocal thoracic metastatic disease dx 07/2022 (SCC), spinal stenosis who is being seen 09/15/2022 for the evaluation of atrial fibrillation at the request of Dr. Olevia Bowens.  History of Present Illness:   Daniel Ball previously followed with Dr. Wynonia Lawman then Dr. Harrington Challenger and Dr. Radford Pax. He had a remote cath in 10/2019 with at least moderate multivessel CAD but negative DFR for which medical therapy was recommended - EF was 45-50% at that time by cath. Prior to DC he went into afib that was rate controlled so Eliquis was added. Echo 11/17/19 showed EF 60-65%, mild LVH, normal RV, mild aortic sclerosis without stenosis. F/u monitor showed paroxysmal afib with 30% burden, mostly rate controlled. In follow-up he continued to have dyspnea and underwent subsequent DES to LAD in 12/2019. His SOB persisted despite PCI therefore long acting nitrate was discontinued. He saw pulmonology, Dr. Melvyn Novas, for COPD. He was later dx with lung CA in 08/2020 and underwent XRT. In last OP follow-up 12/2021 he was maintaining NSR. More recently he was seen back by oncology due to abnormal repeat scan with new pulm nodules and rib destruction, found to have pathology c/w squamous cell carcinoma/multifocal thoracic metastatic disease. He also had had a recent positive Cologuard (no overt bleeding seen) and was pending OP EGD/colonoscopy though he states he and his wife  decided that with his progressive cancer, they did not want to pursue this. He was started palliative radiation to the ribs and began chemotherapy with carboplatin, Taxol, and Libtayo with Neulasta support. He presented to [facility] this AM with complaints of SOB, loss of appetite, and inability to sleep. He denies any specific myalgias, just difficulty "getting comfortable." His SOB was worse with exertion, not specifically with recumbency. No new edema. He had a brief episode of focal transient CP around 3am for which he took 1 SL NTG with relief. Labs notable for BNP 220, hsTroponin neg x 2, Na 133, glucose 172, Mg 1.6, H/H OK but platelets 72k, Covid/flu negative. CTA negative for large or central PE, cannot exclude smaller subsegmental due to resp motion. He was found to be in rapid atrial fibrillation HR 150s. He received 2.5mg  IV Lopressor at 0654 then 5mg  at 1034 with improvement in HR to low 100s. Covid/flu neg. He cannot tell that he is out of rhythm, just notes the progressive exercise intolerance over the last week, superimposed on his already baseline SOB.    Past Medical History:  Diagnosis Date   Arthritis    Atrial fibrillation ([place]) 11/02/2019   COPD (chronic obstructive pulmonary disease) (HCC)    Coronary artery disease    a. 12/2019: s/p orbital atherectomy and DES placement to proximal/mid LAD.    Diabetes mellitus without complication ([place])    GERD (gastroesophageal reflux disease)    History of radiation therapy 09/11/20, 09/16/21, 09/18/20    Right lung- SBRT Dr. Sondra Come    Hyperlipidemia  Hypertension    hx HBP - MEDS DC'D 10 YRS since BP has been in normal range   Impingement syndrome of shoulder    left   Non-small cell cancer of right lung (HCC)    Shortness of breath    with exertion   Sleep apnea    uses a cpap   Spinal stenosis     Past Surgical History:  Procedure Laterality Date   BRONCHIAL BIOPSY  04/09/2020   Procedure: BRONCHIAL BIOPSIES;  Surgeon: Collene Gobble, MD;  Location: [place];  Service: Pulmonary;;   BRONCHIAL BIOPSY  10/20/2021   Procedure: BRONCHIAL BIOPSIES;  Surgeon: Collene Gobble, MD;  Location: [facility] ENDOSCOPY;  Service: Pulmonary;;   BRONCHIAL BRUSHINGS  04/09/2020   Procedure: BRONCHIAL BRUSHINGS;  Surgeon: Collene Gobble, MD;  Location: [facility] ENDOSCOPY;  Service: Pulmonary;;   BRONCHIAL BRUSHINGS  07/30/2020   Procedure: BRONCHIAL BRUSHINGS;  Surgeon: Collene Gobble, MD;  Location: [facility] ENDOSCOPY;  Service: Pulmonary;;   BRONCHIAL BRUSHINGS  10/20/2021   Procedure: BRONCHIAL BRUSHINGS;  Surgeon: Collene Gobble, MD;  Location: [facility] ENDOSCOPY;  Service: Pulmonary;;   BRONCHIAL NEEDLE ASPIRATION BIOPSY  04/09/2020   Procedure: BRONCHIAL NEEDLE ASPIRATION BIOPSIES;  Surgeon: Collene Gobble, MD;  Location: [facility] ENDOSCOPY;  Service: Pulmonary;;   BRONCHIAL NEEDLE ASPIRATION BIOPSY  07/30/2020   Procedure: BRONCHIAL NEEDLE ASPIRATION BIOPSIES;  Surgeon: Collene Gobble, MD;  Location: [facility] ENDOSCOPY;  Service: Pulmonary;;   BRONCHIAL NEEDLE ASPIRATION BIOPSY  10/20/2021   Procedure: BRONCHIAL NEEDLE ASPIRATION BIOPSIES;  Surgeon: Collene Gobble, MD;  Location: [facility] ENDOSCOPY;  Service: Pulmonary;;   BRONCHIAL WASHINGS  04/09/2020   Procedure: BRONCHIAL WASHINGS;  Surgeon: Collene Gobble, MD;  Location: [place];  Service: Pulmonary;;   BRONCHIAL WASHINGS  07/30/2020   Procedure: BRONCHIAL WASHINGS;  Surgeon: Collene Gobble, MD;  Location: [place];  Service: Pulmonary;;   BRONCHIAL WASHINGS  10/20/2021   Procedure: BRONCHIAL WASHINGS;  Surgeon: Collene Gobble, MD;  Location: [place] ENDOSCOPY;  Service: Pulmonary;;   CORONARY ATHERECTOMY N/A 01/18/2020   Procedure: CORONARY ATHERECTOMY;  Surgeon: Nelva Bush, MD;  Location: [place] CV LAB;  Service: Cardiovascular;  Laterality: N/A;   CORONARY STENT INTERVENTION  01/18/2020   CORONARY STENT INTERVENTION N/A 01/18/2020   Procedure: CORONARY STENT INTERVENTION;  Surgeon: Nelva Bush, MD;  Location: [place] CV LAB;  Service: Cardiovascular;  Laterality: N/A;   EYE SURGERY Left    FIDUCIAL MARKER PLACEMENT  04/09/2020   Procedure: FIDUCIAL MARKER PLACEMENT;  Surgeon: Collene Gobble, MD;  Location: [place];  Service: Pulmonary;;   FIDUCIAL MARKER PLACEMENT  10/20/2021   Procedure: FIDUCIAL MARKER PLACEMENT;  Surgeon: Collene Gobble, MD;  Location: [facility] ENDOSCOPY;  Service: Pulmonary;;   FINE NEEDLE ASPIRATION  04/09/2020   Procedure: FINE NEEDLE ASPIRATION (FNA) LINEAR;  Surgeon: Collene Gobble, MD;  Location: [place] ENDOSCOPY;  Service: Pulmonary;;   HAND SURGERY Right    thumb   INTRAVASCULAR PRESSURE WIRE/FFR STUDY N/A 11/02/2019   Procedure: INTRAVASCULAR PRESSURE WIRE/FFR STUDY;  Surgeon: Nelva Bush, MD;  Location: [place] CV LAB;  Service: Cardiovascular;  Laterality: N/A;   INTRAVASCULAR ULTRASOUND/IVUS N/A 01/18/2020   Procedure: Intravascular Ultrasound/IVUS;  Surgeon: Nelva Bush, MD;  Location: [place] CV LAB;  Service: Cardiovascular;  Laterality: N/A;   LUMBAR LAMINECTOMY/DECOMPRESSION MICRODISCECTOMY N/A 02/07/2014   Procedure: LUMBAR DECOMPRESSION Lumbar one-Lumbar five;  Surgeon: [name], MD;  Location: [facility];  Service: Orthopedics;  Laterality: N/A;   RIGHT/LEFT HEART CATH AND CORONARY ANGIOGRAPHY N/A 11/02/2019   Procedure: RIGHT/LEFT HEART CATH AND CORONARY ANGIOGRAPHY;  Surgeon: Nelva Bush, MD;  Location: [place] CV LAB;  Service: Cardiovascular;  Laterality: N/A;   SHOULDER ARTHROSCOPY WITH SUBACROMIAL DECOMPRESSION Left 04/12/2014   Procedure: LEFT SHOULDER ARTHROSCOPY WITH SUBACROMIAL DECOMPRESSION AND LABRAL DEBRIDEMENT, ROTATOR CUFF DEBRIDEMENT, BICEPS DEBRIDEMENT;  Surgeon: [name], MD;  Location: [facility];  Service: Orthopedics;  Laterality: Left;   VIDEO BRONCHOSCOPY WITH ENDOBRONCHIAL NAVIGATION N/A 04/09/2020   Procedure: VIDEO BRONCHOSCOPY WITH ENDOBRONCHIAL NAVIGATION;  Surgeon: Collene Gobble, MD;  Location: [place] ENDOSCOPY;  Service: Pulmonary;  Laterality: N/A;   VIDEO BRONCHOSCOPY WITH ENDOBRONCHIAL NAVIGATION Right 07/30/2020   Procedure: VIDEO BRONCHOSCOPY WITH ENDOBRONCHIAL NAVIGATION;  Surgeon: Collene Gobble, MD;  Location: [place] ENDOSCOPY;  Service: Pulmonary;  Laterality: Right;   VIDEO BRONCHOSCOPY WITH ENDOBRONCHIAL ULTRASOUND N/A 04/09/2020   Procedure: VIDEO BRONCHOSCOPY WITH ENDOBRONCHIAL ULTRASOUND;  Surgeon: Collene Gobble, MD;  Location: [facility] ENDOSCOPY;  Service: Pulmonary;  Laterality: N/A;   VIDEO BRONCHOSCOPY WITH RADIAL ENDOBRONCHIAL ULTRASOUND  04/09/2020   Procedure: VIDEO BRONCHOSCOPY WITH RADIAL ENDOBRONCHIAL ULTRASOUND;  Surgeon: Collene Gobble, MD;  Location: [facility] ENDOSCOPY;  Service: Pulmonary;;   VIDEO BRONCHOSCOPY WITH RADIAL ENDOBRONCHIAL ULTRASOUND  10/20/2021   Procedure: VIDEO BRONCHOSCOPY WITH RADIAL ENDOBRONCHIAL ULTRASOUND;  Surgeon: Collene Gobble, MD;  Location: [facility] ENDOSCOPY;  Service: Pulmonary;;     Home Medications:  Prior to Admission medications   Medication Sig Start Date End Date Taking? Authorizing Provider  albuterol (VENTOLIN HFA) 108 (90 Base) MCG/ACT inhaler Inhale 2 puffs into the lungs every 6 (six) hours as needed for wheezing or shortness of breath. 11/04/21  Yes Collene Gobble, MD  amLODipine (NORVASC) 5 MG tablet Take 1 tablet (5 mg total) by mouth daily. 12/25/21  Yes Turner, Eber Hong, MD  apixaban (ELIQUIS) 5 MG TABS tablet Take 1 tablet (5 mg total) by mouth 2 (two) times daily. 12/25/21  Yes Turner, Eber Hong, MD  buPROPion (WELLBUTRIN XL) 300 MG 24 hr tablet Take 300 mg by mouth daily.   Yes [provider]  clopidogrel (PLAVIX) 75 MG tablet Take 1 tablet (75 mg total) by mouth daily with breakfast. Okay to restart this medication on 10/21/2021 Patient taking differently: Take 75 mg by mouth daily with breakfast. 12/25/21  Yes Turner, Eber Hong, MD  cyclobenzaprine (FLEXERIL) 10 MG tablet Take 10 mg by mouth 2 (two) times daily as  needed for muscle spasms.   Yes [provider]  furosemide (LASIX) 40 MG tablet TAKE 1 TABLET BY MOUTH ONCE DAILY AS NEEDED FOR  EDEMA  OR  WEIGHT  GAIN Patient taking differently: Take 40 mg by mouth in the morning. 12/25/21  Yes Turner, Eber Hong, MD  isosorbide mononitrate (IMDUR) 60 MG 24 hr tablet Take 1 tablet (60 mg total) by mouth daily. 12/25/21  Yes Turner, Eber Hong, MD  metFORMIN (GLUCOPHAGE-XR) 500 MG 24 hr tablet Take 1,000 mg by mouth 2 (two) times daily with a meal.   Yes [provider]  metoprolol succinate (TOPROL-XL) 25 MG 24 hr tablet Take 1 tablet (25 mg total) by mouth daily. 12/25/21  Yes Turner, Eber Hong, MD  nitroGLYCERIN (NITROSTAT) 0.4 MG SL tablet Place 1 tablet (0.4 mg total) under the tongue every 5 (five) minutes x 3 doses as needed for chest pain. 12/25/21 12/25/22 Yes Turner, Eber Hong, MD  pantoprazole (PROTONIX) 40 MG tablet Take 1 tablet (  40 mg total) by mouth daily. Patient taking differently: Take 40 mg by mouth daily before breakfast. 01/20/20  Yes Kathyrn Drown D, NP  potassium chloride SA (KLOR-CON M) 20 MEQ tablet TAKE 1 TABLET BY MOUTH ONCE DAILY AS NEEDED - TAKE ON THE DAYS YOU TAKE LASIX Patient taking differently: Take 20 mEq by mouth daily. 12/25/21  Yes Sueanne Margarita, MD  PRESCRIPTION MEDICATION See admin instructions. CPAP- Use during any time of rest   Yes [provider]  prochlorperazine (COMPAZINE) 10 MG tablet Take 1 tablet (10 mg total) by mouth every 6 (six) hours as needed. Patient taking differently: Take 10 mg by mouth every 6 (six) hours as needed for nausea or vomiting. 08/26/22  Yes Heilingoetter, Cassandra L, PA-C  rosuvastatin (CRESTOR) 40 MG tablet Take 1 tablet (40 mg total) by mouth daily. 12/25/21  Yes Turner, Eber Hong, MD  TYLENOL 500 MG tablet Take 500-1,000 mg by mouth every 6 (six) hours as needed for mild pain or headache.   Yes [provider]  valsartan (DIOVAN) 160 MG tablet Take 160 mg by mouth daily.   Yes  [provider]  zolpidem (AMBIEN) 5 MG tablet Take 5 mg by mouth at bedtime.   Yes [provider]  umeclidinium-vilanterol (ANORO ELLIPTA) 62.5-25 MCG/ACT AEPB Inhale 1 puff into the lungs daily. Patient not taking: Reported on 08/19/2022 10/22/21   Collene Gobble, MD    Inpatient Medications: Scheduled Meds:  Continuous Infusions:  PRN Meds: acetaminophen **OR** acetaminophen, prochlorperazine  Allergies:   No Known Allergies  Social History:   Social History   Socioeconomic History   Marital status: Married    Spouse name: Not on file   Number of children: Not on file   Years of education: Not on file   Highest education level: Not on file  Occupational History   Occupation: Pipefitter  Tobacco Use   Smoking status: Every Day    Packs/day: 0.50    Years: 54.00    Total pack years: 27.00    Types: Cigarettes   Smokeless tobacco: Never   Tobacco comments:    10 cigarettes a day ARJ 11/04/21  Vaping Use   Vaping Use: Never used  Substance and Sexual Activity   Alcohol use: No   Drug use: No   Sexual activity: Not on file  Other Topics Concern   Not on file  Social History Narrative   Not on file   Social Determinants of Health   Financial Resource Strain: Not on file  Food Insecurity: Not on file  Transportation Needs: Not on file  Physical Activity: Not on file  Stress: Not on file  Social Connections: Not on file  Intimate Partner Violence: Not on file    Family History:    Family History  Family history unknown: Yes     ROS:  Please see the history of present illness.  All other ROS reviewed and negative.     Physical Exam/Data:   Vitals:   09/15/22 0645 09/15/22 0700 09/15/22 0844 09/15/22 0900  BP: (!) 112/95 (!) 107/59 (!) 130/96 121/83  Pulse: 79 (!) 103 (!) 111 (!) 115  Resp: 18 18 20  (!) 27  Temp:   97.9 F (36.6 C)   TempSrc:   Oral   SpO2: 95% 92% 97% 93%   No intake or output data in the 24 hours ending  09/15/22 1120    09/08/2022   11:23 AM 09/02/2022    7:54 AM  08/26/2022    8:01 AM  Last 3 Weights  Weight (lbs) 244 lb 6.4 oz 249 lb 8 oz 249 lb 1.6 oz  Weight (kg) 110.859 kg 113.172 kg 112.991 kg     There is no height or weight on file to calculate BMI.  General: WM in no acute distress. Head: Normocephalic, atraumatic, sclera non-icteric, no xanthomas, nares are without discharge. Neck: Negative for carotid bruits. JVP not elevated. Lungs: Decreased air movement throughout without acute wheezing, rhonchi or rales. Breathing is unlabored. Heart: Irregularly irregular, rate borderline eleevated, S1 S2 without murmurs, rubs, or gallops.  Abdomen: Soft, non-tender, rounded with normoactive bowel sounds. No rebound/guarding. Extremities: No clubbing or cyanosis. No edema. Distal pedal pulses are 2+ and equal bilaterally. Neuro: Alert and oriented X 3. Moves all extremities spontaneously. Psych:  Responds to questions appropriately with a normal affect.   EKG:  The EKG was personally reviewed and demonstrates:  Atrial fibrillation 152bpm, nonspecific STTW changes Telemetry:  Telemetry was personally reviewed and demonstrates:  afib   Relevant CV Studies: 12/2019 Cath Conclusions: Multivessel coronary artery disease, including 80% proximal LAD stenosis previously shown to be hemodynamically significant by DFR.  There is also moderate disease involving the distal LMCA/ostial LAD, mid/distal LAD, and OM1/OM2. Mildly elevated left ventricular filling pressure. Successful orbital atherectomy and IVUS guided PCI to the proximal/mid LAD using a Synergy 3.5 x 28 mm drug-eluting stent (postdilated to 4.1 mm) with 0% residual stenosis and TIMI-3 flow.   Recommendations: Overnight extended recovery. If no evidence of bleeding/vascular complications, anticipate restarting apixaban tomorrow.  Would continue apixaban and clopidogrel for at least 6 months, after which time patient could be  transitioned to apixaban and aspirin 81 mg daily. Aggressive secondary prevention.  Fasting lipid panel to be checked with morning labs to see if rosuvastatin needs to be increased.   Nelva Bush, MD [facility]  Laboratory Data:  High Sensitivity Troponin:   Recent Labs  Lab 09/15/22 0502 09/15/22 0658  TROPONINIHS 14 14     Chemistry Recent Labs  Lab 09/15/22 0502 09/15/22 0658  NA 133*  --   K 4.0  --   CL 98  --   CO2 22  --   GLUCOSE 172*  --   BUN 15  --   CREATININE 1.03  --   CALCIUM 9.0  --   MG  --  1.6*  GFRNONAA >60  --   ANIONGAP 13  --     Recent Labs  Lab 09/15/22 0658  PROT 7.6  ALBUMIN 3.7  AST 38  ALT 43  ALKPHOS 125  BILITOT 0.8   Lipids No results for input(s): "CHOL", "TRIG", "HDL", "LABVLDL", "LDLCALC", "CHOLHDL" in the last 168 hours.  Hematology Recent Labs  Lab 09/15/22 0502  WBC 8.5  RBC 4.45  HGB 13.9  HCT 40.8  MCV 91.7  MCH 31.2  MCHC 34.1  RDW 14.3  PLT 72*   Thyroid No results for input(s): "TSH", "FREET4" in the last 168 hours.  BNP Recent Labs  Lab 09/15/22 0502  BNP 220.9*    DDimer No results for input(s): "DDIMER" in the last 168 hours.   Radiology/Studies:  CT Angio Chest PE W and/or Wo Contrast  Result Date: 09/15/2022 CLINICAL DATA:  66 year old male with history of acute onset of nonlocalized abdominal pain and shortness of breath. History of non-small cell lung cancer status post radiation therapy. * Tracking Code: BO * . EXAM: CT ANGIOGRAPHY CHEST CT ABDOMEN AND PELVIS  WITH CONTRAST TECHNIQUE: Multidetector CT imaging of the chest was performed using the standard protocol during bolus administration of intravenous contrast. Multiplanar CT image reconstructions and MIPs were obtained to evaluate the vascular anatomy. Multidetector CT imaging of the abdomen and pelvis was performed using the standard protocol during bolus administration of intravenous contrast. RADIATION DOSE REDUCTION: This exam was  performed according to the departmental dose-optimization program which includes automated exposure control, adjustment of the mA and/or kV according to patient size and/or use of iterative reconstruction technique. CONTRAST:  18mL OMNIPAQUE IOHEXOL 350 MG/ML SOLN COMPARISON:  PET-CT 08/16/2022.  Chest CT 07/29/2022. FINDINGS: CTA CHEST FINDINGS Cardiovascular: No filling defects within the central, lobar or segmental sized pulmonary artery branch to suggest clinically significant pulmonary embolism. Smaller subsegmental sized emboli can not be entirely excluded secondary to extensive patient respiratory motion. Heart size is normal. There is no significant pericardial fluid, thickening or pericardial calcification. There is aortic atherosclerosis, as well as atherosclerosis of the great vessels of the mediastinum and the coronary arteries, including calcified atherosclerotic plaque in the left main, left anterior descending and right coronary arteries. Thickening and calcification of the aortic valve. Mediastinum/Nodes: No pathologically enlarged mediastinal or hilar lymph nodes. Small hiatal hernia. Fat and fluid associated with the hiatal hernia, similar to the prior study. Esophagus is otherwise unremarkable in appearance. No axillary lymphadenopathy. Lungs/Pleura: Fiducial markers in the right upper lobe with adjacent mass-like area of architectural distortion, similar to prior studies, most compatible with chronic postradiation mass-like fibrosis. Pleural-based soft tissue masses in the posterior aspect of the lower right hemithorax again noted measuring up to 4.8 x 2.1 cm (axial image 45 of series 5), invasive into the posterior aspect of the right seventh rib. A smaller lesion (axial image 61 of series 5) measures 4.3 x 1.6 cm, invasive into the posterior aspect of the right ninth rib. In the base of the right lower lobe there are 2 areas of nodularity on axial image 95 of series 7 measuring 1.8 x 1.1 cm,  and on axial image 97 of series 7) measuring 1.5 x 1.1 cm. An additional pleural-based mass is noted in the medial aspect of the lower right hemithorax abutting the descending thoracic aorta (axial image 72 of series 5) measuring 3.4 x 1.9 cm. Musculoskeletal: Destructive changes in the posterior aspect of the right seventh and ninth ribs are again noted, corresponding to hypermetabolic metastatic lesions on prior PET-CT. No other new osseous lesions are confidently identified on today's examination. Review of the MIP images confirms the above findings. CT ABDOMEN and PELVIS FINDINGS Hepatobiliary: No suspicious cystic or solid hepatic lesions. No intra or extrahepatic biliary ductal dilatation. Gallbladder is normal in appearance. Pancreas: No pancreatic mass. No pancreatic ductal dilatation. No pancreatic or peripancreatic fluid collections or inflammatory changes. Spleen: Unremarkable. Adrenals/Urinary Tract: Bilateral kidneys and adrenal glands are normal in appearance. No hydroureteronephrosis. Urinary bladder is normal in appearance. Stomach/Bowel: The appearance of the stomach is normal. Duodenal diverticulum extending off the superior aspect of the third portion of the duodenal incidentally noted. No surrounding inflammatory changes to indicate an associated diverticulitis at this time. A few scattered colonic diverticuli are also noted, without surrounding inflammatory changes to indicate colonic diverticulitis at this time. Normal appendix. Vascular/Lymphatic: Aortic atherosclerosis, without evidence of aneurysm or dissection in the abdominal or pelvic vasculature. Retroaortic left renal vein (normal anatomical variant) incidentally noted. Multiple prominent lymph nodes in the upper abdomen, most notably a borderline enlarged portacaval lymph node measuring 1.4 cm  in short axis, similar to prior PET-CT. Reproductive: Prostate gland and seminal vesicles are unremarkable in appearance. Other: No significant  volume of ascites.  No pneumoperitoneum. Musculoskeletal: There are no aggressive appearing lytic or blastic lesions noted in the visualized portions of the skeleton. Review of the MIP images confirms the above findings. IMPRESSION: 1. Stable area of postradiation mass-like fibrosis in the right upper lobe at the site of the treated lesion. Multifocal pulmonary and pleural-based metastatic lesions in the right hemithorax, similar to prior PET-CT, as above. 2. No definite pulmonary nodule identified in the left upper lobe to correspond to the area of hypermetabolism on prior PET-CT. 3. Nonspecific borderline enlarged portacaval lymph node, stable. No other potential signs of metastatic disease noted in the abdomen or pelvis. 4. Aortic atherosclerosis, in addition to left main and 2 vessel coronary artery disease. Please note that although the presence of coronary artery calcium documents the presence of coronary artery disease, the severity of this disease and any potential stenosis cannot be assessed on this non-gated CT examination. Assessment for potential risk factor modification, dietary therapy or pharmacologic therapy may be warranted, if clinically indicated. 5. There are calcifications of the aortic valve. Echocardiographic correlation for evaluation of potential valvular dysfunction may be warranted if clinically indicated. 6. Colonic diverticulosis without evidence of acute diverticulitis at this time 7. Additional incidental findings, as above. Electronically Signed   By: Vinnie Langton M.D.   On: 09/15/2022 08:47   CT ABDOMEN PELVIS W CONTRAST  Result Date: 09/15/2022 CLINICAL DATA:  66 year old male with history of acute onset of nonlocalized abdominal pain and shortness of breath. History of non-small cell lung cancer status post radiation therapy. * Tracking Code: BO * . EXAM: CT ANGIOGRAPHY CHEST CT ABDOMEN AND PELVIS WITH CONTRAST TECHNIQUE: Multidetector CT imaging of the chest was performed  using the standard protocol during bolus administration of intravenous contrast. Multiplanar CT image reconstructions and MIPs were obtained to evaluate the vascular anatomy. Multidetector CT imaging of the abdomen and pelvis was performed using the standard protocol during bolus administration of intravenous contrast. RADIATION DOSE REDUCTION: This exam was performed according to the departmental dose-optimization program which includes automated exposure control, adjustment of the mA and/or kV according to patient size and/or use of iterative reconstruction technique. CONTRAST:  131mL OMNIPAQUE IOHEXOL 350 MG/ML SOLN COMPARISON:  PET-CT 08/16/2022.  Chest CT 07/29/2022. FINDINGS: CTA CHEST FINDINGS Cardiovascular: No filling defects within the central, lobar or segmental sized pulmonary artery branch to suggest clinically significant pulmonary embolism. Smaller subsegmental sized emboli can not be entirely excluded secondary to extensive patient respiratory motion. Heart size is normal. There is no significant pericardial fluid, thickening or pericardial calcification. There is aortic atherosclerosis, as well as atherosclerosis of the great vessels of the mediastinum and the coronary arteries, including calcified atherosclerotic plaque in the left main, left anterior descending and right coronary arteries. Thickening and calcification of the aortic valve. Mediastinum/Nodes: No pathologically enlarged mediastinal or hilar lymph nodes. Small hiatal hernia. Fat and fluid associated with the hiatal hernia, similar to the prior study. Esophagus is otherwise unremarkable in appearance. No axillary lymphadenopathy. Lungs/Pleura: Fiducial markers in the right upper lobe with adjacent mass-like area of architectural distortion, similar to prior studies, most compatible with chronic postradiation mass-like fibrosis. Pleural-based soft tissue masses in the posterior aspect of the lower right hemithorax again noted measuring  up to 4.8 x 2.1 cm (axial image 45 of series 5), invasive into the posterior aspect of the right seventh  rib. A smaller lesion (axial image 61 of series 5) measures 4.3 x 1.6 cm, invasive into the posterior aspect of the right ninth rib. In the base of the right lower lobe there are 2 areas of nodularity on axial image 95 of series 7 measuring 1.8 x 1.1 cm, and on axial image 97 of series 7) measuring 1.5 x 1.1 cm. An additional pleural-based mass is noted in the medial aspect of the lower right hemithorax abutting the descending thoracic aorta (axial image 72 of series 5) measuring 3.4 x 1.9 cm. Musculoskeletal: Destructive changes in the posterior aspect of the right seventh and ninth ribs are again noted, corresponding to hypermetabolic metastatic lesions on prior PET-CT. No other new osseous lesions are confidently identified on today's examination. Review of the MIP images confirms the above findings. CT ABDOMEN and PELVIS FINDINGS Hepatobiliary: No suspicious cystic or solid hepatic lesions. No intra or extrahepatic biliary ductal dilatation. Gallbladder is normal in appearance. Pancreas: No pancreatic mass. No pancreatic ductal dilatation. No pancreatic or peripancreatic fluid collections or inflammatory changes. Spleen: Unremarkable. Adrenals/Urinary Tract: Bilateral kidneys and adrenal glands are normal in appearance. No hydroureteronephrosis. Urinary bladder is normal in appearance. Stomach/Bowel: The appearance of the stomach is normal. Duodenal diverticulum extending off the superior aspect of the third portion of the duodenal incidentally noted. No surrounding inflammatory changes to indicate an associated diverticulitis at this time. A few scattered colonic diverticuli are also noted, without surrounding inflammatory changes to indicate colonic diverticulitis at this time. Normal appendix. Vascular/Lymphatic: Aortic atherosclerosis, without evidence of aneurysm or dissection in the abdominal or pelvic  vasculature. Retroaortic left renal vein (normal anatomical variant) incidentally noted. Multiple prominent lymph nodes in the upper abdomen, most notably a borderline enlarged portacaval lymph node measuring 1.4 cm in short axis, similar to prior PET-CT. Reproductive: Prostate gland and seminal vesicles are unremarkable in appearance. Other: No significant volume of ascites.  No pneumoperitoneum. Musculoskeletal: There are no aggressive appearing lytic or blastic lesions noted in the visualized portions of the skeleton. Review of the MIP images confirms the above findings. IMPRESSION: 1. Stable area of postradiation mass-like fibrosis in the right upper lobe at the site of the treated lesion. Multifocal pulmonary and pleural-based metastatic lesions in the right hemithorax, similar to prior PET-CT, as above. 2. No definite pulmonary nodule identified in the left upper lobe to correspond to the area of hypermetabolism on prior PET-CT. 3. Nonspecific borderline enlarged portacaval lymph node, stable. No other potential signs of metastatic disease noted in the abdomen or pelvis. 4. Aortic atherosclerosis, in addition to left main and 2 vessel coronary artery disease. Please note that although the presence of coronary artery calcium documents the presence of coronary artery disease, the severity of this disease and any potential stenosis cannot be assessed on this non-gated CT examination. Assessment for potential risk factor modification, dietary therapy or pharmacologic therapy may be warranted, if clinically indicated. 5. There are calcifications of the aortic valve. Echocardiographic correlation for evaluation of potential valvular dysfunction may be warranted if clinically indicated. 6. Colonic diverticulosis without evidence of acute diverticulitis at this time 7. Additional incidental findings, as above. Electronically Signed   By: Vinnie Langton M.D.   On: 09/15/2022 08:47   DG Chest 2 View  Result Date:  09/15/2022 CLINICAL DATA:  Increasing shortness of breath. History of lung cancer EXAM: CHEST - 2 VIEW COMPARISON:  10/20/2021 FINDINGS: Opacity at the right apex where there are fiducial markers. Diffuse interstitial prominence which is chronic.  Chronic lung disease by recent chest CT. Cardiomegaly and vascular pedicle widening. Hiatal hernia. IMPRESSION: 1. Chronic cardiomegaly and interstitial lung disease. 2. Chronic right apical opacity at site of radiotherapy. 3. No acute finding when compared to priors. Electronically Signed   By: Jorje Guild M.D.   On: 09/15/2022 05:15     Assessment and Plan:   1. Atrial fibrillation with RVR - received IV metoprolol x 2 doses (last 10:34) with improvement in HR to low 100s - HR when in NSR is in the 60s, on metoprolol 25mg  daily PTA - will discuss addition of oral diltiazem versus increased metoprolol dosing with MD - resume Eliquis, will order here so that he does not get off schedule with dosing - prior monitor demonstrated that he had paroxysmal atrial fib, in and out, 30% burden therefore may not make sense to go directly to cardioversion without seeing if he will self-convert - will review with MD - agree with echo - add TSH  2. CAD, HLD - 1 episode of CP overnight in setting of probable RVR, troponins negative - on Plavix with Eliquis PTA, will review antiplatelet plan with MD given thrombocytopenia (75k) - BB plan TBD as above - would recommend to continue Imdur, rosuvastatin when admission orders done  3. Essential HTN - last BP trending low/normal, can hold home agents until we see what direction he will go with HR control  4. Thrombocytopenia, new, ? Chemo related - per medicine team  5. Hypomagnesemia - replete with IV mag now, recheck in AM  6. Heme-positive stool by cologuard - as above will discuss Plavix rx with Dr. Acie Fredrickson, patient decided not to have EGD/colonoscopy given his progressive metastatic CA  Remainder of  medical issues per medicine team   Risk Assessment/Risk Scores:          CHA2DS2-VASc Score = 3   This indicates a 3.2% annual risk of stroke. The patient's score is based upon: CHF History: 0 HTN History: 1 Diabetes History: 0 Stroke History: 0 Vascular Disease History: 1 Age Score: 1 Gender Score: 0         For questions or updates, please contact [place] Please consult [email or web address] for contact info under    Signed, Charlie Pitter, PA-C  09/15/2022 11:20 AM  Attending Note:   The patient was seen and examined.  Agree with assessment and plan as noted above.  Changes made to the above note as needed.  Patient seen and independently examined with Melina Copa, PA .   We discussed all aspects of the encounter. I agree with the assessment and plan as stated above.    Atrial fib with RVR:   rate is well controlled at this point. He cannot tell that he is in atrial fib.   Perhaps a bit more short of breath .  He has lung cancer as well as fairly severe COPD and obesity .  Has not missed any doses of Eliquis - he was several hours late getting his dose this am   Given his COPD will need to watch for worsening wheezing.  If he  has wheezing with the toprol XL, will switch to dilt CD 180 mg a day .   2.   Lung cancer :  getting XRT as OP   3.   COPD - plans per primary   4.  Obesity :        I have spent a total of 40 minutes with patient reviewing hospital  notes , telemetry, EKGs, labs and examining patient as well as establishing an assessment and plan that was discussed with the patient.  > 50% of time was spent in direct patient care.    Thayer Headings, Brooke Bonito., MD, [facility] 09/15/2022, 1:24 PM [id number],  [place] Pager [phone]

## 2022-09-15 NOTE — H&P (Addendum)
History and Physical    Patient: Daniel Ball:[id number] DOB: 15-Sep-1956 DOA: 09/15/2022 DOS: the patient was seen and examined on 09/15/2022 PCP: Curt Bears, MD  Patient coming from: Home  Chief Complaint:  Chief Complaint  Patient presents with   Shortness of Breath        HPI: Daniel Ball is a 66 y.o. male with medical history significant of arthritis, atrial fibrillation, CAD, hypertension, COPD, sleep apnea on CPAP, non-small cell cancer of the right lung, history of radiation therapy, GERD, hyperlipidemia, spinal stenosis, class III obesity who is coming to the emergency department with complaints of dyspnea, wheezing, poor appetite, anxiety, unable to get comfortable on and off for the past weeks and difficulty sleeping for the past 2 days.  He has also been having increased sputum production with his cough.  He has had loose stool which he attributes to metformin use.  He had an episode of emesis several days ago.  This morning, he had precordial chest pressure that was relieved with 1 nitroglycerin. He denied fever, chills, rhinorrhea, sore throat or hemoptysis.  No palpitations, diaphoresis, PND, orthopnea or recent pitting edema of the lower extremities.  No abdominal pain,  constipation, melena or hematochezia.  No flank pain, dysuria, frequency or hematuria.  No polyuria, polydipsia, polyphagia or blurred vision.   ED course: Initial vital signs were temperature 98.1 F, pulse 141, respirations 22, BP 143/83 mmHg O2 sat 98% on room air.  The patient received metoprolol 5 mg IVP x 2.  Lab work: Coronavirus, influenza and RSV PCR was negative.  Troponin x 214 ng/L.  BNP 220.9 pg/mL.  CBC showed a white count of 8.5 with 76% neutrophils, hemoglobin 13.9 g/dL platelets 72.  BMP with a glucose of 172 mg deciliter, the rest of the measurements are normal after calcium correction.  LFTs and lipase were normal.  Imaging: 2 view chest radiograph with chronic cardiomegaly  and interstitial lung disease.  Chronic right apical opacity outside of radiotherapy.  No acute finding when compared to previous imaging.  CTA chest with no pulmonary embolism.  Heart size is normal.  Calcifications of the aortic valve.  Aortic atherosclerosis.  Small hiatal hernia.  Stable area of postradiation masslike fibrosis in the right upper lobe.  Multifocal pulmonary and pleural-based metastatic lesions in the right hemithorax.  Nonspecific borderline enlarged portocaval lymph node, stable.  CT abdomen/pelvis no acute findings.  No suspicious cystic or solid hepatic lesions.  Gallbladder is normal.  No findings in pancreas.  Unremarkable spleen.  Unremarkable adrenal and urinary tract.  There is a duodenal diverticulum extending off the superior aspect of the third portion of the duodenum.  Colon diverticulosis.  Retroaortic left renal vein which is an anatomical variant.  Multiple prominent lymph nodes in the upper abdomen most notably a borderline enlarged portocaval lymph node.  Please see images and full radiology report for further details.   Review of Systems: As mentioned in the history of present illness. All other systems reviewed and are negative. Past Medical History:  Diagnosis Date   Arthritis    Atrial fibrillation ([place]) 11/02/2019   COPD (chronic obstructive pulmonary disease) (HCC)    Coronary artery disease    a. 12/2019: s/p orbital atherectomy and DES placement to proximal/mid LAD.    Diabetes mellitus without complication ([place])    GERD (gastroesophageal reflux disease)    History of radiation therapy 09/11/20, 09/16/21, 09/18/20    Right lung- SBRT Dr. Sondra Come    Hyperlipidemia  Hypertension    hx HBP - MEDS DC'D 10 YRS since BP has been in normal range   Impingement syndrome of shoulder    left   Non-small cell cancer of right lung (HCC)    Shortness of breath    with exertion   Sleep apnea    uses a cpap   Spinal stenosis    Past Surgical History:   Procedure Laterality Date   BRONCHIAL BIOPSY  04/09/2020   Procedure: BRONCHIAL BIOPSIES;  Surgeon: Collene Gobble, MD;  Location: [place];  Service: Pulmonary;;   BRONCHIAL BIOPSY  10/20/2021   Procedure: BRONCHIAL BIOPSIES;  Surgeon: Collene Gobble, MD;  Location: [facility] ENDOSCOPY;  Service: Pulmonary;;   BRONCHIAL BRUSHINGS  04/09/2020   Procedure: BRONCHIAL BRUSHINGS;  Surgeon: Collene Gobble, MD;  Location: [facility] ENDOSCOPY;  Service: Pulmonary;;   BRONCHIAL BRUSHINGS  07/30/2020   Procedure: BRONCHIAL BRUSHINGS;  Surgeon: Collene Gobble, MD;  Location: [facility] ENDOSCOPY;  Service: Pulmonary;;   BRONCHIAL BRUSHINGS  10/20/2021   Procedure: BRONCHIAL BRUSHINGS;  Surgeon: Collene Gobble, MD;  Location: [facility] ENDOSCOPY;  Service: Pulmonary;;   BRONCHIAL NEEDLE ASPIRATION BIOPSY  04/09/2020   Procedure: BRONCHIAL NEEDLE ASPIRATION BIOPSIES;  Surgeon: Collene Gobble, MD;  Location: [facility] ENDOSCOPY;  Service: Pulmonary;;   BRONCHIAL NEEDLE ASPIRATION BIOPSY  07/30/2020   Procedure: BRONCHIAL NEEDLE ASPIRATION BIOPSIES;  Surgeon: Collene Gobble, MD;  Location: [facility] ENDOSCOPY;  Service: Pulmonary;;   BRONCHIAL NEEDLE ASPIRATION BIOPSY  10/20/2021   Procedure: BRONCHIAL NEEDLE ASPIRATION BIOPSIES;  Surgeon: Collene Gobble, MD;  Location: [facility] ENDOSCOPY;  Service: Pulmonary;;   BRONCHIAL WASHINGS  04/09/2020   Procedure: BRONCHIAL WASHINGS;  Surgeon: Collene Gobble, MD;  Location: [place];  Service: Pulmonary;;   BRONCHIAL WASHINGS  07/30/2020   Procedure: BRONCHIAL WASHINGS;  Surgeon: Collene Gobble, MD;  Location: [place];  Service: Pulmonary;;   BRONCHIAL WASHINGS  10/20/2021   Procedure: BRONCHIAL WASHINGS;  Surgeon: Collene Gobble, MD;  Location: [place] ENDOSCOPY;  Service: Pulmonary;;   CORONARY ATHERECTOMY N/A 01/18/2020   Procedure: CORONARY ATHERECTOMY;  Surgeon: Nelva Bush, MD;  Location: [place] CV LAB;  Service: Cardiovascular;  Laterality: N/A;   CORONARY STENT INTERVENTION   01/18/2020   CORONARY STENT INTERVENTION N/A 01/18/2020   Procedure: CORONARY STENT INTERVENTION;  Surgeon: Nelva Bush, MD;  Location: [place] CV LAB;  Service: Cardiovascular;  Laterality: N/A;   EYE SURGERY Left    FIDUCIAL MARKER PLACEMENT  04/09/2020   Procedure: FIDUCIAL MARKER PLACEMENT;  Surgeon: Collene Gobble, MD;  Location: [place];  Service: Pulmonary;;   FIDUCIAL MARKER PLACEMENT  10/20/2021   Procedure: FIDUCIAL MARKER PLACEMENT;  Surgeon: Collene Gobble, MD;  Location: [facility] ENDOSCOPY;  Service: Pulmonary;;   FINE NEEDLE ASPIRATION  04/09/2020   Procedure: FINE NEEDLE ASPIRATION (FNA) LINEAR;  Surgeon: Collene Gobble, MD;  Location: [place] ENDOSCOPY;  Service: Pulmonary;;   HAND SURGERY Right    thumb   INTRAVASCULAR PRESSURE WIRE/FFR STUDY N/A 11/02/2019   Procedure: INTRAVASCULAR PRESSURE WIRE/FFR STUDY;  Surgeon: Nelva Bush, MD;  Location: [place] CV LAB;  Service: Cardiovascular;  Laterality: N/A;   INTRAVASCULAR ULTRASOUND/IVUS N/A 01/18/2020   Procedure: Intravascular Ultrasound/IVUS;  Surgeon: Nelva Bush, MD;  Location: [place] CV LAB;  Service: Cardiovascular;  Laterality: N/A;   LUMBAR LAMINECTOMY/DECOMPRESSION MICRODISCECTOMY N/A 02/07/2014   Procedure: LUMBAR DECOMPRESSION Lumbar one-Lumbar five;  Surgeon: [name], MD;  Location: [facility] ORS;  Service: Orthopedics;  Laterality:  N/A;   RIGHT/LEFT HEART CATH AND CORONARY ANGIOGRAPHY N/A 11/02/2019   Procedure: RIGHT/LEFT HEART CATH AND CORONARY ANGIOGRAPHY;  Surgeon: Nelva Bush, MD;  Location: [place] CV LAB;  Service: Cardiovascular;  Laterality: N/A;   SHOULDER ARTHROSCOPY WITH SUBACROMIAL DECOMPRESSION Left 04/12/2014   Procedure: LEFT SHOULDER ARTHROSCOPY WITH SUBACROMIAL DECOMPRESSION AND LABRAL DEBRIDEMENT, ROTATOR CUFF DEBRIDEMENT, BICEPS DEBRIDEMENT;  Surgeon: [name], MD;  Location: [facility] ORS;  Service: Orthopedics;  Laterality: Left;   VIDEO BRONCHOSCOPY WITH ENDOBRONCHIAL  NAVIGATION N/A 04/09/2020   Procedure: VIDEO BRONCHOSCOPY WITH ENDOBRONCHIAL NAVIGATION;  Surgeon: Collene Gobble, MD;  Location: [place] ENDOSCOPY;  Service: Pulmonary;  Laterality: N/A;   VIDEO BRONCHOSCOPY WITH ENDOBRONCHIAL NAVIGATION Right 07/30/2020   Procedure: VIDEO BRONCHOSCOPY WITH ENDOBRONCHIAL NAVIGATION;  Surgeon: Collene Gobble, MD;  Location: [place] ENDOSCOPY;  Service: Pulmonary;  Laterality: Right;   VIDEO BRONCHOSCOPY WITH ENDOBRONCHIAL ULTRASOUND N/A 04/09/2020   Procedure: VIDEO BRONCHOSCOPY WITH ENDOBRONCHIAL ULTRASOUND;  Surgeon: Collene Gobble, MD;  Location[facility] ENDOSCOPY;  Service: Pulmonary;  Laterality: N/A;   VIDEO BRONCHOSCOPY WITH RADIAL ENDOBRONCHIAL ULTRASOUND  04/09/2020   Procedure: VIDEO BRONCHOSCOPY WITH RADIAL ENDOBRONCHIAL ULTRASOUND;  Surgeon: Collene Gobble, MD;  Location: [facility] ENDOSCOPY;  Service: Pulmonary;;   VIDEO BRONCHOSCOPY WITH RADIAL ENDOBRONCHIAL ULTRASOUND  10/20/2021   Procedure: VIDEO BRONCHOSCOPY WITH RADIAL ENDOBRONCHIAL ULTRASOUND;  Surgeon: Collene Gobble, MD;  Location: [place] ENDOSCOPY;  Service: Pulmonary;;   Social History:  reports that he has been smoking cigarettes. He has a 27.00 pack-year smoking history. He has never used smokeless tobacco. He reports that he does not drink alcohol and does not use drugs.  No Known Allergies  Family History  Family history unknown: Yes    Prior to Admission medications   Medication Sig Start Date End Date Taking? Authorizing Provider  albuterol (VENTOLIN HFA) 108 (90 Base) MCG/ACT inhaler Inhale 2 puffs into the lungs every 6 (six) hours as needed for wheezing or shortness of breath. 11/04/21   Collene Gobble, MD  amLODipine (NORVASC) 5 MG tablet Take 1 tablet (5 mg total) by mouth daily. 12/25/21   Sueanne Margarita, MD  apixaban (ELIQUIS) 5 MG TABS tablet Take 1 tablet (5 mg total) by mouth 2 (two) times daily. 12/25/21   Sueanne Margarita, MD  buPROPion (WELLBUTRIN XL) 300 MG 24 hr tablet Take 300 mg by mouth  daily.    [provider]  clopidogrel (PLAVIX) 75 MG tablet Take 1 tablet (75 mg total) by mouth daily with breakfast. Okay to restart this medication on 10/21/2021 12/25/21   Sueanne Margarita, MD  cyclobenzaprine (FLEXERIL) 10 MG tablet Take 20 mg by mouth daily as needed for muscle spasms.    [provider]  furosemide (LASIX) 40 MG tablet TAKE 1 TABLET BY MOUTH ONCE DAILY AS NEEDED FOR  EDEMA  OR  WEIGHT  GAIN 12/25/21   Sueanne Margarita, MD  isosorbide mononitrate (IMDUR) 60 MG 24 hr tablet Take 1 tablet (60 mg total) by mouth daily. 12/25/21   Sueanne Margarita, MD  metFORMIN (GLUCOPHAGE) 500 MG tablet Take 1,000 mg by mouth 2 (two) times daily with a meal.    [provider]  metoprolol succinate (TOPROL-XL) 25 MG 24 hr tablet Take 1 tablet (25 mg total) by mouth daily. 12/25/21   Sueanne Margarita, MD  nitroGLYCERIN (NITROSTAT) 0.4 MG SL tablet Place 1 tablet (0.4 mg total) under the tongue every 5 (five) minutes x 3 doses as needed for  chest pain. 12/25/21 12/25/22  Sueanne Margarita, MD  pantoprazole (PROTONIX) 40 MG tablet Take 1 tablet (40 mg total) by mouth daily. 01/20/20   Kathyrn Drown D, NP  potassium chloride SA (KLOR-CON M) 20 MEQ tablet TAKE 1 TABLET BY MOUTH ONCE DAILY AS NEEDED - TAKE ON THE DAYS YOU TAKE LASIX 12/25/21   Sueanne Margarita, MD  prochlorperazine (COMPAZINE) 10 MG tablet Take 1 tablet (10 mg total) by mouth every 6 (six) hours as needed. 08/26/22   Heilingoetter, Cassandra L, PA-C  rosuvastatin (CRESTOR) 40 MG tablet Take 1 tablet (40 mg total) by mouth daily. 12/25/21   Turner, Eber Hong, MD  RYBELSUS 3 MG TABS Take 3 mg by mouth every morning. Patient not taking: Reported on 08/19/2022 10/09/21   [provider]  umeclidinium-vilanterol (ANORO ELLIPTA) 62.5-25 MCG/ACT AEPB Inhale 1 puff into the lungs daily. Patient not taking: Reported on 08/19/2022 10/22/21   Collene Gobble, MD  valsartan (DIOVAN) 320 MG tablet Take 320 mg by mouth daily.    [provider]    Physical Exam: Vitals:   09/15/22 0645 09/15/22 0700 09/15/22 0844 09/15/22 0900  BP: (!) 112/95 (!) 107/59 (!) 130/96 121/83  Pulse: 79 (!) 103 (!) 111 (!) 115  Resp: 18 18 20  (!) 27  Temp:   97.9 F (36.6 C)   TempSrc:   Oral   SpO2: 95% 92% 97% 93%   Physical Exam Vitals and nursing note reviewed.  Constitutional:      Appearance: He is obese.  HENT:     Head: Normocephalic.     Nose: No rhinorrhea.     Mouth/Throat:     Mouth: Mucous membranes are moist.  Eyes:     General: No scleral icterus.    Pupils: Pupils are equal, round, and reactive to light.  Neck:     Vascular: No JVD.  Cardiovascular:     Rate and Rhythm: Tachycardia present. Rhythm irregular.  Pulmonary:     Effort: Pulmonary effort is normal.     Breath sounds: Normal breath sounds. No wheezing, rhonchi or rales.  Abdominal:     General: Bowel sounds are normal.     Palpations: Abdomen is soft.     Tenderness: There is no abdominal tenderness.  Musculoskeletal:     Cervical back: Neck supple.     Right lower leg: No edema.     Left lower leg: No edema.  Skin:    General: Skin is warm and dry.  Neurological:     General: No focal deficit present.     Mental Status: He is alert and oriented to person, place, and time.  Psychiatric:        Mood and Affect: Mood normal.        Behavior: Behavior normal.   Data Reviewed:  Results are pending, will review when available.  Echocardiogram today IMPRESSIONS:   1. Left ventricular ejection fraction, by estimation, is 55 to 60%. The  left ventricle has normal function. The left ventricle has no regional  wall motion abnormalities. There is mild left ventricular hypertrophy.  Left ventricular diastolic parameters  are indeterminate.   2. Right ventricular systolic function is normal. The right ventricular  size is normal. Tricuspid regurgitation signal is inadequate for assessing  PA pressure.   3. A small pericardial effusion  is present.   4. The mitral valve was not well visualized. No evidence of mitral valve  regurgitation. No evidence of mitral stenosis.  5. The aortic valve is grossly normal. There is mild thickening of the  aortic valve. Aortic valve regurgitation is not visualized. No aortic  stenosis is present.   EKG: Vent. rate 152 BPM PR interval * ms QRS duration 90 ms QT/QTcB 299/476 ms P-R-T axes * 51 21 Atrial fibrillation ST depression, probably rate related Borderline prolonged QT interval  Assessment and Plan: Principal Problem:   Atrial fibrillation with RVR (HCC) CHA2DS2-VASc Score Observation/telemetry Continue DOAC. Continue beta-blocker twice daily. As needed parenteral metoprolol. If unable to control HR, use diltiazem. Avoid QT prolonging meds as possible. Keep electrolytes optimized. Check echocardiogram. Cardiology consult appreciated.  Active Problems:   Essential hypertension Continue metoprolol succinate 25 mg p.o. daily. Continue valsartan 160 mg p.o. daily or formulary equivalent. Monitor BP, HR, renal function electrolytes.    GERD Continue pantoprazole 40 mg p.o. daily.    Coronary artery disease of native artery  of native heart with stable angina pectoris (HCC) Continue beta-blocker, ARB, statin and DOAC    ILD (interstitial lung disease) (HCC) Supplemental oxygen and bronchodilators as needed.    OSA (obstructive sleep apnea) CPAP at bedtime.    Hypomagnesemia Supplemented. Follow-up magnesium level as needed.    Thrombocytopenia (HCC)  Follow-up platelet count. Hold clopidogrel for now.    Nocturia Up to 8 times nightly. No other GU complaint. Will check PSA level.    Advance Care Planning:   Code Status: Full Code   Consults: Cardiology (Dr. Acie Fredrickson).  Family Communication:   Severity of Illness: The appropriate patient status for this patient is OBSERVATION. Observation status is judged to be reasonable and necessary in order to  provide the required intensity of service to ensure the patient's safety. The patient's presenting symptoms, physical exam findings, and initial radiographic and laboratory data in the context of their medical condition is felt to place them at decreased risk for further clinical deterioration. Furthermore, it is anticipated that the patient will be medically stable for discharge from the hospital within 2 midnights of admission.   Author: Reubin Milan, MD 09/15/2022 10:14 AM  For on call review [email or web address].   This document was prepared using Dragon voice recognition software and may contain some unintended transcription errors.

## 2022-09-15 NOTE — Progress Notes (Signed)
Pt refused CPAP qhs.  Pt states that he wears his CPAP at home but would rather sleep with his nasal cannula qhs while in the hospital.

## 2022-09-15 NOTE — ED Provider Notes (Signed)
[facility] DEPT Provider Note   CSN: [id number] Arrival date & time: 09/15/22  0413     History  Chief Complaint  Patient presents with   Shortness of Breath         Daniel Ball is a 66 y.o. male.  The history is provided by the patient and medical records.  Shortness of Breath Daniel Ball is a 66 y.o. male who presents to the Emergency Department complaining of poor sleep.  He presents to the ED for evaluation of sob, poor appetite and poor sleep.  Can't get comfortable.  He feels like this has been going off and on for 1 week but not sleeping for 2 days.  No associated fever.  He did have a little bit of central chest discomfort and took a nitro and got better in a few minutes.  He also reports cough productive of thick mucus.  No associate abdominal pain.  He did vomit once a few days ago.  He has occasional nausea.  No leg swelling or pain.   Afib on eliquis, stage IV lung cancer - on chemo and radiation. Last chemo almost 21 days ago.  Radiation Friday, scheduled for today     Home Medications Prior to Admission medications   Medication Sig Start Date End Date Taking? Authorizing Provider  albuterol (VENTOLIN HFA) 108 (90 Base) MCG/ACT inhaler Inhale 2 puffs into the lungs every 6 (six) hours as needed for wheezing or shortness of breath. 11/04/21   Collene Gobble, MD  amLODipine (NORVASC) 5 MG tablet Take 1 tablet (5 mg total) by mouth daily. 12/25/21   Sueanne Margarita, MD  apixaban (ELIQUIS) 5 MG TABS tablet Take 1 tablet (5 mg total) by mouth 2 (two) times daily. 12/25/21   Sueanne Margarita, MD  buPROPion (WELLBUTRIN XL) 300 MG 24 hr tablet Take 300 mg by mouth daily.    [provider]  clopidogrel (PLAVIX) 75 MG tablet Take 1 tablet (75 mg total) by mouth daily with breakfast. Okay to restart this medication on 10/21/2021 12/25/21   Sueanne Margarita, MD  cyclobenzaprine (FLEXERIL) 10 MG tablet Take 20 mg by mouth daily as needed  for muscle spasms.    [provider]  furosemide (LASIX) 40 MG tablet TAKE 1 TABLET BY MOUTH ONCE DAILY AS NEEDED FOR  EDEMA  OR  WEIGHT  GAIN 12/25/21   Sueanne Margarita, MD  isosorbide mononitrate (IMDUR) 60 MG 24 hr tablet Take 1 tablet (60 mg total) by mouth daily. 12/25/21   Sueanne Margarita, MD  metFORMIN (GLUCOPHAGE) 500 MG tablet Take 1,000 mg by mouth 2 (two) times daily with a meal.    [provider]  metoprolol succinate (TOPROL-XL) 25 MG 24 hr tablet Take 1 tablet (25 mg total) by mouth daily. 12/25/21   Sueanne Margarita, MD  nitroGLYCERIN (NITROSTAT) 0.4 MG SL tablet Place 1 tablet (0.4 mg total) under the tongue every 5 (five) minutes x 3 doses as needed for chest pain. 12/25/21 12/25/22  Sueanne Margarita, MD  pantoprazole (PROTONIX) 40 MG tablet Take 1 tablet (40 mg total) by mouth daily. 01/20/20   Kathyrn Drown D, NP  potassium chloride SA (KLOR-CON M) 20 MEQ tablet TAKE 1 TABLET BY MOUTH ONCE DAILY AS NEEDED - TAKE ON THE DAYS YOU TAKE LASIX 12/25/21   Sueanne Margarita, MD  prochlorperazine (COMPAZINE) 10 MG tablet Take 1 tablet (10 mg total) by mouth every 6 (six)  hours as needed. 08/26/22   Heilingoetter, Cassandra L, PA-C  rosuvastatin (CRESTOR) 40 MG tablet Take 1 tablet (40 mg total) by mouth daily. 12/25/21   Turner, Eber Hong, MD  RYBELSUS 3 MG TABS Take 3 mg by mouth every morning. Patient not taking: Reported on 08/19/2022 10/09/21   [provider]  umeclidinium-vilanterol (ANORO ELLIPTA) 62.5-25 MCG/ACT AEPB Inhale 1 puff into the lungs daily. Patient not taking: Reported on 08/19/2022 10/22/21   Collene Gobble, MD  valsartan (DIOVAN) 320 MG tablet Take 320 mg by mouth daily.    [provider]      Allergies    Patient has no known allergies.    Review of Systems   Review of Systems  Respiratory:  Positive for shortness of breath.   All other systems reviewed and are negative.   Physical Exam Updated Vital Signs BP 124/85   Pulse 62   Temp 98.1  F (36.7 C) (Oral)   Resp (!) 25   SpO2 93%  Physical Exam Vitals and nursing note reviewed.  Constitutional:      Appearance: He is well-developed.     Comments: Appears uncomfortable  HENT:     Head: Normocephalic and atraumatic.  Cardiovascular:     Rate and Rhythm: Tachycardia present. Rhythm irregular.     Heart sounds: No murmur heard. Pulmonary:     Effort: Pulmonary effort is normal. No respiratory distress.     Breath sounds: Normal breath sounds.  Abdominal:     Palpations: Abdomen is soft.     Tenderness: There is no guarding or rebound.     Comments: Mild epigastric tenderness  Musculoskeletal:        General: No swelling or tenderness.  Skin:    General: Skin is warm and dry.  Neurological:     Mental Status: He is alert and oriented to person, place, and time.  Psychiatric:        Behavior: Behavior normal.     ED Results / Procedures / Treatments   Labs (all labs ordered are listed, but only abnormal results are displayed) Labs Reviewed  CBC WITH DIFFERENTIAL/PLATELET - Abnormal; Notable for the following components:      Result Value   Platelets 72 (*)    Lymphs Abs 0.4 (*)    Abs Immature Granulocytes 0.75 (*)    All other components within normal limits  BASIC METABOLIC PANEL - Abnormal; Notable for the following components:   Sodium 133 (*)    Glucose, Bld 172 (*)    All other components within normal limits  RESP PANEL BY RT-PCR (RSV, FLU A&B, COVID)  [id number]  BRAIN NATRIURETIC PEPTIDE  TROPONIN I (HIGH SENSITIVITY)  TROPONIN I (HIGH SENSITIVITY)    EKG EKG Interpretation  Date/Time:  Tuesday September 15 2022 05:05:29 EST Ventricular Rate:  152 PR Interval:    QRS Duration: 90 QT Interval:  299 QTC Calculation: 476 R Axis:   51 Text Interpretation: Atrial fibrillation ST depression, probably rate related Borderline prolonged QT interval Confirmed by Quintella Reichert [phone]) on 09/15/2022 5:07:48 AM  Radiology DG Chest 2 View  Result  Date: 09/15/2022 CLINICAL DATA:  Increasing shortness of breath. History of lung cancer EXAM: CHEST - 2 VIEW COMPARISON:  10/20/2021 FINDINGS: Opacity at the right apex where there are fiducial markers. Diffuse interstitial prominence which is chronic. Chronic lung disease by recent chest CT. Cardiomegaly and vascular pedicle widening. Hiatal hernia. IMPRESSION: 1. Chronic cardiomegaly and interstitial lung disease. 2. Chronic  right apical opacity at site of radiotherapy. 3. No acute finding when compared to priors. Electronically Signed   By: Jorje Guild M.D.   On: 09/15/2022 05:15    Procedures Procedures    Medications Ordered in ED Medications - No data to display  ED Course/ Medical Decision Making/ A&P                           Medical Decision Making Amount and/or Complexity of Data Reviewed Labs: ordered. Radiology: ordered.  Risk Prescription drug management.   Pt with hx/o COPD, stage IV lung cancer, afib on eliquis here with sob, feeling restless and poor sleep.  Pt uncomfortable appearing on exam but well perfused.  He is in atrial fibrillation, heart rate between 100-120 without respiratory distress.  He does have some epigastric tenderness without peritoneal findings.  After dose of metoprolol HR improved in 90-100.  Care transferred pending imaging and additional labs.         Final Clinical Impression(s) / ED Diagnoses Final diagnoses:  None    Rx / DC Orders ED Discharge Orders     None         Quintella Reichert, MD 09/15/22 [phone]

## 2022-09-15 NOTE — ED Triage Notes (Signed)
Patient arrived with complaints of shortness of breath, loss of appetite and unable to sleep. States he is on his 8th treatment of radiation. Hx of lung cancer.

## 2022-09-15 NOTE — ED Provider Triage Note (Signed)
  Emergency Medicine Provider Triage Evaluation Note  MRN:  [id number]  Arrival date & time: 09/15/22    Medically screening exam initiated at 4:37 AM.   CC:   Shortness of Breath (/)   HPI:  Daniel Ball is a 66 y.o. year-old male presents to the ED with chief complaint of SOB.  Hx of lung cancer on chemo and radiation.  Reports worsening cough and SOB.  Denies fevers.  Denies recent illness.  On Eliquis - hx of afib.  History provided by patient. ROS:  -As included in HPI PE:   Vitals:   09/15/22 0433  BP: (!) 143/83  Pulse: 80  Resp: (!) 22  Temp: 98.1 F (36.7 C)  SpO2: 98%    Non-toxic appearing No respiratory distress  MDM:  Based on signs and symptoms, COPD exacerbation is highest on my differential, followed by PE, CAP, flu. I've ordered labs and imaging in triage to expedite lab/diagnostic workup.  Patient was informed that the remainder of the evaluation will be completed by another provider, this initial triage assessment does not replace that evaluation, and the importance of remaining in the ED until their evaluation is complete.    Montine Circle, PA-C 09/15/22 [phone]

## 2022-09-15 NOTE — Progress Notes (Signed)
Confirmed with Dr. Acie Fredrickson to continue home dose of metoprolol for now. If HR begins to climb, can start diltiazem but for now since HR were improved with IV metoprolol earlier, we'll hold course with Toprol.

## 2022-09-16 ENCOUNTER — Other Ambulatory Visit: Payer: Self-pay

## 2022-09-16 ENCOUNTER — Telehealth: Payer: Self-pay

## 2022-09-16 ENCOUNTER — Ambulatory Visit
Admission: RE | Admit: 2022-09-16 | Discharge: 2022-09-16 | Disposition: A | Discharge status: Home / Self Care | Payer: Medicare Other | Source: Ambulatory Visit | Attending: Radiation Oncology | Admitting: Radiation Oncology

## 2022-09-16 DIAGNOSIS — I480 Paroxysmal atrial fibrillation: Secondary | ICD-10-CM | POA: Diagnosis not present

## 2022-09-16 DIAGNOSIS — Z510 Encounter for antineoplastic radiation therapy: Secondary | ICD-10-CM | POA: Diagnosis not present

## 2022-09-16 DIAGNOSIS — F17210 Nicotine dependence, cigarettes, uncomplicated: Secondary | ICD-10-CM | POA: Diagnosis not present

## 2022-09-16 DIAGNOSIS — C3411 Malignant neoplasm of upper lobe, right bronchus or lung: Secondary | ICD-10-CM | POA: Diagnosis not present

## 2022-09-16 DIAGNOSIS — I4891 Unspecified atrial fibrillation: Secondary | ICD-10-CM | POA: Diagnosis not present

## 2022-09-16 LAB — RAD ONC ARIA SESSION SUMMARY
Course Elapsed Days: 14
Plan Fractions Treated to Date: 9
Plan Prescribed Dose Per Fraction: 2.5 Gy
Plan Total Fractions Prescribed: 15
Plan Total Prescribed Dose: 37.5 Gy
Reference Point Dosage Given to Date: 22.5 Gy
Reference Point Session Dosage Given: 2.5 Gy
Session Number: 9

## 2022-09-16 LAB — BASIC METABOLIC PANEL
Anion gap: 10 (ref 5–15)
BUN: 23 mg/dL (ref 8–23)
CO2: 26 mmol/L (ref 22–32)
Calcium: 8.8 mg/dL — ABNORMAL LOW (ref 8.9–10.3)
Chloride: 99 mmol/L (ref 98–111)
Creatinine, Ser: 1.05 mg/dL (ref 0.61–1.24)
GFR, Estimated: >60 mL/min (ref >60)
Glucose, Bld: 159 mg/dL — ABNORMAL HIGH (ref 70–99)
Potassium: 4.1 mmol/L (ref 3.5–5.1)
Sodium: 135 mmol/L (ref 135–145)

## 2022-09-16 LAB — GLUCOSE, CAPILLARY
Glucose-Capillary: 178 mg/dL — ABNORMAL HIGH (ref 70–99)
Glucose-Capillary: 175 mg/dL — ABNORMAL HIGH (ref 70–99)

## 2022-09-16 LAB — CBC
HCT: 38 % — ABNORMAL LOW (ref 39.0–52.0)
Hemoglobin: 12.7 g/dL — ABNORMAL LOW (ref 13.0–17.0)
MCH: 30.9 pg (ref 26.0–34.0)
MCHC: 33.4 g/dL (ref 30.0–36.0)
MCV: 92.5 fL (ref 80.0–100.0)
Platelets: 66 10*3/uL — ABNORMAL LOW (ref 150–400)
RBC: 4.11 MIL/uL — ABNORMAL LOW (ref 4.22–5.81)
RDW: 14.3 % (ref 11.5–15.5)
WBC: 9.1 10*3/uL (ref 4.0–10.5)
nRBC: 0 % (ref 0.0–0.2)

## 2022-09-16 LAB — MAGNESIUM: Magnesium: 2.4 mg/dL (ref 1.7–2.4)

## 2022-09-16 LAB — HEMOGLOBIN A1C
Hgb A1c MFr Bld: 7.1 % — ABNORMAL HIGH (ref 4.8–5.6)
Mean Plasma Glucose: 157 mg/dL

## 2022-09-16 LAB — HIV ANTIBODY (ROUTINE TESTING W REFLEX): HIV Screen 4th Generation wRfx: NONREACTIVE

## 2022-09-16 LAB — PSA: Prostatic Specific Antigen: 2.24 ng/mL (ref 0.00–4.00)

## 2022-09-16 MED ORDER — DILTIAZEM HCL ER COATED BEADS 180 MG PO CP24: 180.0000 mg | ORAL_CAPSULE | Freq: Every day | ORAL | 1 refills | Status: AC

## 2022-09-16 MED ORDER — DILTIAZEM HCL ER COATED BEADS 180 MG PO CP24
180.0000 mg | ORAL_CAPSULE | Freq: Every day | ORAL | Status: DC
  Administered 2022-09-16: 180 mg via ORAL
  Filled 2022-09-16: qty 1

## 2022-09-16 NOTE — Telephone Encounter (Signed)
This nurse received a call from this patients wife.  She states that patient has been discharged from the hospital today and she spoke with the hospitalist about him having oxygen at home since he has been on it his entire stay in the hospital.  She states that she was told that the decision has to be made by his medical provider. She would like to know should she go see Pulmonology or his Primary Care Provider to get him an order for oxygen at home.  This nurse forwarded to provider for recommendation.

## 2022-09-16 NOTE — Progress Notes (Addendum)
Progress Note  Patient Name: Daniel Ball Date of Encounter: 09/16/2022  Primary Cardiologist: [name], MD  Subjective   Still SOB with exertion. No CP. No awareness of afib. He is hopeful for discharge today though HR still variable.  Inpatient Medications    Scheduled Meds:  apixaban  5 mg Oral BID   buPROPion  300 mg Oral Daily   furosemide  40 mg Oral Daily   influenza vaccine adjuvanted  0.5 mL Intramuscular Tomorrow-1000   irbesartan  150 mg Oral Daily   isosorbide mononitrate  60 mg Oral Daily   [START ON 09/17/2022] metFORMIN  1,000 mg Oral BID WC   metoprolol succinate  25 mg Oral Daily   pantoprazole  40 mg Oral Daily   potassium chloride SA  20 mEq Oral Daily   rosuvastatin  40 mg Oral Daily   Continuous Infusions:  PRN Meds: acetaminophen **OR** acetaminophen, alum & mag hydroxide-simeth, metoprolol tartrate, nitroGLYCERIN, prochlorperazine   Vital Signs    Vitals:   09/15/22 2334 09/15/22 2341 09/16/22 0359 09/16/22 0734  BP: 120/70  (!) 122/53 117/79  Pulse: 100  100 95  Resp: 19  17 20   Temp: 98.1 F (36.7 C)  97.6 F (36.4 C) 98 F (36.7 C)  TempSrc: Oral  Oral Oral  SpO2: 99%  99% 99%  Weight:  106.1 kg    Height:  5\' 4"  (1.626 m)     No intake or output data in the 24 hours ending 09/16/22 1014    09/15/2022   11:41 PM 09/08/2022   11:23 AM 09/02/2022    7:54 AM  Last 3 Weights  Weight (lbs) 234 lb 244 lb 6.4 oz 249 lb 8 oz  Weight (kg) 106.142 kg 110.859 kg 113.172 kg     Telemetry    Persistent AF rates 80s-130s - Personally Reviewed  ECG    No new tracings - Personally Reviewed  Physical Exam   GEN: No acute distress.  HEENT: Normocephalic, atraumatic, sclera non-icteric. Neck: No JVD or bruits. Cardiac: Irregularly irregular, no murmurs, rubs, or gallops.  Respiratory: Decreased air movement throughout without wheezing, rhonchi or rales. Breathing is unlabored. GI: Soft, nontender, non-distended, BS +x 4. MS: no  deformity. Extremities: No clubbing or cyanosis. No edema. Distal pedal pulses are 2+ and equal bilaterally. Neuro:  AAOx3. Follows commands. Psych:  Responds to questions appropriately with a normal affect.  Labs    High Sensitivity Troponin:   Recent Labs  Lab 09/15/22 0502 09/15/22 0658  TROPONINIHS 14 14      Cardiac EnzymesNo results for input(s): "TROPONINI" in the last 168 hours. No results for input(s): "TROPIPOC" in the last 168 hours.   Chemistry Recent Labs  Lab 09/15/22 0502 09/15/22 0658 09/16/22 0517  NA 133*  --  135  K 4.0  --  4.1  CL 98  --  99  CO2 22  --  26  GLUCOSE 172*  --  159*  BUN 15  --  23  CREATININE 1.03  --  1.05  CALCIUM 9.0  --  8.8*  PROT  --  7.6  --   ALBUMIN  --  3.7  --   AST  --  38  --   ALT  --  43  --   ALKPHOS  --  125  --   BILITOT  --  0.8  --   GFRNONAA >60  --  >60  ANIONGAP 13  --  10  Hematology Recent Labs  Lab 09/15/22 0502 09/16/22 0517  WBC 8.5 9.1  RBC 4.45 4.11*  HGB 13.9 12.7*  HCT 40.8 38.0*  MCV 91.7 92.5  MCH 31.2 30.9  MCHC 34.1 33.4  RDW 14.3 14.3  PLT 72* 66*    BNP Recent Labs  Lab 09/15/22 0502  BNP 220.9*     DDimer No results for input(s): "DDIMER" in the last 168 hours.   Radiology    ECHOCARDIOGRAM COMPLETE  Result Date: 09/15/2022    ECHOCARDIOGRAM REPORT   Patient Name:   MONTELL LEOPARD Date of Exam: 09/15/2022 Medical Rec #:  [id number]        Height:       64.0 in Accession #:    [id number]       Weight:       244.4 lb Date of Birth:  June 21, 1956        BSA:          2.130 m Patient Age:    30 years         BP:           124/63 mmHg Patient Gender: M                HR:           113 bpm. Exam Location:  Inpatient Procedure: 2D Echo, Cardiac Doppler, Color Doppler and Intracardiac            Opacification Agent Indications:    Atrial Fibrillation I48.91  History:        Patient has prior history of Echocardiogram examinations, most                 recent 11/17/2019. CAD,  COPD, Signs/Symptoms:Shortness of Breath;                 Risk Factors:Hypertension and Dyslipidemia. Lung cancer.  Sonographer:    Darlina Sicilian RDCS Referring Phys: [id number] DAVID MANUEL ORTIZ  Sonographer Comments: Suboptimal parasternal window, suboptimal apical window, suboptimal subcostal window and Technically challenging study due to limited acoustic windows. IMPRESSIONS  1. Left ventricular ejection fraction, by estimation, is 55 to 60%. The left ventricle has normal function. The left ventricle has no regional wall motion abnormalities. There is mild left ventricular hypertrophy. Left ventricular diastolic parameters are indeterminate.  2. Right ventricular systolic function is normal. The right ventricular size is normal. Tricuspid regurgitation signal is inadequate for assessing PA pressure.  3. A small pericardial effusion is present.  4. The mitral valve was not well visualized. No evidence of mitral valve regurgitation. No evidence of mitral stenosis.  5. The aortic valve is grossly normal. There is mild thickening of the aortic valve. Aortic valve regurgitation is not visualized. No aortic stenosis is present. FINDINGS  Left Ventricle: Left ventricular ejection fraction, by estimation, is 55 to 60%. The left ventricle has normal function. The left ventricle has no regional wall motion abnormalities. Definity contrast agent was given IV to delineate the left ventricular  endocardial borders. The left ventricular internal cavity size was normal in size. There is mild left ventricular hypertrophy. Left ventricular diastolic parameters are indeterminate. Right Ventricle: The right ventricular size is normal. Right vetricular wall thickness was not well visualized. Right ventricular systolic function is normal. Tricuspid regurgitation signal is inadequate for assessing PA pressure. Left Atrium: Left atrial size was normal in size. Right Atrium: Right atrial size was normal in size. Pericardium: A small  pericardial effusion is present. Mitral  Valve: The mitral valve was not well visualized. No evidence of mitral valve regurgitation. No evidence of mitral valve stenosis. Tricuspid Valve: The tricuspid valve is grossly normal. Tricuspid valve regurgitation is not demonstrated. No evidence of tricuspid stenosis. Aortic Valve: The aortic valve is grossly normal. There is mild thickening of the aortic valve. Aortic valve regurgitation is not visualized. No aortic stenosis is present. Pulmonic Valve: The pulmonic valve was not well visualized. Pulmonic valve regurgitation is trivial. No evidence of pulmonic stenosis. Aorta: The aortic root is normal in size and structure. Ascending aorta measurements are within normal limits for age when indexed to body surface area. Venous: The inferior vena cava was not well visualized. IAS/Shunts: The interatrial septum was not well visualized.  LEFT VENTRICLE PLAX 2D LVIDd:         4.00 cm     Diastology LVIDs:         2.70 cm     LV e' medial:    6.91 cm/s LV PW:         1.10 cm     LV E/e' medial:  10.0 LV IVS:        1.30 cm     LV e' lateral:   9.57 cm/s LVOT diam:     2.20 cm     LV E/e' lateral: 7.2 LV SV:         29 LV SV Index:   14 LVOT Area:     3.80 cm  LV Volumes (MOD) LV vol d, MOD A2C: 55.7 ml LV vol d, MOD A4C: 77.4 ml LV vol s, MOD A2C: 21.5 ml LV vol s, MOD A4C: 37.8 ml LV SV MOD A2C:     34.2 ml LV SV MOD A4C:     77.4 ml LV SV MOD BP:      37.8 ml LEFT ATRIUM             Index LA Vol (A2C):   31.5 ml 14.79 ml/m LA Vol (A4C):   58.2 ml 27.32 ml/m LA Biplane Vol: 43.4 ml 20.37 ml/m  AORTIC VALVE LVOT Vmax:   60.00 cm/s LVOT Vmean:  42.800 cm/s LVOT VTI:    0.077 m  AORTA Ao Root diam: 3.70 cm Ao Asc diam:  3.30 cm MITRAL VALVE MV Area (PHT): 4.53 cm    SHUNTS MV Decel Time: 167 msec    Systemic VTI:  0.08 m MV E velocity: 69.13 cm/s  Systemic Diam: 2.20 cm Cherlynn Kaiser MD Electronically signed by Cherlynn Kaiser MD Signature Date/Time: 09/15/2022/6:10:39 PM     Final    CT Angio Chest PE W and/or Wo Contrast  Result Date: 09/15/2022 CLINICAL DATA:  66 year old male with history of acute onset of nonlocalized abdominal pain and shortness of breath. History of non-small cell lung cancer status post radiation therapy. * Tracking Code: BO * . EXAM: CT ANGIOGRAPHY CHEST CT ABDOMEN AND PELVIS WITH CONTRAST TECHNIQUE: Multidetector CT imaging of the chest was performed using the standard protocol during bolus administration of intravenous contrast. Multiplanar CT image reconstructions and MIPs were obtained to evaluate the vascular anatomy. Multidetector CT imaging of the abdomen and pelvis was performed using the standard protocol during bolus administration of intravenous contrast. RADIATION DOSE REDUCTION: This exam was performed according to the departmental dose-optimization program which includes automated exposure control, adjustment of the mA and/or kV according to patient size and/or use of iterative reconstruction technique. CONTRAST:  177mL OMNIPAQUE IOHEXOL 350 MG/ML SOLN COMPARISON:  PET-CT  08/16/2022.  Chest CT 07/29/2022. FINDINGS: CTA CHEST FINDINGS Cardiovascular: No filling defects within the central, lobar or segmental sized pulmonary artery branch to suggest clinically significant pulmonary embolism. Smaller subsegmental sized emboli can not be entirely excluded secondary to extensive patient respiratory motion. Heart size is normal. There is no significant pericardial fluid, thickening or pericardial calcification. There is aortic atherosclerosis, as well as atherosclerosis of the great vessels of the mediastinum and the coronary arteries, including calcified atherosclerotic plaque in the left main, left anterior descending and right coronary arteries. Thickening and calcification of the aortic valve. Mediastinum/Nodes: No pathologically enlarged mediastinal or hilar lymph nodes. Small hiatal hernia. Fat and fluid associated with the hiatal hernia,  similar to the prior study. Esophagus is otherwise unremarkable in appearance. No axillary lymphadenopathy. Lungs/Pleura: Fiducial markers in the right upper lobe with adjacent mass-like area of architectural distortion, similar to prior studies, most compatible with chronic postradiation mass-like fibrosis. Pleural-based soft tissue masses in the posterior aspect of the lower right hemithorax again noted measuring up to 4.8 x 2.1 cm (axial image 45 of series 5), invasive into the posterior aspect of the right seventh rib. A smaller lesion (axial image 61 of series 5) measures 4.3 x 1.6 cm, invasive into the posterior aspect of the right ninth rib. In the base of the right lower lobe there are 2 areas of nodularity on axial image 95 of series 7 measuring 1.8 x 1.1 cm, and on axial image 97 of series 7) measuring 1.5 x 1.1 cm. An additional pleural-based mass is noted in the medial aspect of the lower right hemithorax abutting the descending thoracic aorta (axial image 72 of series 5) measuring 3.4 x 1.9 cm. Musculoskeletal: Destructive changes in the posterior aspect of the right seventh and ninth ribs are again noted, corresponding to hypermetabolic metastatic lesions on prior PET-CT. No other new osseous lesions are confidently identified on today's examination. Review of the MIP images confirms the above findings. CT ABDOMEN and PELVIS FINDINGS Hepatobiliary: No suspicious cystic or solid hepatic lesions. No intra or extrahepatic biliary ductal dilatation. Gallbladder is normal in appearance. Pancreas: No pancreatic mass. No pancreatic ductal dilatation. No pancreatic or peripancreatic fluid collections or inflammatory changes. Spleen: Unremarkable. Adrenals/Urinary Tract: Bilateral kidneys and adrenal glands are normal in appearance. No hydroureteronephrosis. Urinary bladder is normal in appearance. Stomach/Bowel: The appearance of the stomach is normal. Duodenal diverticulum extending off the superior aspect of  the third portion of the duodenal incidentally noted. No surrounding inflammatory changes to indicate an associated diverticulitis at this time. A few scattered colonic diverticuli are also noted, without surrounding inflammatory changes to indicate colonic diverticulitis at this time. Normal appendix. Vascular/Lymphatic: Aortic atherosclerosis, without evidence of aneurysm or dissection in the abdominal or pelvic vasculature. Retroaortic left renal vein (normal anatomical variant) incidentally noted. Multiple prominent lymph nodes in the upper abdomen, most notably a borderline enlarged portacaval lymph node measuring 1.4 cm in short axis, similar to prior PET-CT. Reproductive: Prostate gland and seminal vesicles are unremarkable in appearance. Other: No significant volume of ascites.  No pneumoperitoneum. Musculoskeletal: There are no aggressive appearing lytic or blastic lesions noted in the visualized portions of the skeleton. Review of the MIP images confirms the above findings. IMPRESSION: 1. Stable area of postradiation mass-like fibrosis in the right upper lobe at the site of the treated lesion. Multifocal pulmonary and pleural-based metastatic lesions in the right hemithorax, similar to prior PET-CT, as above. 2. No definite pulmonary nodule identified in the left upper  lobe to correspond to the area of hypermetabolism on prior PET-CT. 3. Nonspecific borderline enlarged portacaval lymph node, stable. No other potential signs of metastatic disease noted in the abdomen or pelvis. 4. Aortic atherosclerosis, in addition to left main and 2 vessel coronary artery disease. Please note that although the presence of coronary artery calcium documents the presence of coronary artery disease, the severity of this disease and any potential stenosis cannot be assessed on this non-gated CT examination. Assessment for potential risk factor modification, dietary therapy or pharmacologic therapy may be warranted, if  clinically indicated. 5. There are calcifications of the aortic valve. Echocardiographic correlation for evaluation of potential valvular dysfunction may be warranted if clinically indicated. 6. Colonic diverticulosis without evidence of acute diverticulitis at this time 7. Additional incidental findings, as above. Electronically Signed   By: Vinnie Langton M.D.   On: 09/15/2022 08:47   CT ABDOMEN PELVIS W CONTRAST  Result Date: 09/15/2022 CLINICAL DATA:  66 year old male with history of acute onset of nonlocalized abdominal pain and shortness of breath. History of non-small cell lung cancer status post radiation therapy. * Tracking Code: BO * . EXAM: CT ANGIOGRAPHY CHEST CT ABDOMEN AND PELVIS WITH CONTRAST TECHNIQUE: Multidetector CT imaging of the chest was performed using the standard protocol during bolus administration of intravenous contrast. Multiplanar CT image reconstructions and MIPs were obtained to evaluate the vascular anatomy. Multidetector CT imaging of the abdomen and pelvis was performed using the standard protocol during bolus administration of intravenous contrast. RADIATION DOSE REDUCTION: This exam was performed according to the departmental dose-optimization program which includes automated exposure control, adjustment of the mA and/or kV according to patient size and/or use of iterative reconstruction technique. CONTRAST:  12mL OMNIPAQUE IOHEXOL 350 MG/ML SOLN COMPARISON:  PET-CT 08/16/2022.  Chest CT 07/29/2022. FINDINGS: CTA CHEST FINDINGS Cardiovascular: No filling defects within the central, lobar or segmental sized pulmonary artery branch to suggest clinically significant pulmonary embolism. Smaller subsegmental sized emboli can not be entirely excluded secondary to extensive patient respiratory motion. Heart size is normal. There is no significant pericardial fluid, thickening or pericardial calcification. There is aortic atherosclerosis, as well as atherosclerosis of the great  vessels of the mediastinum and the coronary arteries, including calcified atherosclerotic plaque in the left main, left anterior descending and right coronary arteries. Thickening and calcification of the aortic valve. Mediastinum/Nodes: No pathologically enlarged mediastinal or hilar lymph nodes. Small hiatal hernia. Fat and fluid associated with the hiatal hernia, similar to the prior study. Esophagus is otherwise unremarkable in appearance. No axillary lymphadenopathy. Lungs/Pleura: Fiducial markers in the right upper lobe with adjacent mass-like area of architectural distortion, similar to prior studies, most compatible with chronic postradiation mass-like fibrosis. Pleural-based soft tissue masses in the posterior aspect of the lower right hemithorax again noted measuring up to 4.8 x 2.1 cm (axial image 45 of series 5), invasive into the posterior aspect of the right seventh rib. A smaller lesion (axial image 61 of series 5) measures 4.3 x 1.6 cm, invasive into the posterior aspect of the right ninth rib. In the base of the right lower lobe there are 2 areas of nodularity on axial image 95 of series 7 measuring 1.8 x 1.1 cm, and on axial image 97 of series 7) measuring 1.5 x 1.1 cm. An additional pleural-based mass is noted in the medial aspect of the lower right hemithorax abutting the descending thoracic aorta (axial image 72 of series 5) measuring 3.4 x 1.9 cm. Musculoskeletal: Destructive changes in the  posterior aspect of the right seventh and ninth ribs are again noted, corresponding to hypermetabolic metastatic lesions on prior PET-CT. No other new osseous lesions are confidently identified on today's examination. Review of the MIP images confirms the above findings. CT ABDOMEN and PELVIS FINDINGS Hepatobiliary: No suspicious cystic or solid hepatic lesions. No intra or extrahepatic biliary ductal dilatation. Gallbladder is normal in appearance. Pancreas: No pancreatic mass. No pancreatic ductal  dilatation. No pancreatic or peripancreatic fluid collections or inflammatory changes. Spleen: Unremarkable. Adrenals/Urinary Tract: Bilateral kidneys and adrenal glands are normal in appearance. No hydroureteronephrosis. Urinary bladder is normal in appearance. Stomach/Bowel: The appearance of the stomach is normal. Duodenal diverticulum extending off the superior aspect of the third portion of the duodenal incidentally noted. No surrounding inflammatory changes to indicate an associated diverticulitis at this time. A few scattered colonic diverticuli are also noted, without surrounding inflammatory changes to indicate colonic diverticulitis at this time. Normal appendix. Vascular/Lymphatic: Aortic atherosclerosis, without evidence of aneurysm or dissection in the abdominal or pelvic vasculature. Retroaortic left renal vein (normal anatomical variant) incidentally noted. Multiple prominent lymph nodes in the upper abdomen, most notably a borderline enlarged portacaval lymph node measuring 1.4 cm in short axis, similar to prior PET-CT. Reproductive: Prostate gland and seminal vesicles are unremarkable in appearance. Other: No significant volume of ascites.  No pneumoperitoneum. Musculoskeletal: There are no aggressive appearing lytic or blastic lesions noted in the visualized portions of the skeleton. Review of the MIP images confirms the above findings. IMPRESSION: 1. Stable area of postradiation mass-like fibrosis in the right upper lobe at the site of the treated lesion. Multifocal pulmonary and pleural-based metastatic lesions in the right hemithorax, similar to prior PET-CT, as above. 2. No definite pulmonary nodule identified in the left upper lobe to correspond to the area of hypermetabolism on prior PET-CT. 3. Nonspecific borderline enlarged portacaval lymph node, stable. No other potential signs of metastatic disease noted in the abdomen or pelvis. 4. Aortic atherosclerosis, in addition to left main and 2  vessel coronary artery disease. Please note that although the presence of coronary artery calcium documents the presence of coronary artery disease, the severity of this disease and any potential stenosis cannot be assessed on this non-gated CT examination. Assessment for potential risk factor modification, dietary therapy or pharmacologic therapy may be warranted, if clinically indicated. 5. There are calcifications of the aortic valve. Echocardiographic correlation for evaluation of potential valvular dysfunction may be warranted if clinically indicated. 6. Colonic diverticulosis without evidence of acute diverticulitis at this time 7. Additional incidental findings, as above. Electronically Signed   By: Vinnie Langton M.D.   On: 09/15/2022 08:47   DG Chest 2 View  Result Date: 09/15/2022 CLINICAL DATA:  Increasing shortness of breath. History of lung cancer EXAM: CHEST - 2 VIEW COMPARISON:  10/20/2021 FINDINGS: Opacity at the right apex where there are fiducial markers. Diffuse interstitial prominence which is chronic. Chronic lung disease by recent chest CT. Cardiomegaly and vascular pedicle widening. Hiatal hernia. IMPRESSION: 1. Chronic cardiomegaly and interstitial lung disease. 2. Chronic right apical opacity at site of radiotherapy. 3. No acute finding when compared to priors. Electronically Signed   By: Jorje Guild M.D.   On: 09/15/2022 05:15    Cardiac Studies   2d echo 09/15/22   1. Left ventricular ejection fraction, by estimation, is 55 to 60%. The  left ventricle has normal function. The left ventricle has no regional  wall motion abnormalities. There is mild left ventricular hypertrophy.  Left ventricular diastolic parameters  are indeterminate.   2. Right ventricular systolic function is normal. The right ventricular  size is normal. Tricuspid regurgitation signal is inadequate for assessing  PA pressure.   3. A small pericardial effusion is present.   4. The mitral valve  was not well visualized. No evidence of mitral valve  regurgitation. No evidence of mitral stenosis.   5. The aortic valve is grossly normal. There is mild thickening of the  aortic valve. Aortic valve regurgitation is not visualized. No aortic  stenosis is present.    Patient Profile     66 y.o. male with CAD s/p atherectomy/DES to LAD 12/2019, mild LV dysfunction by cath 10/2019 with normal LVEF on echo soon after, paroxysmal atrial fibrillation, COPD, DM, GERD, OSA on CPAP, HTN, HLD, ongoing tobacco abuse, NSCLC s/p XRT with more recent recurrent lung CA with multifocal thoracic metastatic disease dx 07/2022 ([facility]), spinal stenosis who is being seen 09/15/2022 for the evaluation of atrial fibrillation at the request of Dr. Olevia Bowens. Labs also revealed hypomagnesemia and thrombocytopenia.  Assessment & Plan    1. Atrial fibrillation with RVR - received IV metoprolol x 2 doses (last 10:34) with improvement in HR to low 100s - HR trends generally 90s but still spiking to 120s-130s at times (HR when in NSR is in the 60s, on metoprolol 25mg  daily PTA) - continue Eliquis - prior monitor 2021 demonstrated that he had paroxysmal atrial fib, in and out, 30% burden, unclear if this episode is paroxysmal or persistent at this time - TSH wnl - 2D echo EF 55-60%, mild LVH, small pericardial effusion without mention of cardiac tamponade, to be followed clinically - will discuss rate vs rhythm strategy with MD - he does report some chronic wheezing but seems to be doing OK on Toprol - would consider addition of oral diltiazem with OP f/u. There is some reservation regarding DCCV due to prior paroxysmal nature of arrhythmia (in/out 30% on prior monitor) as well as declining platelet count in case anticoag required holding   2. CAD, HLD - 1 episode of CP prior to admission in setting of RVR, troponins negative - on Plavix with Eliquis PTA, will review antiplatelet plan with MD given thrombocytopenia (at time of  cath report 12/2019, interventionalist stated "Would continue apixaban and clopidogrel for at least 6 months, after which time patient could be transitioned to apixaban and aspirin 81 mg daily") - continue BB, Imdur, rosuvastatin  3. Essential HTN - BPs trending low/normal this admission  - home regimen: amlodipine 5mg , Imdur 60mg , Lasix 40mg  daily, Toprol 25mg  daily, valsartan 160mg  daily - here, amlodipine held - last BP 117/79 - will hold off on resuming ARB pending discussion with MD about potential addition of diltiazem   4. Thrombocytopenia, new, ? Chemo related - per medicine team   5. Hypomagnesemia - repleted with IV mag now, recheck normal   6. Heme-positive stool by cologuard - as above will discuss Plavix rx with Dr. Acie Fredrickson, patient decided not to have EGD/colonoscopy given his progressive metastatic CA, Hgb 12.7   Tentatively arranged f/u with me 09/23/21 in [facility] office - we tried to schedule this to coordinate since he is going to be down here in [place] anyway that day for radiation.  For questions or updates, please contact [place] Please consult [email or web address] for contact info under Cardiology/STEMI.  Signed, Charlie Pitter, PA-C 09/16/2022, 10:14 AM     Attending Note:   The patient was  seen and examined.  Agree with assessment and plan as noted above.  Changes made to the above note as needed.  Patient seen and independently examined with Melina Copa, PA .   We discussed all aspects of the encounter. I agree with the assessment and plan as stated above.     Atrial fib:  HR is a bit elevated today .   Tolerating toprol XL 25 mg a day .  Will add Dilt CD 180 mg a day .   Hold valsartan and amlodipine as we add dilt   Continue eliuqis    2.  CAD :  no angina :   would continue plavix unless hem onc would prefer it be discontinued.   3.  Lung cancer :  stage IV .  Follow up with Dr. Inda Merlin       I have spent a total of 40 minutes with patient  reviewing hospital  notes , telemetry, EKGs, labs and examining patient as well as establishing an assessment and plan that was discussed with the patient.  > 50% of time was spent in direct patient care.    Thayer Headings, Brooke Bonito., MD, [facility] 09/16/2022, 11:09 AM [address]  [place] Pager [phone]

## 2022-09-16 NOTE — Progress Notes (Signed)
AVS and discharge instructions reviewed w/ patient. Patient verbalized understanding and had no further questions. Patient instructed to head to cancer center for radiation.

## 2022-09-16 NOTE — Plan of Care (Signed)
  Problem: Education: Goal: Knowledge of disease or condition will improve Outcome: Progressing Goal: Understanding of medication regimen will improve Outcome: Progressing Goal: Individualized Educational Video(s) Outcome: Progressing   Problem: Activity: Goal: Ability to tolerate increased activity will improve Outcome: Progressing   

## 2022-09-16 NOTE — Discharge Summary (Signed)
Physician Discharge Summary  Daniel Ball JJO:[id number] DOB: 1956/07/21 DOA: 09/15/2022  PCP: Daniel Bears, MD  Admit date: 09/15/2022 Discharge date: 09/16/2022  Time spent: 33 minutes  Recommendations for Outpatient Follow-up:  Get CBC as well as Chem-12 + magnesium in less than 1 week-needs to have platelets checked to ensure can still take Plavix and Xarelto appropriately Follow-up in the outpatient setting with Dr. Earlie Ball of oncology and continue treatment with chemo/XRT as per him and radiation oncology New medication Cardizem  Discharge Diagnoses:  MAIN problem for hospitalization   Rapid A-fib HTN Reflux CAD Still smoker with CAD and OSA Thrombocytopenia likely immune mediated improved  Please see below for itemized issues addressed in Daniel Ball- refer to other progress notes for clarity if needed  Discharge Condition: Regular  Diet recommendation: Heart healthy  Filed Weights   09/15/22 2341  Weight: 106.1 kg    History of present illness:  66 year old male home dwelling Known history COPD sleep apnea on CPAP Metastatic stage IV NSCLC on carboplatin Taxol Libtayo X1 cycle status post XRT 2023 (destructive lesion right rib) + recurrence multifocal thoracic mets follows with Dr. Earlie Ball (recent OV 09/08/2022) Reflux HLD CAD with mild LVH with mild LV dysfunction 10/2019 normal echo paroxysmal A-fib A-fib diagnosed around 2021 at hospitalization for cath Present WL 12/26 SOB loss of appetite inability to sleep found to be in rapid A-fib in the 150s-magnesium 1.3 and replaced BNP 220 troponin negative COVID-negative CT chest negative for PE   12/26 cardiology consulted Heme positive stool-known thrombocytopenia worse  Hospital Course:  Patient was admitted to the hospital-patient was found to be in rapid A-fib Cardiology saw the patient, he was a poor candidate for DCCV given low platelets-they added instead Cardizem CD 180 His platelets were found  to be low-this was probably secondary to his cisplatin based chemo-initially Plavix and Xarelto were held-ultimately I discussed with Dr. Julien Ball who recommended resuming them unless he had bleeding or platelets below 50 and he will make sure that this is followed Echo resulted showing EF 55-60% with normal function and indeterminate diastolic parameters Patient was discharged home in stable state  Discharge Exam: Vitals:   09/16/22 0359 09/16/22 0734  BP: (!) 122/53 117/79  Pulse: 100 95  Resp: 17 20  Temp: 97.6 F (36.4 C) 98 F (36.7 C)  SpO2: 99% 99%    Subj on day of d/c   Awake coherent alert no distress  General Exam on discharge  EOMI NCAT no focal deficit no icterus no pallor thick neck Mallampati 4 ROM intact no focal deficit CTAB no added sound Abdomen obese nontender Mild lower extremity edema  Discharge Instructions   Discharge Instructions     Amb referral to [facility]   Complete by: As directed    Diet - low sodium heart healthy   Complete by: As directed    Discharge instructions   Complete by: As directed    You were diagnosed with rapid A-fib and were treated for this New medications were called in for a 1 called Cardizem and you will continue your metoprolol Please make sure that you take your medications as instructed--- I discussed personally with Dr. Earlie Ball of oncology and he feels that as long as we get labs soon and you do not have any bleeding you can continue both Plavix and Xarelto without stoppage Please follow-up in the outpatient setting with all of your specialists and ensure that you continue your meds   Increase activity slowly  Complete by: As directed       Allergies as of 09/16/2022   No Known Allergies      Medication List     STOP taking these medications    amLODipine 5 MG tablet Commonly known as: NORVASC   valsartan 160 MG tablet Commonly known as: DIOVAN   zolpidem 5 MG tablet Commonly known as: AMBIEN        TAKE these medications    albuterol 108 (90 Base) MCG/ACT inhaler Commonly known as: VENTOLIN HFA Inhale 2 puffs into the lungs every 6 (six) hours as needed for wheezing or shortness of breath.   Anoro Ellipta 62.5-25 MCG/ACT Aepb Generic drug: umeclidinium-vilanterol Inhale 1 puff into the lungs daily.   apixaban 5 MG Tabs tablet Commonly known as: Eliquis Take 1 tablet (5 mg total) by mouth 2 (two) times daily.   buPROPion 300 MG 24 hr tablet Commonly known as: WELLBUTRIN XL Take 300 mg by mouth daily.   clopidogrel 75 MG tablet Commonly known as: PLAVIX Take 1 tablet (75 mg total) by mouth daily with breakfast. Okay to restart this medication on 10/21/2021 What changed: additional instructions   cyclobenzaprine 10 MG tablet Commonly known as: FLEXERIL Take 10 mg by mouth 2 (two) times daily as needed for muscle spasms.   diltiazem 180 MG 24 hr capsule Commonly known as: CARDIZEM CD Take 1 capsule (180 mg total) by mouth daily.   furosemide 40 MG tablet Commonly known as: LASIX TAKE 1 TABLET BY MOUTH ONCE DAILY AS NEEDED FOR  EDEMA  OR  WEIGHT  GAIN What changed:  how much to take how to take this when to take this additional instructions   isosorbide mononitrate 60 MG 24 hr tablet Commonly known as: IMDUR Take 1 tablet (60 mg total) by mouth daily.   metFORMIN 500 MG 24 hr tablet Commonly known as: GLUCOPHAGE-XR Take 1,000 mg by mouth 2 (two) times daily with a meal.   metoprolol succinate 25 MG 24 hr tablet Commonly known as: TOPROL-XL Take 1 tablet (25 mg total) by mouth daily.   nitroGLYCERIN 0.4 MG SL tablet Commonly known as: Nitrostat Place 1 tablet (0.4 mg total) under the tongue every 5 (five) minutes x 3 doses as needed for chest pain.   pantoprazole 40 MG tablet Commonly known as: PROTONIX Take 1 tablet (40 mg total) by mouth daily. What changed: when to take this   potassium chloride SA 20 MEQ tablet Commonly known as: KLOR-CON  M TAKE 1 TABLET BY MOUTH ONCE DAILY AS NEEDED - TAKE ON THE DAYS YOU TAKE LASIX What changed:  how much to take how to take this when to take this additional instructions   PRESCRIPTION MEDICATION See admin instructions. CPAP- Use during any time of rest   prochlorperazine 10 MG tablet Commonly known as: COMPAZINE Take 1 tablet (10 mg total) by mouth every 6 (six) hours as needed. What changed: reasons to take this   rosuvastatin 40 MG tablet Commonly known as: CRESTOR Take 1 tablet (40 mg total) by mouth daily.   TYLENOL 500 MG tablet Generic drug: acetaminophen Take 500-1,000 mg by mouth every 6 (six) hours as needed for mild pain or headache.       No Known Allergies  Follow-up Information     Charlie Pitter, PA-C Follow up.   Specialties: Cardiology, Radiology Why: [place] location in [place] - cardiology follow-up has been arranged for you on Wednesday Sep 23, 2022 10:55  AM (Arrive by 10:40 AM). Lisbeth Renshaw is one of the PAs that works with Dr. Harrington Challenger and our team. Contact information: [address] [place] [address] [phone]                  The results of significant diagnostics from this hospitalization (including imaging, microbiology, ancillary and laboratory) are listed below for reference.    Significant Diagnostic Studies: ECHOCARDIOGRAM COMPLETE  Result Date: 09/15/2022    ECHOCARDIOGRAM REPORT   Patient Name:   Daniel Ball Date of Exam: 09/15/2022 Medical Rec #:  [id number]        Height:       64.0 in Accession #:    [id number]       Weight:       244.4 lb Date of Birth:  [birth year]        BSA:          2.130 m Patient Age:    66 years         BP:           124/63 mmHg Patient Gender: M                HR:           113 bpm. Exam Location:  Inpatient Procedure: 2D Echo, Cardiac Doppler, Color Doppler and Intracardiac            Opacification Agent Indications:    Atrial Fibrillation I48.91  History:         Patient has prior history of Echocardiogram examinations, most                 recent 11/17/2019. CAD, COPD, Signs/Symptoms:Shortness of Breath;                 Risk Factors:Hypertension and Dyslipidemia. Lung cancer.  Sonographer:    Darlina Sicilian RDCS Referring Phys: [id number] DAVID MANUEL ORTIZ  Sonographer Comments: Suboptimal parasternal window, suboptimal apical window, suboptimal subcostal window and Technically challenging study due to limited acoustic windows. IMPRESSIONS  1. Left ventricular ejection fraction, by estimation, is 55 to 60%. The left ventricle has normal function. The left ventricle has no regional wall motion abnormalities. There is mild left ventricular hypertrophy. Left ventricular diastolic parameters are indeterminate.  2. Right ventricular systolic function is normal. The right ventricular size is normal. Tricuspid regurgitation signal is inadequate for assessing PA pressure.  3. A small pericardial effusion is present.  4. The mitral valve was not well visualized. No evidence of mitral valve regurgitation. No evidence of mitral stenosis.  5. The aortic valve is grossly normal. There is mild thickening of the aortic valve. Aortic valve regurgitation is not visualized. No aortic stenosis is present. FINDINGS  Left Ventricle: Left ventricular ejection fraction, by estimation, is 55 to 60%. The left ventricle has normal function. The left ventricle has no regional wall motion abnormalities. Definity contrast agent was given IV to delineate the left ventricular  endocardial borders. The left ventricular internal cavity size was normal in size. There is mild left ventricular hypertrophy. Left ventricular diastolic parameters are indeterminate. Right Ventricle: The right ventricular size is normal. Right vetricular wall thickness was not well visualized. Right ventricular systolic function is normal. Tricuspid regurgitation signal is inadequate for assessing PA pressure. Left Atrium: Left  atrial size was normal in size. Right Atrium: Right atrial size was normal in size. Pericardium: A small pericardial effusion is present. Mitral Valve: The mitral valve was not  well visualized. No evidence of mitral valve regurgitation. No evidence of mitral valve stenosis. Tricuspid Valve: The tricuspid valve is grossly normal. Tricuspid valve regurgitation is not demonstrated. No evidence of tricuspid stenosis. Aortic Valve: The aortic valve is grossly normal. There is mild thickening of the aortic valve. Aortic valve regurgitation is not visualized. No aortic stenosis is present. Pulmonic Valve: The pulmonic valve was not well visualized. Pulmonic valve regurgitation is trivial. No evidence of pulmonic stenosis. Aorta: The aortic root is normal in size and structure. Ascending aorta measurements are within normal limits for age when indexed to body surface area. Venous: The inferior vena cava was not well visualized. IAS/Shunts: The interatrial septum was not well visualized.  LEFT VENTRICLE PLAX 2D LVIDd:         4.00 cm     Diastology LVIDs:         2.70 cm     LV e' medial:    6.91 cm/s LV PW:         1.10 cm     LV E/e' medial:  10.0 LV IVS:        1.30 cm     LV e' lateral:   9.57 cm/s LVOT diam:     2.20 cm     LV E/e' lateral: 7.2 LV SV:         29 LV SV Index:   14 LVOT Area:     3.80 cm  LV Volumes (MOD) LV vol d, MOD A2C: 55.7 ml LV vol d, MOD A4C: 77.4 ml LV vol s, MOD A2C: 21.5 ml LV vol s, MOD A4C: 37.8 ml LV SV MOD A2C:     34.2 ml LV SV MOD A4C:     77.4 ml LV SV MOD BP:      37.8 ml LEFT ATRIUM             Index LA Vol (A2C):   31.5 ml 14.79 ml/m LA Vol (A4C):   58.2 ml 27.32 ml/m LA Biplane Vol: 43.4 ml 20.37 ml/m  AORTIC VALVE LVOT Vmax:   60.00 cm/s LVOT Vmean:  42.800 cm/s LVOT VTI:    0.077 m  AORTA Ao Root diam: 3.70 cm Ao Asc diam:  3.30 cm MITRAL VALVE MV Area (PHT): 4.53 cm    SHUNTS MV Decel Time: 167 msec    Systemic VTI:  0.08 m MV E velocity: 69.13 cm/s  Systemic Diam: 2.20 cm  Cherlynn Kaiser MD Electronically signed by Cherlynn Kaiser MD Signature Date/Time: 09/15/2022/6:10:39 PM    Final    CT Angio Chest PE W and/or Wo Contrast  Result Date: 09/15/2022 CLINICAL DATA:  66 year old male with history of acute onset of nonlocalized abdominal pain and shortness of breath. History of non-small cell lung cancer status post radiation therapy. * Tracking Code: BO * . EXAM: CT ANGIOGRAPHY CHEST CT ABDOMEN AND PELVIS WITH CONTRAST TECHNIQUE: Multidetector CT imaging of the chest was performed using the standard protocol during bolus administration of intravenous contrast. Multiplanar CT image reconstructions and MIPs were obtained to evaluate the vascular anatomy. Multidetector CT imaging of the abdomen and pelvis was performed using the standard protocol during bolus administration of intravenous contrast. RADIATION DOSE REDUCTION: This exam was performed according to the departmental dose-optimization program which includes automated exposure control, adjustment of the mA and/or kV according to patient size and/or use of iterative reconstruction technique. CONTRAST:  172mL OMNIPAQUE IOHEXOL 350 MG/ML SOLN COMPARISON:  PET-CT 08/16/2022.  Chest CT 07/29/2022. FINDINGS:  CTA CHEST FINDINGS Cardiovascular: No filling defects within the central, lobar or segmental sized pulmonary artery branch to suggest clinically significant pulmonary embolism. Smaller subsegmental sized emboli can not be entirely excluded secondary to extensive patient respiratory motion. Heart size is normal. There is no significant pericardial fluid, thickening or pericardial calcification. There is aortic atherosclerosis, as well as atherosclerosis of the great vessels of the mediastinum and the coronary arteries, including calcified atherosclerotic plaque in the left main, left anterior descending and right coronary arteries. Thickening and calcification of the aortic valve. Mediastinum/Nodes: No pathologically enlarged  mediastinal or hilar lymph nodes. Small hiatal hernia. Fat and fluid associated with the hiatal hernia, similar to the prior study. Esophagus is otherwise unremarkable in appearance. No axillary lymphadenopathy. Lungs/Pleura: Fiducial markers in the right upper lobe with adjacent mass-like area of architectural distortion, similar to prior studies, most compatible with chronic postradiation mass-like fibrosis. Pleural-based soft tissue masses in the posterior aspect of the lower right hemithorax again noted measuring up to 4.8 x 2.1 cm (axial image 45 of series 5), invasive into the posterior aspect of the right seventh rib. A smaller lesion (axial image 61 of series 5) measures 4.3 x 1.6 cm, invasive into the posterior aspect of the right ninth rib. In the base of the right lower lobe there are 2 areas of nodularity on axial image 95 of series 7 measuring 1.8 x 1.1 cm, and on axial image 97 of series 7) measuring 1.5 x 1.1 cm. An additional pleural-based mass is noted in the medial aspect of the lower right hemithorax abutting the descending thoracic aorta (axial image 72 of series 5) measuring 3.4 x 1.9 cm. Musculoskeletal: Destructive changes in the posterior aspect of the right seventh and ninth ribs are again noted, corresponding to hypermetabolic metastatic lesions on prior PET-CT. No other new osseous lesions are confidently identified on today's examination. Review of the MIP images confirms the above findings. CT ABDOMEN and PELVIS FINDINGS Hepatobiliary: No suspicious cystic or solid hepatic lesions. No intra or extrahepatic biliary ductal dilatation. Gallbladder is normal in appearance. Pancreas: No pancreatic mass. No pancreatic ductal dilatation. No pancreatic or peripancreatic fluid collections or inflammatory changes. Spleen: Unremarkable. Adrenals/Urinary Tract: Bilateral kidneys and adrenal glands are normal in appearance. No hydroureteronephrosis. Urinary bladder is normal in appearance.  Stomach/Bowel: The appearance of the stomach is normal. Duodenal diverticulum extending off the superior aspect of the third portion of the duodenal incidentally noted. No surrounding inflammatory changes to indicate an associated diverticulitis at this time. A few scattered colonic diverticuli are also noted, without surrounding inflammatory changes to indicate colonic diverticulitis at this time. Normal appendix. Vascular/Lymphatic: Aortic atherosclerosis, without evidence of aneurysm or dissection in the abdominal or pelvic vasculature. Retroaortic left renal vein (normal anatomical variant) incidentally noted. Multiple prominent lymph nodes in the upper abdomen, most notably a borderline enlarged portacaval lymph node measuring 1.4 cm in short axis, similar to prior PET-CT. Reproductive: Prostate gland and seminal vesicles are unremarkable in appearance. Other: No significant volume of ascites.  No pneumoperitoneum. Musculoskeletal: There are no aggressive appearing lytic or blastic lesions noted in the visualized portions of the skeleton. Review of the MIP images confirms the above findings. IMPRESSION: 1. Stable area of postradiation mass-like fibrosis in the right upper lobe at the site of the treated lesion. Multifocal pulmonary and pleural-based metastatic lesions in the right hemithorax, similar to prior PET-CT, as above. 2. No definite pulmonary nodule identified in the left upper lobe to correspond to the area  of hypermetabolism on prior PET-CT. 3. Nonspecific borderline enlarged portacaval lymph node, stable. No other potential signs of metastatic disease noted in the abdomen or pelvis. 4. Aortic atherosclerosis, in addition to left main and 2 vessel coronary artery disease. Please note that although the presence of coronary artery calcium documents the presence of coronary artery disease, the severity of this disease and any potential stenosis cannot be assessed on this non-gated CT examination.  Assessment for potential risk factor modification, dietary therapy or pharmacologic therapy may be warranted, if clinically indicated. 5. There are calcifications of the aortic valve. Echocardiographic correlation for evaluation of potential valvular dysfunction may be warranted if clinically indicated. 6. Colonic diverticulosis without evidence of acute diverticulitis at this time 7. Additional incidental findings, as above. Electronically Signed   By: Vinnie Langton M.D.   On: 09/15/2022 08:47   CT ABDOMEN PELVIS W CONTRAST  Result Date: 09/15/2022 CLINICAL DATA:  66 year old male with history of acute onset of nonlocalized abdominal pain and shortness of breath. History of non-small cell lung cancer status post radiation therapy. * Tracking Code: BO * . EXAM: CT ANGIOGRAPHY CHEST CT ABDOMEN AND PELVIS WITH CONTRAST TECHNIQUE: Multidetector CT imaging of the chest was performed using the standard protocol during bolus administration of intravenous contrast. Multiplanar CT image reconstructions and MIPs were obtained to evaluate the vascular anatomy. Multidetector CT imaging of the abdomen and pelvis was performed using the standard protocol during bolus administration of intravenous contrast. RADIATION DOSE REDUCTION: This exam was performed according to the departmental dose-optimization program which includes automated exposure control, adjustment of the mA and/or kV according to patient size and/or use of iterative reconstruction technique. CONTRAST:  173mL OMNIPAQUE IOHEXOL 350 MG/ML SOLN COMPARISON:  PET-CT 08/16/2022.  Chest CT 07/29/2022. FINDINGS: CTA CHEST FINDINGS Cardiovascular: No filling defects within the central, lobar or segmental sized pulmonary artery branch to suggest clinically significant pulmonary embolism. Smaller subsegmental sized emboli can not be entirely excluded secondary to extensive patient respiratory motion. Heart size is normal. There is no significant pericardial fluid,  thickening or pericardial calcification. There is aortic atherosclerosis, as well as atherosclerosis of the great vessels of the mediastinum and the coronary arteries, including calcified atherosclerotic plaque in the left main, left anterior descending and right coronary arteries. Thickening and calcification of the aortic valve. Mediastinum/Nodes: No pathologically enlarged mediastinal or hilar lymph nodes. Small hiatal hernia. Fat and fluid associated with the hiatal hernia, similar to the prior study. Esophagus is otherwise unremarkable in appearance. No axillary lymphadenopathy. Lungs/Pleura: Fiducial markers in the right upper lobe with adjacent mass-like area of architectural distortion, similar to prior studies, most compatible with chronic postradiation mass-like fibrosis. Pleural-based soft tissue masses in the posterior aspect of the lower right hemithorax again noted measuring up to 4.8 x 2.1 cm (axial image 45 of series 5), invasive into the posterior aspect of the right seventh rib. A smaller lesion (axial image 61 of series 5) measures 4.3 x 1.6 cm, invasive into the posterior aspect of the right ninth rib. In the base of the right lower lobe there are 2 areas of nodularity on axial image 95 of series 7 measuring 1.8 x 1.1 cm, and on axial image 97 of series 7) measuring 1.5 x 1.1 cm. An additional pleural-based mass is noted in the medial aspect of the lower right hemithorax abutting the descending thoracic aorta (axial image 72 of series 5) measuring 3.4 x 1.9 cm. Musculoskeletal: Destructive changes in the posterior aspect of the right seventh  and ninth ribs are again noted, corresponding to hypermetabolic metastatic lesions on prior PET-CT. No other new osseous lesions are confidently identified on today's examination. Review of the MIP images confirms the above findings. CT ABDOMEN and PELVIS FINDINGS Hepatobiliary: No suspicious cystic or solid hepatic lesions. No intra or extrahepatic biliary  ductal dilatation. Gallbladder is normal in appearance. Pancreas: No pancreatic mass. No pancreatic ductal dilatation. No pancreatic or peripancreatic fluid collections or inflammatory changes. Spleen: Unremarkable. Adrenals/Urinary Tract: Bilateral kidneys and adrenal glands are normal in appearance. No hydroureteronephrosis. Urinary bladder is normal in appearance. Stomach/Bowel: The appearance of the stomach is normal. Duodenal diverticulum extending off the superior aspect of the third portion of the duodenal incidentally noted. No surrounding inflammatory changes to indicate an associated diverticulitis at this time. A few scattered colonic diverticuli are also noted, without surrounding inflammatory changes to indicate colonic diverticulitis at this time. Normal appendix. Vascular/Lymphatic: Aortic atherosclerosis, without evidence of aneurysm or dissection in the abdominal or pelvic vasculature. Retroaortic left renal vein (normal anatomical variant) incidentally noted. Multiple prominent lymph nodes in the upper abdomen, most notably a borderline enlarged portacaval lymph node measuring 1.4 cm in short axis, similar to prior PET-CT. Reproductive: Prostate gland and seminal vesicles are unremarkable in appearance. Other: No significant volume of ascites.  No pneumoperitoneum. Musculoskeletal: There are no aggressive appearing lytic or blastic lesions noted in the visualized portions of the skeleton. Review of the MIP images confirms the above findings. IMPRESSION: 1. Stable area of postradiation mass-like fibrosis in the right upper lobe at the site of the treated lesion. Multifocal pulmonary and pleural-based metastatic lesions in the right hemithorax, similar to prior PET-CT, as above. 2. No definite pulmonary nodule identified in the left upper lobe to correspond to the area of hypermetabolism on prior PET-CT. 3. Nonspecific borderline enlarged portacaval lymph node, stable. No other potential signs of  metastatic disease noted in the abdomen or pelvis. 4. Aortic atherosclerosis, in addition to left main and 2 vessel coronary artery disease. Please note that although the presence of coronary artery calcium documents the presence of coronary artery disease, the severity of this disease and any potential stenosis cannot be assessed on this non-gated CT examination. Assessment for potential risk factor modification, dietary therapy or pharmacologic therapy may be warranted, if clinically indicated. 5. There are calcifications of the aortic valve. Echocardiographic correlation for evaluation of potential valvular dysfunction may be warranted if clinically indicated. 6. Colonic diverticulosis without evidence of acute diverticulitis at this time 7. Additional incidental findings, as above. Electronically Signed   By: Vinnie Langton M.D.   On: 09/15/2022 08:47   DG Chest 2 View  Result Date: 09/15/2022 CLINICAL DATA:  Increasing shortness of breath. History of lung cancer EXAM: CHEST - 2 VIEW COMPARISON:  10/20/2021 FINDINGS: Opacity at the right apex where there are fiducial markers. Diffuse interstitial prominence which is chronic. Chronic lung disease by recent chest CT. Cardiomegaly and vascular pedicle widening. Hiatal hernia. IMPRESSION: 1. Chronic cardiomegaly and interstitial lung disease. 2. Chronic right apical opacity at site of radiotherapy. 3. No acute finding when compared to priors. Electronically Signed   By: Jorje Guild M.D.   On: 09/15/2022 05:15   MR Brain W Wo Contrast  Result Date: 09/01/2022 CLINICAL DATA:  Lung carcinoma staging EXAM: MRI HEAD WITHOUT AND WITH CONTRAST TECHNIQUE: Multiplanar, multiecho pulse sequences of the brain and surrounding structures were obtained without and with intravenous contrast. CONTRAST:  85mL GADAVIST GADOBUTROL 1 MMOL/ML IV SOLN COMPARISON:  None Available. FINDINGS: Brain: No acute infarct, mass effect or extra-axial collection. No acute or chronic  hemorrhage. There is multifocal hyperintense T2-weighted signal within the white matter. Parenchymal volume and CSF spaces are normal. The midline structures are normal. Vascular: Major flow voids are preserved. Skull and upper cervical spine: Normal calvarium and skull base. Visualized upper cervical spine and soft tissues are normal. Sinuses/Orbits:No paranasal sinus fluid levels or advanced mucosal thickening. No mastoid or middle ear effusion. Normal orbits. IMPRESSION: 1. No intracranial metastatic disease. 2. Findings of chronic small vessel ischemia. Electronically Signed   By: Ulyses Jarred M.D.   On: 09/01/2022 02:49   CT BONE TROCAR/NEEDLE BIOPSY DEEP  Result Date: 08/19/2022 INDICATION: Chest mass EXAM: CT-guided core needle biopsy of right posterior chest wall mass MEDICATIONS: None. ANESTHESIA/SEDATION: Moderate (conscious) sedation was employed during this procedure. A total of Versed 1 mg and Fentanyl 75 mcg was administered intravenously. Moderate Sedation Time: 17 minutes. The patient's level of consciousness and vital signs were monitored continuously by radiology nursing throughout the procedure under my direct supervision. FLUOROSCOPY TIME:  N/a COMPLICATIONS: None immediate. PROCEDURE: Informed written consent was obtained from the patient after a thorough discussion of the procedural risks, benefits and alternatives. All questions were addressed. Maximal Sterile Barrier Technique was utilized including caps, mask, sterile gowns, sterile gloves, sterile drape, hand hygiene and skin antiseptic. A timeout was performed prior to the initiation of the procedure. The patient was placed prone on the exam table. Limited CT of the chest was performed for planning purposes. This demonstrated right posterior chest wall mass abutting the pleura with rib involvement. Skin entry site was marked, and the overlying skin was prepped and draped in the standard sterile fashion. Local analgesia was obtained  with 1% lidocaine. Using intermittent CT fluoroscopy, a 17 gauge introducer needle was advanced towards the identified lesion. Subsequently, core needle biopsy was performed using an 18 gauge core biopsy device x4 total passes. Specimens were submitted in formalin to pathology for further handling. Limited postprocedure imaging demonstrated no complicating feature. The patient tolerated the procedure well, and was transferred to recovery in stable condition. IMPRESSION: Successful CT-guided core needle biopsy of right posterior chest wall mass with pleural and rib involvement. Electronically Signed   By: Albin Felling M.D.   On: 08/19/2022 13:20    Microbiology: Recent Results (from the past 240 hour(s))  Resp panel by RT-PCR (RSV, Flu A&B, Covid) Anterior Nasal Swab     Status: None   Collection Time: 09/15/22  5:10 AM   Specimen: Anterior Nasal Swab  Result Value Ref Range Status   SARS Coronavirus 2 by RT PCR NEGATIVE NEGATIVE Final    Comment: (NOTE) SARS-CoV-2 target nucleic acids are NOT DETECTED.  The SARS-CoV-2 RNA is generally detectable in upper respiratory specimens during the acute phase of infection. The lowest concentration of SARS-CoV-2 viral copies this assay can detect is 138 copies/mL. A negative result does not preclude SARS-Cov-2 infection and should not be used as the sole basis for treatment or other patient management decisions. A negative result may occur with  improper specimen collection/handling, submission of specimen other than nasopharyngeal swab, presence of viral mutation(s) within the areas targeted by this assay, and inadequate number of viral copies(<138 copies/mL). A negative result must be combined with clinical observations, patient history, and epidemiological information. The expected result is Negative.  Fact Sheet for Patients:  EntrepreneurPulse.com.au  Fact Sheet for Healthcare Providers:   IncredibleEmployment.be  This test is no t  yet approved or cleared by the [place] and  has been authorized for detection and/or diagnosis of SARS-CoV-2 by FDA under an Emergency Use Authorization (EUA). This EUA will remain  in effect (meaning this test can be used) for the duration of the COVID-19 declaration under Section 564(b)(1) of the Act, 21 U.S.C.section 360bbb-3(b)(1), unless the authorization is terminated  or revoked sooner.       Influenza A by PCR NEGATIVE NEGATIVE Final   Influenza B by PCR NEGATIVE NEGATIVE Final    Comment: (NOTE) The Xpert Xpress SARS-CoV-2/FLU/RSV plus assay is intended as an aid in the diagnosis of influenza from Nasopharyngeal swab specimens and should not be used as a sole basis for treatment. Nasal washings and aspirates are unacceptable for Xpert Xpress SARS-CoV-2/FLU/RSV testing.  Fact Sheet for Patients: EntrepreneurPulse.com.au  Fact Sheet for Healthcare Providers: IncredibleEmployment.be  This test is not yet approved or cleared by the [place] FDA and has been authorized for detection and/or diagnosis of SARS-CoV-2 by FDA under an Emergency Use Authorization (EUA). This EUA will remain in effect (meaning this test can be used) for the duration of the COVID-19 declaration under Section 564(b)(1) of the Act, 21 U.S.C. section 360bbb-3(b)(1), unless the authorization is terminated or revoked.     Resp Syncytial Virus by PCR NEGATIVE NEGATIVE Final    Comment: (NOTE) Fact Sheet for Patients: EntrepreneurPulse.com.au  Fact Sheet for Healthcare Providers: IncredibleEmployment.be  This test is not yet approved or cleared by the [place] FDA and has been authorized for detection and/or diagnosis of SARS-CoV-2 by FDA under an Emergency Use Authorization (EUA). This EUA will remain in effect (meaning this test can be used) for  the duration of the COVID-19 declaration under Section 564(b)(1) of the Act, 21 U.S.C. section 360bbb-3(b)(1), unless the authorization is terminated or revoked.  Performed at [facility], [place] [address], [place], [place] [address]      Labs: Basic Metabolic Panel: Recent Labs  Lab 09/15/22 0502 09/15/22 0658 09/16/22 0517  NA 133*  --  135  K 4.0  --  4.1  CL 98  --  99  CO2 22  --  26  GLUCOSE 172*  --  159*  BUN 15  --  23  CREATININE 1.03  --  1.05  CALCIUM 9.0  --  8.8*  MG  --  1.6* 2.4  PHOS  --  2.9  --    Liver Function Tests: Recent Labs  Lab 09/15/22 0658  AST 38  ALT 43  ALKPHOS 125  BILITOT 0.8  PROT 7.6  ALBUMIN 3.7   Recent Labs  Lab 09/15/22 0658  LIPASE 24   No results for input(s): "AMMONIA" in the last 168 hours. CBC: Recent Labs  Lab 09/15/22 0502 09/16/22 0517  WBC 8.5 9.1  NEUTROABS 6.4  --   HGB 13.9 12.7*  HCT 40.8 38.0*  MCV 91.7 92.5  PLT 72* 66*   Cardiac Enzymes: No results for input(s): "CKTOTAL", "CKMB", "CKMBINDEX", "TROPONINI" in the last 168 hours. BNP: BNP (last 3 results) Recent Labs    09/15/22 0502  BNP 220.9*    ProBNP (last 3 results) No results for input(s): "PROBNP" in the last 8760 hours.  CBG: Recent Labs  Lab 09/15/22 1336 09/15/22 1723 09/15/22 2009 09/16/22 0729 09/16/22 1139  GLUCAP 156* 168* 155* 178* 175*       Signed:  Nita Sells MD   [facility] 09/16/2022, 12:20 PM

## 2022-09-17 ENCOUNTER — Encounter (HOSPITAL_COMMUNITY): Payer: Self-pay | Admitting: Internal Medicine

## 2022-09-17 ENCOUNTER — Other Ambulatory Visit: Payer: Self-pay

## 2022-09-17 ENCOUNTER — Ambulatory Visit
Admission: RE | Admit: 2022-09-17 | Discharge: 2022-09-17 | Disposition: A | Discharge status: Home / Self Care | Payer: Medicare Other | Source: Ambulatory Visit | Attending: Radiation Oncology | Admitting: Radiation Oncology

## 2022-09-17 ENCOUNTER — Encounter: Payer: Self-pay | Admitting: Emergency Medicine

## 2022-09-17 DIAGNOSIS — C782 Secondary malignant neoplasm of pleura: Secondary | ICD-10-CM | POA: Diagnosis not present

## 2022-09-17 DIAGNOSIS — F17210 Nicotine dependence, cigarettes, uncomplicated: Secondary | ICD-10-CM | POA: Diagnosis not present

## 2022-09-17 DIAGNOSIS — Z5112 Encounter for antineoplastic immunotherapy: Secondary | ICD-10-CM | POA: Diagnosis not present

## 2022-09-17 DIAGNOSIS — Z510 Encounter for antineoplastic radiation therapy: Secondary | ICD-10-CM | POA: Diagnosis not present

## 2022-09-17 DIAGNOSIS — C3431 Malignant neoplasm of lower lobe, right bronchus or lung: Secondary | ICD-10-CM | POA: Diagnosis not present

## 2022-09-17 DIAGNOSIS — C3411 Malignant neoplasm of upper lobe, right bronchus or lung: Secondary | ICD-10-CM | POA: Diagnosis not present

## 2022-09-17 DIAGNOSIS — C7951 Secondary malignant neoplasm of bone: Secondary | ICD-10-CM | POA: Diagnosis not present

## 2022-09-17 LAB — RAD ONC ARIA SESSION SUMMARY
Course Elapsed Days: 15
Plan Fractions Treated to Date: 10
Plan Prescribed Dose Per Fraction: 2.5 Gy
Plan Total Fractions Prescribed: 15
Plan Total Prescribed Dose: 37.5 Gy
Reference Point Dosage Given to Date: 25 Gy
Reference Point Session Dosage Given: 2.5 Gy
Session Number: 10

## 2022-09-18 ENCOUNTER — Ambulatory Visit
Admission: RE | Admit: 2022-09-18 | Discharge: 2022-09-18 | Disposition: A | Discharge status: Home / Self Care | Payer: Medicare Other | Source: Ambulatory Visit | Attending: Radiation Oncology | Admitting: Radiation Oncology

## 2022-09-18 ENCOUNTER — Encounter: Payer: Medicare Other | Admitting: Physician Assistant

## 2022-09-18 ENCOUNTER — Other Ambulatory Visit: Payer: Self-pay

## 2022-09-18 DIAGNOSIS — C782 Secondary malignant neoplasm of pleura: Secondary | ICD-10-CM | POA: Diagnosis not present

## 2022-09-18 DIAGNOSIS — C3411 Malignant neoplasm of upper lobe, right bronchus or lung: Secondary | ICD-10-CM | POA: Diagnosis not present

## 2022-09-18 DIAGNOSIS — C7951 Secondary malignant neoplasm of bone: Secondary | ICD-10-CM | POA: Diagnosis not present

## 2022-09-18 DIAGNOSIS — F17210 Nicotine dependence, cigarettes, uncomplicated: Secondary | ICD-10-CM | POA: Diagnosis not present

## 2022-09-18 DIAGNOSIS — C3431 Malignant neoplasm of lower lobe, right bronchus or lung: Secondary | ICD-10-CM | POA: Diagnosis not present

## 2022-09-18 DIAGNOSIS — Z5112 Encounter for antineoplastic immunotherapy: Secondary | ICD-10-CM | POA: Diagnosis not present

## 2022-09-18 DIAGNOSIS — Z510 Encounter for antineoplastic radiation therapy: Secondary | ICD-10-CM | POA: Diagnosis not present

## 2022-09-18 LAB — RAD ONC ARIA SESSION SUMMARY
Course Elapsed Days: 16
Plan Fractions Treated to Date: 11
Plan Prescribed Dose Per Fraction: 2.5 Gy
Plan Total Fractions Prescribed: 15
Plan Total Prescribed Dose: 37.5 Gy
Reference Point Dosage Given to Date: 27.5 Gy
Reference Point Session Dosage Given: 2.5 Gy
Session Number: 11

## 2022-09-18 NOTE — Progress Notes (Signed)
During patient's weekly radiation check in, he mentioned a worsening rash to bilateral arms and mild rash to bilateral legs since his first cycle of systemic therapy (photos for reference below). [facility] team updated since patient's medical oncologist is not in the office today and agreed to work patient in for assessment/recommendations. Patient and wife made aware that they would have to wait, but would be seen when room available and they were agreeable. Instructed to head to blue waiting room on first floor. Patient and wife ambulated out of clinic unassisted and without incident  Right arm   Left arm   Left leg   Right leg

## 2022-09-18 NOTE — Progress Notes (Incomplete)
Symptom Management Consult note [place]    Patient Care Team: [name], MD as PCP - General (Family Medicine) Fay Records, MD as PCP - Cardiology (Cardiology) Collene Gobble, MD as Consulting Physician (Pulmonary Disease)    Name of the patient: Daniel Ball  [id number]  Jan 15, 1956   Date of visit: 09/18/2022   Chief Complaint/Reason for visit: rash   Current Therapy: carboplatin, cemiplimab-rwlc, and paclitaxel  Last treatment:  Day 1   Cycle 1 on 09/02/22   ASSESSMENT & PLAN: Patient is a 66 y.o. male  with oncologic history of lung squamous cell carcinoma followed by Dr. Julien Nordmann.  I have viewed most recent oncology note and lab work.    #) Lung squamous cell carcinoma -Had first treatment 08/23/22. Undergoing radiation currently. - Next appointment with oncologist is 09/24/21   #)       Heme/Onc History: Oncology History  Non-small cell lung cancer ([place])  03/19/2019 Initial Diagnosis   Non-small cell lung cancer ([place])   09/02/2022 -  Chemotherapy   Patient is on Treatment Plan : LUNG Carboplatin + Paclitaxel q21d Dose Reduction     Malignant neoplasm of right upper lobe of lung ([place])  08/21/2020 Initial Diagnosis   Malignant neoplasm of right upper lobe of lung ([place])   09/08/2022 Cancer Staging   Staging form: Lung, AJCC 8th Edition - Clinical: Stage IVA (cT2a, cN2, cM1a) - Signed by Curt Bears, MD on 09/08/2022       Interval history-: Daniel Ball is a 66 y.o. male with oncologic history as above presenting to [facility] today with chief complaint of      ROS  All other systems are reviewed and are negative for acute change except as noted in the HPI.    No Known Allergies   Past Medical History:  Diagnosis Date  . Arthritis   . Atrial fibrillation ([place]) 11/02/2019  . COPD (chronic obstructive pulmonary disease) ([place])   . Coronary artery disease    a. 12/2019: s/p orbital atherectomy and DES  placement to proximal/mid LAD.   . Diabetes mellitus without complication ([place])   . GERD (gastroesophageal reflux disease)   . History of radiation therapy 09/11/20, 09/16/21, 09/18/20    Right lung- SBRT Dr. Sondra Come   . Hyperlipidemia   . Hypertension    hx HBP - MEDS DC'D 10 YRS since BP has been in normal range  . Impingement syndrome of shoulder    left  . Non-small cell cancer of right lung ([place])   . Shortness of breath    with exertion  . Sleep apnea    uses a cpap  . Spinal stenosis      Past Surgical History:  Procedure Laterality Date  . BRONCHIAL BIOPSY  04/09/2020   Procedure: BRONCHIAL BIOPSIES;  Surgeon: Collene Gobble, MD;  Location: [facility] ENDOSCOPY;  Service: Pulmonary;;  . BRONCHIAL BIOPSY  10/20/2021   Procedure: BRONCHIAL BIOPSIES;  Surgeon: Collene Gobble, MD;  Location: [facility] ENDOSCOPY;  Service: Pulmonary;;  . BRONCHIAL BRUSHINGS  04/09/2020   Procedure: BRONCHIAL BRUSHINGS;  Surgeon: Collene Gobble, MD;  Location: [facility] ENDOSCOPY;  Service: Pulmonary;;  . BRONCHIAL BRUSHINGS  07/30/2020   Procedure: BRONCHIAL BRUSHINGS;  Surgeon: Collene Gobble, MD;  Location: [facility] ENDOSCOPY;  Service: Pulmonary;;  . BRONCHIAL BRUSHINGS  10/20/2021   Procedure: BRONCHIAL BRUSHINGS;  Surgeon: Collene Gobble, MD;  Location: [facility] ENDOSCOPY;  Service: Pulmonary;;  . BRONCHIAL NEEDLE ASPIRATION BIOPSY  04/09/2020   Procedure: BRONCHIAL NEEDLE ASPIRATION BIOPSIES;  Surgeon: Collene Gobble, MD;  Location: [facility] ENDOSCOPY;  Service: Pulmonary;;  . BRONCHIAL NEEDLE ASPIRATION BIOPSY  07/30/2020   Procedure: BRONCHIAL NEEDLE ASPIRATION BIOPSIES;  Surgeon: Collene Gobble, MD;  Location: [facility] ENDOSCOPY;  Service: Pulmonary;;  . BRONCHIAL NEEDLE ASPIRATION BIOPSY  10/20/2021   Procedure: BRONCHIAL NEEDLE ASPIRATION BIOPSIES;  Surgeon: Collene Gobble, MD;  Location: [facility] ENDOSCOPY;  Service: Pulmonary;;  . BRONCHIAL WASHINGS  04/09/2020   Procedure: BRONCHIAL WASHINGS;  Surgeon: Collene Gobble, MD;   Location: [place];  Service: Pulmonary;;  . BRONCHIAL WASHINGS  07/30/2020   Procedure: BRONCHIAL WASHINGS;  Surgeon: Collene Gobble, MD;  Location: [place];  Service: Pulmonary;;  . BRONCHIAL WASHINGS  10/20/2021   Procedure: BRONCHIAL WASHINGS;  Surgeon: Collene Gobble, MD;  Location: [facility] ENDOSCOPY;  Service: Pulmonary;;  . CORONARY ATHERECTOMY N/A 01/18/2020   Procedure: CORONARY ATHERECTOMY;  Surgeon: Nelva Bush, MD;  Location: [place] CV LAB;  Service: Cardiovascular;  Laterality: N/A;  . CORONARY STENT INTERVENTION  01/18/2020  . CORONARY STENT INTERVENTION N/A 01/18/2020   Procedure: CORONARY STENT INTERVENTION;  Surgeon: Nelva Bush, MD;  Location: [place] CV LAB;  Service: Cardiovascular;  Laterality: N/A;  . EYE SURGERY Left   . FIDUCIAL MARKER PLACEMENT  04/09/2020   Procedure: FIDUCIAL MARKER PLACEMENT;  Surgeon: Collene Gobble, MD;  Location: [facility] ENDOSCOPY;  Service: Pulmonary;;  . FIDUCIAL MARKER PLACEMENT  10/20/2021   Procedure: FIDUCIAL MARKER PLACEMENT;  Surgeon: Collene Gobble, MD;  Location: [facility] ENDOSCOPY;  Service: Pulmonary;;  . FINE NEEDLE ASPIRATION  04/09/2020   Procedure: FINE NEEDLE ASPIRATION (FNA) LINEAR;  Surgeon: Collene Gobble, MD;  Location: [place] ENDOSCOPY;  Service: Pulmonary;;  . HAND SURGERY Right    thumb  . INTRAVASCULAR PRESSURE WIRE/FFR STUDY N/A 11/02/2019   Procedure: INTRAVASCULAR PRESSURE WIRE/FFR STUDY;  Surgeon: Nelva Bush, MD;  Location: [place] CV LAB;  Service: Cardiovascular;  Laterality: N/A;  . INTRAVASCULAR ULTRASOUND/IVUS N/A 01/18/2020   Procedure: Intravascular Ultrasound/IVUS;  Surgeon: Nelva Bush, MD;  Location: [place] CV LAB;  Service: Cardiovascular;  Laterality: N/A;  . LUMBAR LAMINECTOMY/DECOMPRESSION MICRODISCECTOMY N/A 02/07/2014   Procedure: LUMBAR DECOMPRESSION Lumbar one-Lumbar five;  Surgeon: [name], MD;  Location: [facility] ORS;  Service: Orthopedics;  Laterality: N/A;  . RIGHT/LEFT  HEART CATH AND CORONARY ANGIOGRAPHY N/A 11/02/2019   Procedure: RIGHT/LEFT HEART CATH AND CORONARY ANGIOGRAPHY;  Surgeon: Nelva Bush, MD;  Location: [place] CV LAB;  Service: Cardiovascular;  Laterality: N/A;  . SHOULDER ARTHROSCOPY WITH SUBACROMIAL DECOMPRESSION Left 04/12/2014   Procedure: LEFT SHOULDER ARTHROSCOPY WITH SUBACROMIAL DECOMPRESSION AND LABRAL DEBRIDEMENT, ROTATOR CUFF DEBRIDEMENT, BICEPS DEBRIDEMENT;  Surgeon: [name], MD;  Location: [facility] ORS;  Service: Orthopedics;  Laterality: Left;  [name] VIDEO BRONCHOSCOPY WITH ENDOBRONCHIAL NAVIGATION N/A 04/09/2020   Procedure: VIDEO BRONCHOSCOPY WITH ENDOBRONCHIAL NAVIGATION;  Surgeon: Collene Gobble, MD;  Location: [place] ENDOSCOPY;  Service: Pulmonary;  Laterality: N/A;  . VIDEO BRONCHOSCOPY WITH ENDOBRONCHIAL NAVIGATION Right 07/30/2020   Procedure: VIDEO BRONCHOSCOPY WITH ENDOBRONCHIAL NAVIGATION;  Surgeon: Collene Gobble, MD;  Location: [place] ENDOSCOPY;  Service: Pulmonary;  Laterality: Right;  [name] VIDEO BRONCHOSCOPY WITH ENDOBRONCHIAL ULTRASOUND N/A 04/09/2020   Procedure: VIDEO BRONCHOSCOPY WITH ENDOBRONCHIAL ULTRASOUND;  Surgeon: Collene Gobble, MD;  Location: [facility] ENDOSCOPY;  Service: Pulmonary;  Laterality: N/A;  . VIDEO BRONCHOSCOPY WITH RADIAL ENDOBRONCHIAL ULTRASOUND  04/09/2020   Procedure: VIDEO BRONCHOSCOPY WITH RADIAL ENDOBRONCHIAL ULTRASOUND;  Surgeon:  Collene Gobble, MD;  Location: [facility] ENDOSCOPY;  Service: Pulmonary;;  . VIDEO BRONCHOSCOPY WITH RADIAL ENDOBRONCHIAL ULTRASOUND  10/20/2021   Procedure: VIDEO BRONCHOSCOPY WITH RADIAL ENDOBRONCHIAL ULTRASOUND;  Surgeon: Collene Gobble, MD;  Location: [place] ENDOSCOPY;  Service: Pulmonary;;    Social History   Socioeconomic History  . Marital status: Married    Spouse name: Not on file  . Number of children: Not on file  . Years of education: Not on file  . Highest education level: Not on file  Occupational History  . Occupation: Pipefitter  Tobacco Use  . Smoking status:  Every Day    Packs/day: 0.50    Years: 54.00    Total pack years: 27.00    Types: Cigarettes  . Smokeless tobacco: Never  . Tobacco comments:    10 cigarettes a day ARJ 11/04/21  Vaping Use  . Vaping Use: Never used  Substance and Sexual Activity  . Alcohol use: No  . Drug use: No  . Sexual activity: Not on file  Other Topics Concern  . Not on file  Social History Narrative  . Not on file   Social Determinants of Health   Financial Resource Strain: Not on file  Food Insecurity: No Food Insecurity (09/15/2022)   Hunger Vital Sign   . Worried About Charity fundraiser in the Last Year: Never true   . Ran Out of Food in the Last Year: Never true  Transportation Needs: No Transportation Needs (09/15/2022)   PRAPARE - Transportation   . Lack of Transportation (Medical): No   . Lack of Transportation (Non-Medical): No  Physical Activity: Not on file  Stress: Not on file  Social Connections: Not on file  Intimate Partner Violence: Not At Risk (09/15/2022)   Humiliation, Afraid, Rape, and Kick questionnaire   . Fear of Current or Ex-Partner: No   . Emotionally Abused: No   . Physically Abused: No   . Sexually Abused: No    Family History  Family history unknown: Yes     Current Outpatient Medications:  .  albuterol (VENTOLIN HFA) 108 (90 Base) MCG/ACT inhaler, Inhale 2 puffs into the lungs every 6 (six) hours as needed for wheezing or shortness of breath., Disp: 18 g, Rfl: 3 .  apixaban (ELIQUIS) 5 MG TABS tablet, Take 1 tablet (5 mg total) by mouth 2 (two) times daily., Disp: 180 tablet, Rfl: 3 .  buPROPion (WELLBUTRIN XL) 300 MG 24 hr tablet, Take 300 mg by mouth daily., Disp: , Rfl:  .  clopidogrel (PLAVIX) 75 MG tablet, Take 1 tablet (75 mg total) by mouth daily with breakfast. Okay to restart this medication on 10/21/2021 (Patient taking differently: Take 75 mg by mouth daily with breakfast.), Disp: 90 tablet, Rfl: 3 .  cyclobenzaprine (FLEXERIL) 10 MG tablet, Take 10 mg  by mouth 2 (two) times daily as needed for muscle spasms., Disp: , Rfl:  .  diltiazem (CARDIZEM CD) 180 MG 24 hr capsule, Take 1 capsule (180 mg total) by mouth daily., Disp: 30 capsule, Rfl: 1 .  furosemide (LASIX) 40 MG tablet, TAKE 1 TABLET BY MOUTH ONCE DAILY AS NEEDED FOR  EDEMA  OR  WEIGHT  GAIN (Patient taking differently: Take 40 mg by mouth in the morning.), Disp: 90 tablet, Rfl: 3 .  isosorbide mononitrate (IMDUR) 60 MG 24 hr tablet, Take 1 tablet (60 mg total) by mouth daily., Disp: 90 tablet, Rfl: 3 .  metFORMIN (GLUCOPHAGE-XR) 500 MG 24 hr tablet, Take 1,000  mg by mouth 2 (two) times daily with a meal., Disp: , Rfl:  .  metoprolol succinate (TOPROL-XL) 25 MG 24 hr tablet, Take 1 tablet (25 mg total) by mouth daily., Disp: 90 tablet, Rfl: 3 .  nitroGLYCERIN (NITROSTAT) 0.4 MG SL tablet, Place 1 tablet (0.4 mg total) under the tongue every 5 (five) minutes x 3 doses as needed for chest pain., Disp: 25 tablet, Rfl: 2 .  pantoprazole (PROTONIX) 40 MG tablet, Take 1 tablet (40 mg total) by mouth daily. (Patient taking differently: Take 40 mg by mouth daily before breakfast.), Disp: 60 tablet, Rfl: 2 .  potassium chloride SA (KLOR-CON M) 20 MEQ tablet, TAKE 1 TABLET BY MOUTH ONCE DAILY AS NEEDED - TAKE ON THE DAYS YOU TAKE LASIX (Patient taking differently: Take 20 mEq by mouth daily.), Disp: 90 tablet, Rfl: 3 .  PRESCRIPTION MEDICATION, See admin instructions. CPAP- Use during any time of rest, Disp: , Rfl:  .  prochlorperazine (COMPAZINE) 10 MG tablet, Take 1 tablet (10 mg total) by mouth every 6 (six) hours as needed. (Patient taking differently: Take 10 mg by mouth every 6 (six) hours as needed for nausea or vomiting.), Disp: 30 tablet, Rfl: 2 .  rosuvastatin (CRESTOR) 40 MG tablet, Take 1 tablet (40 mg total) by mouth daily., Disp: 90 tablet, Rfl: 3 .  TYLENOL 500 MG tablet, Take 500-1,000 mg by mouth every 6 (six) hours as needed for mild pain or headache., Disp: , Rfl:  .   umeclidinium-vilanterol (ANORO ELLIPTA) 62.5-25 MCG/ACT AEPB, Inhale 1 puff into the lungs daily. (Patient not taking: Reported on 08/19/2022), Disp: 180 each, Rfl: 3  PHYSICAL EXAM: ECOG FS:{CHL ONC PJ:[id number]}   There were no vitals filed for this visit. Physical Exam     LABORATORY DATA: I have reviewed the data as listed    Latest Ref Rng & Units 09/16/2022    5:17 AM 09/15/2022    5:02 AM 09/08/2022   10:50 AM  CBC  WBC 4.0 - 10.5 K/uL 9.1  8.5  9.6   Hemoglobin 13.0 - 17.0 g/dL 12.7  13.9  14.2   Hematocrit 39.0 - 52.0 % 38.0  40.8  41.2   Platelets 150 - 400 K/uL 66  72  117         Latest Ref Rng & Units 09/16/2022    5:17 AM 09/15/2022    6:58 AM 09/15/2022    5:02 AM  CMP  Glucose 70 - 99 mg/dL 159   172   BUN 8 - 23 mg/dL 23   15   Creatinine 0.61 - 1.24 mg/dL 1.05   1.03   Sodium 135 - 145 mmol/L 135   133   Potassium 3.5 - 5.1 mmol/L 4.1   4.0   Chloride 98 - 111 mmol/L 99   98   CO2 22 - 32 mmol/L 26   22   Calcium 8.9 - 10.3 mg/dL 8.8   9.0   Total Protein 6.5 - 8.1 g/dL  7.6    Total Bilirubin 0.3 - 1.2 mg/dL  0.8    Alkaline Phos 38 - 126 U/L  125    AST 15 - 41 U/L  38    ALT 0 - 44 U/L  43         RADIOGRAPHIC STUDIES (from last 24 hours if applicable) I have personally reviewed the radiological images as listed and agreed with the findings in the report. No results found.  Visit Diagnosis: No diagnosis found.   No orders of the defined types were placed in this encounter.   All questions were answered. The patient knows to call the clinic with any problems, questions or concerns. No barriers to learning was detected.  I have spent a total of *** minutes minutes of face-to-face and non-face-to-face time, preparing to see the patient, obtaining and/or reviewing separately obtained history, performing a medically appropriate examination, counseling and educating the patient, ordering tests, documenting clinical information in  the electronic health record, and care coordination (communications with other health care professionals or caregivers).    Thank you for allowing me to participate in the care of this patient.    Barrie Folk, PA-C Department of Hematology/Oncology [facility] at [facility] Phone: [phone]  [phone]    09/18/2022 11:26 AM

## 2022-09-22 ENCOUNTER — Telehealth: Payer: Self-pay | Admitting: Internal Medicine

## 2022-09-22 ENCOUNTER — Other Ambulatory Visit: Payer: Self-pay

## 2022-09-22 ENCOUNTER — Telehealth: Payer: Self-pay | Admitting: Medical Oncology

## 2022-09-22 ENCOUNTER — Emergency Department (HOSPITAL_COMMUNITY): Payer: Medicare Other

## 2022-09-22 ENCOUNTER — Encounter: Payer: Self-pay | Admitting: Internal Medicine

## 2022-09-22 ENCOUNTER — Encounter (HOSPITAL_COMMUNITY): Admission: RE | Payer: Self-pay | Source: Home / Self Care

## 2022-09-22 ENCOUNTER — Ambulatory Visit
Admission: RE | Admit: 2022-09-22 | Discharge: 2022-09-22 | Disposition: A | Discharge status: Home / Self Care | Payer: Medicare Other | Source: Ambulatory Visit | Attending: Radiation Oncology | Admitting: Radiation Oncology

## 2022-09-22 ENCOUNTER — Encounter: Payer: Self-pay | Admitting: Physician Assistant

## 2022-09-22 ENCOUNTER — Encounter (HOSPITAL_COMMUNITY): Payer: Self-pay | Admitting: *Deleted

## 2022-09-22 ENCOUNTER — Ambulatory Visit (HOSPITAL_COMMUNITY): Admission: RE | Admit: 2022-09-22 | Payer: Medicare Other | Source: Home / Self Care | Admitting: Internal Medicine

## 2022-09-22 ENCOUNTER — Inpatient Hospital Stay (HOSPITAL_COMMUNITY)
Admission: EM | Admit: 2022-09-22 | Discharge: 2022-09-25 | DRG: 308 | Disposition: A | Discharge status: Hospice | Payer: Medicare Other | Attending: Family Medicine | Admitting: Family Medicine

## 2022-09-22 DIAGNOSIS — I200 Unstable angina: Secondary | ICD-10-CM | POA: Diagnosis not present

## 2022-09-22 DIAGNOSIS — Z66 Do not resuscitate: Secondary | ICD-10-CM | POA: Diagnosis not present

## 2022-09-22 DIAGNOSIS — J8489 Other specified interstitial pulmonary diseases: Secondary | ICD-10-CM | POA: Diagnosis present

## 2022-09-22 DIAGNOSIS — Z7902 Long term (current) use of antithrombotics/antiplatelets: Secondary | ICD-10-CM

## 2022-09-22 DIAGNOSIS — D630 Anemia in neoplastic disease: Secondary | ICD-10-CM | POA: Diagnosis present

## 2022-09-22 DIAGNOSIS — C3490 Malignant neoplasm of unspecified part of unspecified bronchus or lung: Secondary | ICD-10-CM | POA: Diagnosis not present

## 2022-09-22 DIAGNOSIS — I10 Essential (primary) hypertension: Secondary | ICD-10-CM | POA: Diagnosis not present

## 2022-09-22 DIAGNOSIS — C782 Secondary malignant neoplasm of pleura: Secondary | ICD-10-CM | POA: Insufficient documentation

## 2022-09-22 DIAGNOSIS — Z1152 Encounter for screening for COVID-19: Secondary | ICD-10-CM

## 2022-09-22 DIAGNOSIS — R451 Restlessness and agitation: Secondary | ICD-10-CM | POA: Diagnosis not present

## 2022-09-22 DIAGNOSIS — I110 Hypertensive heart disease with heart failure: Secondary | ICD-10-CM | POA: Diagnosis present

## 2022-09-22 DIAGNOSIS — Z923 Personal history of irradiation: Secondary | ICD-10-CM

## 2022-09-22 DIAGNOSIS — R0602 Shortness of breath: Secondary | ICD-10-CM | POA: Diagnosis not present

## 2022-09-22 DIAGNOSIS — J9600 Acute respiratory failure, unspecified whether with hypoxia or hypercapnia: Secondary | ICD-10-CM | POA: Diagnosis not present

## 2022-09-22 DIAGNOSIS — D696 Thrombocytopenia, unspecified: Secondary | ICD-10-CM | POA: Diagnosis not present

## 2022-09-22 DIAGNOSIS — Z5189 Encounter for other specified aftercare: Secondary | ICD-10-CM | POA: Insufficient documentation

## 2022-09-22 DIAGNOSIS — F17210 Nicotine dependence, cigarettes, uncomplicated: Secondary | ICD-10-CM | POA: Diagnosis not present

## 2022-09-22 DIAGNOSIS — F40240 Claustrophobia: Secondary | ICD-10-CM | POA: Diagnosis present

## 2022-09-22 DIAGNOSIS — C3411 Malignant neoplasm of upper lobe, right bronchus or lung: Secondary | ICD-10-CM | POA: Diagnosis present

## 2022-09-22 DIAGNOSIS — K219 Gastro-esophageal reflux disease without esophagitis: Secondary | ICD-10-CM | POA: Diagnosis present

## 2022-09-22 DIAGNOSIS — I4891 Unspecified atrial fibrillation: Secondary | ICD-10-CM | POA: Diagnosis not present

## 2022-09-22 DIAGNOSIS — Z510 Encounter for antineoplastic radiation therapy: Secondary | ICD-10-CM | POA: Diagnosis not present

## 2022-09-22 DIAGNOSIS — R918 Other nonspecific abnormal finding of lung field: Secondary | ICD-10-CM | POA: Diagnosis not present

## 2022-09-22 DIAGNOSIS — J449 Chronic obstructive pulmonary disease, unspecified: Secondary | ICD-10-CM | POA: Diagnosis present

## 2022-09-22 DIAGNOSIS — Z5112 Encounter for antineoplastic immunotherapy: Secondary | ICD-10-CM | POA: Insufficient documentation

## 2022-09-22 DIAGNOSIS — Z515 Encounter for palliative care: Secondary | ICD-10-CM | POA: Diagnosis not present

## 2022-09-22 DIAGNOSIS — R000 Tachycardia, unspecified: Secondary | ICD-10-CM | POA: Diagnosis not present

## 2022-09-22 DIAGNOSIS — I509 Heart failure, unspecified: Secondary | ICD-10-CM | POA: Diagnosis present

## 2022-09-22 DIAGNOSIS — Z9221 Personal history of antineoplastic chemotherapy: Secondary | ICD-10-CM

## 2022-09-22 DIAGNOSIS — Z7189 Other specified counseling: Secondary | ICD-10-CM | POA: Diagnosis not present

## 2022-09-22 DIAGNOSIS — E871 Hypo-osmolality and hyponatremia: Secondary | ICD-10-CM | POA: Diagnosis not present

## 2022-09-22 DIAGNOSIS — I2510 Atherosclerotic heart disease of native coronary artery without angina pectoris: Secondary | ICD-10-CM | POA: Diagnosis present

## 2022-09-22 DIAGNOSIS — G4733 Obstructive sleep apnea (adult) (pediatric): Secondary | ICD-10-CM | POA: Diagnosis not present

## 2022-09-22 DIAGNOSIS — J849 Interstitial pulmonary disease, unspecified: Secondary | ICD-10-CM | POA: Diagnosis not present

## 2022-09-22 DIAGNOSIS — D72829 Elevated white blood cell count, unspecified: Secondary | ICD-10-CM | POA: Diagnosis not present

## 2022-09-22 DIAGNOSIS — R7989 Other specified abnormal findings of blood chemistry: Secondary | ICD-10-CM | POA: Diagnosis not present

## 2022-09-22 DIAGNOSIS — Z79899 Other long term (current) drug therapy: Secondary | ICD-10-CM

## 2022-09-22 DIAGNOSIS — E66813 Obesity, class 3: Secondary | ICD-10-CM | POA: Diagnosis present

## 2022-09-22 DIAGNOSIS — M1990 Unspecified osteoarthritis, unspecified site: Secondary | ICD-10-CM | POA: Diagnosis present

## 2022-09-22 DIAGNOSIS — J9601 Acute respiratory failure with hypoxia: Secondary | ICD-10-CM | POA: Diagnosis present

## 2022-09-22 DIAGNOSIS — Z6841 Body Mass Index (BMI) 40.0 and over, adult: Secondary | ICD-10-CM | POA: Diagnosis not present

## 2022-09-22 DIAGNOSIS — C7989 Secondary malignant neoplasm of other specified sites: Secondary | ICD-10-CM | POA: Diagnosis present

## 2022-09-22 DIAGNOSIS — E782 Mixed hyperlipidemia: Secondary | ICD-10-CM | POA: Diagnosis not present

## 2022-09-22 DIAGNOSIS — I4819 Other persistent atrial fibrillation: Principal | ICD-10-CM | POA: Diagnosis present

## 2022-09-22 DIAGNOSIS — E1165 Type 2 diabetes mellitus with hyperglycemia: Secondary | ICD-10-CM | POA: Diagnosis not present

## 2022-09-22 DIAGNOSIS — C7951 Secondary malignant neoplasm of bone: Secondary | ICD-10-CM | POA: Insufficient documentation

## 2022-09-22 DIAGNOSIS — Z7901 Long term (current) use of anticoagulants: Secondary | ICD-10-CM

## 2022-09-22 DIAGNOSIS — Z72820 Sleep deprivation: Secondary | ICD-10-CM

## 2022-09-22 DIAGNOSIS — R079 Chest pain, unspecified: Secondary | ICD-10-CM | POA: Diagnosis not present

## 2022-09-22 DIAGNOSIS — C3431 Malignant neoplasm of lower lobe, right bronchus or lung: Secondary | ICD-10-CM | POA: Insufficient documentation

## 2022-09-22 DIAGNOSIS — I25118 Atherosclerotic heart disease of native coronary artery with other forms of angina pectoris: Secondary | ICD-10-CM | POA: Diagnosis not present

## 2022-09-22 DIAGNOSIS — Z7984 Long term (current) use of oral hypoglycemic drugs: Secondary | ICD-10-CM

## 2022-09-22 DIAGNOSIS — Z955 Presence of coronary angioplasty implant and graft: Secondary | ICD-10-CM

## 2022-09-22 DIAGNOSIS — R911 Solitary pulmonary nodule: Secondary | ICD-10-CM | POA: Diagnosis present

## 2022-09-22 LAB — CBC
HCT: 32.9 % — ABNORMAL LOW (ref 39.0–52.0)
Hemoglobin: 11.1 g/dL — ABNORMAL LOW (ref 13.0–17.0)
MCH: 30.9 pg (ref 26.0–34.0)
MCHC: 33.7 g/dL (ref 30.0–36.0)
MCV: 91.6 fL (ref 80.0–100.0)
Platelets: 219 10*3/uL (ref 150–400)
RBC: 3.59 MIL/uL — ABNORMAL LOW (ref 4.22–5.81)
RDW: 15.5 % (ref 11.5–15.5)
WBC: 12.3 10*3/uL — ABNORMAL HIGH (ref 4.0–10.5)
nRBC: 0 % (ref 0.0–0.2)

## 2022-09-22 LAB — RESP PANEL BY RT-PCR (RSV, FLU A&B, COVID)  RVPGX2
Influenza A by PCR: NEGATIVE
Influenza B by PCR: NEGATIVE
Resp Syncytial Virus by PCR: NEGATIVE
SARS Coronavirus 2 by RT PCR: NEGATIVE

## 2022-09-22 LAB — BASIC METABOLIC PANEL
Anion gap: 11 (ref 5–15)
BUN: 16 mg/dL (ref 8–23)
CO2: 21 mmol/L — ABNORMAL LOW (ref 22–32)
Calcium: 8.3 mg/dL — ABNORMAL LOW (ref 8.9–10.3)
Chloride: 99 mmol/L (ref 98–111)
Creatinine, Ser: 1.05 mg/dL (ref 0.61–1.24)
GFR, Estimated: >60 mL/min (ref >60)
Glucose, Bld: 162 mg/dL — ABNORMAL HIGH (ref 70–99)
Potassium: 4 mmol/L (ref 3.5–5.1)
Sodium: 131 mmol/L — ABNORMAL LOW (ref 135–145)

## 2022-09-22 LAB — RAD ONC ARIA SESSION SUMMARY
Course Elapsed Days: 20
Plan Fractions Treated to Date: 12
Plan Prescribed Dose Per Fraction: 2.5 Gy
Plan Total Fractions Prescribed: 15
Plan Total Prescribed Dose: 37.5 Gy
Reference Point Dosage Given to Date: 30 Gy
Reference Point Session Dosage Given: 2.5 Gy
Session Number: 12

## 2022-09-22 LAB — BRAIN NATRIURETIC PEPTIDE: B Natriuretic Peptide: 333 pg/mL — ABNORMAL HIGH (ref 0.0–100.0)

## 2022-09-22 LAB — TROPONIN I (HIGH SENSITIVITY)
Troponin I (High Sensitivity): 10 ng/L (ref <18)
Troponin I (High Sensitivity): 11 ng/L (ref <18)

## 2022-09-22 LAB — MAGNESIUM: Magnesium: 2.1 mg/dL (ref 1.7–2.4)

## 2022-09-22 SURGERY — ESOPHAGOGASTRODUODENOSCOPY (EGD) WITH PROPOFOL
Anesthesia: Monitor Anesthesia Care

## 2022-09-22 MED ORDER — DILTIAZEM HCL-DEXTROSE 125-5 MG/125ML-% IV SOLN (PREMIX)
5.0000 mg/h | INTRAVENOUS | Status: DC
  Administered 2022-09-22: 10 mg/h via INTRAVENOUS
  Administered 2022-09-22: 5 mg/h via INTRAVENOUS
  Administered 2022-09-23: 15 mg/h via INTRAVENOUS
  Administered 2022-09-23: 12.5 mg/h via INTRAVENOUS
  Administered 2022-09-23 (×2): 15 mg/h via INTRAVENOUS
  Filled 2022-09-22 (×4): qty 125

## 2022-09-22 NOTE — ED Notes (Signed)
RT Sherine at the bedside to set up BIPAP

## 2022-09-22 NOTE — ED Provider Notes (Signed)
[facility] EMERGENCY DEPARTMENT Provider Note   CSN: [id number] Arrival date & time: 09/22/22  1727     History {Add pertinent medical, surgical, social history, OB history to HPI:1} Chief Complaint  Patient presents with   Shortness of Breath    Daniel Ball is a 67 y.o. male.  He has a history of A-fib, COPD, lung cancer on active chemotherapy and radiation.  He was just discharged the hospital about 5 days ago after being admitted for shortness of breath A-fib with RVR.  Seen by cardiology and rate control adjusted.  Had been doing well for a few days but worsened again.  Complaining of some difficulty with breathing dyspnea on exertion nonproductive cough.  No known fevers.  Is having some chest discomfort.  The history is provided by the patient.  Shortness of Breath Severity:  Moderate Onset quality:  Gradual Duration:  3 weeks Timing:  Intermittent Progression:  Worsening Relieved by:  Nothing Worsened by:  Activity and coughing Ineffective treatments:  Diuretics Associated symptoms: chest pain, cough and vomiting   Associated symptoms: no abdominal pain, no fever, no hemoptysis and no sputum production   Risk factors: tobacco use        Home Medications Prior to Admission medications   Medication Sig Start Date End Date Taking? Authorizing Provider  albuterol (VENTOLIN HFA) 108 (90 Base) MCG/ACT inhaler Inhale 2 puffs into the lungs every 6 (six) hours as needed for wheezing or shortness of breath. 11/04/21   Collene Gobble, MD  apixaban (ELIQUIS) 5 MG TABS tablet Take 1 tablet (5 mg total) by mouth 2 (two) times daily. 12/25/21   Sueanne Margarita, MD  buPROPion (WELLBUTRIN XL) 300 MG 24 hr tablet Take 300 mg by mouth daily.    [provider]  clopidogrel (PLAVIX) 75 MG tablet Take 1 tablet (75 mg total) by mouth daily with breakfast. Okay to restart this medication on 10/21/2021 Patient taking differently: Take 75 mg by mouth daily with breakfast. 12/25/21    Sueanne Margarita, MD  cyclobenzaprine (FLEXERIL) 10 MG tablet Take 10 mg by mouth 2 (two) times daily as needed for muscle spasms.    [provider]  diltiazem (CARDIZEM CD) 180 MG 24 hr capsule Take 1 capsule (180 mg total) by mouth daily. 09/16/22   Nita Sells, MD  furosemide (LASIX) 40 MG tablet TAKE 1 TABLET BY MOUTH ONCE DAILY AS NEEDED FOR  EDEMA  OR  WEIGHT  GAIN Patient taking differently: Take 40 mg by mouth in the morning. 12/25/21   Sueanne Margarita, MD  isosorbide mononitrate (IMDUR) 60 MG 24 hr tablet Take 1 tablet (60 mg total) by mouth daily. 12/25/21   Sueanne Margarita, MD  metFORMIN (GLUCOPHAGE-XR) 500 MG 24 hr tablet Take 1,000 mg by mouth 2 (two) times daily with a meal.    [provider]  metoprolol succinate (TOPROL-XL) 25 MG 24 hr tablet Take 1 tablet (25 mg total) by mouth daily. 12/25/21   Sueanne Margarita, MD  nitroGLYCERIN (NITROSTAT) 0.4 MG SL tablet Place 1 tablet (0.4 mg total) under the tongue every 5 (five) minutes x 3 doses as needed for chest pain. 12/25/21 12/25/22  Sueanne Margarita, MD  pantoprazole (PROTONIX) 40 MG tablet Take 1 tablet (40 mg total) by mouth daily. Patient taking differently: Take 40 mg by mouth daily before breakfast. 01/20/20   Kathyrn Drown D, NP  potassium chloride SA (KLOR-CON M) 20 MEQ tablet TAKE 1 TABLET BY  MOUTH ONCE DAILY AS NEEDED - TAKE ON THE DAYS YOU TAKE LASIX Patient taking differently: Take 20 mEq by mouth daily. 12/25/21   Sueanne Margarita, MD  PRESCRIPTION MEDICATION See admin instructions. CPAP- Use during any time of rest    [provider]  prochlorperazine (COMPAZINE) 10 MG tablet Take 1 tablet (10 mg total) by mouth every 6 (six) hours as needed. Patient taking differently: Take 10 mg by mouth every 6 (six) hours as needed for nausea or vomiting. 08/26/22   Heilingoetter, Cassandra L, PA-C  rosuvastatin (CRESTOR) 40 MG tablet Take 1 tablet (40 mg total) by mouth daily. 12/25/21   Sueanne Margarita, MD   TYLENOL 500 MG tablet Take 500-1,000 mg by mouth every 6 (six) hours as needed for mild pain or headache.    [provider]  umeclidinium-vilanterol (ANORO ELLIPTA) 62.5-25 MCG/ACT AEPB Inhale 1 puff into the lungs daily. Patient not taking: Reported on 08/19/2022 10/22/21   Collene Gobble, MD      Allergies    Patient has no known allergies.    Review of Systems   Review of Systems  Constitutional:  Negative for fever.  Eyes:  Negative for visual disturbance.  Respiratory:  Positive for cough and shortness of breath. Negative for hemoptysis and sputum production.   Cardiovascular:  Positive for chest pain.  Gastrointestinal:  Positive for vomiting. Negative for abdominal pain.  Genitourinary:  Positive for frequency. Negative for dysuria.    Physical Exam Updated Vital Signs BP (!) 146/73   Pulse (!) 123   Temp 98.2 F (36.8 C) (Oral)   Resp (!) 30   SpO2 96%  Physical Exam Vitals and nursing note reviewed.  Constitutional:      General: He is in acute distress.     Appearance: He is well-developed.  HENT:     Head: Normocephalic and atraumatic.  Eyes:     Conjunctiva/sclera: Conjunctivae normal.  Cardiovascular:     Rate and Rhythm: Tachycardia present. Rhythm irregular.     Heart sounds: No murmur heard. Pulmonary:     Effort: Tachypnea, accessory muscle usage and respiratory distress present.     Breath sounds: Rhonchi present.  Abdominal:     Palpations: Abdomen is soft.     Tenderness: There is no abdominal tenderness. There is no guarding or rebound.  Musculoskeletal:        General: No swelling.     Cervical back: Neck supple.     Right lower leg: No tenderness. Edema present.     Left lower leg: No tenderness. Edema present.  Skin:    General: Skin is warm and dry.     Capillary Refill: Capillary refill takes less than 2 seconds.  Neurological:     General: No focal deficit present.     Mental Status: He is alert.     Sensory: No sensory  deficit.     Motor: No weakness.     ED Results / Procedures / Treatments   Labs (all labs ordered are listed, but only abnormal results are displayed) Labs Reviewed  BASIC METABOLIC PANEL  CBC  TROPONIN I (HIGH SENSITIVITY)    EKG None  Radiology No results found.  Procedures Procedures  {Document cardiac monitor, telemetry assessment procedure when appropriate:1}  Medications Ordered in ED Medications - No data to display  ED Course/ Medical Decision Making/ A&P  Medical Decision Making Amount and/or Complexity of Data Reviewed Labs: ordered.   This patient complains of ***; this involves an extensive number of treatment Options and is a complaint that carries with it a high risk of complications and morbidity. The differential includes ***  I ordered, reviewed and interpreted labs, which included *** I ordered medication *** and reviewed PMP when indicated. I ordered imaging studies which included *** and I independently    visualized and interpreted imaging which showed *** Additional history obtained from *** Previous records obtained and reviewed *** I consulted *** and discussed lab and imaging findings and discussed disposition.  Cardiac monitoring reviewed, *** Social determinants considered, *** Critical Interventions: ***  After the interventions stated above, I reevaluated the patient and found *** Admission and further testing considered, ***   {Document critical care time when appropriate:1} {Document review of labs and clinical decision tools ie heart score, Chads2Vasc2 etc:1}  {Document your independent review of radiology images, and any outside records:1} {Document your discussion with family members, caretakers, and with consultants:1} {Document social determinants of health affecting pt's care:1} {Document your decision making why or why not admission, treatments were needed:1} Final Clinical Impression(s) / ED  Diagnoses Final diagnoses:  None    Rx / DC Orders ED Discharge Orders     None

## 2022-09-22 NOTE — Telephone Encounter (Signed)
Pt cancelled GI procedure.

## 2022-09-22 NOTE — H&P (Incomplete)
History and Physical    Patient: Daniel Ball YBO:[id number] DOB: 1956-05-11 DOA: 09/22/2022 DOS: the patient was seen and examined on 09/23/2022 PCP: [name], MD  Patient coming from: Home  Chief Complaint:  Chief Complaint  Patient presents with   Shortness of Breath   HPI: Daniel Ball is a 67 y.o. male with medical history significant of atrial fibrillation, CAD, hypertension, hyperlipidemia, COPD, GERD lung cancer on active chemotherapy and radiation (follows with Dr. Earlie Server) and class III obesity who presents to the emergency department due to shortness of breath on exertion and nonproductive cough Patient was recently admitted to [facility] from 5/26 to 12/27 due to atrial fibrillation with RVR and this was treated with Cardizem.  He was discharged home and was doing fine for few days, but this worsened again and presented to the ED for further evaluation and management.  Patient complained of some chest discomfort which occurs mostly when he has palpitations.  ED Course:  In the emergency department, patient was tachypneic and tachycardic, BP was 146/73, O2 sat was 96% on NRB, was transitioned to BiPAP and O2 sats increased to 100% at FiO2 of 50%..  Workup in the ED showed leukocytosis and normocytic anemia.  BMP was normal except for hyponatremia and hyperglycemia.  BNP 333, troponin x 2 was negative, magnesium 2.1.  Influenza A, B, SARS coronavirus 2, RSV was negative. Chest x-ray showed no  significant interval change. Stable diffuse interstitial changes single lungs, possibly fibrosis. Patient was started on Cardizem drip, cardiology on-call at [facility] was consulted and states that patient can stay here at [facility].  Hospitalist was asked to admit patient for further evaluation and management.   Review of Systems: Review of systems as noted in the HPI. All other systems reviewed and are negative.   Past Medical History:  Diagnosis Date   Arthritis    Atrial  fibrillation ([place]) 11/02/2019   COPD (chronic obstructive pulmonary disease) (HCC)    Coronary artery disease    a. 12/2019: s/p orbital atherectomy and DES placement to proximal/mid LAD.    Diabetes mellitus without complication ([place])    GERD (gastroesophageal reflux disease)    History of radiation therapy 09/11/20, 09/16/21, 09/18/20    Right lung- SBRT Dr. Sondra Come    Hyperlipidemia    Hypertension    hx HBP - MEDS DC'D 10 YRS since BP has been in normal range   Impingement syndrome of shoulder    left   Non-small cell cancer of right lung (HCC)    Pericardial effusion    Shortness of breath    with exertion   Sleep apnea    uses a cpap   Spinal stenosis    Thrombocytopenia [facility])    Past Surgical History:  Procedure Laterality Date   BRONCHIAL BIOPSY  04/09/2020   Procedure: BRONCHIAL BIOPSIES;  Surgeon: Collene Gobble, MD;  Location: [place];  Service: Pulmonary;;   BRONCHIAL BIOPSY  10/20/2021   Procedure: BRONCHIAL BIOPSIES;  Surgeon: Collene Gobble, MD;  Location: [place];  Service: Pulmonary;;   BRONCHIAL BRUSHINGS  04/09/2020   Procedure: BRONCHIAL BRUSHINGS;  Surgeon: Collene Gobble, MD;  Location: [facility] ENDOSCOPY;  Service: Pulmonary;;   BRONCHIAL BRUSHINGS  07/30/2020   Procedure: BRONCHIAL BRUSHINGS;  Surgeon: Collene Gobble, MD;  Location: [facility] ENDOSCOPY;  Service: Pulmonary;;   BRONCHIAL BRUSHINGS  10/20/2021   Procedure: BRONCHIAL BRUSHINGS;  Surgeon: Collene Gobble, MD;  Location: [facility] ENDOSCOPY;  Service: Pulmonary;;  BRONCHIAL NEEDLE ASPIRATION BIOPSY  04/09/2020   Procedure: BRONCHIAL NEEDLE ASPIRATION BIOPSIES;  Surgeon: Collene Gobble, MD;  Location: [facility] ENDOSCOPY;  Service: Pulmonary;;   BRONCHIAL NEEDLE ASPIRATION BIOPSY  07/30/2020   Procedure: BRONCHIAL NEEDLE ASPIRATION BIOPSIES;  Surgeon: Collene Gobble, MD;  Location: [facility] ENDOSCOPY;  Service: Pulmonary;;   BRONCHIAL NEEDLE ASPIRATION BIOPSY  10/20/2021   Procedure: BRONCHIAL NEEDLE ASPIRATION  BIOPSIES;  Surgeon: Collene Gobble, MD;  Location: [facility] ENDOSCOPY;  Service: Pulmonary;;   BRONCHIAL WASHINGS  04/09/2020   Procedure: BRONCHIAL WASHINGS;  Surgeon: Collene Gobble, MD;  Location: [place];  Service: Pulmonary;;   BRONCHIAL WASHINGS  07/30/2020   Procedure: BRONCHIAL WASHINGS;  Surgeon: Collene Gobble, MD;  Location: [place];  Service: Pulmonary;;   BRONCHIAL WASHINGS  10/20/2021   Procedure: BRONCHIAL WASHINGS;  Surgeon: Collene Gobble, MD;  Location: [facility] ENDOSCOPY;  Service: Pulmonary;;   CORONARY ATHERECTOMY N/A 01/18/2020   Procedure: CORONARY ATHERECTOMY;  Surgeon: Nelva Bush, MD;  Location: [place] CV LAB;  Service: Cardiovascular;  Laterality: N/A;   CORONARY STENT INTERVENTION  01/18/2020   CORONARY STENT INTERVENTION N/A 01/18/2020   Procedure: CORONARY STENT INTERVENTION;  Surgeon: Nelva Bush, MD;  Location: [place] CV LAB;  Service: Cardiovascular;  Laterality: N/A;   EYE SURGERY Left    FIDUCIAL MARKER PLACEMENT  04/09/2020   Procedure: FIDUCIAL MARKER PLACEMENT;  Surgeon: Collene Gobble, MD;  Location: [place];  Service: Pulmonary;;   FIDUCIAL MARKER PLACEMENT  10/20/2021   Procedure: FIDUCIAL MARKER PLACEMENT;  Surgeon: Collene Gobble, MD;  Location: [facility] ENDOSCOPY;  Service: Pulmonary;;   FINE NEEDLE ASPIRATION  04/09/2020   Procedure: FINE NEEDLE ASPIRATION (FNA) LINEAR;  Surgeon: Collene Gobble, MD;  Location: [place] ENDOSCOPY;  Service: Pulmonary;;   HAND SURGERY Right    thumb   INTRAVASCULAR PRESSURE WIRE/FFR STUDY N/A 11/02/2019   Procedure: INTRAVASCULAR PRESSURE WIRE/FFR STUDY;  Surgeon: Nelva Bush, MD;  Location: [place] CV LAB;  Service: Cardiovascular;  Laterality: N/A;   INTRAVASCULAR ULTRASOUND/IVUS N/A 01/18/2020   Procedure: Intravascular Ultrasound/IVUS;  Surgeon: Nelva Bush, MD;  Location: [place] CV LAB;  Service: Cardiovascular;  Laterality: N/A;   LUMBAR LAMINECTOMY/DECOMPRESSION MICRODISCECTOMY N/A  02/07/2014   Procedure: LUMBAR DECOMPRESSION Lumbar one-Lumbar five;  Surgeon: [name], MD;  Location: [facility];  Service: Orthopedics;  Laterality: N/A;   RIGHT/LEFT HEART CATH AND CORONARY ANGIOGRAPHY N/A 11/02/2019   Procedure: RIGHT/LEFT HEART CATH AND CORONARY ANGIOGRAPHY;  Surgeon: Nelva Bush, MD;  Location: [place] CV LAB;  Service: Cardiovascular;  Laterality: N/A;   SHOULDER ARTHROSCOPY WITH SUBACROMIAL DECOMPRESSION Left 04/12/2014   Procedure: LEFT SHOULDER ARTHROSCOPY WITH SUBACROMIAL DECOMPRESSION AND LABRAL DEBRIDEMENT, ROTATOR CUFF DEBRIDEMENT, BICEPS DEBRIDEMENT;  Surgeon: [name], MD;  Location: [facility];  Service: Orthopedics;  Laterality: Left;   VIDEO BRONCHOSCOPY WITH ENDOBRONCHIAL NAVIGATION N/A 04/09/2020   Procedure: VIDEO BRONCHOSCOPY WITH ENDOBRONCHIAL NAVIGATION;  Surgeon: Collene Gobble, MD;  Location: [place] ENDOSCOPY;  Service: Pulmonary;  Laterality: N/A;   VIDEO BRONCHOSCOPY WITH ENDOBRONCHIAL NAVIGATION Right 07/30/2020   Procedure: VIDEO BRONCHOSCOPY WITH ENDOBRONCHIAL NAVIGATION;  Surgeon: Collene Gobble, MD;  Location: [place] ENDOSCOPY;  Service: Pulmonary;  Laterality: Right;   VIDEO BRONCHOSCOPY WITH ENDOBRONCHIAL ULTRASOUND N/A 04/09/2020   Procedure: VIDEO BRONCHOSCOPY WITH ENDOBRONCHIAL ULTRASOUND;  Surgeon: Collene Gobble, MD;  Location: [facility] ENDOSCOPY;  Service: Pulmonary;  Laterality: N/A;   VIDEO BRONCHOSCOPY WITH RADIAL ENDOBRONCHIAL ULTRASOUND  04/09/2020   Procedure: VIDEO BRONCHOSCOPY WITH  RADIAL ENDOBRONCHIAL ULTRASOUND;  Surgeon: Collene Gobble, MD;  Location: [facility] ENDOSCOPY;  Service: Pulmonary;;   VIDEO BRONCHOSCOPY WITH RADIAL ENDOBRONCHIAL ULTRASOUND  10/20/2021   Procedure: VIDEO BRONCHOSCOPY WITH RADIAL ENDOBRONCHIAL ULTRASOUND;  Surgeon: Collene Gobble, MD;  Location: [place] ENDOSCOPY;  Service: Pulmonary;;    Social History:  reports that he has been smoking cigarettes. He has a 27.00 pack-year smoking history. He has never used  smokeless tobacco. He reports that he does not drink alcohol and does not use drugs.   No Known Allergies  Family History  Family history unknown: Yes     Prior to Admission medications   Medication Sig Start Date End Date Taking? Authorizing Provider  albuterol (VENTOLIN HFA) 108 (90 Base) MCG/ACT inhaler Inhale 2 puffs into the lungs every 6 (six) hours as needed for wheezing or shortness of breath. 11/04/21  Yes Collene Gobble, MD  apixaban (ELIQUIS) 5 MG TABS tablet Take 1 tablet (5 mg total) by mouth 2 (two) times daily. 12/25/21  Yes Turner, Eber Hong, MD  buPROPion (WELLBUTRIN XL) 300 MG 24 hr tablet Take 300 mg by mouth daily.   Yes [provider]  clopidogrel (PLAVIX) 75 MG tablet Take 1 tablet (75 mg total) by mouth daily with breakfast. Okay to restart this medication on 10/21/2021 Patient taking differently: Take 75 mg by mouth daily with breakfast. 12/25/21  Yes Turner, Eber Hong, MD  cyclobenzaprine (FLEXERIL) 10 MG tablet Take 10 mg by mouth 2 (two) times daily as needed for muscle spasms.   Yes [provider]  diltiazem (CARDIZEM CD) 180 MG 24 hr capsule Take 1 capsule (180 mg total) by mouth daily. 09/16/22  Yes Nita Sells, MD  furosemide (LASIX) 40 MG tablet TAKE 1 TABLET BY MOUTH ONCE DAILY AS NEEDED FOR  EDEMA  OR  WEIGHT  GAIN Patient taking differently: Take 40 mg by mouth in the morning. 12/25/21  Yes Turner, Eber Hong, MD  isosorbide mononitrate (IMDUR) 60 MG 24 hr tablet Take 1 tablet (60 mg total) by mouth daily. 12/25/21  Yes Turner, Eber Hong, MD  metFORMIN (GLUCOPHAGE-XR) 500 MG 24 hr tablet Take 1,000 mg by mouth 2 (two) times daily with a meal.   Yes [provider]  metoprolol succinate (TOPROL-XL) 25 MG 24 hr tablet Take 1 tablet (25 mg total) by mouth daily. 12/25/21  Yes Turner, Eber Hong, MD  nitroGLYCERIN (NITROSTAT) 0.4 MG SL tablet Place 1 tablet (0.4 mg total) under the tongue every 5 (five) minutes x 3 doses as needed for chest pain.  12/25/21 12/25/22 Yes Turner, Eber Hong, MD  pantoprazole (PROTONIX) 40 MG tablet Take 1 tablet (40 mg total) by mouth daily. Patient taking differently: Take 40 mg by mouth daily before breakfast. 01/20/20  Yes Kathyrn Drown D, NP  potassium chloride SA (KLOR-CON M) 20 MEQ tablet TAKE 1 TABLET BY MOUTH ONCE DAILY AS NEEDED - TAKE ON THE DAYS YOU TAKE LASIX Patient taking differently: Take 20 mEq by mouth daily. 12/25/21  Yes Sueanne Margarita, MD  PRESCRIPTION MEDICATION See admin instructions. CPAP- Use during any time of rest   Yes [provider]  prochlorperazine (COMPAZINE) 10 MG tablet Take 1 tablet (10 mg total) by mouth every 6 (six) hours as needed. Patient taking differently: Take 10 mg by mouth every 6 (six) hours as needed for nausea or vomiting. 08/26/22  Yes Heilingoetter, Cassandra L, PA-C  rosuvastatin (CRESTOR) 40 MG tablet Take 1 tablet (40 mg total) by mouth  daily. 12/25/21  Yes Turner, Eber Hong, MD  TYLENOL 500 MG tablet Take 500-1,000 mg by mouth every 6 (six) hours as needed for mild pain or headache.   Yes [provider]  valsartan (DIOVAN) 320 MG tablet Take 160 mg by mouth daily.   Yes [provider]  umeclidinium-vilanterol (ANORO ELLIPTA) 62.5-25 MCG/ACT AEPB Inhale 1 puff into the lungs daily. Patient not taking: Reported on 08/19/2022 10/22/21   Collene Gobble, MD    Physical Exam: BP 114/60   Pulse (!) 109   Temp 98.5 F (36.9 C) (Axillary)   Resp (!) 34   Ht 5\' 4"  (1.626 m)   Wt 107.1 kg   SpO2 94%   BMI 40.53 kg/m   General: 67 y.o. year-old male well developed well nourished on BiPAP, but in in no acute distress.  Alert and oriented x3. HEENT: NCAT, EOMI Neck: Supple, trachea medial Cardiovascular: Tachycardia, irregular rate and rhythm with no rubs or gallops.  No thyromegaly or JVD noted.  No lower extremity edema. 2/4 pulses in all 4 extremities. Respiratory: Tachypnea.  Scattered rhonchi on auscultation.  Abdomen: Soft, nontender  nondistended with normal bowel sounds x4 quadrants. Muskuloskeletal: No cyanosis, clubbing or edema noted bilaterally Neuro: CN II-XII intact, strength 5/5 x 4, sensation, reflexes intact Skin: No ulcerative lesions noted or rashes Psychiatry: Judgement and insight appear normal. Mood is appropriate for condition and setting          Labs on Admission:  Basic Metabolic Panel: Recent Labs  Lab 09/16/22 0517 09/22/22 1907 09/22/22 1908  NA 135  --  131*  K 4.1  --  4.0  CL 99  --  99  CO2 26  --  21*  GLUCOSE 159*  --  162*  BUN 23  --  16  CREATININE 1.05  --  1.05  CALCIUM 8.8*  --  8.3*  MG 2.4 2.1  --    Liver Function Tests: No results for input(s): "AST", "ALT", "ALKPHOS", "BILITOT", "PROT", "ALBUMIN" in the last 168 hours. No results for input(s): "LIPASE", "AMYLASE" in the last 168 hours. No results for input(s): "AMMONIA" in the last 168 hours. CBC: Recent Labs  Lab 09/16/22 0517 09/22/22 1908  WBC 9.1 12.3*  HGB 12.7* 11.1*  HCT 38.0* 32.9*  MCV 92.5 91.6  PLT 66* 219   Cardiac Enzymes: No results for input(s): "CKTOTAL", "CKMB", "CKMBINDEX", "TROPONINI" in the last 168 hours.  BNP (last 3 results) Recent Labs    09/15/22 0502 09/22/22 1909  BNP 220.9* 333.0*    ProBNP (last 3 results) No results for input(s): "PROBNP" in the last 8760 hours.  CBG: Recent Labs  Lab 09/16/22 0729 09/16/22 1139  GLUCAP 178* 175*    Radiological Exams on Admission: DG Chest Port 1 View  Result Date: 09/22/2022 CLINICAL DATA:  Shortness of breath and chest pain EXAM: PORTABLE CHEST 1 VIEW COMPARISON:  CT and x-ray 09/15/2022.  Older exams as well. FINDINGS: Extensive interstitial changes again identified, similar to previous. No pneumothorax, effusion or consolidation. Stable cardiopericardial silhouette. Overlapping cardiac leads. Surgical clips overlie the upper right hemithorax. Degenerative changes along the spine and shoulders. IMPRESSION: No significant  interval change. Stable diffuse interstitial changes single lungs, possibly fibrosis. Please correlate with prior CT Electronically Signed   By: Jill Side M.D.   On: 09/22/2022 19:21    EKG: I independently viewed the EKG done and my findings are as followed: Atrial fibrillation with rate control  Assessment/Plan Present  on Admission:  Acute respiratory failure with hypoxia (HCC)  Thrombocytopenia (HCC)  Malignant neoplasm of right upper lobe of lung (HCC)  GERD  Essential hypertension  Atrial fibrillation with RVR (HCC)  COPD (chronic obstructive pulmonary disease) (HCC)  Obesity, Class III, BMI 40-49.9 (morbid obesity) ([place])  Principal Problem:   Atrial fibrillation with RVR (HCC) Active Problems:   Essential hypertension   GERD   COPD (chronic obstructive pulmonary disease) (HCC)   Obesity, Class III, BMI 40-49.9 (morbid obesity) ([place])   Malignant neoplasm of right upper lobe of lung (HCC)   Thrombocytopenia (HCC)   Acute respiratory failure with hypoxia (HCC)   Leukocytosis   Hyponatremia   Elevated brain natriuretic peptide (BNP) level   Type 2 diabetes mellitus with hyperglycemia (HCC)   Mixed hyperlipidemia  Atrial fibrillation with RVR Continue Cardizem drip Continue telemetry Cardiology will be consulted and we shall await further recommendation  Acute respiratory failure with hypoxia in the setting of above Continue BiPAP with plan to wean patient off and transition to supplemental oxygen to maintain O2 sat > 92% and with ultimate goal to wean patient off oxygen (patient does not use oxygen at baseline  Elevated BNP BNP 333 (this was 220.9 about 8 days ago) Echocardiogram done on 12/26 showed LVEF of 55 to 60%.  No RWMA.  Mild LVH.  LV diastolic parameters indeterminate. Continue total input/output, daily weights and fluid restriction EKG and Echocardiogram in the morning  Cardiology was consulted and we shall await further recommendation  Leukocytosis  possibly reactive WBC 12.3, continue to monitor WBC with morning labs  Type 2 diabetes mellitus with hyperglycemia Hemoglobin A1c about 8 days ago was 7.1 Continue Semglee 5 units nightly and adjust dose accordingly Continue ISS and hypoglycemia Metformin will be held at this time  Hyponatremia Sodium 131, this is possibly secondary to diuretic effect Continue to monitor sodium levels  Essential hypertension Patient is currently on Cardizem drip Hold further home meds at this time  Mixed hyperlipidemia Continue Crestor  COPD Continue Xopenex  GERD Continue Protonix  CAD Continue Plavix, Crestor  Goals of care: Palliative care will be consulted  Class III obesity BMI 40.53; continue lifestyle and diet modification  Lung cancer Metastatic stage IV NSCLC on carboplatin Taxol Libtayo X1 cycle status post XRT 2023 (destructive lesion right rib) + recurrence multifocal thoracic mets follows with Dr. Earlie Server (recent OV 09/08/2022)   DVT prophylaxis: Eliquis  Code Status: Full code  Family Communication: Wife at bedside (questions answered to satisfaction)  Consults: Cardiology, palliative care  Severity of Illness: The appropriate patient status for this patient is OBSERVATION. Observation status is judged to be reasonable and necessary in order to provide the required intensity of service to ensure the patient's safety. The patient's presenting symptoms, physical exam findings, and initial radiographic and laboratory data in the context of their medical condition is felt to place them at decreased risk for further clinical deterioration. Furthermore, it is anticipated that the patient will be medically stable for discharge from the hospital within 2 midnights of admission.   Author: Bernadette Hoit, DO 09/23/2022 4:13 AM  For on call review [email or web address].

## 2022-09-22 NOTE — ED Triage Notes (Addendum)
Pt is here for sob and CP which has been getting worse over the last couple of days. Pt spo2 in mid 70's when checked on RA in triage.  Placed pt on NRB 15L

## 2022-09-22 NOTE — ED Notes (Signed)
Dr. Melina Copa request BiPAP as pt is on NRB, RT called at this time, they will place pt on BiPAP

## 2022-09-22 NOTE — Progress Notes (Deleted)
Cardiology Office Note    Date:  09/22/2022   ID:  Daniel Ball, DOB 06-06-1956, MRN [id number]  PCP:  Laurann Montana, MD  Cardiologist:  Dietrich Pates, MD  Electrophysiologist:  None   Chief Complaint: ***  History of Present Illness:   Daniel Ball is a 67 y.o. male with history of CAD s/p atherectomy/DES to LAD 12/2019, mild LV dysfunction by cath 10/2019 with normal LVEF on echo soon after, paroxysmal atrial fibrillation, COPD, DM, GERD, OSA on CPAP, HTN, HLD, ongoing tobacco abuse, NSCLC s/p XRT with more recent recurrent lung CA with multifocal thoracic metastatic disease dx 07/2022 (SCC), recent issues with thrombocytopenia, spinal stenosis who is seen for post-hospital follow-up.  Daniel Ball previously followed with Dr. Donnie Aho then Dr. Tenny Craw and Dr. Mayford Knife. He had a remote cath in 10/2019 with at least moderate multivessel CAD but negative DFR for which medical therapy was recommended - EF was 45-50% at that time by cath. Prior to DC he went into afib that was rate controlled so Eliquis was added. Echo 11/17/19 showed EF 60-65%, mild LVH, normal RV, mild aortic sclerosis without stenosis. F/u monitor showed paroxysmal afib with 30% burden, mostly rate controlled. In follow-up he continued to have dyspnea and underwent subsequent DES to LAD in 12/2019. His SOB persisted despite PCI therefore long acting nitrate was discontinued. He saw pulmonology, Dr. Sherene Sires, for COPD. He was later dx with lung CA in 08/2020 and underwent XRT. In last OP follow-up 12/2021 he was maintaining NSR. More recently he was seen back by oncology due to abnormal repeat scan with new pulm nodules and rib destruction, found to have pathology c/w squamous cell carcinoma/multifocal thoracic metastatic disease. He also had had a recent positive Cologuard (no overt bleeding seen) and was pending OP EGD/colonoscopy though he states he and his wife decided that with his progressive cancer, they did not want to pursue this. He  was started palliative radiation to the ribs and began chemotherapy with carboplatin, Taxol, and Libtayo with Neulasta support. He presented to [facility] in 08/2022 with complaints of SOB, loss of appetite, and inability to sleep. He denies any specific myalgias, just difficulty "getting comfortable." His SOB was worse with exertion, not specifically with recumbency. No new edema. He had a brief episode of focal transient CP around 3am for which he took 1 SL NTG with relief. He was found to be back in rapid atrial fibrillation. Labs were notable for progressive decline in platelets and hypomagnesemia requiring repletion. Troponis were negative. 2D echo showed EF 55-60%, mild LVH, small pericardial effusion without mention of cardiac tamponade. He remained on Plavix and Eliquis with Dr.Nahser deferring continuation to oncology. Per IM note, "I discussed with Dr. Arbutus Ped who recommended resuming them unless he had bleeding or platelets below 50 and he will make sure that this is followed."  TCP felt immune mediated. Diltiazem was added.   Hgb, plt grounding Plavix-d/w ross Recheck labs   Paroxysmal atrial fibrillation Small pericardial effusion CAD, HLD Essential HTN Hypomagnsemia, thrombocytopenia    Labwork independently reviewed: 08/2022 Mg 2.4, K 4.1, Cr 1.05, A1C 7.1, TSH OK, LFTS ok, troponins negative, Hgb 12.7, plt 66k  Cardiology Studies:   Studies reviewed are outlined and summarized above. Reports may be included below. Otherwise see EMR.  2d echo 09/15/22   1. Left ventricular ejection fraction, by estimation, is 55 to 60%. The  left ventricle has normal function. The left ventricle has no regional  wall motion abnormalities. There  is mild left ventricular hypertrophy.  Left ventricular diastolic parameters  are indeterminate.   2. Right ventricular systolic function is normal. The right ventricular  size is normal. Tricuspid regurgitation signal is inadequate for assessing  PA  pressure.   3. A small pericardial effusion is present.   4. The mitral valve was not well visualized. No evidence of mitral valve  regurgitation. No evidence of mitral stenosis.   5. The aortic valve is grossly normal. There is mild thickening of the  aortic valve. Aortic valve regurgitation is not visualized. No aortic  stenosis is present.      Past Medical History:  Diagnosis Date   Arthritis    Atrial fibrillation (HCC) 11/02/2019   COPD (chronic obstructive pulmonary disease) (HCC)    Coronary artery disease    a. 12/2019: s/p orbital atherectomy and DES placement to proximal/mid LAD.    Diabetes mellitus without complication (HCC)    GERD (gastroesophageal reflux disease)    History of radiation therapy 09/11/20, 09/16/21, 09/18/20    Right lung- SBRT Dr. Roselind Messier    Hyperlipidemia    Hypertension    hx HBP - MEDS DC'D 10 YRS since BP has been in normal range   Impingement syndrome of shoulder    left   Non-small cell cancer of right lung (HCC)    Shortness of breath    with exertion   Sleep apnea    uses a cpap   Spinal stenosis     Past Surgical History:  Procedure Laterality Date   BRONCHIAL BIOPSY  04/09/2020   Procedure: BRONCHIAL BIOPSIES;  Surgeon: Leslye Peer, MD;  [facility] ENDOSCOPY;  Service: Pulmonary;;   BRONCHIAL BIOPSY  10/20/2021   Procedure: BRONCHIAL BIOPSIES;  Surgeon: Leslye Peer, MD;  Location: [facility] ENDOSCOPY;  Service: Pulmonary;;   BRONCHIAL BRUSHINGS  04/09/2020   Procedure: BRONCHIAL BRUSHINGS;  Surgeon: Leslye Peer, MD;  Location: [facility] ENDOSCOPY;  Service: Pulmonary;;   BRONCHIAL BRUSHINGS  07/30/2020   Procedure: BRONCHIAL BRUSHINGS;  Surgeon: Leslye Peer, MD;  Location: [facility] ENDOSCOPY;  Service: Pulmonary;;   BRONCHIAL BRUSHINGS  10/20/2021   Procedure: BRONCHIAL BRUSHINGS;  Surgeon: Leslye Peer, MD;  Location: [facility] ENDOSCOPY;  Service: Pulmonary;;   BRONCHIAL NEEDLE ASPIRATION BIOPSY  04/09/2020   Procedure: BRONCHIAL NEEDLE  ASPIRATION BIOPSIES;  Surgeon: Leslye Peer, MD;  Location: [facility] ENDOSCOPY;  Service: Pulmonary;;   BRONCHIAL NEEDLE ASPIRATION BIOPSY  07/30/2020   Procedure: BRONCHIAL NEEDLE ASPIRATION BIOPSIES;  Surgeon: Leslye Peer, MD;  Location: [facility] ENDOSCOPY;  Service: Pulmonary;;   BRONCHIAL NEEDLE ASPIRATION BIOPSY  10/20/2021   Procedure: BRONCHIAL NEEDLE ASPIRATION BIOPSIES;  Surgeon: Leslye Peer, MD;  Location: [facility] ENDOSCOPY;  Service: Pulmonary;;   BRONCHIAL WASHINGS  04/09/2020   Procedure: BRONCHIAL WASHINGS;  Surgeon: Leslye Peer, MD;  Location: [facility] ENDOSCOPY;  Service: Pulmonary;;   BRONCHIAL WASHINGS  07/30/2020   Procedure: BRONCHIAL WASHINGS;  Surgeon: Leslye Peer, MD;  Location: [facility] ENDOSCOPY;  Service: Pulmonary;;   BRONCHIAL WASHINGS  10/20/2021   Procedure: BRONCHIAL WASHINGS;  Surgeon: Leslye Peer, MD;  Location: [facility] ENDOSCOPY;  Service: Pulmonary;;   CORONARY ATHERECTOMY N/A 01/18/2020   Procedure: CORONARY ATHERECTOMY;  Surgeon: Yvonne Kendall, MD;  Location: [facility] INVASIVE CV LAB;  Service: Cardiovascular;  Laterality: N/A;   CORONARY STENT INTERVENTION  01/18/2020   CORONARY STENT INTERVENTION N/A 01/18/2020   Procedure: CORONARY STENT INTERVENTION;  Surgeon: Yvonne Kendall, MD;  Location: [facility] INVASIVE CV LAB;  Service: Cardiovascular;  Laterality: N/A;   EYE SURGERY Left    FIDUCIAL MARKER PLACEMENT  04/09/2020   Procedure: FIDUCIAL MARKER PLACEMENT;  Surgeon: Leslye Peer, MD;  Location: [facility] ENDOSCOPY;  Service: Pulmonary;;   FIDUCIAL MARKER PLACEMENT  10/20/2021   Procedure: FIDUCIAL MARKER PLACEMENT;  Surgeon: Leslye Peer, MD;  Location: [facility] ENDOSCOPY;  Service: Pulmonary;;   FINE NEEDLE ASPIRATION  04/09/2020   Procedure: FINE NEEDLE ASPIRATION (FNA) LINEAR;  Surgeon: Leslye Peer, MD;  Location: [facility] ENDOSCOPY;  Service: Pulmonary;;   HAND SURGERY Right    thumb   INTRAVASCULAR PRESSURE WIRE/FFR STUDY N/A 11/02/2019   Procedure: INTRAVASCULAR PRESSURE WIRE/FFR  STUDY;  Surgeon: Yvonne Kendall, MD;  Location: [facility] INVASIVE CV LAB;  Service: Cardiovascular;  Laterality: N/A;   INTRAVASCULAR ULTRASOUND/IVUS N/A 01/18/2020   Procedure: Intravascular Ultrasound/IVUS;  Surgeon: Yvonne Kendall, MD;  Location: [facility] INVASIVE CV LAB;  Service: Cardiovascular;  Laterality: N/A;   LUMBAR LAMINECTOMY/DECOMPRESSION MICRODISCECTOMY N/A 02/07/2014   Procedure: LUMBAR DECOMPRESSION Lumbar one-Lumbar five;  Surgeon: Javier Docker, MD;  Location: [facility] ORS;  Service: Orthopedics;  Laterality: N/A;   RIGHT/LEFT HEART CATH AND CORONARY ANGIOGRAPHY N/A 11/02/2019   Procedure: RIGHT/LEFT HEART CATH AND CORONARY ANGIOGRAPHY;  Surgeon: Yvonne Kendall, MD;  Location: [facility] INVASIVE CV LAB;  Service: Cardiovascular;  Laterality: N/A;   SHOULDER ARTHROSCOPY WITH SUBACROMIAL DECOMPRESSION Left 04/12/2014   Procedure: LEFT SHOULDER ARTHROSCOPY WITH SUBACROMIAL DECOMPRESSION AND LABRAL DEBRIDEMENT, ROTATOR CUFF DEBRIDEMENT, BICEPS DEBRIDEMENT;  Surgeon: Javier Docker, MD;  Location: [facility] ORS;  Service: Orthopedics;  Laterality: Left;   VIDEO BRONCHOSCOPY WITH ENDOBRONCHIAL NAVIGATION N/A 04/09/2020   Procedure: VIDEO BRONCHOSCOPY WITH ENDOBRONCHIAL NAVIGATION;  Surgeon: Leslye Peer, MD;  Location: [facility] ENDOSCOPY;  Service: Pulmonary;  Laterality: N/A;   VIDEO BRONCHOSCOPY WITH ENDOBRONCHIAL NAVIGATION Right 07/30/2020   Procedure: VIDEO BRONCHOSCOPY WITH ENDOBRONCHIAL NAVIGATION;  Surgeon: Leslye Peer, MD;  Location: [facility] ENDOSCOPY;  Service: Pulmonary;  Laterality: Right;   VIDEO BRONCHOSCOPY WITH ENDOBRONCHIAL ULTRASOUND N/A 04/09/2020   Procedure: VIDEO BRONCHOSCOPY WITH ENDOBRONCHIAL ULTRASOUND;  Surgeon: Leslye Peer, MD;  Location: [facility] ENDOSCOPY;  Service: Pulmonary;  Laterality: N/A;   VIDEO BRONCHOSCOPY WITH RADIAL ENDOBRONCHIAL ULTRASOUND  04/09/2020   Procedure: VIDEO BRONCHOSCOPY WITH RADIAL ENDOBRONCHIAL ULTRASOUND;  Surgeon: Leslye Peer, MD;  Location: [facility] ENDOSCOPY;   Service: Pulmonary;;   VIDEO BRONCHOSCOPY WITH RADIAL ENDOBRONCHIAL ULTRASOUND  10/20/2021   Procedure: VIDEO BRONCHOSCOPY WITH RADIAL ENDOBRONCHIAL ULTRASOUND;  Surgeon: Leslye Peer, MD;  Location: [facility] ENDOSCOPY;  Service: Pulmonary;;    Current Medications: No outpatient medications have been marked as taking for the 09/23/22 encounter (Appointment) with Laurann Montana, PA-C.   ***   Allergies:   Patient has no known allergies.   Social History   Socioeconomic History   Marital status: Married    Spouse name: Not on file   Number of children: Not on file   Years of education: Not on file   Highest education level: Not on file  Occupational History   Occupation: Pipefitter  Tobacco Use   Smoking status: Every Day    Packs/day: 0.50    Years: 54.00    Total pack years: 27.00    Types: Cigarettes   Smokeless tobacco: Never   Tobacco comments:    10 cigarettes a day ARJ 11/04/21  Vaping Use   Vaping Use: Never used  Substance and Sexual Activity   Alcohol use: No   Drug use: No   Sexual activity: Not  on file  Other Topics Concern   Not on file  Social History Narrative   Not on file   Social Determinants of Health   Financial Resource Strain: Not on file  Food Insecurity: No Food Insecurity (09/15/2022)   Hunger Vital Sign    Worried About Running Out of Food in the Last Year: Never true    Ran Out of Food in the Last Year: Never true  Transportation Needs: No Transportation Needs (09/15/2022)   PRAPARE - Administrator, Civil Service (Medical): No    Lack of Transportation (Non-Medical): No  Physical Activity: Not on file  Stress: Not on file  Social Connections: Not on file     Family History:  The patient's ***Family history is unknown by patient.  ROS:   Please see the history of present illness. Otherwise, review of systems is positive for ***.  All other systems are reviewed and otherwise negative.    EKG(s)/Additional Labs   EKG:  EKG  is ordered today, personally reviewed, demonstrating ***  Recent Labs: 09/15/2022: ALT 43; B Natriuretic Peptide 220.9; TSH 1.511 09/16/2022: BUN 23; Creatinine, Ser 1.05; Hemoglobin 12.7; Magnesium 2.4; Platelets 66; Potassium 4.1; Sodium 135  Recent Lipid Panel    Component Value Date/Time   CHOL 99 01/31/2021 0830   TRIG 89 01/31/2021 0830   HDL 40 (L) 01/31/2021 0830   CHOLHDL 2.5 01/31/2021 0830   VLDL 18 01/31/2021 0830   LDLCALC 41 01/31/2021 0830    PHYSICAL EXAM:    VS:  There were no vitals taken for this visit.  BMI: There is no height or weight on file to calculate BMI.  GEN: Well nourished, well developed male in no acute distress HEENT: normocephalic, atraumatic Neck: no JVD, carotid bruits, or masses Cardiac: ***RRR; no murmurs, rubs, or gallops, no edema  Respiratory:  clear to auscultation bilaterally, normal work of breathing GI: soft, nontender, nondistended, + BS MS: no deformity or atrophy Skin: warm and dry, no rash Neuro:  Alert and Oriented x 3, Strength and sensation are intact, follows commands Psych: euthymic mood, full affect  Wt Readings from Last 3 Encounters:  09/15/22 234 lb (106.1 kg)  09/08/22 244 lb 6.4 oz (110.9 kg)  09/02/22 249 lb 8 oz (113.2 kg)     ASSESSMENT & PLAN:   ***     Disposition: F/u with ***   Medication Adjustments/Labs and Tests Ordered: Current medicines are reviewed at length with the patient today.  Concerns regarding medicines are outlined above. Medication changes, Labs and Tests ordered today are summarized above and listed in the Patient Instructions accessible in Encounters.   Signed, Laurann Montana, PA-C  09/22/2022 10:02 AM    [facility] Phone: [phone]; Fax: [phone]

## 2022-09-22 NOTE — Telephone Encounter (Signed)
Spoke with patient and his wife by phone. Patient has been experiencing increase SOB with any activity. It does improve with rest. Patient is not currently on oxygen. Wife states she has a pulse ox and is getting readings in the 70's. Patient denies CP. Advised that patient needs to be assessed in person due to symptoms and they should go to the nearest ED or call EMS for urgent evaluation.   Patient and his wife agreed to this plan and will go to the ED.   Patient is schedule to see Melina Copa PA on 09/23/22.

## 2022-09-22 NOTE — Telephone Encounter (Signed)
   Pt c/o medication issue:  1. Name of Medication:   diltiazem (CARDIZEM CD) 180 MG 24 hr capsule    2. How are you currently taking this medication (dosage and times per day)?   Take 1 capsule (180 mg total) by mouth daily.    3. Are you having a reaction (difficulty breathing--STAT)? No   4. What is your medication issue? Pt's daughter calling, she said, pt is having low O2 level and would like to ask if she can give an extra diltiazem to the pt

## 2022-09-23 ENCOUNTER — Ambulatory Visit: Payer: Medicare Other | Admitting: Physician Assistant

## 2022-09-23 ENCOUNTER — Telehealth: Payer: Self-pay | Admitting: Medical Oncology

## 2022-09-23 ENCOUNTER — Ambulatory Visit: Payer: Medicare Other | Admitting: Internal Medicine

## 2022-09-23 ENCOUNTER — Ambulatory Visit: Payer: Medicare Other

## 2022-09-23 ENCOUNTER — Encounter: Payer: Self-pay | Admitting: Internal Medicine

## 2022-09-23 ENCOUNTER — Other Ambulatory Visit: Payer: Medicare Other

## 2022-09-23 ENCOUNTER — Observation Stay (HOSPITAL_COMMUNITY): Payer: Medicare Other

## 2022-09-23 DIAGNOSIS — J9600 Acute respiratory failure, unspecified whether with hypoxia or hypercapnia: Secondary | ICD-10-CM | POA: Diagnosis present

## 2022-09-23 DIAGNOSIS — J849 Interstitial pulmonary disease, unspecified: Secondary | ICD-10-CM | POA: Diagnosis not present

## 2022-09-23 DIAGNOSIS — R451 Restlessness and agitation: Secondary | ICD-10-CM | POA: Diagnosis not present

## 2022-09-23 DIAGNOSIS — E1165 Type 2 diabetes mellitus with hyperglycemia: Secondary | ICD-10-CM | POA: Diagnosis present

## 2022-09-23 DIAGNOSIS — Z7189 Other specified counseling: Secondary | ICD-10-CM | POA: Diagnosis not present

## 2022-09-23 DIAGNOSIS — I4819 Other persistent atrial fibrillation: Secondary | ICD-10-CM | POA: Diagnosis present

## 2022-09-23 DIAGNOSIS — C3490 Malignant neoplasm of unspecified part of unspecified bronchus or lung: Secondary | ICD-10-CM | POA: Diagnosis not present

## 2022-09-23 DIAGNOSIS — R7989 Other specified abnormal findings of blood chemistry: Secondary | ICD-10-CM | POA: Insufficient documentation

## 2022-09-23 DIAGNOSIS — I2510 Atherosclerotic heart disease of native coronary artery without angina pectoris: Secondary | ICD-10-CM | POA: Diagnosis present

## 2022-09-23 DIAGNOSIS — R918 Other nonspecific abnormal finding of lung field: Secondary | ICD-10-CM | POA: Diagnosis not present

## 2022-09-23 DIAGNOSIS — C3411 Malignant neoplasm of upper lobe, right bronchus or lung: Secondary | ICD-10-CM | POA: Diagnosis present

## 2022-09-23 DIAGNOSIS — Z515 Encounter for palliative care: Secondary | ICD-10-CM

## 2022-09-23 DIAGNOSIS — E782 Mixed hyperlipidemia: Secondary | ICD-10-CM | POA: Insufficient documentation

## 2022-09-23 DIAGNOSIS — I4891 Unspecified atrial fibrillation: Secondary | ICD-10-CM | POA: Diagnosis not present

## 2022-09-23 DIAGNOSIS — I3139 Other pericardial effusion (noninflammatory): Secondary | ICD-10-CM

## 2022-09-23 DIAGNOSIS — M1990 Unspecified osteoarthritis, unspecified site: Secondary | ICD-10-CM | POA: Diagnosis present

## 2022-09-23 DIAGNOSIS — E785 Hyperlipidemia, unspecified: Secondary | ICD-10-CM

## 2022-09-23 DIAGNOSIS — I509 Heart failure, unspecified: Secondary | ICD-10-CM | POA: Diagnosis present

## 2022-09-23 DIAGNOSIS — E871 Hypo-osmolality and hyponatremia: Secondary | ICD-10-CM | POA: Insufficient documentation

## 2022-09-23 DIAGNOSIS — R000 Tachycardia, unspecified: Secondary | ICD-10-CM | POA: Diagnosis not present

## 2022-09-23 DIAGNOSIS — F40240 Claustrophobia: Secondary | ICD-10-CM | POA: Diagnosis present

## 2022-09-23 DIAGNOSIS — Z6841 Body Mass Index (BMI) 40.0 and over, adult: Secondary | ICD-10-CM | POA: Diagnosis not present

## 2022-09-23 DIAGNOSIS — J9601 Acute respiratory failure with hypoxia: Secondary | ICD-10-CM | POA: Diagnosis present

## 2022-09-23 DIAGNOSIS — R911 Solitary pulmonary nodule: Secondary | ICD-10-CM | POA: Diagnosis present

## 2022-09-23 DIAGNOSIS — I10 Essential (primary) hypertension: Secondary | ICD-10-CM

## 2022-09-23 DIAGNOSIS — Z66 Do not resuscitate: Secondary | ICD-10-CM

## 2022-09-23 DIAGNOSIS — R0602 Shortness of breath: Secondary | ICD-10-CM | POA: Diagnosis not present

## 2022-09-23 DIAGNOSIS — I110 Hypertensive heart disease with heart failure: Secondary | ICD-10-CM | POA: Diagnosis present

## 2022-09-23 DIAGNOSIS — J8489 Other specified interstitial pulmonary diseases: Secondary | ICD-10-CM | POA: Diagnosis present

## 2022-09-23 DIAGNOSIS — I480 Paroxysmal atrial fibrillation: Secondary | ICD-10-CM

## 2022-09-23 DIAGNOSIS — C7989 Secondary malignant neoplasm of other specified sites: Secondary | ICD-10-CM | POA: Diagnosis present

## 2022-09-23 DIAGNOSIS — D630 Anemia in neoplastic disease: Secondary | ICD-10-CM | POA: Diagnosis present

## 2022-09-23 DIAGNOSIS — D72829 Elevated white blood cell count, unspecified: Secondary | ICD-10-CM | POA: Diagnosis present

## 2022-09-23 DIAGNOSIS — I25118 Atherosclerotic heart disease of native coronary artery with other forms of angina pectoris: Secondary | ICD-10-CM | POA: Diagnosis not present

## 2022-09-23 DIAGNOSIS — I200 Unstable angina: Secondary | ICD-10-CM | POA: Diagnosis not present

## 2022-09-23 DIAGNOSIS — D696 Thrombocytopenia, unspecified: Secondary | ICD-10-CM | POA: Diagnosis present

## 2022-09-23 DIAGNOSIS — G4733 Obstructive sleep apnea (adult) (pediatric): Secondary | ICD-10-CM | POA: Diagnosis not present

## 2022-09-23 DIAGNOSIS — Z1152 Encounter for screening for COVID-19: Secondary | ICD-10-CM | POA: Diagnosis not present

## 2022-09-23 DIAGNOSIS — J449 Chronic obstructive pulmonary disease, unspecified: Secondary | ICD-10-CM | POA: Diagnosis present

## 2022-09-23 LAB — GLUCOSE, CAPILLARY
Glucose-Capillary: 219 mg/dL — ABNORMAL HIGH (ref 70–99)
Glucose-Capillary: 161 mg/dL — ABNORMAL HIGH (ref 70–99)
Glucose-Capillary: 226 mg/dL — ABNORMAL HIGH (ref 70–99)
Glucose-Capillary: 244 mg/dL — ABNORMAL HIGH (ref 70–99)

## 2022-09-23 LAB — BLOOD GAS, ARTERIAL
Acid-Base Excess: 3.4 mmol/L — ABNORMAL HIGH (ref 0.0–2.0)
Bicarbonate: 26.8 mmol/L (ref 20.0–28.0)
Drawn by: 23430
FIO2: 100 %
O2 Saturation: 99.2 %
Patient temperature: 36.5
pCO2 arterial: 35 mmHg (ref 32–48)
pH, Arterial: 7.49 — ABNORMAL HIGH (ref 7.35–7.45)
pO2, Arterial: 136 mmHg — ABNORMAL HIGH (ref 83–108)

## 2022-09-23 LAB — CBC
HCT: 30.6 % — ABNORMAL LOW (ref 39.0–52.0)
Hemoglobin: 10.4 g/dL — ABNORMAL LOW (ref 13.0–17.0)
MCH: 31 pg (ref 26.0–34.0)
MCHC: 34 g/dL (ref 30.0–36.0)
MCV: 91.3 fL (ref 80.0–100.0)
Platelets: 210 10*3/uL (ref 150–400)
RBC: 3.35 MIL/uL — ABNORMAL LOW (ref 4.22–5.81)
RDW: 15.6 % — ABNORMAL HIGH (ref 11.5–15.5)
WBC: 13.2 10*3/uL — ABNORMAL HIGH (ref 4.0–10.5)
nRBC: 0 % (ref 0.0–0.2)

## 2022-09-23 LAB — COMPREHENSIVE METABOLIC PANEL
ALT: 33 U/L (ref 0–44)
AST: 35 U/L (ref 15–41)
Albumin: 2.8 g/dL — ABNORMAL LOW (ref 3.5–5.0)
Alkaline Phosphatase: 126 U/L (ref 38–126)
Anion gap: 11 (ref 5–15)
BUN: 22 mg/dL (ref 8–23)
CO2: 22 mmol/L (ref 22–32)
Calcium: 8.3 mg/dL — ABNORMAL LOW (ref 8.9–10.3)
Chloride: 98 mmol/L (ref 98–111)
Creatinine, Ser: 1.07 mg/dL (ref 0.61–1.24)
GFR, Estimated: >60 mL/min (ref >60)
Glucose, Bld: 204 mg/dL — ABNORMAL HIGH (ref 70–99)
Potassium: 4 mmol/L (ref 3.5–5.1)
Sodium: 131 mmol/L — ABNORMAL LOW (ref 135–145)
Total Bilirubin: 0.9 mg/dL (ref 0.3–1.2)
Total Protein: 7.2 g/dL (ref 6.5–8.1)

## 2022-09-23 LAB — PROCALCITONIN: Procalcitonin: 0.26 ng/mL

## 2022-09-23 LAB — MAGNESIUM: Magnesium: 2.3 mg/dL (ref 1.7–2.4)

## 2022-09-23 LAB — PHOSPHORUS: Phosphorus: 4.1 mg/dL (ref 2.5–4.6)

## 2022-09-23 LAB — MRSA NEXT GEN BY PCR, NASAL: MRSA by PCR Next Gen: NOT DETECTED

## 2022-09-23 LAB — SEDIMENTATION RATE: Sed Rate: 128 mm/hr — ABNORMAL HIGH (ref 0–16)

## 2022-09-23 MED ORDER — ROSUVASTATIN CALCIUM 20 MG PO TABS
40.0000 mg | ORAL_TABLET | Freq: Every day | ORAL | Status: DC
  Administered 2022-09-23 – 2022-09-25 (×3): 40 mg via ORAL
  Filled 2022-09-23 (×3): qty 2

## 2022-09-23 MED ORDER — HYDROXYZINE HCL 10 MG PO TABS
10.0000 mg | ORAL_TABLET | Freq: Three times a day (TID) | ORAL | Status: DC | PRN
  Administered 2022-09-23 (×2): 10 mg via ORAL
  Filled 2022-09-23 (×2): qty 1

## 2022-09-23 MED ORDER — LEVALBUTEROL HCL 0.63 MG/3ML IN NEBU
INHALATION_SOLUTION | RESPIRATORY_TRACT | Status: AC
  Administered 2022-09-23: 0.63 mg
  Filled 2022-09-23: qty 3

## 2022-09-23 MED ORDER — IPRATROPIUM BROMIDE 0.02 % IN SOLN
0.5000 mg | Freq: Three times a day (TID) | RESPIRATORY_TRACT | Status: DC
  Administered 2022-09-23 – 2022-09-25 (×8): 0.5 mg via RESPIRATORY_TRACT
  Filled 2022-09-23 (×8): qty 2.5

## 2022-09-23 MED ORDER — LEVALBUTEROL HCL 0.63 MG/3ML IN NEBU
0.6300 mg | INHALATION_SOLUTION | Freq: Three times a day (TID) | RESPIRATORY_TRACT | Status: DC
  Administered 2022-09-23 – 2022-09-25 (×8): 0.63 mg via RESPIRATORY_TRACT
  Filled 2022-09-23 (×8): qty 3

## 2022-09-23 MED ORDER — ALPRAZOLAM 0.25 MG PO TABS: 0.2500 mg | ORAL_TABLET | Freq: Two times a day (BID) | ORAL | Status: DC | PRN

## 2022-09-23 MED ORDER — HYDROMORPHONE HCL 1 MG/ML IJ SOLN: 1.0000 mg | INTRAMUSCULAR | Status: DC | PRN

## 2022-09-23 MED ORDER — BUDESONIDE 0.25 MG/2ML IN SUSP
0.2500 mg | Freq: Two times a day (BID) | RESPIRATORY_TRACT | Status: DC
  Administered 2022-09-23 – 2022-09-25 (×5): 0.25 mg via RESPIRATORY_TRACT
  Filled 2022-09-23 (×5): qty 2

## 2022-09-23 MED ORDER — APIXABAN 5 MG PO TABS
5.0000 mg | ORAL_TABLET | Freq: Two times a day (BID) | ORAL | Status: DC
  Administered 2022-09-23 – 2022-09-25 (×5): 5 mg via ORAL
  Filled 2022-09-23 (×5): qty 1

## 2022-09-23 MED ORDER — GUAIFENESIN 100 MG/5ML PO LIQD
5.0000 mL | ORAL | Status: DC | PRN
  Administered 2022-09-23 – 2022-09-24 (×2): 5 mL via ORAL
  Filled 2022-09-23 (×3): qty 5

## 2022-09-23 MED ORDER — INSULIN GLARGINE-YFGN 100 UNIT/ML ~~LOC~~ SOLN
5.0000 [IU] | Freq: Every day | SUBCUTANEOUS | Status: DC
  Administered 2022-09-23: 5 [IU] via SUBCUTANEOUS
  Filled 2022-09-23: qty 0.05

## 2022-09-23 MED ORDER — IPRATROPIUM BROMIDE 0.02 % IN SOLN
RESPIRATORY_TRACT | Status: AC
  Administered 2022-09-23: 0.5 mg
  Filled 2022-09-23: qty 2.5

## 2022-09-23 MED ORDER — PANTOPRAZOLE SODIUM 40 MG PO TBEC
40.0000 mg | DELAYED_RELEASE_TABLET | Freq: Two times a day (BID) | ORAL | Status: DC
  Administered 2022-09-23 – 2022-09-25 (×4): 40 mg via ORAL
  Filled 2022-09-23 (×4): qty 1

## 2022-09-23 MED ORDER — MORPHINE SULFATE (PF) 2 MG/ML IV SOLN
2.0000 mg | INTRAVENOUS | Status: DC | PRN
  Administered 2022-09-23 – 2022-09-24 (×4): 2 mg via INTRAVENOUS
  Filled 2022-09-23 (×4): qty 1

## 2022-09-23 MED ORDER — ALPRAZOLAM 0.25 MG PO TABS
0.2500 mg | ORAL_TABLET | Freq: Two times a day (BID) | ORAL | Status: DC | PRN
  Administered 2022-09-23: 0.25 mg via ORAL
  Filled 2022-09-23: qty 1

## 2022-09-23 MED ORDER — LEVALBUTEROL HCL 0.63 MG/3ML IN NEBU: 0.6300 mg | INHALATION_SOLUTION | Freq: Three times a day (TID) | RESPIRATORY_TRACT | Status: DC | PRN

## 2022-09-23 MED ORDER — METHYLPREDNISOLONE SODIUM SUCC 125 MG IJ SOLR
125.0000 mg | Freq: Two times a day (BID) | INTRAMUSCULAR | Status: DC
  Administered 2022-09-23 – 2022-09-25 (×5): 125 mg via INTRAVENOUS
  Filled 2022-09-23 (×5): qty 2

## 2022-09-23 MED ORDER — ORAL CARE MOUTH RINSE
15.0000 mL | OROMUCOSAL | Status: DC
  Administered 2022-09-23 – 2022-09-25 (×10): 15 mL via OROMUCOSAL

## 2022-09-23 MED ORDER — ALPRAZOLAM 0.5 MG PO TABS
0.5000 mg | ORAL_TABLET | Freq: Every day | ORAL | Status: DC
  Administered 2022-09-23 – 2022-09-24 (×2): 0.5 mg via ORAL
  Filled 2022-09-23 (×2): qty 1

## 2022-09-23 MED ORDER — PANTOPRAZOLE SODIUM 40 MG PO TBEC
40.0000 mg | DELAYED_RELEASE_TABLET | Freq: Every day | ORAL | Status: DC
  Administered 2022-09-23: 40 mg via ORAL
  Filled 2022-09-23: qty 1

## 2022-09-23 MED ORDER — INSULIN ASPART 100 UNIT/ML IJ SOLN
0.0000 [IU] | Freq: Three times a day (TID) | INTRAMUSCULAR | Status: DC
  Administered 2022-09-23 (×2): 5 [IU] via SUBCUTANEOUS
  Administered 2022-09-23: 3 [IU] via SUBCUTANEOUS
  Administered 2022-09-24: 5 [IU] via SUBCUTANEOUS
  Administered 2022-09-24 (×2): 8 [IU] via SUBCUTANEOUS
  Administered 2022-09-25: 11 [IU] via SUBCUTANEOUS
  Administered 2022-09-25: 8 [IU] via SUBCUTANEOUS

## 2022-09-23 MED ORDER — PROCHLORPERAZINE EDISYLATE 10 MG/2ML IJ SOLN
10.0000 mg | Freq: Four times a day (QID) | INTRAMUSCULAR | Status: DC | PRN
  Administered 2022-09-23: 10 mg via INTRAVENOUS
  Filled 2022-09-23: qty 2

## 2022-09-23 MED ORDER — CHLORHEXIDINE GLUCONATE CLOTH 2 % EX PADS
6.0000 | MEDICATED_PAD | Freq: Every day | CUTANEOUS | Status: DC
  Administered 2022-09-23 – 2022-09-24 (×2): 6 via TOPICAL

## 2022-09-23 MED ORDER — ORAL CARE MOUTH RINSE
15.0000 mL | OROMUCOSAL | Status: DC | PRN
  Administered 2022-09-23: 15 mL via OROMUCOSAL

## 2022-09-23 MED ORDER — ALPRAZOLAM 0.25 MG PO TABS: 0.2500 mg | ORAL_TABLET | Freq: Every day | ORAL | Status: DC

## 2022-09-23 MED ORDER — MORPHINE SULFATE (PF) 2 MG/ML IV SOLN
2.0000 mg | INTRAVENOUS | Status: DC | PRN
  Administered 2022-09-23 (×2): 2 mg via INTRAVENOUS
  Filled 2022-09-23 (×2): qty 1

## 2022-09-23 MED ORDER — CLOPIDOGREL BISULFATE 75 MG PO TABS
75.0000 mg | ORAL_TABLET | Freq: Every day | ORAL | Status: DC
  Administered 2022-09-23 – 2022-09-25 (×3): 75 mg via ORAL
  Filled 2022-09-23 (×3): qty 1

## 2022-09-23 NOTE — Progress Notes (Signed)
Call to provider for patient in resp distress. RR 40-50s using accessory muscles on NRB mask and sat 87%. Patient stating he cannot wear the BiPAP because he is claustrophobic. Patient not able to lay in the bed and sitting in the bedside chair. New orders received.

## 2022-09-23 NOTE — ED Notes (Signed)
RT called to assist with transport to ICU as pt is on BiPAP, pt remains on cardiac monitoring and transported by this RN

## 2022-09-23 NOTE — Consult Note (Signed)
Consultation Note Date: 09/23/2022   Patient Name: Daniel Ball  DOB: 1956/09/15  MRN: [id number]  Age / Sex: 67 y.o., male  PCP: [name], MD Referring Physician: Deatra James, MD  Reason for Consultation: Establishing goals of care  HPI/Patient Profile: 67 y.o. male  with past medical history of atrial fibrillation, CAD, hypertension, hyperlipidemia, COPD, GERD, stage IV lung cancer on chemotherapy/radiation (follows with Dr. Earlie Server; recent recurrence Nov 2023), sleep apnea uses CPAP admitted on 09/22/2022 with shortness of breath, nonproductive cough, hypoxia requiring BiPAP support complicated by atrial fibrillation RVR requiring diltiazem infusion. .   Clinical Assessment and Goals of Care: Chart reviewed inpatient and outpatient, diagnostics, and recent hospitalization. I met today with Daniel Ball and his daughter, Levada Dy, at bedside. Daniel Ball is sitting up in recliner with 15L [place] + NRB. He is alert and interactive. He is able to talk but with labored breathing. We discussed his current health status as well as his recent recurrence of lung cancer now stage 4. Daniel Ball speaks openly about his poor prognosis. He questions the reasoning to continue with treatment knowing that this is difficult on him and it will not be curable treatment. He shares that he believes in God and knows he will go to heaven when his time comes so he does not fear death. I took this opportunity to speak with him about his wishes and code status. Daniel Ball is very clear that he would not desire life support or resuscitation but he would like to speak with his wife about this when she returns this afternoon. They have had conversations but not specifically regarding DNR status. Daughter does share that she knows that he wants to die at home when his time comes.   RN comes to bedside and we discussed symptoms together.  Family express that he has had significant trouble sleeping over the weekend and some over the past week. Since this past weekend they express that once he falls asleep he will talk and/or have tremor/jerking movements and ultimately awaken himself after just a couple minutes. He is severely sleep deprived. He denies any hallucinations. He has had some improvement with morphine to help his breathing and help him rest. We discussed trial of Xanax and then with morphine if needed and tolerated to provide him some rest.   Update: I returned later when wife is at bedside. She has brought Living Will and we reviewed together. They all agree with DNR status based on Daniel Ball stated wishes and this is consistent with Living Will. Daniel Ball is now in bed and on BiPAP. He is resting some now after receiving morphine and Xanax and he is tolerating these medications well although not completely relaxed - will provide higher dose Xanax tonight to help with nighttime rest. They share that they are not interested in pursuing further radiation or treatment this week. Family expresses that they would not be surprised if Daniel Ball does not desire more treatment after this illness. We also discussed that if  he were to decline further now or in the future we will work to ensure his comfort so he does not suffer. Family agree that they do not want him to suffer. For now we will continue current treatment and allow time for outcomes.   All questions/concerns addressed. Emotional support provided.   Primary Decision Maker PATIENT    SUMMARY OF RECOMMENDATIONS   - DNR decided - Holding oncology treatment for now; may not be interested in further treatment - Time for outcomes  Code Status/Advance Care Planning: DNR   Symptom Management:  Morphine 2 mg IV every 2 hours PRN shortness of breath.  Xanax 0.25 mg BID PRN anxiety + Xanax 0.5 mg qhs.   Prognosis:  Overall prognosis poor with underlying metastatic  cancer and respiratory failure.   Discharge Planning: To Be Determined      Primary Diagnoses: Present on Admission:  Acute respiratory failure with hypoxia (HCC)  Thrombocytopenia ([place])  Malignant neoplasm of right upper lobe of lung (HCC)  GERD  Essential hypertension  Atrial fibrillation with RVR (HCC)  COPD (chronic obstructive pulmonary disease) (HCC)  Obesity, Class III, BMI 40-49.9 (morbid obesity) ([place])   I have reviewed the medical record, interviewed the patient and family, and examined the patient. The following aspects are pertinent.  Past Medical History:  Diagnosis Date   Arthritis    Atrial fibrillation ([place]) 11/02/2019   COPD (chronic obstructive pulmonary disease) (HCC)    Coronary artery disease    a. 12/2019: s/p orbital atherectomy and DES placement to proximal/mid LAD.    Diabetes mellitus without complication ([place])    GERD (gastroesophageal reflux disease)    History of radiation therapy 09/11/20, 09/16/21, 09/18/20    Right lung- SBRT Dr. Sondra Come    Hyperlipidemia    Hypertension    hx HBP - MEDS DC'D 10 YRS since BP has been in normal range   Impingement syndrome of shoulder    left   Non-small cell cancer of right lung (HCC)    Pericardial effusion    Shortness of breath    with exertion   Sleep apnea    uses a cpap   Spinal stenosis    Thrombocytopenia (HCC)    Social History   Socioeconomic History   Marital status: Married    Spouse name: Not on file   Number of children: Not on file   Years of education: Not on file   Highest education level: Not on file  Occupational History   Occupation: Pipefitter  Tobacco Use   Smoking status: Every Day    Packs/day: 0.50    Years: 54.00    Total pack years: 27.00    Types: Cigarettes   Smokeless tobacco: Never   Tobacco comments:    10 cigarettes a day ARJ 11/04/21  Vaping Use   Vaping Use: Never used  Substance and Sexual Activity   Alcohol use: No   Drug use: No   Sexual activity:  Not on file  Other Topics Concern   Not on file  Social History Narrative   Not on file   Social Determinants of Health   Financial Resource Strain: Not on file  Food Insecurity: No Food Insecurity (09/23/2022)   Hunger Vital Sign    Worried About Running Out of Food in the Last Year: Never true    Ran Out of Food in the Last Year: Never true  Transportation Needs: No Transportation Needs (09/23/2022)   PRAPARE - Transportation    Lack  of Transportation (Medical): No    Lack of Transportation (Non-Medical): No  Physical Activity: Not on file  Stress: Not on file  Social Connections: Not on file   Family History  Family history unknown: Yes   Scheduled Meds:  apixaban  5 mg Oral BID   Chlorhexidine Gluconate Cloth  6 each Topical Q0600   clopidogrel  75 mg Oral Q breakfast   insulin aspart  0-15 Units Subcutaneous TID WC   insulin glargine-yfgn  5 Units Subcutaneous QHS   ipratropium  0.5 mg Nebulization TID   levalbuterol  0.63 mg Nebulization TID   mouth rinse  15 mL Mouth Rinse 4 times per day   pantoprazole  40 mg Oral Daily   rosuvastatin  40 mg Oral Daily   Continuous Infusions:  diltiazem (CARDIZEM) infusion 15 mg/hr (09/23/22 0723)   PRN Meds:.hydrOXYzine, levalbuterol, morphine injection, mouth rinse No Known Allergies Review of Systems  Constitutional:  Positive for activity change, appetite change and fatigue.  HENT:  Positive for trouble swallowing.   Respiratory:  Positive for shortness of breath.   Neurological:  Positive for tremors and weakness.  Psychiatric/Behavioral:  Positive for sleep disturbance. The patient is nervous/anxious.     Physical Exam Vitals and nursing note reviewed.  Constitutional:      Appearance: He is ill-appearing.  Cardiovascular:     Rate and Rhythm: Normal rate. Rhythm irregularly irregular.     Comments: HR 90s Pulmonary:     Effort: Accessory muscle usage present. No tachypnea.     Comments: Labored at rest and worse  with talking or activity.  Requiring 15L [place] + 15L NRB.  Neurological:     Mental Status: He is alert and oriented to person, place, and time.     Vital Signs: BP 100/68   Pulse 90   Temp 97.7 F (36.5 C) (Oral)   Resp (!) 31   Ht _0  (1.626 m)   Wt 107.1 kg   SpO2 95%   BMI 40.53 kg/m  Pain Scale: 0-10   Pain Score: 0-No pain   SpO2: SpO2: 95 % O2 Device:SpO2: 95 % O2 Flow Rate: .O2 Flow Rate (L/min): 15 L/min  IO: Intake/output summary:  Intake/Output Summary (Last 24 hours) at 09/23/2022 0816 Last data filed at 09/23/2022 [id number] Gross per 24 hour  Intake 116 ml  Output 200 ml  Net -84 ml    LBM: Last BM Date : 09/22/22 Baseline Weight: Weight: 106.6 kg Most recent weight: Weight: 107.1 kg     Palliative Assessment/Data:     Time In: 1315  Time Total: 85 min  Greater than 50%  of this time was spent counseling and coordinating care related to the above assessment and plan.  Signed by: Vinie Sill, NP Palliative Medicine Team Pager # [phone] (M-F 8a-5p) Team Phone # [phone] (Nights/Weekends)

## 2022-09-23 NOTE — TOC Progression Note (Signed)
Transition of Care [facility]) - Progression Note    Patient Details  Name: [name] MRN: [id number] Date of Birth: 1956-01-17  Transition of Care [facility]) CM/SW Contact  Salome Arnt,  Phone Number: 09/23/2022, 3:22 PM  Clinical Narrative:   Transition of Care [facility]) Screening Note   Patient Details  Name: Daniel Ball Date of Birth: [birth year]   Transition of Care [facility]) CM/SW Contact:    Salome Arnt, LCSW Phone Number: 09/23/2022, 3:22 PM  Pt transferring to [facility] when bed available. TOC will follow there to address any needs.  Transition of Care Department [facility]) has reviewed patient and no TOC needs have been identified at this time. We will continue to monitor patient advancement through interdisciplinary progression rounds. If new patient transition needs arise, please place a TOC consult.            Expected Discharge Plan and Services                                               Social Determinants of Health (SDOH) Interventions SDOH Screenings   Food Insecurity: No Food Insecurity (09/23/2022)  Housing: Low Risk  (09/23/2022)  Transportation Needs: No Transportation Needs (09/23/2022)  Utilities: Not At Risk (09/23/2022)  Depression (PHQ2-9): Low Risk  (12/06/2020)  Tobacco Use: High Risk (09/22/2022)    Readmission Risk Interventions     No data to display

## 2022-09-23 NOTE — ED Notes (Signed)
Receiving RN Teressa Senter, has agreed to accept [facility] once pt has arrived to inpatient unit, all questions and concerns address.

## 2022-09-23 NOTE — ED Notes (Signed)
Attempted to call ICU for report, no answer at this time, will attempt to call back momentary

## 2022-09-23 NOTE — Consult Note (Signed)
NAME:  Daniel Ball, MRN:  [id number], DOB:  05/30/1956, LOS: 0 ADMISSION DATE:  09/22/2022, CONSULTATION DATE:  1/3 REFERRING MD:  [facility], CHIEF COMPLAINT:  worsening sob at rest   History of Present Illness:  50 yowm smoker with MO/ OSA and mostly retrictive lung dz suggested NSIP by HRCT in 7494 complicated now by recurrent NSCLCa and recent admit for rapid afib now back with worsening DOE and rapid afib with no acute changes on cxr and unable to keep bipap mask on due to claustropobia but does fine at home with his mask on cpap chronically  Going downhill x months with dry cough, sometimes frank aspiration but no cp, fever chills and much worse since combined RT and chemo.  No leg swelling but Pos orthopnea chronically with 30 degrees HOB at home and doe x 100 ft pushing grocery cart a week PTA s 02  PCCM asked to eval am 1/3 by [facility] while bed being sought at [facility]         Pertinent  Medical History  PRIOR THERAPY: SBRT to the right lower lobe lung nodule since 12/02/21 under the care of Dr. Sondra Come / still on RT at admit.   CURRENT THERAPY:  Systemic chemotherapy with carboplatin for an AUC of 5, Taxol 175 mg/m, and Libtayo IV every 3 weeks with neulasta support.  First dose was September 02, 2022.  Status post 1 cycle    Significant Hospital Events: Including procedures, antibiotic start and stop dates in addition to other pertinent events   Bipap trial 1/3 poorly tolerated with hosp mask> advised change to home mask and adjust IPAP to VT > 3-400    Scheduled Meds:  apixaban  5 mg Oral BID   budesonide (PULMICORT) nebulizer solution  0.25 mg Nebulization BID   Chlorhexidine Gluconate Cloth  6 each Topical Q0600   clopidogrel  75 mg Oral Q breakfast   insulin aspart  0-15 Units Subcutaneous TID WC   insulin glargine-yfgn  5 Units Subcutaneous QHS   ipratropium  0.5 mg Nebulization TID   levalbuterol  0.63 mg Nebulization TID   mouth rinse  15 mL Mouth Rinse 4 times per day    pantoprazole  40 mg Oral Daily   rosuvastatin  40 mg Oral Daily   Continuous Infusions:  diltiazem (CARDIZEM) infusion 15 mg/hr (09/23/22 0723)   PRN Meds:.hydrOXYzine, levalbuterol, morphine injection, mouth rinse    Interim History / Subjective:  Feeling better p MS x 2 mg sitting up in chair on NRM   Objective   Blood pressure 130/73, pulse (!) 106, temperature 97.7 F (36.5 C), temperature source Oral, resp. rate (!) 39, height _0  (1.626 m), weight 107.1 kg, SpO2 96 %.    FiO2 (%):  [45 %-100 %] 100 %   Intake/Output Summary (Last 24 hours) at 09/23/2022 0855 Last data filed at 09/23/2022 0723 Gross per 24 hour  Intake 116 ml  Output 200 ml  Net -84 ml   Filed Weights   09/22/22 1918 09/23/22 0100 09/23/22 0400  Weight: 106.6 kg 107.1 kg 107.1 kg    Examination: Tmax:  98.5 General appearance:    acutely ill appearing/ harsh dry cough   No jvd Oropharynx clear,  mucosa nl Neck supple Lungs with coarse insp rhonchi/ a few crackles bases/ no wheeze IRIR at 100 apically  Abd quite obese with limited excursion  Extr warm with no edema or clubbing noted Neuro  Sensorium intact ,  no apparent motor  deficits    I personally reviewed images and agree with radiology impression as follows:  CXR:   portable 820 am 21/3 1. No acute findings. 2. Post treatment scarring in the right upper lobe. Known metastatic lesions in the right hemithorax are better seen on recent cross-sectional imaging. 3. Coarsened interstitial pulmonary markings, as on 09/15/2022.      Assessment & Plan:  1) Acute (new) resp failure in setting of known NSIP / met lung ca with MPNs and now s/p RT and chemo but no evidence of opportunistic infection or obvious Ac  progression  >>> check baseline esr/ hrct  >>> try bipap prn with lower pressure setting using his mask from home >>> cancel RT and further chemo for now   2) RAF in setting of diastolic dysfunction > rx with BB/cardizem per cards /  would avoid Amiodarone here   Best Practice (right click and "Reselect all SmartList Selections" daily)    Per triad   Labs   CBC: Recent Labs  Lab 09/22/22 1908 09/23/22 0511  WBC 12.3* 13.2*  HGB 11.1* 10.4*  HCT 32.9* 30.6*  MCV 91.6 91.3  PLT 219 754    Basic Metabolic Panel: Recent Labs  Lab 09/22/22 1907 09/22/22 1908 09/23/22 0511  NA  --  131* 131*  K  --  4.0 4.0  CL  --  99 98  CO2  --  21* 22  GLUCOSE  --  162* 204*  BUN  --  16 22  CREATININE  --  1.05 1.07  CALCIUM  --  8.3* 8.3*  MG 2.1  --  2.3  PHOS  --   --  4.1   GFR: Estimated Creatinine Clearance: 73.3 mL/min (by C-G formula based on SCr of 1.07 mg/dL). Recent Labs  Lab 09/22/22 1908 09/23/22 0511  WBC 12.3* 13.2*    Liver Function Tests: Recent Labs  Lab 09/23/22 0511  AST 35  ALT 33  ALKPHOS 126  BILITOT 0.9  PROT 7.2  ALBUMIN 2.8*   No results for input(s): "LIPASE", "AMYLASE" in the last 168 hours. No results for input(s): "AMMONIA" in the last 168 hours.  ABG    Component Value Date/Time   PHART 7.365 11/02/2019 0858   PCO2ART 51.5 (H) 11/02/2019 0858   PO2ART 154.0 (H) 11/02/2019 0858   HCO3 29.4 (H) 11/02/2019 0858   TCO2 31 11/02/2019 0858   O2SAT 99.0 11/02/2019 0858     Coagulation Profile: No results for input(s): "INR", "PROTIME" in the last 168 hours.  Cardiac Enzymes: No results for input(s): "CKTOTAL", "CKMB", "CKMBINDEX", "TROPONINI" in the last 168 hours.  HbA1C: Hgb A1c MFr Bld  Date/Time Value Ref Range Status  09/15/2022 06:58 AM 7.1 (H) 4.8 - 5.6 % Final    Comment:    (NOTE)         Prediabetes: 5.7 - 6.4         Diabetes: >6.4         Glycemic control for adults with diabetes: <7.0     CBG: Recent Labs  Lab 09/16/22 1139 09/23/22 0727  GLUCAP 175* 219*      Past Medical History:  He,  has a past medical history of Arthritis, Atrial fibrillation ([place]) (11/02/2019), COPD (chronic obstructive pulmonary disease) ([place]), Coronary  artery disease, Diabetes mellitus without complication ([place]), GERD (gastroesophageal reflux disease), History of radiation therapy (09/11/20, 09/16/21, 09/18/20), Hyperlipidemia, Hypertension, Impingement syndrome of shoulder, Non-small cell cancer of right lung (HCC), Pericardial effusion, Shortness of  breath, Sleep apnea, Spinal stenosis, and Thrombocytopenia ([place]).   Surgical History:   Past Surgical History:  Procedure Laterality Date   BRONCHIAL BIOPSY  04/09/2020   Procedure: BRONCHIAL BIOPSIES;  Surgeon: Collene Gobble, MD;  Location: [facility] ENDOSCOPY;  Service: Pulmonary;;   BRONCHIAL BIOPSY  10/20/2021   Procedure: BRONCHIAL BIOPSIES;  Surgeon: Collene Gobble, MD;  Location: [facility] ENDOSCOPY;  Service: Pulmonary;;   BRONCHIAL BRUSHINGS  04/09/2020   Procedure: BRONCHIAL BRUSHINGS;  Surgeon: Collene Gobble, MD;  Location: [facility] ENDOSCOPY;  Service: Pulmonary;;   BRONCHIAL BRUSHINGS  07/30/2020   Procedure: BRONCHIAL BRUSHINGS;  Surgeon: Collene Gobble, MD;  Location: [facility] ENDOSCOPY;  Service: Pulmonary;;   BRONCHIAL BRUSHINGS  10/20/2021   Procedure: BRONCHIAL BRUSHINGS;  Surgeon: Collene Gobble, MD;  Location: [facility] ENDOSCOPY;  Service: Pulmonary;;   BRONCHIAL NEEDLE ASPIRATION BIOPSY  04/09/2020   Procedure: BRONCHIAL NEEDLE ASPIRATION BIOPSIES;  Surgeon: Collene Gobble, MD;  Location: [facility] ENDOSCOPY;  Service: Pulmonary;;   BRONCHIAL NEEDLE ASPIRATION BIOPSY  07/30/2020   Procedure: BRONCHIAL NEEDLE ASPIRATION BIOPSIES;  Surgeon: Collene Gobble, MD;  Location: [facility] ENDOSCOPY;  Service: Pulmonary;;   BRONCHIAL NEEDLE ASPIRATION BIOPSY  10/20/2021   Procedure: BRONCHIAL NEEDLE ASPIRATION BIOPSIES;  Surgeon: Collene Gobble, MD;  Location: [facility] ENDOSCOPY;  Service: Pulmonary;;   BRONCHIAL WASHINGS  04/09/2020   Procedure: BRONCHIAL WASHINGS;  Surgeon: Collene Gobble, MD;  Location: [place];  Service: Pulmonary;;   BRONCHIAL WASHINGS  07/30/2020   Procedure: BRONCHIAL WASHINGS;  Surgeon: Collene Gobble, MD;  Location: [place];  Service: Pulmonary;;   BRONCHIAL WASHINGS  10/20/2021   Procedure: BRONCHIAL WASHINGS;  Surgeon: Collene Gobble, MD;  Location: [facility] ENDOSCOPY;  Service: Pulmonary;;   CORONARY ATHERECTOMY N/A 01/18/2020   Procedure: CORONARY ATHERECTOMY;  Surgeon: Nelva Bush, MD;  Location: [place] CV LAB;  Service: Cardiovascular;  Laterality: N/A;   CORONARY STENT INTERVENTION  01/18/2020   CORONARY STENT INTERVENTION N/A 01/18/2020   Procedure: CORONARY STENT INTERVENTION;  Surgeon: Nelva Bush, MD;  Location: [place] CV LAB;  Service: Cardiovascular;  Laterality: N/A;   EYE SURGERY Left    FIDUCIAL MARKER PLACEMENT  04/09/2020   Procedure: FIDUCIAL MARKER PLACEMENT;  Surgeon: Collene Gobble, MD;  Location: [place];  Service: Pulmonary;;   FIDUCIAL MARKER PLACEMENT  10/20/2021   Procedure: FIDUCIAL MARKER PLACEMENT;  Surgeon: Collene Gobble, MD;  Location: [facility] ENDOSCOPY;  Service: Pulmonary;;   FINE NEEDLE ASPIRATION  04/09/2020   Procedure: FINE NEEDLE ASPIRATION (FNA) LINEAR;  Surgeon: Collene Gobble, MD;  Location: [place] ENDOSCOPY;  Service: Pulmonary;;   HAND SURGERY Right    thumb   INTRAVASCULAR PRESSURE WIRE/FFR STUDY N/A 11/02/2019   Procedure: INTRAVASCULAR PRESSURE WIRE/FFR STUDY;  Surgeon: Nelva Bush, MD;  Location: [place] CV LAB;  Service: Cardiovascular;  Laterality: N/A;   INTRAVASCULAR ULTRASOUND/IVUS N/A 01/18/2020   Procedure: Intravascular Ultrasound/IVUS;  Surgeon: Nelva Bush, MD;  Location: [place] CV LAB;  Service: Cardiovascular;  Laterality: N/A;   LUMBAR LAMINECTOMY/DECOMPRESSION MICRODISCECTOMY N/A 02/07/2014   Procedure: LUMBAR DECOMPRESSION Lumbar one-Lumbar five;  Surgeon: [name], MD;  Location: [facility] ORS;  Service: Orthopedics;  Laterality: N/A;   RIGHT/LEFT HEART CATH AND CORONARY ANGIOGRAPHY N/A 11/02/2019   Procedure: RIGHT/LEFT HEART CATH AND CORONARY ANGIOGRAPHY;  Surgeon: Nelva Bush, MD;   Location: [place] CV LAB;  Service: Cardiovascular;  Laterality: N/A;   SHOULDER ARTHROSCOPY WITH SUBACROMIAL DECOMPRESSION Left 04/12/2014   Procedure: LEFT SHOULDER  ARTHROSCOPY WITH SUBACROMIAL DECOMPRESSION AND LABRAL DEBRIDEMENT, ROTATOR CUFF DEBRIDEMENT, BICEPS DEBRIDEMENT;  Surgeon: [name], MD;  Location: [facility] ORS;  Service: Orthopedics;  Laterality: Left;   VIDEO BRONCHOSCOPY WITH ENDOBRONCHIAL NAVIGATION N/A 04/09/2020   Procedure: VIDEO BRONCHOSCOPY WITH ENDOBRONCHIAL NAVIGATION;  Surgeon: Collene Gobble, MD;  Location: [place] ENDOSCOPY;  Service: Pulmonary;  Laterality: N/A;   VIDEO BRONCHOSCOPY WITH ENDOBRONCHIAL NAVIGATION Right 07/30/2020   Procedure: VIDEO BRONCHOSCOPY WITH ENDOBRONCHIAL NAVIGATION;  Surgeon: Collene Gobble, MD;  Location: [place] ENDOSCOPY;  Service: Pulmonary;  Laterality: Right;   VIDEO BRONCHOSCOPY WITH ENDOBRONCHIAL ULTRASOUND N/A 04/09/2020   Procedure: VIDEO BRONCHOSCOPY WITH ENDOBRONCHIAL ULTRASOUND;  Surgeon: Collene Gobble, MD;  Location: [facility] ENDOSCOPY;  Service: Pulmonary;  Laterality: N/A;   VIDEO BRONCHOSCOPY WITH RADIAL ENDOBRONCHIAL ULTRASOUND  04/09/2020   Procedure: VIDEO BRONCHOSCOPY WITH RADIAL ENDOBRONCHIAL ULTRASOUND;  Surgeon: Collene Gobble, MD;  Location: [facility] ENDOSCOPY;  Service: Pulmonary;;   VIDEO BRONCHOSCOPY WITH RADIAL ENDOBRONCHIAL ULTRASOUND  10/20/2021   Procedure: VIDEO BRONCHOSCOPY WITH RADIAL ENDOBRONCHIAL ULTRASOUND;  Surgeon: Collene Gobble, MD;  Location: [place] ENDOSCOPY;  Service: Pulmonary;;     Social History:   reports that he has been smoking cigarettes. He has a 27.00 pack-year smoking history. He has never used smokeless tobacco. He reports that he does not drink alcohol and does not use drugs.   Family History:  His Family history is unknown by patient.   Allergies No Known Allergies   Home Medications  Prior to Admission medications   Medication Sig Start Date End Date Taking? Authorizing Provider  albuterol  (VENTOLIN HFA) 108 (90 Base) MCG/ACT inhaler Inhale 2 puffs into the lungs every 6 (six) hours as needed for wheezing or shortness of breath. 11/04/21  Yes Collene Gobble, MD  apixaban (ELIQUIS) 5 MG TABS tablet Take 1 tablet (5 mg total) by mouth 2 (two) times daily. 12/25/21  Yes Turner, Eber Hong, MD  buPROPion (WELLBUTRIN XL) 300 MG 24 hr tablet Take 300 mg by mouth daily.   Yes [provider]  clopidogrel (PLAVIX) 75 MG tablet Take 1 tablet (75 mg total) by mouth daily with breakfast. Okay to restart this medication on 10/21/2021 Patient taking differently: Take 75 mg by mouth daily with breakfast. 12/25/21  Yes Turner, Eber Hong, MD  cyclobenzaprine (FLEXERIL) 10 MG tablet Take 10 mg by mouth 2 (two) times daily as needed for muscle spasms.   Yes [provider]  diltiazem (CARDIZEM CD) 180 MG 24 hr capsule Take 1 capsule (180 mg total) by mouth daily. 09/16/22  Yes Nita Sells, MD  furosemide (LASIX) 40 MG tablet TAKE 1 TABLET BY MOUTH ONCE DAILY AS NEEDED FOR  EDEMA  OR  WEIGHT  GAIN Patient taking differently: Take 40 mg by mouth in the morning. 12/25/21  Yes Turner, Eber Hong, MD  isosorbide mononitrate (IMDUR) 60 MG 24 hr tablet Take 1 tablet (60 mg total) by mouth daily. 12/25/21  Yes Turner, Eber Hong, MD  metFORMIN (GLUCOPHAGE-XR) 500 MG 24 hr tablet Take 1,000 mg by mouth 2 (two) times daily with a meal.   Yes [provider]  metoprolol succinate (TOPROL-XL) 25 MG 24 hr tablet Take 1 tablet (25 mg total) by mouth daily. 12/25/21  Yes Turner, Eber Hong, MD  nitroGLYCERIN (NITROSTAT) 0.4 MG SL tablet Place 1 tablet (0.4 mg total) under the tongue every 5 (five) minutes x 3 doses as needed for chest pain. 12/25/21 12/25/22 Yes Turner, Eber Hong, MD  pantoprazole ([place])  40 MG tablet Take 1 tablet (40 mg total) by mouth daily. Patient taking differently: Take 40 mg by mouth daily before breakfast. 01/20/20  Yes Kathyrn Drown D, NP  potassium chloride SA (KLOR-CON M) 20 MEQ  tablet TAKE 1 TABLET BY MOUTH ONCE DAILY AS NEEDED - TAKE ON THE DAYS YOU TAKE LASIX Patient taking differently: Take 20 mEq by mouth daily. 12/25/21  Yes Sueanne Margarita, MD  PRESCRIPTION MEDICATION See admin instructions. CPAP- Use during any time of rest   Yes [provider]  prochlorperazine (COMPAZINE) 10 MG tablet Take 1 tablet (10 mg total) by mouth every 6 (six) hours as needed. Patient taking differently: Take 10 mg by mouth every 6 (six) hours as needed for nausea or vomiting. 08/26/22  Yes Heilingoetter, Cassandra L, PA-C  rosuvastatin (CRESTOR) 40 MG tablet Take 1 tablet (40 mg total) by mouth daily. 12/25/21  Yes Turner, Eber Hong, MD  TYLENOL 500 MG tablet Take 500-1,000 mg by mouth every 6 (six) hours as needed for mild pain or headache.   Yes [provider]  valsartan (DIOVAN) 320 MG tablet Take 160 mg by mouth daily.   Yes [provider]  umeclidinium-vilanterol (ANORO ELLIPTA) 62.5-25 MCG/ACT AEPB Inhale 1 puff into the lungs daily. Patient not taking: Reported on 08/19/2022 10/22/21   Collene Gobble, MD    The patient is critically ill with multiple organ systems failure and requires high complexity decision making for assessment and support, frequent evaluation and titration of therapies, application of advanced monitoring technologies and extensive interpretation of multiple databases. Critical Care Time devoted to patient care services described in this note is 60  minutes.   Christinia Gully, MD Pulmonary and [place] [phone]   After 7:00 pm call [place]  [phone]

## 2022-09-23 NOTE — Consult Note (Addendum)
Cardiology Consultation   Patient ID: Daniel Ball MRN: [id number]; DOB: 12/27/1955  Admit date: 09/22/2022 Date of Consult: 09/23/2022  PCP:  [name], MD   [place] Providers Cardiologist:  [name], MD        Patient Profile:   Daniel Ball is a 67 y.o. male with a hx of CAD s/p atherectomy/DES to LAD 12/2019, paroxysmal atrial fibrillation, COPD, DM, GERD, OSA on CPAP, HTN, HLD, ongoing tobacco abuse, NSCLC s/p XRT with more recent recurrent lung CA with multifocal thoracic metastatic disease dx 07/2022 (SCC), spinal stenosis who was seen 09/15/2022 for the evaluation of atrial fibrillation who is being seen 09/23/2022 for the evaluation of Afib with RVR at the request of Dr. Roger Shelter.  History of Present Illness:   Daniel Ball is a 67 yo male with above PMHx. We saw in the hospital 09/16/22 for Afib with RVR treated with metoprolol and eliquis. Diltiazem 180 mg added. Valsartan and amlodipine stopped.Recent Monitor showed Afib burden 30%. HR in 60's when in NSR on metoprolol alone. Echocardiogram done on 12/26 showed LVEF of 55 to 60%. No RWMA. Mild LVH. LV diastolic parameters indeterminate.  Patient readmitted last night with Afib with RVR and acute respiratory failure with hypoxia  requiring BiPAP.  BNP 333, troponin negative. Patient was really struggling with his breathing yest and O2sats at home 77 so came in. His Afib has been acting up since starting chemo last month. He says diltiazem works for about 40 min and then his heart starts racing again. They are asking for O2 at home. Pending transfer to [facility] chemo treatment scheduled for tomorrow.  Past Medical History:  Diagnosis Date   Arthritis    Atrial fibrillation ([place]) 11/02/2019   COPD (chronic obstructive pulmonary disease) (HCC)    Coronary artery disease    a. 12/2019: s/p orbital atherectomy and DES placement to proximal/mid LAD.    Diabetes mellitus without complication ([place])     GERD (gastroesophageal reflux disease)    History of radiation therapy 09/11/20, 09/16/21, 09/18/20    Right lung- SBRT Dr. Sondra Come    Hyperlipidemia    Hypertension    hx HBP - MEDS DC'D 10 YRS since BP has been in normal range   Impingement syndrome of shoulder    left   Non-small cell cancer of right lung (HCC)    Pericardial effusion    Shortness of breath    with exertion   Sleep apnea    uses a cpap   Spinal stenosis    Thrombocytopenia [facility])     Past Surgical History:  Procedure Laterality Date   BRONCHIAL BIOPSY  04/09/2020   Procedure: BRONCHIAL BIOPSIES;  Surgeon: Collene Gobble, MD;  Location: [place];  Service: Pulmonary;;   BRONCHIAL BIOPSY  10/20/2021   Procedure: BRONCHIAL BIOPSIES;  Surgeon: Collene Gobble, MD;  Location: [place];  Service: Pulmonary;;   BRONCHIAL BRUSHINGS  04/09/2020   Procedure: BRONCHIAL BRUSHINGS;  Surgeon: Collene Gobble, MD;  Location: [facility] ENDOSCOPY;  Service: Pulmonary;;   BRONCHIAL BRUSHINGS  07/30/2020   Procedure: BRONCHIAL BRUSHINGS;  Surgeon: Collene Gobble, MD;  Location: [facility] ENDOSCOPY;  Service: Pulmonary;;   BRONCHIAL BRUSHINGS  10/20/2021   Procedure: BRONCHIAL BRUSHINGS;  Surgeon: Collene Gobble, MD;  Location: [facility] ENDOSCOPY;  Service: Pulmonary;;   BRONCHIAL NEEDLE ASPIRATION BIOPSY  04/09/2020   Procedure: BRONCHIAL NEEDLE ASPIRATION BIOPSIES;  Surgeon: Collene Gobble, MD;  Location: [facility] ENDOSCOPY;  Service: Pulmonary;;  BRONCHIAL NEEDLE ASPIRATION BIOPSY  07/30/2020   Procedure: BRONCHIAL NEEDLE ASPIRATION BIOPSIES;  Surgeon: Collene Gobble, MD;  Location: [facility] ENDOSCOPY;  Service: Pulmonary;;   BRONCHIAL NEEDLE ASPIRATION BIOPSY  10/20/2021   Procedure: BRONCHIAL NEEDLE ASPIRATION BIOPSIES;  Surgeon: Collene Gobble, MD;  Location: [place];  Service: Pulmonary;;   BRONCHIAL WASHINGS  04/09/2020   Procedure: BRONCHIAL WASHINGS;  Surgeon: Collene Gobble, MD;  Location: [place];  Service: Pulmonary;;    BRONCHIAL WASHINGS  07/30/2020   Procedure: BRONCHIAL WASHINGS;  Surgeon: Collene Gobble, MD;  Location: [place];  Service: Pulmonary;;   BRONCHIAL WASHINGS  10/20/2021   Procedure: BRONCHIAL WASHINGS;  Surgeon: Collene Gobble, MD;  Location: [facility] ENDOSCOPY;  Service: Pulmonary;;   CORONARY ATHERECTOMY N/A 01/18/2020   Procedure: CORONARY ATHERECTOMY;  Surgeon: Nelva Bush, MD;  Location: [place] CV LAB;  Service: Cardiovascular;  Laterality: N/A;   CORONARY STENT INTERVENTION  01/18/2020   CORONARY STENT INTERVENTION N/A 01/18/2020   Procedure: CORONARY STENT INTERVENTION;  Surgeon: Nelva Bush, MD;  Location: [place] CV LAB;  Service: Cardiovascular;  Laterality: N/A;   EYE SURGERY Left    FIDUCIAL MARKER PLACEMENT  04/09/2020   Procedure: FIDUCIAL MARKER PLACEMENT;  Surgeon: Collene Gobble, MD;  Location: [place];  Service: Pulmonary;;   FIDUCIAL MARKER PLACEMENT  10/20/2021   Procedure: FIDUCIAL MARKER PLACEMENT;  Surgeon: Collene Gobble, MD;  Location: [facility] ENDOSCOPY;  Service: Pulmonary;;   FINE NEEDLE ASPIRATION  04/09/2020   Procedure: FINE NEEDLE ASPIRATION (FNA) LINEAR;  Surgeon: Collene Gobble, MD;  Location: [place] ENDOSCOPY;  Service: Pulmonary;;   HAND SURGERY Right    thumb   INTRAVASCULAR PRESSURE WIRE/FFR STUDY N/A 11/02/2019   Procedure: INTRAVASCULAR PRESSURE WIRE/FFR STUDY;  Surgeon: Nelva Bush, MD;  Location: [place] CV LAB;  Service: Cardiovascular;  Laterality: N/A;   INTRAVASCULAR ULTRASOUND/IVUS N/A 01/18/2020   Procedure: Intravascular Ultrasound/IVUS;  Surgeon: Nelva Bush, MD;  Location: [place] CV LAB;  Service: Cardiovascular;  Laterality: N/A;   LUMBAR LAMINECTOMY/DECOMPRESSION MICRODISCECTOMY N/A 02/07/2014   Procedure: LUMBAR DECOMPRESSION Lumbar one-Lumbar five;  Surgeon: [name], MD;  Location: [facility];  Service: Orthopedics;  Laterality: N/A;   RIGHT/LEFT HEART CATH AND CORONARY ANGIOGRAPHY N/A 11/02/2019    Procedure: RIGHT/LEFT HEART CATH AND CORONARY ANGIOGRAPHY;  Surgeon: Nelva Bush, MD;  Location: [place] CV LAB;  Service: Cardiovascular;  Laterality: N/A;   SHOULDER ARTHROSCOPY WITH SUBACROMIAL DECOMPRESSION Left 04/12/2014   Procedure: LEFT SHOULDER ARTHROSCOPY WITH SUBACROMIAL DECOMPRESSION AND LABRAL DEBRIDEMENT, ROTATOR CUFF DEBRIDEMENT, BICEPS DEBRIDEMENT;  Surgeon: [name], MD;  Location: [facility];  Service: Orthopedics;  Laterality: Left;   VIDEO BRONCHOSCOPY WITH ENDOBRONCHIAL NAVIGATION N/A 04/09/2020   Procedure: VIDEO BRONCHOSCOPY WITH ENDOBRONCHIAL NAVIGATION;  Surgeon: Collene Gobble, MD;  Location: [place] ENDOSCOPY;  Service: Pulmonary;  Laterality: N/A;   VIDEO BRONCHOSCOPY WITH ENDOBRONCHIAL NAVIGATION Right 07/30/2020   Procedure: VIDEO BRONCHOSCOPY WITH ENDOBRONCHIAL NAVIGATION;  Surgeon: Collene Gobble, MD;  Location: [place] ENDOSCOPY;  Service: Pulmonary;  Laterality: Right;   VIDEO BRONCHOSCOPY WITH ENDOBRONCHIAL ULTRASOUND N/A 04/09/2020   Procedure: VIDEO BRONCHOSCOPY WITH ENDOBRONCHIAL ULTRASOUND;  Surgeon: Collene Gobble, MD;  Location[facility] ENDOSCOPY;  Service: Pulmonary;  Laterality: N/A;   VIDEO BRONCHOSCOPY WITH RADIAL ENDOBRONCHIAL ULTRASOUND  04/09/2020   Procedure: VIDEO BRONCHOSCOPY WITH RADIAL ENDOBRONCHIAL ULTRASOUND;  Surgeon: Collene Gobble, MD;  Location: [facility] ENDOSCOPY;  Service: Pulmonary;;   VIDEO BRONCHOSCOPY WITH RADIAL ENDOBRONCHIAL ULTRASOUND  10/20/2021  Procedure: VIDEO BRONCHOSCOPY WITH RADIAL ENDOBRONCHIAL ULTRASOUND;  Surgeon: Collene Gobble, MD;  Location: [place] ENDOSCOPY;  Service: Pulmonary;;     Home Medications:  Prior to Admission medications   Medication Sig Start Date End Date Taking? Authorizing Provider  albuterol (VENTOLIN HFA) 108 (90 Base) MCG/ACT inhaler Inhale 2 puffs into the lungs every 6 (six) hours as needed for wheezing or shortness of breath. 11/04/21  Yes Collene Gobble, MD  apixaban (ELIQUIS) 5 MG TABS tablet Take 1 tablet  (5 mg total) by mouth 2 (two) times daily. 12/25/21  Yes Turner, Eber Hong, MD  buPROPion (WELLBUTRIN XL) 300 MG 24 hr tablet Take 300 mg by mouth daily.   Yes [provider]  clopidogrel (PLAVIX) 75 MG tablet Take 1 tablet (75 mg total) by mouth daily with breakfast. Okay to restart this medication on 10/21/2021 Patient taking differently: Take 75 mg by mouth daily with breakfast. 12/25/21  Yes Turner, Eber Hong, MD  cyclobenzaprine (FLEXERIL) 10 MG tablet Take 10 mg by mouth 2 (two) times daily as needed for muscle spasms.   Yes [provider]  diltiazem (CARDIZEM CD) 180 MG 24 hr capsule Take 1 capsule (180 mg total) by mouth daily. 09/16/22  Yes Nita Sells, MD  furosemide (LASIX) 40 MG tablet TAKE 1 TABLET BY MOUTH ONCE DAILY AS NEEDED FOR  EDEMA  OR  WEIGHT  GAIN Patient taking differently: Take 40 mg by mouth in the morning. 12/25/21  Yes Turner, Eber Hong, MD  isosorbide mononitrate (IMDUR) 60 MG 24 hr tablet Take 1 tablet (60 mg total) by mouth daily. 12/25/21  Yes Turner, Eber Hong, MD  metFORMIN (GLUCOPHAGE-XR) 500 MG 24 hr tablet Take 1,000 mg by mouth 2 (two) times daily with a meal.   Yes [provider]  metoprolol succinate (TOPROL-XL) 25 MG 24 hr tablet Take 1 tablet (25 mg total) by mouth daily. 12/25/21  Yes Turner, Eber Hong, MD  nitroGLYCERIN (NITROSTAT) 0.4 MG SL tablet Place 1 tablet (0.4 mg total) under the tongue every 5 (five) minutes x 3 doses as needed for chest pain. 12/25/21 12/25/22 Yes Turner, Eber Hong, MD  pantoprazole (PROTONIX) 40 MG tablet Take 1 tablet (40 mg total) by mouth daily. Patient taking differently: Take 40 mg by mouth daily before breakfast. 01/20/20  Yes Kathyrn Drown D, NP  potassium chloride SA (KLOR-CON M) 20 MEQ tablet TAKE 1 TABLET BY MOUTH ONCE DAILY AS NEEDED - TAKE ON THE DAYS YOU TAKE LASIX Patient taking differently: Take 20 mEq by mouth daily. 12/25/21  Yes Sueanne Margarita, MD  PRESCRIPTION MEDICATION See admin instructions. CPAP-  Use during any time of rest   Yes [provider]  prochlorperazine (COMPAZINE) 10 MG tablet Take 1 tablet (10 mg total) by mouth every 6 (six) hours as needed. Patient taking differently: Take 10 mg by mouth every 6 (six) hours as needed for nausea or vomiting. 08/26/22  Yes Heilingoetter, Cassandra L, PA-C  rosuvastatin (CRESTOR) 40 MG tablet Take 1 tablet (40 mg total) by mouth daily. 12/25/21  Yes Turner, Eber Hong, MD  TYLENOL 500 MG tablet Take 500-1,000 mg by mouth every 6 (six) hours as needed for mild pain or headache.   Yes [provider]  valsartan (DIOVAN) 320 MG tablet Take 160 mg by mouth daily.   Yes [provider]  umeclidinium-vilanterol (ANORO ELLIPTA) 62.5-25 MCG/ACT AEPB Inhale 1 puff into the lungs daily. Patient not taking: Reported on 08/19/2022 10/22/21   Byrum,  Rose Fillers, MD    Inpatient Medications: Scheduled Meds:  apixaban  5 mg Oral BID   budesonide (PULMICORT) nebulizer solution  0.25 mg Nebulization BID   Chlorhexidine Gluconate Cloth  6 each Topical Q0600   clopidogrel  75 mg Oral Q breakfast   insulin aspart  0-15 Units Subcutaneous TID WC   insulin glargine-yfgn  5 Units Subcutaneous QHS   ipratropium  0.5 mg Nebulization TID   levalbuterol  0.63 mg Nebulization TID   mouth rinse  15 mL Mouth Rinse 4 times per day   pantoprazole  40 mg Oral Daily   rosuvastatin  40 mg Oral Daily   Continuous Infusions:  diltiazem (CARDIZEM) infusion 15 mg/hr (09/23/22 0723)   PRN Meds: hydrOXYzine, levalbuterol, morphine injection, mouth rinse, prochlorperazine  Allergies:   No Known Allergies  Social History:   Social History   Socioeconomic History   Marital status: Married    Spouse name: Not on file   Number of children: Not on file   Years of education: Not on file   Highest education level: Not on file  Occupational History   Occupation: Pipefitter  Tobacco Use   Smoking status: Every Day    Packs/day: 0.50    Years: 54.00     Total pack years: 27.00    Types: Cigarettes   Smokeless tobacco: Never   Tobacco comments:    10 cigarettes a day ARJ 11/04/21  Vaping Use   Vaping Use: Never used  Substance and Sexual Activity   Alcohol use: No   Drug use: No   Sexual activity: Not on file  Other Topics Concern   Not on file  Social History Narrative   Not on file   Social Determinants of Health   Financial Resource Strain: Not on file  Food Insecurity: No Food Insecurity (09/23/2022)   Hunger Vital Sign    Worried About Running Out of Food in the Last Year: Never true    Ran Out of Food in the Last Year: Never true  Transportation Needs: No Transportation Needs (09/23/2022)   PRAPARE - Hydrologist (Medical): No    Lack of Transportation (Non-Medical): No  Physical Activity: Not on file  Stress: Not on file  Social Connections: Not on file  Intimate Partner Violence: Not At Risk (09/23/2022)   Humiliation, Afraid, Rape, and Kick questionnaire    Fear of Current or Ex-Partner: No    Emotionally Abused: No    Physically Abused: No    Sexually Abused: No      Family History  Family history unknown: Yes     ROS:  Please see the history of present illness.  Review of Systems  Constitutional: Negative.  HENT: Negative.    Cardiovascular:  Positive for dyspnea on exertion, irregular heartbeat and palpitations.  Respiratory:  Positive for cough, shortness of breath, sleep disturbances due to breathing, snoring and wheezing.   Endocrine: Negative.   Hematologic/Lymphatic: Negative.   Musculoskeletal: Negative.   Gastrointestinal: Negative.   Genitourinary: Negative.   Neurological: Negative.    All other ROS reviewed and negative.     Physical Exam/Data:   Vitals:   09/23/22 0758 09/23/22 0759 09/23/22 0800 09/23/22 0816  BP:   (!) 132/101 130/73  Pulse: (!) 106 (!) 41 (!) 53 (!) 106  Resp: (!) 27 (!) 40 (!) 37 (!) 39  Temp:      TempSrc:      SpO2: 93% (!) 81% 91%  96%  Weight:      Height:        Intake/Output Summary (Last 24 hours) at 09/23/2022 0932 Last data filed at 09/23/2022 0723 Gross per 24 hour  Intake 116 ml  Output 200 ml  Net -84 ml      09/23/2022    4:00 AM 09/23/2022    1:00 AM 09/22/2022    7:18 PM  Last 3 Weights  Weight (lbs) 236 lb 1.8 oz 236 lb 1.8 oz 235 lb  Weight (kg) 107.1 kg 107.1 kg 106.595 kg     Body mass index is 40.53 kg/m.  General:  Obese, in no acute distress  HEENT: normal Neck: no JVD Vascular: No carotid bruits; Distal pulses 2+ bilaterally Cardiac:  normal S1, S2; irreg 100/m distant heart sounds Lungs:  decreased breath sounds with scattered wheezing, rhonchi Abd: soft, nontender, no hepatomegaly  Ext: no edema Musculoskeletal:  No deformities, BUE and BLE strength normal and equal Skin: warm and dry  Neuro:  CNs 2-12 intact, no focal abnormalities noted Psych:  Normal affect   EKG:  The EKG was personally reviewed and demonstrates:  Afib with RVR ? Lead placement problem. Repeat EKG today Telemetry:  Telemetry was personally reviewed and demonstrates:  Afib mostly controlled now on IV diltiazem 15 mg/hr  Relevant CV Studies: 2d echo 09/15/22   1. Left ventricular ejection fraction, by estimation, is 55 to 60%. The  left ventricle has normal function. The left ventricle has no regional  wall motion abnormalities. There is mild left ventricular hypertrophy.  Left ventricular diastolic parameters  are indeterminate.   2. Right ventricular systolic function is normal. The right ventricular  size is normal. Tricuspid regurgitation signal is inadequate for assessing  PA pressure.   3. A small pericardial effusion is present.   4. The mitral valve was not well visualized. No evidence of mitral valve  regurgitation. No evidence of mitral stenosis.   5. The aortic valve is grossly normal. There is mild thickening of the  aortic valve. Aortic valve regurgitation is not visualized. No aortic  stenosis  is present.    Laboratory Data:  High Sensitivity Troponin:   Recent Labs  Lab 09/15/22 0502 09/15/22 0658 09/22/22 1907 09/22/22 2108  TROPONINIHS 14 14 10 11      Chemistry Recent Labs  Lab 09/22/22 1907 09/22/22 1908 09/23/22 0511  NA  --  131* 131*  K  --  4.0 4.0  CL  --  99 98  CO2  --  21* 22  GLUCOSE  --  162* 204*  BUN  --  16 22  CREATININE  --  1.05 1.07  CALCIUM  --  8.3* 8.3*  MG 2.1  --  2.3  GFRNONAA  --  >60 >60  ANIONGAP  --  11 11    Recent Labs  Lab 09/23/22 0511  PROT 7.2  ALBUMIN 2.8*  AST 35  ALT 33  ALKPHOS 126  BILITOT 0.9   Hematology Recent Labs  Lab 09/22/22 1908 09/23/22 0511  WBC 12.3* 13.2*  RBC 3.59* 3.35*  HGB 11.1* 10.4*  HCT 32.9* 30.6*  MCV 91.6 91.3  MCH 30.9 31.0  MCHC 33.7 34.0  RDW 15.5 15.6*  PLT 219 210    BNP Recent Labs  Lab 09/22/22 1909  BNP 333.0*     Radiology/Studies:  DG CHEST PORT 1 VIEW  Result Date: 09/23/2022 CLINICAL DATA:  Shortness of breath. EXAM: PORTABLE CHEST 1 VIEW COMPARISON:  09/22/2022  and CT chest 09/15/2022. FINDINGS: Trachea is midline. Heart is enlarged. Coarsened interstitial markings bilaterally with post treatment scarring and fiducial markers in the right upper lobe. Findings are unchanged from 09/22/2022. No pleural fluid. IMPRESSION: 1. No acute findings. 2. Post treatment scarring in the right upper lobe. Known metastatic lesions in the right hemithorax are better seen on recent cross-sectional imaging. 3. Coarsened interstitial pulmonary markings, as on 09/15/2022. If further evaluation is desired, outpatient high-resolution chest CT without contrast could be performed. Electronically Signed   By: Lorin Picket M.D.   On: 09/23/2022 08:45   DG Chest Port 1 View  Result Date: 09/22/2022 CLINICAL DATA:  Shortness of breath and chest pain EXAM: PORTABLE CHEST 1 VIEW COMPARISON:  CT and x-ray 09/15/2022.  Older exams as well. FINDINGS: Extensive interstitial changes again  identified, similar to previous. No pneumothorax, effusion or consolidation. Stable cardiopericardial silhouette. Overlapping cardiac leads. Surgical clips overlie the upper right hemithorax. Degenerative changes along the spine and shoulders. IMPRESSION: No significant interval change. Stable diffuse interstitial changes single lungs, possibly fibrosis. Please correlate with prior CT Electronically Signed   By: Jill Side M.D.   On: 09/22/2022 19:21     Assessment and Plan:   Afib with RVR on eliquis, metoprolol xl 25 mg daily and diltiazem 180 mg daily at home. Now on Iv diltiazem 15 mg/hr. Recent holter showed Afib 30% burden, HR's in NSR in 60's when on toprol 25 mg daily. Can try to increase diltiazem 360 mg daily. Also could send home with short acting 30 mg prn once rate controlled here. Hold off on beta blocker at this time.  Acute hypoxic resp failure requiring BiPAP-Also going through chemo/radiation for recurrent lung CA, slightly elevated BNP-probably from rapid Afib. Recent echo normal LVEF, indeterminate diastolic parameters  CAD s/p atherectomy/DES to LAD 12/2019, mild LV dysfunction by cath 10/2019 with normal LVEF on Plavix, imdur rosuvastatin  HTN BP high this am  Progressive metastatic stage 4 lung CA on chemo/radiation  OSA on CPAP   Risk Assessment/Risk Scores:     CHA2DS2-VASc Score = 3   This indicates a 3.2% annual risk of stroke. The patient's score is based upon: CHF History: 0 HTN History: 1 Diabetes History: 0 Stroke History: 0 Vascular Disease History: 1 Age Score: 1 Gender Score: 0       For questions or updates, please contact [place] Please consult [email or web address] for contact info under    Signed, Ermalinda Barrios, PA-C 09/23/2022 9:32 AM    Attending note:  Patient seen and examined.  I reviewed his records and discussed the case with Ms. Bonnell Public PA-C.  Daniel Ball presents with atrial fibrillation with RVR in the setting of hypoxic  respiratory failure.  He has metastatic stage IV NSCLC managed with carboplatin, Taxol, Libtayo as well as XRT, recently hospitalized with rapid atrial fibrillation and medications adjusted by cardiology team at [facility].  He has paroxysmal to persistent atrial fibrillation, 30% arrhythmia burden by most recent outpatient monitoring and also when in sinus rhythm on beta-blocker alone heart rate was in the 60s.  His most recent regimen included Cardizem CD 180 mg daily and Toprol-XL 25 mg daily.  On examination now patient reports shortness of breath, on supplemental oxygen via facemask and requiring BiPAP initially at presentation.  Currently afebrile, heart rate 90-100 on IV diltiazem 15 mg an hour, telemetry reviewed.  Blood pressure 130/73.  Coarse breath sounds throughout, cardiac exam with irregularly irregular rhythm.  Pertinent  lab work includes sodium 131, potassium 4.0, BUN 22, creatinine 1.07, normal LFTs, BNP 333, normal high-sensitivity troponin I levels, WBC 13.2, hemoglobin 10.4, platelets 210.  Chest x-ray shows posttreatment right upper lobe scarring with known lesions in the right hemithorax, also coarsened interstitial markings.  I personally viewed his ECG from January 2 showing atrial fibrillation with RVR.  Patient pending transfer to [facility] for further treatment of his lung cancer.  He is on IV diltiazem 15 mg/h for now.  When able to transition to oral regimen, would try Cardizem CD 360 mg daily.  Could potentially add more cardioselective beta-blocker such as bisoprolol next if needed.  I do have some concerns about tachycardia-bradycardia syndrome however given his heart rate in the 60s when in sinus rhythm on Toprol-XL alone as an outpatient.  Continue Eliquis for stroke prophylaxis.  He is also on Plavix with prior history of atherectomy and DES to the proximal and mid LAD in 2021.  Our service will follow at [facility].  Suggest Pulmonary consultation.  Satira Sark, M.D., F.A.C.C.

## 2022-09-23 NOTE — Progress Notes (Signed)
PROGRESS NOTE    Patient: Daniel Ball                            PCP: [name], MD                    DOB: 04-07-1956            DOA: 09/22/2022 EQA:[id number]             DOS: 09/23/2022, 12:45 PM   LOS: 0 days   Date of Service: The patient was seen and examined on 09/23/2022  Subjective:   The patient was seen and examined this morning. Awake following command, In respiratory distress, 15 L of oxygen by nasal cannula and nonrebreather he cannot tolerate BiPAP And A-fib, on Cardizem drip  Family requesting patient to be transferred to [facility] where his oncologist can evaluate.  Brief Narrative:    Daniel Ball is a 67 y.o. male with medical history significant of atrial fibrillation, CAD, hypertension, hyperlipidemia, COPD, GERD lung cancer on active chemotherapy and radiation (follows with Dr. Earlie Server) and class III obesity who presents to the emergency department due to shortness of breath on exertion and nonproductive cough Patient was recently admitted to [facility] from 5/26 to 12/27 due to atrial fibrillation with RVR and this was treated with Cardizem.  He was discharged home and was doing fine for few days, but this worsened again and presented to the ED for further evaluation and management. Patient complained of some chest discomfort which occurs mostly when he has palpitations.   ED Course:  Patient was tachypneic and tachycardic, BP was 146/73, O2 sat was 96% on NRB, was transitioned to BiPAP and O2 sats increased to 100% at FiO2 of 50%..  Workup in the ED showed leukocytosis and normocytic anemia.  BMP was normal except for hyponatremia and hyperglycemia.  BNP 333, troponin x 2 was negative, magnesium 2.1.   Influenza A, B, SARS coronavirus 2, RSV was negative. Chest x-ray showed no  significant interval change. Stable diffuse interstitial changes single lungs, possibly fibrosis. Patient was started on Cardizem drip, cardiology on-call at [facility] was  consulted and states that patient can stay here at [facility].   Hospitalist was asked to admit patient for further evaluation and management.       Assessment & Plan:   Principal Problem:   Atrial fibrillation with RVR (HCC) Active Problems:   Essential hypertension   GERD   COPD (chronic obstructive pulmonary disease) (HCC)   Obesity, Class III, BMI 40-49.9 (morbid obesity) ([place])   Malignant neoplasm of right upper lobe of lung (HCC)   Thrombocytopenia (HCC)   Acute respiratory failure with hypoxia (HCC)   Leukocytosis   Hyponatremia   Elevated brain natriuretic peptide (BNP) level   Type 2 diabetes mellitus with hyperglycemia (HCC)   Mixed hyperlipidemia    Acute respiratory failure with hypoxia -Is in respiratory distress, hypoxic-wean BiPAP  With history of lung cancer follows with Dr. Earlie Server Currently on radiation therapy  -> He was satting 92% on BiPAP but he cannot tolerate it due to being claustrophobic -Currently he is placed on 15 L of oxygen via nasal cannula, and nonrebreather will Ventimask  ABG    Component Value Date/Time   PHART 7.49 (H) 09/23/2022 0905   PCO2ART 35 09/23/2022 0905   PO2ART 136 (H) 09/23/2022 0905   HCO3 26.8 09/23/2022 0905   TCO2 31  11/02/2019 0858   O2SAT 99.2 09/23/2022 0905      -The patient and the family confirmed that he is not O2 dependent at baseline -Had recent hospitalization last week at [facility] for similar  -Discussed the case with pulmonary Dr. Mertha Finders..  Recommending BiPAP, ABGs, chest x-ray, high flow oxygen for now, breathing treatments (switch to Xopenex/Atrovent due to A-fib-tachyarrhythmia) High-dose IV steroids  -Family requested patient to be transferred to [facility] that is where his radiation oncologist Is, he was supposed get radiation   Elevated BNP BNP 333 (this was 220.9 about 8 days ago) Echocardiogram done on 12/26 showed LVEF of 55 to 60%.  No RWMA.  Mild LVH.  LV diastolic parameters  indeterminate. Continue total input/output, daily weights and fluid restriction EKG >>> and A-fib  Echocardiogram >>> Cardiology was consulted, Dr. Domenic Polite following appreciate input    Atrial fibrillation with RVR Continue Cardizem drip -continue Cardizem drip Considering switching him to Cardizem CD3 160 mg daily and possibly Bystolic -Continue Eliquis Continue telemetry Cardiology will be consulted and we shall await further recommendation Currently on Cardizem drip   Leukocytosis possibly reactive WBC 12.3 >>> 0.2Type 2 diabetes mellitus with hyperglycemia Hemoglobin A1c about 8 days ago was 7.1 Continue Semglee 5 units nightly and adjust dose accordingly Continue ISS and hypoglycemia Metformin will be held at this time   Hyponatremia Sodium 131, this is possibly secondary to diuretic effect Continue to monitor sodium levels   Essential hypertension Patient is currently on Cardizem drip Hold further home meds at this time   Mixed hyperlipidemia Continue Crestor   COPD Continue Xopenex   GERD Continue Protonix   CAD Continue Plavix, Crestor   Goals of care: Palliative care will be consulted Full Code    Class III obesity BMI 40.53; continue lifestyle and diet modification   Lung cancer Metastatic stage IV NSCLC on carboplatin Taxol Libtayo X1 cycle status post XRT 2023 (destructive lesion right rib) + recurrence multifocal thoracic mets follows with Dr. Earlie Server (recent OV 09/08/2022)     ---------------------------------------------------------------------------------------------------------------------------------- Nutritional status:  The patient's BMI is: Body mass index is 40.53 kg/m. I agree with the assessment and plan as outlined       ------------------------------------------------------------------------------------------------------------------------------------------------  DVT prophylaxis:  SCDs Start: 09/23/22 0344 apixaban  (ELIQUIS) tablet 5 mg   Code Status:   Code Status: Full Code  Family Communication: No family member present at bedside- attempt will be made to update daily The above findings and plan of care has been discussed with patient (and family)  in detail,  they expressed understanding and agreement of above. -Advance care planning has been discussed.   Admission status:   Status is: Observation The patient remains OBS appropriate and will d/c before 2 midnights.     Procedures:   No admission procedures for hospital encounter.   Antimicrobials:  Anti-infectives (From admission, onward)    None        Medication:   apixaban  5 mg Oral BID   budesonide (PULMICORT) nebulizer solution  0.25 mg Nebulization BID   Chlorhexidine Gluconate Cloth  6 each Topical Q0600   clopidogrel  75 mg Oral Q breakfast   insulin aspart  0-15 Units Subcutaneous TID WC   insulin glargine-yfgn  5 Units Subcutaneous QHS   ipratropium  0.5 mg Nebulization TID   levalbuterol  0.63 mg Nebulization TID   methylPREDNISolone (SOLU-MEDROL) injection  125 mg Intravenous Q12H   mouth rinse  15 mL Mouth Rinse 4 times per  day   pantoprazole  40 mg Oral Daily   rosuvastatin  40 mg Oral Daily    hydrOXYzine, levalbuterol, morphine injection, mouth rinse, prochlorperazine   Objective:   Vitals:   09/23/22 0759 09/23/22 0800 09/23/22 0816 09/23/22 1059  BP:  (!) 132/101 130/73   Pulse: (!) 41 (!) 53 (!) 106 91  Resp: (!) 40 (!) 37 (!) 39 (!) 31  Temp:    97.9 F (36.6 C)  TempSrc:    Oral  SpO2: (!) 81% 91% 96% 99%  Weight:      Height:        Intake/Output Summary (Last 24 hours) at 09/23/2022 1245 Last data filed at 09/23/2022 4650 Gross per 24 hour  Intake 116 ml  Output 200 ml  Net -84 ml   Filed Weights   09/22/22 1918 09/23/22 0100 09/23/22 0400  Weight: 106.6 kg 107.1 kg 107.1 kg     Examination:   Physical Exam  Constitution:  Alert, cooperative, In distress SOB, NCO Ls     Psychiatric:   Normal and stable mood and affect, cognition intact,   HEENT:        Normocephalic, PERRL, otherwise with in Normal limits  Chest:         Chest symmetric Cardio vascular:  S1/S2, RRR, No murmure, No Rubs or Gallops  pulmonary: On 15 L of high flow oxygen by nasal cannula -  breath exchange mid to lower lobes, positive for rhonchi, wheezing Abdomen: Soft, non-tender, non-distended, bowel sounds,no masses, no organomegaly Muscular skeletal: Limited exam - in bed, able to move all 4 extremities,   Neuro: CNII-XII intact. , normal motor and sensation, reflexes intact  Extremities: No pitting edema lower extremities, +2 pulses  Skin: Dry, warm to touch, negative for any Rashes, No open wounds Wounds: per nursing documentation   ------------------------------------------------------------------------------------------------------------------------------------------    LABs:     Latest Ref Rng & Units 09/23/2022    5:11 AM 09/22/2022    7:08 PM 09/16/2022    5:17 AM  CBC  WBC 4.0 - 10.5 K/uL 13.2  12.3  9.1   Hemoglobin 13.0 - 17.0 g/dL 10.4  11.1  12.7   Hematocrit 39.0 - 52.0 % 30.6  32.9  38.0   Platelets 150 - 400 K/uL 210  219  66       Latest Ref Rng & Units 09/23/2022    5:11 AM 09/22/2022    7:08 PM 09/16/2022    5:17 AM  CMP  Glucose 70 - 99 mg/dL 204  162  159   BUN 8 - 23 mg/dL 22  16  23    Creatinine 0.61 - 1.24 mg/dL 1.07  1.05  1.05   Sodium 135 - 145 mmol/L 131  131  135   Potassium 3.5 - 5.1 mmol/L 4.0  4.0  4.1   Chloride 98 - 111 mmol/L 98  99  99   CO2 22 - 32 mmol/L 22  21  26    Calcium 8.9 - 10.3 mg/dL 8.3  8.3  8.8   Total Protein 6.5 - 8.1 g/dL 7.2     Total Bilirubin 0.3 - 1.2 mg/dL 0.9     Alkaline Phos 38 - 126 U/L 126     AST 15 - 41 U/L 35     ALT 0 - 44 U/L 33          Micro Results Recent Results (from the past 240 hour(s))  Resp panel by RT-PCR (RSV, Flu A&B,  Covid) Anterior Nasal Swab     Status: None   Collection Time: 09/15/22   5:10 AM   Specimen: Anterior Nasal Swab  Result Value Ref Range Status   SARS Coronavirus 2 by RT PCR NEGATIVE NEGATIVE Final    Comment: (NOTE) SARS-CoV-2 target nucleic acids are NOT DETECTED.  The SARS-CoV-2 RNA is generally detectable in upper respiratory specimens during the acute phase of infection. The lowest concentration of SARS-CoV-2 viral copies this assay can detect is 138 copies/mL. A negative result does not preclude SARS-Cov-2 infection and should not be used as the sole basis for treatment or other patient management decisions. A negative result may occur with  improper specimen collection/handling, submission of specimen other than nasopharyngeal swab, presence of viral mutation(s) within the areas targeted by this assay, and inadequate number of viral copies(<138 copies/mL). A negative result must be combined with clinical observations, patient history, and epidemiological information. The expected result is Negative.  Fact Sheet for Patients:  EntrepreneurPulse.com.au  Fact Sheet for Healthcare Providers:  IncredibleEmployment.be  This test is no t yet approved or cleared by the [place] FDA and  has been authorized for detection and/or diagnosis of SARS-CoV-2 by FDA under an Emergency Use Authorization (EUA). This EUA will remain  in effect (meaning this test can be used) for the duration of the COVID-19 declaration under Section 564(b)(1) of the Act, 21 U.S.C.section 360bbb-3(b)(1), unless the authorization is terminated  or revoked sooner.       Influenza A by PCR NEGATIVE NEGATIVE Final   Influenza B by PCR NEGATIVE NEGATIVE Final    Comment: (NOTE) The Xpert Xpress SARS-CoV-2/FLU/RSV plus assay is intended as an aid in the diagnosis of influenza from Nasopharyngeal swab specimens and should not be used as a sole basis for treatment. Nasal washings and aspirates are unacceptable for Xpert Xpress  SARS-CoV-2/FLU/RSV testing.  Fact Sheet for Patients: EntrepreneurPulse.com.au  Fact Sheet for Healthcare Providers: IncredibleEmployment.be  This test is not yet approved or cleared by the [place] FDA and has been authorized for detection and/or diagnosis of SARS-CoV-2 by FDA under an Emergency Use Authorization (EUA). This EUA will remain in effect (meaning this test can be used) for the duration of the COVID-19 declaration under Section 564(b)(1) of the Act, 21 U.S.C. section 360bbb-3(b)(1), unless the authorization is terminated or revoked.     Resp Syncytial Virus by PCR NEGATIVE NEGATIVE Final    Comment: (NOTE) Fact Sheet for Patients: EntrepreneurPulse.com.au  Fact Sheet for Healthcare Providers: IncredibleEmployment.be  This test is not yet approved or cleared by the [place] FDA and has been authorized for detection and/or diagnosis of SARS-CoV-2 by FDA under an Emergency Use Authorization (EUA). This EUA will remain in effect (meaning this test can be used) for the duration of the COVID-19 declaration under Section 564(b)(1) of the Act, 21 U.S.C. section 360bbb-3(b)(1), unless the authorization is terminated or revoked.  Performed at [facility], [place] [address] [place], [place] [address]   Resp panel by RT-PCR (RSV, Flu A&B, Covid) Anterior Nasal Swab     Status: None   Collection Time: 09/22/22  7:14 PM   Specimen: Anterior Nasal Swab  Result Value Ref Range Status   SARS Coronavirus 2 by RT PCR NEGATIVE NEGATIVE Final    Comment: (NOTE) SARS-CoV-2 target nucleic acids are NOT DETECTED.  The SARS-CoV-2 RNA is generally detectable in upper respiratory specimens during the acute phase of infection. The lowest concentration of SARS-CoV-2 viral copies this assay  can detect is 138 copies/mL. A negative result does not preclude SARS-Cov-2 infection and should  not be used as the sole basis for treatment or other patient management decisions. A negative result may occur with  improper specimen collection/handling, submission of specimen other than nasopharyngeal swab, presence of viral mutation(s) within the areas targeted by this assay, and inadequate number of viral copies(<138 copies/mL). A negative result must be combined with clinical observations, patient history, and epidemiological information. The expected result is Negative.  Fact Sheet for Patients:  EntrepreneurPulse.com.au  Fact Sheet for Healthcare Providers:  IncredibleEmployment.be  This test is no t yet approved or cleared by the [place] FDA and  has been authorized for detection and/or diagnosis of SARS-CoV-2 by FDA under an Emergency Use Authorization (EUA). This EUA will remain  in effect (meaning this test can be used) for the duration of the COVID-19 declaration under Section 564(b)(1) of the Act, 21 U.S.C.section 360bbb-3(b)(1), unless the authorization is terminated  or revoked sooner.       Influenza A by PCR NEGATIVE NEGATIVE Final   Influenza B by PCR NEGATIVE NEGATIVE Final    Comment: (NOTE) The Xpert Xpress SARS-CoV-2/FLU/RSV plus assay is intended as an aid in the diagnosis of influenza from Nasopharyngeal swab specimens and should not be used as a sole basis for treatment. Nasal washings and aspirates are unacceptable for Xpert Xpress SARS-CoV-2/FLU/RSV testing.  Fact Sheet for Patients: EntrepreneurPulse.com.au  Fact Sheet for Healthcare Providers: IncredibleEmployment.be  This test is not yet approved or cleared by the [place] FDA and has been authorized for detection and/or diagnosis of SARS-CoV-2 by FDA under an Emergency Use Authorization (EUA). This EUA will remain in effect (meaning this test can be used) for the duration of the COVID-19 declaration under  Section 564(b)(1) of the Act, 21 U.S.C. section 360bbb-3(b)(1), unless the authorization is terminated or revoked.     Resp Syncytial Virus by PCR NEGATIVE NEGATIVE Final    Comment: (NOTE) Fact Sheet for Patients: EntrepreneurPulse.com.au  Fact Sheet for Healthcare Providers: IncredibleEmployment.be  This test is not yet approved or cleared by the [place] FDA and has been authorized for detection and/or diagnosis of SARS-CoV-2 by FDA under an Emergency Use Authorization (EUA). This EUA will remain in effect (meaning this test can be used) for the duration of the COVID-19 declaration under Section 564(b)(1) of the Act, 21 U.S.C. section 360bbb-3(b)(1), unless the authorization is terminated or revoked.  Performed at [facility], [address], [place], [place] [address]   MRSA Next Gen by PCR, Nasal     Status: None   Collection Time: 09/23/22  1:15 AM   Specimen: Nasal Mucosa; Nasal Swab  Result Value Ref Range Status   MRSA by PCR Next Gen NOT DETECTED NOT DETECTED Final    Comment: (NOTE) The GeneXpert MRSA Assay (FDA approved for NASAL specimens only), is one component of a comprehensive MRSA colonization surveillance program. It is not intended to diagnose MRSA infection nor to guide or monitor treatment for MRSA infections. Test performance is not FDA approved in patients less than 84 years old. Performed at [facility], [address] [place], [place] [address]     Radiology Reports DG CHEST PORT 1 VIEW  Result Date: 09/23/2022 CLINICAL DATA:  Shortness of breath. EXAM: PORTABLE CHEST 1 VIEW COMPARISON:  09/22/2022 and CT chest 09/15/2022. FINDINGS: Trachea is midline. Heart is enlarged. Coarsened interstitial markings bilaterally with post treatment scarring and fiducial markers in the right upper lobe.  Findings are unchanged from 09/22/2022. No pleural fluid. IMPRESSION: 1. No acute findings. 2. Post treatment scarring in the  right upper lobe. Known metastatic lesions in the right hemithorax are better seen on recent cross-sectional imaging. 3. Coarsened interstitial pulmonary markings, as on 09/15/2022. If further evaluation is desired, outpatient high-resolution chest CT without contrast could be performed. Electronically Signed   By: Lorin Picket M.D.   On: 09/23/2022 08:45   DG Chest Port 1 View  Result Date: 09/22/2022 CLINICAL DATA:  Shortness of breath and chest pain EXAM: PORTABLE CHEST 1 VIEW COMPARISON:  CT and x-ray 09/15/2022.  Older exams as well. FINDINGS: Extensive interstitial changes again identified, similar to previous. No pneumothorax, effusion or consolidation. Stable cardiopericardial silhouette. Overlapping cardiac leads. Surgical clips overlie the upper right hemithorax. Degenerative changes along the spine and shoulders. IMPRESSION: No significant interval change. Stable diffuse interstitial changes single lungs, possibly fibrosis. Please correlate with prior CT Electronically Signed   By: Jill Side M.D.   On: 09/22/2022 19:21    SIGNED: Deatra James, MD, FHM. > 75 minutes of critical care time was spent in evaluating patient, stabilizing the patient, reviewing medical records, labs, drawn plan of care, discussing with consultants and ICU staff  [facility],  Pager (please use [email or web address] to page/text) Please use Epic Secure Chat for non-urgent communication (7AM-7PM)  If 7PM-7AM, please contact night-coverage [email or web address], 09/23/2022, 12:45 PM

## 2022-09-23 NOTE — Telephone Encounter (Signed)
Wife cancelled appt this week due to his hospitalization. Appts cancelled.

## 2022-09-23 NOTE — Hospital Course (Addendum)
Daniel Ball is a 67 y.o. male with medical history significant of atrial fibrillation, CAD, hypertension, hyperlipidemia, COPD, GERD lung cancer on active chemotherapy and radiation (follows with Dr. Earlie Server) and class III obesity who presents to the emergency department due to shortness of breath on exertion and nonproductive cough Patient was recently admitted to [facility] from 5/26 to 12/27 due to atrial fibrillation with RVR and this was treated with Cardizem.  He was discharged home and was doing fine for few days, but this worsened again and presented to the ED for further evaluation and management. Patient complained of some chest discomfort which occurs mostly when he has palpitations.   ED Course:  Patient was tachypneic and tachycardic, BP was 146/73, O2 sat was 96% on NRB, was transitioned to BiPAP and O2 sats increased to 100% at FiO2 of 50%..  Workup in the ED showed leukocytosis and normocytic anemia.  BMP was normal except for hyponatremia and hyperglycemia.  BNP 333, troponin x 2 was negative, magnesium 2.1.   Influenza A, B, SARS coronavirus 2, RSV was negative. Chest x-ray showed no  significant interval change. Stable diffuse interstitial changes single lungs, possibly fibrosis. Patient was started on Cardizem drip, cardiology on-call at [facility] was consulted and states that patient can stay here at [facility].   Hospitalist was asked to admit patient for further evaluation and management.     Overall no improvement in acute respiratory failure and hypoxia was noted despite all aggressive treatments  Patient and family decided not to pursue chemo and radiation therapy anymore, they have decided to return home under hospice care.    Acute respiratory failure with hypoxia -No improvement -Cannot tolerate BiPAP, has been on high flow oxygen by nasal cannula up to 20 L and Ventimask -On BiPAP was satting 97%, currently satting 88%  Discussed with pulmonary Dr. Melvyn Novas who has  been following closely  With history of lung cancer follows with Dr. Earlie Server Currently on radiation therapy -patient has decided not to pursue radiation therapy at this point  -> BiPAP-recommended, but he cannot tolerate it due to being claustrophobic -last  ABG    Component Value Date/Time   PHART 7.49 (H) 09/23/2022 0905   PCO2ART 35 09/23/2022 0905   PO2ART 136 (H) 09/23/2022 0905   HCO3 26.8 09/23/2022 0905   TCO2 31 11/02/2019 0858   O2SAT 99.2 09/23/2022 0905      -The patient and the family confirmed that he is not O2 dependent at baseline -Had recent hospitalization last week at [facility] for similar  -Discussed the case with pulmonary Dr. Mertha Finders..  Recommending BiPAP, ABGs, chest x-ray, high flow oxygen for now, breathing treatments (switch to Xopenex/Atrovent due to A-fib-tachyarrhythmia) High-dose IV steroids >>> which will be discontinued upon discharge  -Patient requesting not to be transferred   Final discussion with family and palliative care team: Patient to return home under hospice care  Elevated BNP Shortness of admit acute respiratory distress with elevated BNP 333 (this was 220.9 about 8 days ago) Echocardiogram done on 12/26 showed LVEF of 55 to 60%.  No RWMA.  Mild LVH.  LV diastolic parameters indeterminate. Continue total input/output, daily weights and fluid restriction EKG >>> and A-fib  Echocardiogram >>> reviewed Cardiology was consulted, Dr. Domenic Polite following appreciate input    Atrial fibrillation with RVR On Cardizem drip -back on p.o. Cardizem -Continue Eliquis    Leukocytosis possibly reactive WBC 12.3 >>> 11.0   Type 2 diabetes mellitus with hyperglycemia Hemoglobin  A1c about 8 days ago was 7.1 Continue Semglee 5>>10  units nightly and adjust dose accordingly -Resume metformin   Hyponatremia -resolved  Mixed hyperlipidemia Continue Crestor   COPD Continue Xopenex   GERD Continue Protonix   CAD Continue Plavix, Crestor    Goals of care: Continue discussion with palliative care, DNR/DNI now Pursuing hospice at home   Class III obesity BMI 40.53; continue lifestyle and diet modification   Lung cancer Metastatic stage IV NSCLC on carboplatin Taxol Libtayo X1 cycle status post XRT 2023 (destructive lesion right rib) + recurrence multifocal thoracic mets follows with Dr. Earlie Server (recent OV 09/08/2022)  Discussed with Dr. Earlie Server on 09/23/2022 --he does not have much to add Patient has decided to cease his radiation therapy at this point   After extensive discussion with palliative care team the patient and family has decided to return home under hospice care

## 2022-09-24 ENCOUNTER — Ambulatory Visit: Payer: Medicare Other

## 2022-09-24 ENCOUNTER — Other Ambulatory Visit: Payer: Medicare Other

## 2022-09-24 ENCOUNTER — Ambulatory Visit: Payer: Medicare Other | Admitting: Internal Medicine

## 2022-09-24 DIAGNOSIS — Z66 Do not resuscitate: Secondary | ICD-10-CM | POA: Diagnosis not present

## 2022-09-24 DIAGNOSIS — Z515 Encounter for palliative care: Secondary | ICD-10-CM | POA: Diagnosis not present

## 2022-09-24 DIAGNOSIS — C3490 Malignant neoplasm of unspecified part of unspecified bronchus or lung: Secondary | ICD-10-CM | POA: Diagnosis not present

## 2022-09-24 DIAGNOSIS — Z7189 Other specified counseling: Secondary | ICD-10-CM | POA: Diagnosis not present

## 2022-09-24 DIAGNOSIS — I4891 Unspecified atrial fibrillation: Secondary | ICD-10-CM | POA: Diagnosis not present

## 2022-09-24 LAB — GLUCOSE, CAPILLARY
Glucose-Capillary: 245 mg/dL — ABNORMAL HIGH (ref 70–99)
Glucose-Capillary: 284 mg/dL — ABNORMAL HIGH (ref 70–99)
Glucose-Capillary: 266 mg/dL — ABNORMAL HIGH (ref 70–99)
Glucose-Capillary: 223 mg/dL — ABNORMAL HIGH (ref 70–99)

## 2022-09-24 LAB — CBC
HCT: 32.7 % — ABNORMAL LOW (ref 39.0–52.0)
Hemoglobin: 10.9 g/dL — ABNORMAL LOW (ref 13.0–17.0)
MCH: 30.9 pg (ref 26.0–34.0)
MCHC: 33.3 g/dL (ref 30.0–36.0)
MCV: 92.6 fL (ref 80.0–100.0)
Platelets: 178 10*3/uL (ref 150–400)
RBC: 3.53 MIL/uL — ABNORMAL LOW (ref 4.22–5.81)
RDW: 15.4 % (ref 11.5–15.5)
WBC: 11 10*3/uL — ABNORMAL HIGH (ref 4.0–10.5)
nRBC: 0 % (ref 0.0–0.2)

## 2022-09-24 LAB — COMPREHENSIVE METABOLIC PANEL
ALT: 33 U/L (ref 0–44)
AST: 33 U/L (ref 15–41)
Albumin: 2.8 g/dL — ABNORMAL LOW (ref 3.5–5.0)
Alkaline Phosphatase: 134 U/L — ABNORMAL HIGH (ref 38–126)
Anion gap: 10 (ref 5–15)
BUN: 28 mg/dL — ABNORMAL HIGH (ref 8–23)
CO2: 23 mmol/L (ref 22–32)
Calcium: 8.5 mg/dL — ABNORMAL LOW (ref 8.9–10.3)
Chloride: 97 mmol/L — ABNORMAL LOW (ref 98–111)
Creatinine, Ser: 0.99 mg/dL (ref 0.61–1.24)
GFR, Estimated: >60 mL/min (ref >60)
Glucose, Bld: 252 mg/dL — ABNORMAL HIGH (ref 70–99)
Potassium: 3.9 mmol/L (ref 3.5–5.1)
Sodium: 130 mmol/L — ABNORMAL LOW (ref 135–145)
Total Bilirubin: 0.7 mg/dL (ref 0.3–1.2)
Total Protein: 7.6 g/dL (ref 6.5–8.1)

## 2022-09-24 MED ORDER — MORPHINE SULFATE (PF) 2 MG/ML IV SOLN
2.0000 mg | INTRAVENOUS | Status: DC | PRN
  Administered 2022-09-24: 2 mg via INTRAVENOUS
  Administered 2022-09-25 (×2): 4 mg via INTRAVENOUS
  Filled 2022-09-24 (×3): qty 2
  Filled 2022-09-24: qty 1

## 2022-09-24 MED ORDER — INSULIN GLARGINE-YFGN 100 UNIT/ML ~~LOC~~ SOLN
10.0000 [IU] | Freq: Every day | SUBCUTANEOUS | Status: DC
  Administered 2022-09-24: 10 [IU] via SUBCUTANEOUS
  Filled 2022-09-24 (×2): qty 0.1

## 2022-09-24 MED ORDER — FUROSEMIDE 10 MG/ML IJ SOLN
40.0000 mg | Freq: Once | INTRAMUSCULAR | Status: AC
  Administered 2022-09-24: 40 mg via INTRAVENOUS
  Filled 2022-09-24: qty 4

## 2022-09-24 MED ORDER — DILTIAZEM HCL-DEXTROSE 125-5 MG/125ML-% IV SOLN (PREMIX)
5.0000 mg/h | INTRAVENOUS | Status: DC
  Administered 2022-09-24: 12.5 mg/h via INTRAVENOUS
  Filled 2022-09-24: qty 125

## 2022-09-24 MED ORDER — DILTIAZEM HCL 60 MG PO TABS
90.0000 mg | ORAL_TABLET | Freq: Four times a day (QID) | ORAL | Status: DC
  Administered 2022-09-24 – 2022-09-25 (×5): 90 mg via ORAL
  Filled 2022-09-24 (×6): qty 1

## 2022-09-24 NOTE — Progress Notes (Signed)
Patient (and family) does not want him to wear BIPAP tonight unless absolutely needed. Patient verbalizes understanding and is open to wearing if needed, but he did not tolerate well last night and wants to try just wearing the HFNC and PRB mask tonight. Unit still at bedside if needed.

## 2022-09-24 NOTE — Progress Notes (Signed)
Progress Note  Patient Name: Daniel Ball Date of Encounter: 09/24/2022  Primary Cardiologist: [name], MD  Subjective   Patient in bedside chair on BiPAP.  No chest pain or palpitations reported.  Inpatient Medications    Scheduled Meds:  ALPRAZolam  0.5 mg Oral QHS   apixaban  5 mg Oral BID   budesonide (PULMICORT) nebulizer solution  0.25 mg Nebulization BID   Chlorhexidine Gluconate Cloth  6 each Topical Q0600   clopidogrel  75 mg Oral Q breakfast   insulin aspart  0-15 Units Subcutaneous TID WC   insulin glargine-yfgn  10 Units Subcutaneous QHS   ipratropium  0.5 mg Nebulization TID   levalbuterol  0.63 mg Nebulization TID   methylPREDNISolone (SOLU-MEDROL) injection  125 mg Intravenous Q12H   mouth rinse  15 mL Mouth Rinse 4 times per day   pantoprazole  40 mg Oral BID AC   rosuvastatin  40 mg Oral Daily   Continuous Infusions:  diltiazem (CARDIZEM) infusion 12.5 mg/hr (09/24/22 0745)   PRN Meds: ALPRAZolam **AND** ALPRAZolam, guaiFENesin, levalbuterol, morphine injection, mouth rinse, prochlorperazine   Vital Signs    Vitals:   09/24/22 0600 09/24/22 0700 09/24/22 0717 09/24/22 0800  BP: (!) 132/104 133/78  (!) 128/93  Pulse: 96 94 98 86  Resp: (!) _0 Temp:   98.2 F (36.8 C)   TempSrc:   Axillary   SpO2: 96% 97% 97% 93%  Weight:      Height:        Intake/Output Summary (Last 24 hours) at 09/24/2022 0857 Last data filed at 09/24/2022 0745 Gross per 24 hour  Intake 350.66 ml  Output 700 ml  Net -349.34 ml   Filed Weights   09/23/22 0100 09/23/22 0400 09/24/22 0500  Weight: 107.1 kg 107.1 kg 106.8 kg    Telemetry    Atrial fibrillation with heart rate 90-100.  Personally reviewed.  ECG    An ECG dated 09/23/2022 was personally reviewed today and demonstrated:  Atrial fibrillation with nonspecific ST changes.  Physical Exam   GEN: No acute distress.  On BiPAP. Neck: No JVD. Cardiac: Irregularly irregular without gallop.   Respiratory: Coarse rhonchorous breath sounds. GI: Soft, nontender, bowel sounds present. MS: No edema; No deformity. Neuro:  Nonfocal. Psych: Alert and oriented x 3. Normal affect.  Labs    Chemistry Recent Labs  Lab 09/22/22 1908 09/23/22 0511 09/24/22 0503  NA 131* 131* 130*  K 4.0 4.0 3.9  CL 99 98 97*  CO2 21* 22 23  GLUCOSE 162* 204* 252*  BUN 16 22 28*  CREATININE 1.05 1.07 0.99  CALCIUM 8.3* 8.3* 8.5*  PROT  --  7.2 7.6  ALBUMIN  --  2.8* 2.8*  AST  --  35 33  ALT  --  33 33  ALKPHOS  --  126 134*  BILITOT  --  0.9 0.7  GFRNONAA >60 >60 >60  ANIONGAP _1 Hematology Recent Labs  Lab 09/22/22 1908 09/23/22 0511 09/24/22 0503  WBC 12.3* 13.2* 11.0*  RBC 3.59* 3.35* 3.53*  HGB 11.1* 10.4* 10.9*  HCT 32.9* 30.6* 32.7*  MCV 91.6 91.3 92.6  MCH 30.9 31.0 30.9  MCHC 33.7 34.0 33.3  RDW 15.5 15.6* 15.4  PLT 219 210 178    Cardiac Enzymes Recent Labs  Lab 09/15/22 0502 09/15/22 0658 09/22/22 1907 09/22/22 2108  TROPONINIHS _2 BNP Recent Labs  Lab 09/22/22 1909  BNP 333.0*     Radiology    DG CHEST PORT 1 VIEW  Result Date: 09/23/2022 CLINICAL DATA:  Shortness of breath. EXAM: PORTABLE CHEST 1 VIEW COMPARISON:  09/22/2022 and CT chest 09/15/2022. FINDINGS: Trachea is midline. Heart is enlarged. Coarsened interstitial markings bilaterally with post treatment scarring and fiducial markers in the right upper lobe. Findings are unchanged from 09/22/2022. No pleural fluid. IMPRESSION: 1. No acute findings. 2. Post treatment scarring in the right upper lobe. Known metastatic lesions in the right hemithorax are better seen on recent cross-sectional imaging. 3. Coarsened interstitial pulmonary markings, as on 09/15/2022. If further evaluation is desired, outpatient high-resolution chest CT without contrast could be performed. Electronically Signed   By: Lorin Picket M.D.   On: 09/23/2022 08:45   DG Chest Port 1 View  Result Date:  09/22/2022 CLINICAL DATA:  Shortness of breath and chest pain EXAM: PORTABLE CHEST 1 VIEW COMPARISON:  CT and x-ray 09/15/2022.  Older exams as well. FINDINGS: Extensive interstitial changes again identified, similar to previous. No pneumothorax, effusion or consolidation. Stable cardiopericardial silhouette. Overlapping cardiac leads. Surgical clips overlie the upper right hemithorax. Degenerative changes along the spine and shoulders. IMPRESSION: No significant interval change. Stable diffuse interstitial changes single lungs, possibly fibrosis. Please correlate with prior CT Electronically Signed   By: Jill Side M.D.   On: 09/22/2022 19:21    Cardiac Studies   Echocardiogram 09/15/2022:  1. Left ventricular ejection fraction, by estimation, is 55 to 60%. The  left ventricle has normal function. The left ventricle has no regional  wall motion abnormalities. There is mild left ventricular hypertrophy.  Left ventricular diastolic parameters  are indeterminate.   2. Right ventricular systolic function is normal. The right ventricular  size is normal. Tricuspid regurgitation signal is inadequate for assessing  PA pressure.   3. A small pericardial effusion is present.   4. The mitral valve was not well visualized. No evidence of mitral valve  regurgitation. No evidence of mitral stenosis.   5. The aortic valve is grossly normal. There is mild thickening of the  aortic valve. Aortic valve regurgitation is not visualized. No aortic  stenosis is present.   Assessment & Plan    1.  Persistent atrial fibrillation presenting with RVR, CHA2DS2-VASc score is 3.  Heart rate control better on intravenous diltiazem at 15 mg/h.  He is on Eliquis for stroke prophylaxis.  2.  Metastatic stage IV NSCLC most recently managed with carboplatin, Taxol, Libtayo as well as XRT.  He met with palliative care yesterday and decision made to stop further treatment.  He is currently DNR.  Now with hypoxic respiratory  failure requiring BiPAP.  Also evaluated by Pulmonary/CCM.  3.  CAD status post arthrectomy and DES to the LAD in 2021, on Plavix and Crestor.  No active angina and cardiac enzymes normal.  Chart reviewed since consultation yesterday.  Initial plan had been transfer to [facility] per hospitalist team to continue further oncology treatment.  Patient seen by palliative care here and now declines further treatment and is DNR.  Not clear that he necessarily needs to go to [facility] in this case, needs to be clarified with primary team regarding treatment plan.  From a cardiac perspective, we will attempt switch from IV Cardizem to divided p.o. Cardizem 90 mg every 6 hours.  If this is effective can switch subsequently to Cardizem CD 360 mg daily.  Addition of bisoprolol could otherwise be  considered if necessary.  We will continue to follow.  Signed, Rozann Lesches, MD  09/24/2022, 8:57 AM

## 2022-09-24 NOTE — Evaluation (Signed)
Clinical/Bedside Swallow Evaluation Patient Details  Name: Daniel Ball MRN: [id number] Date of Birth: [birth year]  Today's Date: 09/24/2022 Time: SLP Start Time (ACUTE ONLY): 35 SLP Stop Time (ACUTE ONLY): 1420 SLP Time Calculation (min) (ACUTE ONLY): 37 min  Past Medical History:  Past Medical History:  Diagnosis Date   Arthritis    Atrial fibrillation ([place]) 11/02/2019   COPD (chronic obstructive pulmonary disease) (HCC)    Coronary artery disease    a. 12/2019: s/p orbital atherectomy and DES placement to proximal/mid LAD.    Diabetes mellitus without complication ([place])    GERD (gastroesophageal reflux disease)    History of radiation therapy 09/11/20, 09/16/21, 09/18/20    Right lung- SBRT Dr. Sondra Come    Hyperlipidemia    Hypertension    hx HBP - MEDS DC'D 69 YRS since BP has been in normal range   Impingement syndrome of shoulder    left   Non-small cell cancer of right lung (HCC)    Pericardial effusion    Shortness of breath    with exertion   Sleep apnea    uses a cpap   Spinal stenosis    Thrombocytopenia [facility])    Past Surgical History:  Past Surgical History:  Procedure Laterality Date   BRONCHIAL BIOPSY  04/09/2020   Procedure: BRONCHIAL BIOPSIES;  Surgeon: Collene Gobble, MD;  Location: [place];  Service: Pulmonary;;   BRONCHIAL BIOPSY  10/20/2021   Procedure: BRONCHIAL BIOPSIES;  Surgeon: Collene Gobble, MD;  Location: [place];  Service: Pulmonary;;   BRONCHIAL BRUSHINGS  04/09/2020   Procedure: BRONCHIAL BRUSHINGS;  Surgeon: Collene Gobble, MD;  Location: [facility] ENDOSCOPY;  Service: Pulmonary;;   BRONCHIAL BRUSHINGS  07/30/2020   Procedure: BRONCHIAL BRUSHINGS;  Surgeon: Collene Gobble, MD;  Location: [facility] ENDOSCOPY;  Service: Pulmonary;;   BRONCHIAL BRUSHINGS  10/20/2021   Procedure: BRONCHIAL BRUSHINGS;  Surgeon: Collene Gobble, MD;  Location: [facility] ENDOSCOPY;  Service: Pulmonary;;   BRONCHIAL NEEDLE ASPIRATION BIOPSY  04/09/2020   Procedure:  BRONCHIAL NEEDLE ASPIRATION BIOPSIES;  Surgeon: Collene Gobble, MD;  Location: [facility] ENDOSCOPY;  Service: Pulmonary;;   BRONCHIAL NEEDLE ASPIRATION BIOPSY  07/30/2020   Procedure: BRONCHIAL NEEDLE ASPIRATION BIOPSIES;  Surgeon: Collene Gobble, MD;  Location: [facility] ENDOSCOPY;  Service: Pulmonary;;   BRONCHIAL NEEDLE ASPIRATION BIOPSY  10/20/2021   Procedure: BRONCHIAL NEEDLE ASPIRATION BIOPSIES;  Surgeon: Collene Gobble, MD;  Location: [place];  Service: Pulmonary;;   BRONCHIAL WASHINGS  04/09/2020   Procedure: BRONCHIAL WASHINGS;  Surgeon: Collene Gobble, MD;  Location: [place];  Service: Pulmonary;;   BRONCHIAL WASHINGS  07/30/2020   Procedure: BRONCHIAL WASHINGS;  Surgeon: Collene Gobble, MD;  Location: [place];  Service: Pulmonary;;   BRONCHIAL WASHINGS  10/20/2021   Procedure: BRONCHIAL WASHINGS;  Surgeon: Collene Gobble, MD;  Location: [facility] ENDOSCOPY;  Service: Pulmonary;;   CORONARY ATHERECTOMY N/A 01/18/2020   Procedure: CORONARY ATHERECTOMY;  Surgeon: Nelva Bush, MD;  Location: [place] CV LAB;  Service: Cardiovascular;  Laterality: N/A;   CORONARY STENT INTERVENTION  01/18/2020   CORONARY STENT INTERVENTION N/A 01/18/2020   Procedure: CORONARY STENT INTERVENTION;  Surgeon: Nelva Bush, MD;  Location: [place] CV LAB;  Service: Cardiovascular;  Laterality: N/A;   EYE SURGERY Left    FIDUCIAL MARKER PLACEMENT  04/09/2020   Procedure: FIDUCIAL MARKER PLACEMENT;  Surgeon: Collene Gobble, MD;  Location: [facility] ENDOSCOPY;  Service: Pulmonary;;   FIDUCIAL MARKER PLACEMENT  10/20/2021   Procedure:  FIDUCIAL MARKER PLACEMENT;  Surgeon: Collene Gobble, MD;  Location: [facility] ENDOSCOPY;  Service: Pulmonary;;   FINE NEEDLE ASPIRATION  04/09/2020   Procedure: FINE NEEDLE ASPIRATION (FNA) LINEAR;  Surgeon: Collene Gobble, MD;  Location: [place] ENDOSCOPY;  Service: Pulmonary;;   HAND SURGERY Right    thumb   INTRAVASCULAR PRESSURE WIRE/FFR STUDY N/A 11/02/2019   Procedure: INTRAVASCULAR  PRESSURE WIRE/FFR STUDY;  Surgeon: Nelva Bush, MD;  Location: [place] CV LAB;  Service: Cardiovascular;  Laterality: N/A;   INTRAVASCULAR ULTRASOUND/IVUS N/A 01/18/2020   Procedure: Intravascular Ultrasound/IVUS;  Surgeon: Nelva Bush, MD;  Location: [place] CV LAB;  Service: Cardiovascular;  Laterality: N/A;   LUMBAR LAMINECTOMY/DECOMPRESSION MICRODISCECTOMY N/A 02/07/2014   Procedure: LUMBAR DECOMPRESSION Lumbar one-Lumbar five;  Surgeon: [name], MD;  Location: [facility];  Service: Orthopedics;  Laterality: N/A;   RIGHT/LEFT HEART CATH AND CORONARY ANGIOGRAPHY N/A 11/02/2019   Procedure: RIGHT/LEFT HEART CATH AND CORONARY ANGIOGRAPHY;  Surgeon: Nelva Bush, MD;  Location: [place] CV LAB;  Service: Cardiovascular;  Laterality: N/A;   SHOULDER ARTHROSCOPY WITH SUBACROMIAL DECOMPRESSION Left 04/12/2014   Procedure: LEFT SHOULDER ARTHROSCOPY WITH SUBACROMIAL DECOMPRESSION AND LABRAL DEBRIDEMENT, ROTATOR CUFF DEBRIDEMENT, BICEPS DEBRIDEMENT;  Surgeon: [name], MD;  Location: [facility];  Service: Orthopedics;  Laterality: Left;   VIDEO BRONCHOSCOPY WITH ENDOBRONCHIAL NAVIGATION N/A 04/09/2020   Procedure: VIDEO BRONCHOSCOPY WITH ENDOBRONCHIAL NAVIGATION;  Surgeon: Collene Gobble, MD;  Location: [place] ENDOSCOPY;  Service: Pulmonary;  Laterality: N/A;   VIDEO BRONCHOSCOPY WITH ENDOBRONCHIAL NAVIGATION Right 07/30/2020   Procedure: VIDEO BRONCHOSCOPY WITH ENDOBRONCHIAL NAVIGATION;  Surgeon: Collene Gobble, MD;  Location: [place] ENDOSCOPY;  Service: Pulmonary;  Laterality: Right;   VIDEO BRONCHOSCOPY WITH ENDOBRONCHIAL ULTRASOUND N/A 04/09/2020   Procedure: VIDEO BRONCHOSCOPY WITH ENDOBRONCHIAL ULTRASOUND;  Surgeon: Collene Gobble, MD;  Location: [facility] ENDOSCOPY;  Service: Pulmonary;  Laterality: N/A;   VIDEO BRONCHOSCOPY WITH RADIAL ENDOBRONCHIAL ULTRASOUND  04/09/2020   Procedure: VIDEO BRONCHOSCOPY WITH RADIAL ENDOBRONCHIAL ULTRASOUND;  Surgeon: Collene Gobble, MD;  Location: [facility]  ENDOSCOPY;  Service: Pulmonary;;   VIDEO BRONCHOSCOPY WITH RADIAL ENDOBRONCHIAL ULTRASOUND  10/20/2021   Procedure: VIDEO BRONCHOSCOPY WITH RADIAL ENDOBRONCHIAL ULTRASOUND;  Surgeon: Collene Gobble, MD;  Location: [facility] ENDOSCOPY;  Service: Pulmonary;;   HPI:  Daniel Ball is a 67 y.o. male with medical history significant of atrial fibrillation, CAD, hypertension, hyperlipidemia, COPD, GERD lung cancer on active chemotherapy and radiation (follows with Dr. Earlie Server) and class III obesity who presents to the emergency department due to shortness of breath on exertion and nonproductive cough  Patient was recently admitted to [facility] from 5/26 to 12/27 due to atrial fibrillation with RVR and this was treated with Cardizem.  He was discharged home and was doing fine for few days, but this worsened again and presented to the ED for further evaluation and management.   BSE requested    Assessment / Plan / Recommendation  Clinical Impression  Clinical swallowing evaluation completed while Pt was sitting upright in the chair. Pt is on 10L nonrebreather which he removed on and off to take PO trials; O2 did occasionally dip into the mid 80s during trials. Pt consumed ice chips and thin liquids with one cough that was seemingly not related to swallowing. Pt also consumed puree textures and soft textures without overt s/sx of aspiration. Pt reports he uses strategies including slow rate, very small bites and thorough mastication of all food prior to swallowing; SLP reinforced these strategies and  encouraged strategies for energy conservation including taking breaks during meals (and at this point between almost every bite) to breathe, using a very slow rate and focusing on swallowing when eating. Pt is at risk for aspiration secondary to compromised respiratory status, overall deconditioning, and GERD. Recommend upgrade Pt's diet to D3/mech soft with the above strategies (reviewed at length with Pt and family)  and continue with thin liquids. Meds are ok whole with liquids at this time, however, SLP recommends meds whole with puree if Pt begins having difficulty with meds - this recommendation was provided to family. ST will f/u X1 to ensure diet tolerance, thank you for this referral. SLP Visit Diagnosis: Dysphagia, unspecified (R13.10)    Aspiration Risk  Moderate aspiration risk    Diet Recommendation Dysphagia 3 (Mech soft);Thin liquid   Liquid Administration via: Cup;Straw Medication Administration: Whole meds with liquid Supervision: Patient able to self feed Compensations: Minimize environmental distractions;Slow rate;Small sips/bites Postural Changes: Seated upright at 90 degrees    Other  Recommendations Oral Care Recommendations: Oral care BID    Recommendations for follow up therapy are one component of a multi-disciplinary discharge planning process, led by the attending physician.  Recommendations may be updated based on patient status, additional functional criteria and insurance authorization.  Follow up Recommendations No SLP follow up                   Prognosis Prognosis for Safe Diet Advancement: Man[place] Study   General Date of Onset: 09/22/22 HPI: Daniel Ball is a 67 y.o. male with medical history significant of atrial fibrillation, CAD, hypertension, hyperlipidemia, COPD, GERD lung cancer on active chemotherapy and radiation (follows with Dr. Earlie Server) and class III obesity who presents to the emergency department due to shortness of breath on exertion and nonproductive cough  Patient was recently admitted to [facility] from 5/26 to 12/27 due to atrial fibrillation with RVR and this was treated with Cardizem.  He was discharged home and was doing fine for few days, but this worsened again and presented to the ED for further evaluation and management.   BSE requested Type of Study: Bedside Swallow Evaluation Previous Swallow Assessment: none  inchart Diet Prior to this Study: Dysphagia 2 (chopped);Thin liquids Temperature Spikes Noted: No Respiratory Status: Non-rebreather;Nasal cannula History of Recent Intubation: No Behavior/Cognition: Alert;Cooperative;Pleasant mood Oral Cavity Assessment: Within Functional Limits Oral Care Completed by SLP: Recent completion by staff Oral Cavity - Dentition: Dentures, top;Dentures, bottom Vision: Functional for self-feeding Self-Feeding Abilities: Able to feed self Patient Positioning: Upright in chair Volitional Cough: Strong Volitional Swallow: Able to elicit    Oral/Motor/Sensory Function Overall Oral Motor/Sensory Function: Within functional limits   Ice Chips Ice chips: Within functional limits   Thin Liquid Thin Liquid: Within functional limits    Nectar Thick Nectar Thick Liquid: Not tested   Honey Thick Honey Thick Liquid: Not tested   Puree Puree: Within functional limits   Solid     Solid: Within functional limits     Waverley Krempasky H. Roddie Mc, [place] Speech Language Pathologist  Wende Bushy 09/24/2022,2:20 PM

## 2022-09-24 NOTE — Progress Notes (Signed)
Palliative:  HPI:  67 y.o. male  with past medical history of atrial fibrillation, CAD, hypertension, hyperlipidemia, COPD, GERD, stage IV lung cancer on chemotherapy/radiation (follows with Dr. Earlie Server; recent recurrence Nov 2023), sleep apnea uses CPAP admitted on 09/22/2022 with shortness of breath, nonproductive cough, hypoxia requiring BiPAP support complicated by atrial fibrillation RVR requiring diltiazem infusion.   I met again today with Mr. Attia along with his wife, 2 daughters, and granddaughter at bedside. Mr. Acy is sitting up in recliner. He appears to be holding his own and is still able to speak and joke and converse. Breathing may be slightly less labored compared to yesterday. There is some confusion today for Mr. Mellin desire for intubation if needed. I discussed this with Mr. Dorrance and family at bedside. We discussed intubation and what this looks like and expectations that this would not lead to good outcome or quality of life. After explaining intubation further Mr. Maceachern maintains his same opinion he stated yesterday and does NOT desire intubation. He is anxious to return home. We discussed that he would need to decrease oxygen needs to 10L at a minimum to return home. We discussed plan forward to continue steroids, nebs, oxygen, and BiPAP (if needed - he would like to avoid BiPAP as long as possible). I further explained the benefits of BiPAP if needed. They all confirm that there is NO desire to pursue intubation or radiation and therefore no need to transfer from [facility]. They prefer to keep him close so family can more easily be with him. Mr. Frimpong values his quality of life over quantity of life. He desires to have as much quality time with his family as possible. Wife continues to endorse her desire that he does not suffer. We did review how we can offer medication for comfort if his condition were to decline further. Mr. Heady has already expressed his strong  faith and peace when his time comes.   Family also report that he had a difficult night last night. We discussed offering increased range of morphine to assist with improved rest at night. He has tolerated morphine 2 mg IV very well with no confusion or lethargy noted so will allow 2-4 mg as needed.   All questions/concerns addressed. Emotional support provided. Family expressed their appreciation for conversation and clarification.   Exam: Alert, oriented. Sitting up in recliner. No distress. Labored breathing at rest somewhat improved. Moves all extremities.   Plan: - DNR/DNI - No desire for transfer to [facility] - Continue current interventions - Increased morphine range for improved symptom management (especially at nighttime) - Consideration of comfort care if further decline - Hopeful to return home in the coming days - will continue conversation for goals at home if he continues to improve  60 min  Vinie Sill, NP Palliative Medicine Team Pager [phone] (Please see [email or web address] for schedule) Team Phone [phone]    Greater than 50%  of this time was spent counseling and coordinating care related to the above assessment and plan

## 2022-09-24 NOTE — Progress Notes (Signed)
PROGRESS NOTE    Patient: Daniel Ball                            PCP: [name], MD                    DOB: 18-Apr-1956            DOA: 09/22/2022 HYW:[id number]             DOS: 09/24/2022, 12:00 PM   LOS: 1 day   Date of Service: The patient was seen and examined on 09/24/2022  Subjective:   The patient was seen and examined this morning, sitting up in chair, alert oriented following commands- Pleasant family present at bedside Patient again could not tolerate BiPAP overnight, therefore on high flow 15 L of oxygen by nasal cannula and Ventimask -satting 97% -Ordered is improving still on Cardizem drip  Daughter still requesting patient to be transferred to [facility] or [facility]   Brief Narrative:    Daniel Ball is a 67 y.o. male with medical history significant of atrial fibrillation, CAD, hypertension, hyperlipidemia, COPD, GERD lung cancer on active chemotherapy and radiation (follows with Dr. Earlie Server) and class III obesity who presents to the emergency department due to shortness of breath on exertion and nonproductive cough Patient was recently admitted to [facility] from 5/26 to 12/27 due to atrial fibrillation with RVR and this was treated with Cardizem.  He was discharged home and was doing fine for few days, but this worsened again and presented to the ED for further evaluation and management. Patient complained of some chest discomfort which occurs mostly when he has palpitations.   ED Course:  Patient was tachypneic and tachycardic, BP was 146/73, O2 sat was 96% on NRB, was transitioned to BiPAP and O2 sats increased to 100% at FiO2 of 50%..  Workup in the ED showed leukocytosis and normocytic anemia.  BMP was normal except for hyponatremia and hyperglycemia.  BNP 333, troponin x 2 was negative, magnesium 2.1.   Influenza A, B, SARS coronavirus 2, RSV was negative. Chest x-ray showed no  significant interval change. Stable diffuse interstitial changes single lungs, possibly  fibrosis. Patient was started on Cardizem drip, cardiology on-call at [facility] was consulted and states that patient can stay here at [facility].   Hospitalist was asked to admit patient for further evaluation and management.       Assessment & Plan:   Principal Problem:   Atrial fibrillation with RVR (HCC) Active Problems:   Essential hypertension   GERD   COPD (chronic obstructive pulmonary disease) (HCC)   Obesity, Class III, BMI 40-49.9 (morbid obesity) ([place])   Malignant neoplasm of right upper lobe of lung (HCC)   Thrombocytopenia (HCC)   Acute respiratory failure with hypoxia (HCC)   Leukocytosis   Hyponatremia   Elevated brain natriuretic peptide (BNP) level   Type 2 diabetes mellitus with hyperglycemia (HCC)   Mixed hyperlipidemia    Acute respiratory failure with hypoxia -Still could not tolerate BiPAP overnight -Continue on high flow oxygen 15 L, and Ventimask -satting 97%  Discussed with pulmonary Dr. Melvyn Novas   With history of lung cancer follows with Dr. Earlie Server Currently on radiation therapy -patient has decided not to pursue radiation therapy at this point  -> BiPAP-recommended, but he cannot tolerate it due to being claustrophobic -last  ABG    Component Value Date/Time   PHART 7.49 (H)  09/23/2022 0905   PCO2ART 35 09/23/2022 0905   PO2ART 136 (H) 09/23/2022 0905   HCO3 26.8 09/23/2022 0905   TCO2 31 11/02/2019 0858   O2SAT 99.2 09/23/2022 0905      -The patient and the family confirmed that he is not O2 dependent at baseline -Had recent hospitalization last week at [facility] for similar  -Discussed the case with pulmonary Dr. Mertha Finders..  Recommending BiPAP, ABGs, chest x-ray, high flow oxygen for now, breathing treatments (switch to Xopenex/Atrovent due to A-fib-tachyarrhythmia) High-dose IV steroids  -Patient requesting not to be transferred -Family still would want him to be transferred to [facility] or [facility]  Elevated BNP Shortness of admit acute respiratory  distress with elevated BNP 333 (this was 220.9 about 8 days ago) Echocardiogram done on 12/26 showed LVEF of 55 to 60%.  No RWMA.  Mild LVH.  LV diastolic parameters indeterminate. Continue total input/output, daily weights and fluid restriction EKG >>> and A-fib  Echocardiogram >>> Cardiology was consulted, Dr. Domenic Polite following appreciate input    Atrial fibrillation with RVR On Cardizem drip -dissipating to be switched to p.o. by cardiology -Continue Eliquis Continue telemetry   Leukocytosis possibly reactive WBC 12.3 >>> 11.0   Type 2 diabetes mellitus with hyperglycemia Hemoglobin A1c about 8 days ago was 7.1 Continue Semglee 5>>10  units nightly and adjust dose accordingly Continue ISS and hypoglycemia Metformin on hold    Hyponatremia Sodium 131, this is possibly secondary to diuretic effect Continue to monitor sodium levels   Essential hypertension Patient is currently on Cardizem drip Hold further home meds at this time   Mixed hyperlipidemia Continue Crestor   COPD Continue Xopenex   GERD Continue Protonix   CAD Continue Plavix, Crestor   Goals of care: Palliative care will be consulted Full Code    Class III obesity BMI 40.53; continue lifestyle and diet modification   Lung cancer Metastatic stage IV NSCLC on carboplatin Taxol Libtayo X1 cycle status post XRT 2023 (destructive lesion right rib) + recurrence multifocal thoracic mets follows with Dr. Earlie Server (recent OV 09/08/2022)  Discussed with Dr. Earlie Server on 09/23/2022 --he does not have much to add Patient has decided to cease his radiation therapy at this point    ---------------------------------------------------------------------------------------------------------------------------------- Nutritional status:  The patient's BMI is: Body mass index is 40.42 kg/m. I agree with the assessment and plan as outlined        ------------------------------------------------------------------------------------------------------------------------------------------------  DVT prophylaxis:  SCDs Start: 09/23/22 0344 apixaban (ELIQUIS) tablet 5 mg   Code Status:   Code Status: DNR  Family Communication: Discussed with his wife and daughters at bedside Palliative care consulted -Continue discussion regarding goals of care, patient is DNR.[name]  He is not sure about DNI Family and patient decided not to proceed with radiation treatment at this point . -Advance care planning has been discussed.   Admission status:   Status is: Observation The patient remains OBS appropriate and will d/c before 2 midnights.     Procedures:   No admission procedures for hospital encounter.   Antimicrobials:  Anti-infectives (From admission, onward)    None        Medication:   ALPRAZolam  0.5 mg Oral QHS   apixaban  5 mg Oral BID   budesonide (PULMICORT) nebulizer solution  0.25 mg Nebulization BID   Chlorhexidine Gluconate Cloth  6 each Topical Q0600   clopidogrel  75 mg Oral Q breakfast   diltiazem  90 mg Oral Q6H   insulin aspart  0-15  Units Subcutaneous TID WC   insulin glargine-yfgn  10 Units Subcutaneous QHS   ipratropium  0.5 mg Nebulization TID   levalbuterol  0.63 mg Nebulization TID   methylPREDNISolone (SOLU-MEDROL) injection  125 mg Intravenous Q12H   mouth rinse  15 mL Mouth Rinse 4 times per day   pantoprazole  40 mg Oral BID AC   rosuvastatin  40 mg Oral Daily    ALPRAZolam **AND** ALPRAZolam, guaiFENesin, levalbuterol, morphine injection, mouth rinse, prochlorperazine   Objective:   Vitals:   09/24/22 0903 09/24/22 0904 09/24/22 1000 09/24/22 1131  BP:   133/65   Pulse:   95   Resp:   (!) 21   Temp:    (!) 97.3 F (36.3 C)  TempSrc:    Oral  SpO2: 95% 96% 97%   Weight:      Height:        Intake/Output Summary (Last 24 hours) at 09/24/2022 1200 Last data filed at 09/24/2022  0745 Gross per 24 hour  Intake 350.66 ml  Output 700 ml  Net -349.34 ml   Filed Weights   09/23/22 0100 09/23/22 0400 09/24/22 0500  Weight: 107.1 kg 107.1 kg 106.8 kg     Examination:   Physical Exam  Constitution: On high flow oxygen via nasal cannula, otherwise awake alert following commands  psychiatric:   Normal, stable mood, patient intact HEENT:       On for high flow oxygen by nasal cannula and Ventimask Otherwise normocephalic, PERRL,  Chest:       Symmetric Cardio vascular: Irregularly irregular S1/S2, RRR, No murmure,  pulmonary: Remains on 15 L of high flow oxygen by nasal cannula -  breath exchange mid to lower lobes, positive for rhonchi, wheezing Abdomen: Soft, nontender, nondistended Muscular skeletal: Sitting in chair, moving all 4 extremities  Limited exam -  Neuro: Stable, CNII-XII intact. , normal motor and sensation, reflexes intact  Extremities: NO  pitting edema lower extremities, +2 pulses  Skin: Dry, warm to touch, negative for any Rashes, No open wounds Wounds: per nursing documentation   ------------------------------------------------------------------------------------------------------------------------------------------    LABs:     Latest Ref Rng & Units 09/24/2022    5:03 AM 09/23/2022    5:11 AM 09/22/2022    7:08 PM  CBC  WBC 4.0 - 10.5 K/uL 11.0  13.2  12.3   Hemoglobin 13.0 - 17.0 g/dL 10.9  10.4  11.1   Hematocrit 39.0 - 52.0 % 32.7  30.6  32.9   Platelets 150 - 400 K/uL 178  210  219       Latest Ref Rng & Units 09/24/2022    5:03 AM 09/23/2022    5:11 AM 09/22/2022    7:08 PM  CMP  Glucose 70 - 99 mg/dL 252  204  162   BUN 8 - 23 mg/dL 28  22  16    Creatinine 0.61 - 1.24 mg/dL 0.99  1.07  1.05   Sodium 135 - 145 mmol/L 130  131  131   Potassium 3.5 - 5.1 mmol/L 3.9  4.0  4.0   Chloride 98 - 111 mmol/L 97  98  99   CO2 22 - 32 mmol/L 23  22  21    Calcium 8.9 - 10.3 mg/dL 8.5  8.3  8.3   Total Protein 6.5 - 8.1 g/dL 7.6  7.2     Total Bilirubin 0.3 - 1.2 mg/dL 0.7  0.9    Alkaline Phos 38 - 126 U/L 134  126  AST 15 - 41 U/L 33  35    ALT 0 - 44 U/L 33  33         Micro Results Recent Results (from the past 240 hour(s))  Resp panel by RT-PCR (RSV, Flu A&B, Covid) Anterior Nasal Swab     Status: None   Collection Time: 09/15/22  5:10 AM   Specimen: Anterior Nasal Swab  Result Value Ref Range Status   SARS Coronavirus 2 by RT PCR NEGATIVE NEGATIVE Final    Comment: (NOTE) SARS-CoV-2 target nucleic acids are NOT DETECTED.  The SARS-CoV-2 RNA is generally detectable in upper respiratory specimens during the acute phase of infection. The lowest concentration of SARS-CoV-2 viral copies this assay can detect is 138 copies/mL. A negative result does not preclude SARS-Cov-2 infection and should not be used as the sole basis for treatment or other patient management decisions. A negative result may occur with  improper specimen collection/handling, submission of specimen other than nasopharyngeal swab, presence of viral mutation(s) within the areas targeted by this assay, and inadequate number of viral copies(<138 copies/mL). A negative result must be combined with clinical observations, patient history, and epidemiological information. The expected result is Negative.  Fact Sheet for Patients:  EntrepreneurPulse.com.au  Fact Sheet for Healthcare Providers:  IncredibleEmployment.be  This test is no t yet approved or cleared by the [place] FDA and  has been authorized for detection and/or diagnosis of SARS-CoV-2 by FDA under an Emergency Use Authorization (EUA). This EUA will remain  in effect (meaning this test can be used) for the duration of the COVID-19 declaration under Section 564(b)(1) of the Act, 21 U.S.C.section 360bbb-3(b)(1), unless the authorization is terminated  or revoked sooner.       Influenza A by PCR NEGATIVE NEGATIVE Final   Influenza B by  PCR NEGATIVE NEGATIVE Final    Comment: (NOTE) The Xpert Xpress SARS-CoV-2/FLU/RSV plus assay is intended as an aid in the diagnosis of influenza from Nasopharyngeal swab specimens and should not be used as a sole basis for treatment. Nasal washings and aspirates are unacceptable for Xpert Xpress SARS-CoV-2/FLU/RSV testing.  Fact Sheet for Patients: EntrepreneurPulse.com.au  Fact Sheet for Healthcare Providers: IncredibleEmployment.be  This test is not yet approved or cleared by the [place] FDA and has been authorized for detection and/or diagnosis of SARS-CoV-2 by FDA under an Emergency Use Authorization (EUA). This EUA will remain in effect (meaning this test can be used) for the duration of the COVID-19 declaration under Section 564(b)(1) of the Act, 21 U.S.C. section 360bbb-3(b)(1), unless the authorization is terminated or revoked.     Resp Syncytial Virus by PCR NEGATIVE NEGATIVE Final    Comment: (NOTE) Fact Sheet for Patients: EntrepreneurPulse.com.au  Fact Sheet for Healthcare Providers: IncredibleEmployment.be  This test is not yet approved or cleared by the [place] FDA and has been authorized for detection and/or diagnosis of SARS-CoV-2 by FDA under an Emergency Use Authorization (EUA). This EUA will remain in effect (meaning this test can be used) for the duration of the COVID-19 declaration under Section 564(b)(1) of the Act, 21 U.S.C. section 360bbb-3(b)(1), unless the authorization is terminated or revoked.  Performed at [facility], [place] [address] [place], [place] [address]   Resp panel by RT-PCR (RSV, Flu A&B, Covid) Anterior Nasal Swab     Status: None   Collection Time: 09/22/22  7:14 PM   Specimen: Anterior Nasal Swab  Result Value Ref Range Status   SARS Coronavirus 2 by  RT PCR NEGATIVE NEGATIVE Final    Comment: (NOTE) SARS-CoV-2 target nucleic  acids are NOT DETECTED.  The SARS-CoV-2 RNA is generally detectable in upper respiratory specimens during the acute phase of infection. The lowest concentration of SARS-CoV-2 viral copies this assay can detect is 138 copies/mL. A negative result does not preclude SARS-Cov-2 infection and should not be used as the sole basis for treatment or other patient management decisions. A negative result may occur with  improper specimen collection/handling, submission of specimen other than nasopharyngeal swab, presence of viral mutation(s) within the areas targeted by this assay, and inadequate number of viral copies(<138 copies/mL). A negative result must be combined with clinical observations, patient history, and epidemiological information. The expected result is Negative.  Fact Sheet for Patients:  EntrepreneurPulse.com.au  Fact Sheet for Healthcare Providers:  IncredibleEmployment.be  This test is no t yet approved or cleared by the [place] FDA and  has been authorized for detection and/or diagnosis of SARS-CoV-2 by FDA under an Emergency Use Authorization (EUA). This EUA will remain  in effect (meaning this test can be used) for the duration of the COVID-19 declaration under Section 564(b)(1) of the Act, 21 U.S.C.section 360bbb-3(b)(1), unless the authorization is terminated  or revoked sooner.       Influenza A by PCR NEGATIVE NEGATIVE Final   Influenza B by PCR NEGATIVE NEGATIVE Final    Comment: (NOTE) The Xpert Xpress SARS-CoV-2/FLU/RSV plus assay is intended as an aid in the diagnosis of influenza from Nasopharyngeal swab specimens and should not be used as a sole basis for treatment. Nasal washings and aspirates are unacceptable for Xpert Xpress SARS-CoV-2/FLU/RSV testing.  Fact Sheet for Patients: EntrepreneurPulse.com.au  Fact Sheet for Healthcare Providers: IncredibleEmployment.be  This  test is not yet approved or cleared by the [place] FDA and has been authorized for detection and/or diagnosis of SARS-CoV-2 by FDA under an Emergency Use Authorization (EUA). This EUA will remain in effect (meaning this test can be used) for the duration of the COVID-19 declaration under Section 564(b)(1) of the Act, 21 U.S.C. section 360bbb-3(b)(1), unless the authorization is terminated or revoked.     Resp Syncytial Virus by PCR NEGATIVE NEGATIVE Final    Comment: (NOTE) Fact Sheet for Patients: EntrepreneurPulse.com.au  Fact Sheet for Healthcare Providers: IncredibleEmployment.be  This test is not yet approved or cleared by the [place] FDA and has been authorized for detection and/or diagnosis of SARS-CoV-2 by FDA under an Emergency Use Authorization (EUA). This EUA will remain in effect (meaning this test can be used) for the duration of the COVID-19 declaration under Section 564(b)(1) of the Act, 21 U.S.C. section 360bbb-3(b)(1), unless the authorization is terminated or revoked.  Performed at [facility], [address], [place], [place] [address]   MRSA Next Gen by PCR, Nasal     Status: None   Collection Time: 09/23/22  1:15 AM   Specimen: Nasal Mucosa; Nasal Swab  Result Value Ref Range Status   MRSA by PCR Next Gen NOT DETECTED NOT DETECTED Final    Comment: (NOTE) The GeneXpert MRSA Assay (FDA approved for NASAL specimens only), is one component of a comprehensive MRSA colonization surveillance program. It is not intended to diagnose MRSA infection nor to guide or monitor treatment for MRSA infections. Test performance is not FDA approved in patients less than 69 years old. Performed at [facility], [address] [place], [place] [address]     Radiology Reports No results found.  SIGNED: Valeria Batman  Arliss Hepburn, MD, FHM. > 75 minutes of critical care time was spent in evaluating patient, stabilizing the patient,  reviewing medical records, labs, drawn plan of care, discussing with consultants and ICU staff  [facility],  Pager (please use [email or web address] to page/text) Please use Epic Secure Chat for non-urgent communication (7AM-7PM)  If 7PM-7AM, please contact night-coverage [email or web address], 09/24/2022, 12:00 PM

## 2022-09-25 ENCOUNTER — Ambulatory Visit: Payer: Medicare Other

## 2022-09-25 ENCOUNTER — Inpatient Hospital Stay: Payer: Medicare Other | Admitting: Adult Health

## 2022-09-25 DIAGNOSIS — Z7189 Other specified counseling: Secondary | ICD-10-CM | POA: Diagnosis not present

## 2022-09-25 DIAGNOSIS — I4891 Unspecified atrial fibrillation: Secondary | ICD-10-CM | POA: Diagnosis not present

## 2022-09-25 DIAGNOSIS — Z515 Encounter for palliative care: Secondary | ICD-10-CM | POA: Diagnosis not present

## 2022-09-25 DIAGNOSIS — C3490 Malignant neoplasm of unspecified part of unspecified bronchus or lung: Secondary | ICD-10-CM | POA: Diagnosis not present

## 2022-09-25 LAB — CBC
HCT: 34.4 % — ABNORMAL LOW (ref 39.0–52.0)
Hemoglobin: 11.4 g/dL — ABNORMAL LOW (ref 13.0–17.0)
MCH: 30.9 pg (ref 26.0–34.0)
MCHC: 33.1 g/dL (ref 30.0–36.0)
MCV: 93.2 fL (ref 80.0–100.0)
Platelets: 212 10*3/uL (ref 150–400)
RBC: 3.69 MIL/uL — ABNORMAL LOW (ref 4.22–5.81)
RDW: 15.5 % (ref 11.5–15.5)
WBC: 15.2 10*3/uL — ABNORMAL HIGH (ref 4.0–10.5)
nRBC: 0 % (ref 0.0–0.2)

## 2022-09-25 LAB — GLUCOSE, CAPILLARY
Glucose-Capillary: 324 mg/dL — ABNORMAL HIGH (ref 70–99)
Glucose-Capillary: 279 mg/dL — ABNORMAL HIGH (ref 70–99)
Glucose-Capillary: 300 mg/dL — ABNORMAL HIGH (ref 70–99)

## 2022-09-25 LAB — COMPREHENSIVE METABOLIC PANEL
ALT: 39 U/L (ref 0–44)
AST: 37 U/L (ref 15–41)
Albumin: 2.9 g/dL — ABNORMAL LOW (ref 3.5–5.0)
Alkaline Phosphatase: 160 U/L — ABNORMAL HIGH (ref 38–126)
Anion gap: 11 (ref 5–15)
BUN: 30 mg/dL — ABNORMAL HIGH (ref 8–23)
CO2: 26 mmol/L (ref 22–32)
Calcium: 8.8 mg/dL — ABNORMAL LOW (ref 8.9–10.3)
Chloride: 97 mmol/L — ABNORMAL LOW (ref 98–111)
Creatinine, Ser: 1.03 mg/dL (ref 0.61–1.24)
GFR, Estimated: >60 mL/min (ref >60)
Glucose, Bld: 271 mg/dL — ABNORMAL HIGH (ref 70–99)
Potassium: 4.2 mmol/L (ref 3.5–5.1)
Sodium: 134 mmol/L — ABNORMAL LOW (ref 135–145)
Total Bilirubin: 0.7 mg/dL (ref 0.3–1.2)
Total Protein: 7.8 g/dL (ref 6.5–8.1)

## 2022-09-25 LAB — BRAIN NATRIURETIC PEPTIDE: B Natriuretic Peptide: 222 pg/mL — ABNORMAL HIGH (ref 0.0–100.0)

## 2022-09-25 MED ORDER — MORPHINE SULFATE (CONCENTRATE) 20 MG/ML PO SOLN: 10.0000 mg | ORAL | 0 refills | Status: AC | PRN

## 2022-09-25 MED ORDER — IPRATROPIUM BROMIDE 0.02 % IN SOLN: 0.5000 mg | Freq: Three times a day (TID) | RESPIRATORY_TRACT | 12 refills | Status: AC

## 2022-09-25 MED ORDER — FUROSEMIDE 10 MG/ML IJ SOLN
40.0000 mg | Freq: Every day | INTRAMUSCULAR | Status: DC
  Administered 2022-09-25: 40 mg via INTRAVENOUS
  Filled 2022-09-25: qty 4

## 2022-09-25 MED ORDER — LEVALBUTEROL HCL 0.63 MG/3ML IN NEBU: 0.6300 mg | INHALATION_SOLUTION | Freq: Three times a day (TID) | RESPIRATORY_TRACT | 12 refills | Status: AC

## 2022-09-25 MED ORDER — LORAZEPAM 1 MG PO TABS
1.0000 mg | ORAL_TABLET | ORAL | Status: DC | PRN
  Administered 2022-09-25: 1 mg via SUBLINGUAL
  Filled 2022-09-25: qty 1

## 2022-09-25 MED ORDER — LEVALBUTEROL HCL 0.63 MG/3ML IN NEBU: 0.6300 mg | INHALATION_SOLUTION | Freq: Three times a day (TID) | RESPIRATORY_TRACT | 12 refills | Status: DC

## 2022-09-25 MED ORDER — ALBUTEROL SULFATE (2.5 MG/3ML) 0.083% IN NEBU: 2.5000 mg | INHALATION_SOLUTION | RESPIRATORY_TRACT | 2 refills | Status: AC | PRN

## 2022-09-25 MED ORDER — LORAZEPAM 2 MG/ML IJ SOLN: 0.5000 mg | INTRAMUSCULAR | Status: DC | PRN

## 2022-09-25 MED ORDER — IPRATROPIUM BROMIDE 0.02 % IN SOLN: 0.5000 mg | Freq: Three times a day (TID) | RESPIRATORY_TRACT | 12 refills | Status: DC

## 2022-09-25 MED ORDER — ANORO ELLIPTA 62.5-25 MCG/ACT IN AEPB: 1.0000 | INHALATION_SPRAY | Freq: Every day | RESPIRATORY_TRACT | 3 refills | Status: AC

## 2022-09-25 MED ORDER — LORAZEPAM 1 MG PO TABS: 1.0000 mg | ORAL_TABLET | ORAL | 0 refills | Status: AC | PRN

## 2022-09-28 ENCOUNTER — Ambulatory Visit: Payer: Medicare Other

## 2022-09-28 NOTE — Telephone Encounter (Signed)
I am very sorry to hear. Thank you for letting me know.

## 2022-09-28 NOTE — Telephone Encounter (Signed)
Mychart message sent by pt's spouse stating that pt has passed away. Routing to Dr. Lamonte Sakai as an Juluis Rainier.

## 2022-09-29 ENCOUNTER — Ambulatory Visit: Payer: Medicare Other

## 2022-09-29 ENCOUNTER — Encounter: Payer: Self-pay | Admitting: Radiation Oncology

## 2022-09-29 NOTE — Progress Notes (Signed)
  Radiation Oncology         ([phone] ________________________________  Name: Daniel Ball MRN: [id number]  Date: October 28, 2022  DOB: 12-22-1955  End of Treatment Note  Diagnosis:   Recurrent Stage IA2, cT1bN0M0, NSCLC, squamous cell carcinoma of the RLL      Indication for treatment:  palliative       Radiation treatment dates:   09/02/22-09/22/22  Site/planned dose:   The RLL target was to receive 37.5 Gy in 15 fractions, but he only received 12 fractions, totaling 30 Gy due to hospitalization for respiratory distress.  Narrative: The patient's status declined during his course of radiation, he was hosptialized twice, once with A fib. With RVR, then most recently with respiratory failure. He decided to forgo additional treatments, and was discharged on 24-Oct-2022 with hospice care.   Plan: We were notified he passed away on Oct 28, 2022. We will be available as needed for his family and to answer questions about his care as needed.     Carola Rhine, PAC

## 2022-09-30 ENCOUNTER — Other Ambulatory Visit: Payer: Medicare Other

## 2022-09-30 ENCOUNTER — Ambulatory Visit: Payer: Medicare Other

## 2022-10-02 ENCOUNTER — Ambulatory Visit: Payer: Medicare Other | Admitting: Nurse Practitioner

## 2022-10-07 ENCOUNTER — Encounter: Payer: Self-pay | Admitting: Medical Oncology

## 2022-10-07 ENCOUNTER — Other Ambulatory Visit: Payer: Medicare Other

## 2022-10-08 ENCOUNTER — Telehealth: Payer: Self-pay | Admitting: Medical Oncology

## 2022-10-08 NOTE — Telephone Encounter (Signed)
Emailed letter supporting pt had stage 4 metastatic lung cancer. Pts cancer policy staff told wife that Daniel Ball did not have stage 4 lung cancer per path report 08/19/22.

## 2022-10-14 ENCOUNTER — Other Ambulatory Visit: Payer: Medicare Other

## 2022-10-14 ENCOUNTER — Ambulatory Visit: Payer: Medicare Other

## 2022-10-14 ENCOUNTER — Ambulatory Visit: Payer: Medicare Other | Admitting: Physician Assistant

## 2022-10-16 ENCOUNTER — Ambulatory Visit: Payer: Medicare Other

## 2022-10-21 ENCOUNTER — Other Ambulatory Visit: Payer: Medicare Other

## 2022-10-22 NOTE — Care Management Important Message (Signed)
Important Message  Patient Details  Name: Daniel Ball MRN: [id number] Date of Birth: 12-28-1955   Medicare Important Message Given:  N/A - LOS <3 / Initial given by admissions     Daniel Ball 2022/10/19, 11:51 AM

## 2022-10-22 NOTE — Progress Notes (Signed)
Speech Language Pathology Treatment: Dysphagia  Patient Details Name: Daniel Ball MRN: [id number] DOB: Jun 01, 1956 Today's Date: 10-13-22 Time: 2233-6122 SLP Time Calculation (min) (ACUTE ONLY): 15 min  Assessment / Plan / Recommendation Clinical Impression  Ongoing diagnostic dysphagia therapy provided this am; Pt consumed single sips of coffee without overt s/sx of aspiration. SLP reinforced strategies from BSE with Pt and daughter. Pt with very little appetite however reports of Pt tolerating diet as ordered. There are no further ST needs noted at this time, ST will sign off, thank you.  HPI HPI: Daniel Ball is a 67 y.o. male with medical history significant of atrial fibrillation, CAD, hypertension, hyperlipidemia, COPD, GERD lung cancer on active chemotherapy and radiation (follows with Dr. Earlie Server) and class III obesity who presents to the emergency department due to shortness of breath on exertion and nonproductive cough  Patient was recently admitted to [facility] from 5/26 to 12/27 due to atrial fibrillation with RVR and this was treated with Cardizem.  He was discharged home and was doing fine for few days, but this worsened again and presented to the ED for further evaluation and management.   BSE requested      SLP Plan  All goals met      Recommendations for follow up therapy are one component of a multi-disciplinary discharge planning process, led by the attending physician.  Recommendations may be updated based on patient status, additional functional criteria and insurance authorization.    Recommendations  Diet recommendations: Dysphagia 3 (mechanical soft);Thin liquid Liquids provided via: Cup;Straw Medication Administration: Whole meds with liquid Supervision: Patient able to self feed Compensations: Minimize environmental distractions;Slow rate;Small sips/bites                Oral Care Recommendations: Oral care BID Follow Up Recommendations: No SLP  follow up Plan: All goals met         Daniel Ball H. Roddie Mc, CCC-SLP Speech Language Pathologist   Wende Bushy  Oct 13, 2022, 9:30 AM

## 2022-10-22 NOTE — Progress Notes (Signed)
Progress Note  Patient Name: Daniel Ball Date of Encounter: 08-Oct-2022  Primary Cardiologist: [name], MD  Subjective   Patient in bedside chair with nasal cannula and nonrebreather.  Still having trouble with hypoxia and some confusion.  Family states he did reasonably well overnight, but has been more agitated this morning.  Inpatient Medications    Scheduled Meds:  ALPRAZolam  0.5 mg Oral QHS   apixaban  5 mg Oral BID   budesonide (PULMICORT) nebulizer solution  0.25 mg Nebulization BID   Chlorhexidine Gluconate Cloth  6 each Topical Q0600   clopidogrel  75 mg Oral Q breakfast   diltiazem  90 mg Oral Q6H   furosemide  40 mg Intravenous Daily   insulin aspart  0-15 Units Subcutaneous TID WC   insulin glargine-yfgn  10 Units Subcutaneous QHS   ipratropium  0.5 mg Nebulization TID   levalbuterol  0.63 mg Nebulization TID   methylPREDNISolone (SOLU-MEDROL) injection  125 mg Intravenous Q12H   mouth rinse  15 mL Mouth Rinse 4 times per day   pantoprazole  40 mg Oral BID AC   rosuvastatin  40 mg Oral Daily    PRN Meds: ALPRAZolam **AND** ALPRAZolam, guaiFENesin, levalbuterol, morphine injection, mouth rinse, prochlorperazine   Vital Signs    Vitals:   2022/10/08 0100 10-08-2022 0303 October 08, 2022 0400 10/08/2022 0831  BP:  139/74    Pulse: (!) 106     Resp:  (!) 22    Temp:   98.6 F (37 C) (!) 96 F (35.6 C)  TempSrc:   Axillary Axillary  SpO2: 91%     Weight:      Height:        Intake/Output Summary (Last 24 hours) at 08-Oct-2022 0848 Last data filed at 09/24/2022 1759 Gross per 24 hour  Intake 282.66 ml  Output 450 ml  Net -167.34 ml   Filed Weights   09/23/22 0100 09/23/22 0400 09/24/22 0500  Weight: 107.1 kg 107.1 kg 106.8 kg    Telemetry    Atrial fibrillation around 100 bpm.  Personally reviewed.  ECG    An ECG dated 09/23/2022 was personally reviewed today and demonstrated:  Atrial fibrillation with nonspecific ST changes.  Physical Exam   GEN:  Short of breath at rest, nasal cannula and nonrebreather in place. Neck: No JVD. Cardiac: Irregularly irregular without gallop.  Respiratory: Coarse, rhonchorous breath sounds as before. GI: Soft, nontender, bowel sounds present. MS: No edema; No deformity.  Labs    Chemistry Recent Labs  Lab 09/23/22 0511 09/24/22 0503 10-08-2022 0328  NA 131* 130* 134*  K 4.0 3.9 4.2  CL 98 97* 97*  CO2 22 23 26   GLUCOSE 204* 252* 271*  BUN 22 28* 30*  CREATININE 1.07 0.99 1.03  CALCIUM 8.3* 8.5* 8.8*  PROT 7.2 7.6 7.8  ALBUMIN 2.8* 2.8* 2.9*  AST 35 33 37  ALT 33 33 39  ALKPHOS 126 134* 160*  BILITOT 0.9 0.7 0.7  GFRNONAA >60 >60 >60  ANIONGAP 11 10 11      Hematology Recent Labs  Lab 09/23/22 0511 09/24/22 0503 10/08/22 0328  WBC 13.2* 11.0* 15.2*  RBC 3.35* 3.53* 3.69*  HGB 10.4* 10.9* 11.4*  HCT 30.6* 32.7* 34.4*  MCV 91.3 92.6 93.2  MCH 31.0 30.9 30.9  MCHC 34.0 33.3 33.1  RDW 15.6* 15.4 15.5  PLT 210 178 212    Cardiac Enzymes Recent Labs  Lab 09/15/22 0502 09/15/22 0658 09/22/22 1907 09/22/22 2108  TROPONINIHS  14 14 10 11     BNP Recent Labs  Lab 09/22/22 1909 10/22/2022 0328  BNP 333.0* 222.0*     Radiology    No results found.  Cardiac Studies   Echocardiogram 09/15/2022:  1. Left ventricular ejection fraction, by estimation, is 55 to 60%. The  left ventricle has normal function. The left ventricle has no regional  wall motion abnormalities. There is mild left ventricular hypertrophy.  Left ventricular diastolic parameters  are indeterminate.   2. Right ventricular systolic function is normal. The right ventricular  size is normal. Tricuspid regurgitation signal is inadequate for assessing  PA pressure.   3. A small pericardial effusion is present.   4. The mitral valve was not well visualized. No evidence of mitral valve  regurgitation. No evidence of mitral stenosis.   5. The aortic valve is grossly normal. There is mild thickening of the   aortic valve. Aortic valve regurgitation is not visualized. No aortic  stenosis is present.   Assessment & Plan    1.  Persistent atrial fibrillation presenting with RVR, CHA2DS2-VASc score is 3.  Heart rate control better at this point and tolerating oral Cardizem 90 mg p.o. every 6 hours.  He is on Eliquis for stroke prophylaxis.  2.  Metastatic stage IV NSCLC most recently managed with carboplatin, Taxol, Libtayo as well as XRT.  He met with palliative care and decision made to stop further treatment.  He is currently DNR.  Now with hypoxic respiratory failure.  Also evaluated by Pulmonary/CCM.  Suspect further discussion should be had regarding hospice referral.  3.  CAD status post arthrectomy and DES to the LAD in 2021, on Plavix and Crestor.  No active angina and cardiac enzymes normal.  Spoke with patient and family members present in room today.  Heart rate control adequate given current comorbid illness.  Continue Cardizem 90 mg p.o. every 6 hours, once disposition clarified can switch to Cardizem CD 360 mg daily.  Cardiology available on call over the weekend if further questions regarding rhythm management arise.  Otherwise continue Eliquis for stroke prophylaxis.  Recommend further discussion with palliative care regarding candidacy for hospice.  Signed, Rozann Lesches, MD  2022-10-22, 8:48 AM

## 2022-10-22 NOTE — Discharge Summary (Signed)
Physician Discharge Summary   Patient: Daniel Ball MRN: [id number] DOB: 1956/06/06  Admit date:     09/22/2022  Discharge date: 10/17/2022  Discharge Physician: Deatra James   PCP: [name], MD   Recommendations at discharge:  Follow-up with home hospice  Discharge Diagnoses: Principal Problem:   Atrial fibrillation with RVR ([place]) Active Problems:   Essential hypertension   GERD   COPD (chronic obstructive pulmonary disease) (HCC)   Obesity, Class III, BMI 40-49.9 (morbid obesity) ([place])   Malignant neoplasm of right upper lobe of lung (HCC)   Thrombocytopenia (HCC)   Acute respiratory failure with hypoxia (HCC)   Leukocytosis   Hyponatremia   Elevated brain natriuretic peptide (BNP) level   Type 2 diabetes mellitus with hyperglycemia (HCC)   Mixed hyperlipidemia   Acute respiratory failure (HCC)  Resolved Problems:   * No resolved Ball problems. *  Ball Course:  Daniel Ball is a 67 y.o. male with medical history significant of atrial fibrillation, CAD, hypertension, hyperlipidemia, COPD, GERD lung cancer on active chemotherapy and radiation (follows with Dr. Earlie Server) and class III obesity who presents to the emergency department due to shortness of breath on exertion and nonproductive cough Patient was recently admitted to [facility] from 5/26 to 12/27 due to atrial fibrillation with RVR and this was treated with Cardizem.  He was discharged home and was doing fine for few days, but this worsened again and presented to the ED for further evaluation and management. Patient complained of some chest discomfort which occurs mostly when he has palpitations.   ED Course:  Patient was tachypneic and tachycardic, BP was 146/73, O2 sat was 96% on NRB, was transitioned to BiPAP and O2 sats increased to 100% at FiO2 of 50%..  Workup in the ED showed leukocytosis and normocytic anemia.  BMP was normal except for hyponatremia and hyperglycemia.  BNP 333, troponin x  2 was negative, magnesium 2.1.   Influenza A, B, SARS coronavirus 2, RSV was negative. Chest x-ray showed no  significant interval change. Stable diffuse interstitial changes single lungs, possibly fibrosis. Patient was started on Cardizem drip, cardiology on-call at [place] [facility] was consulted and states that patient can stay here at [facility].   Hospitalist was asked to admit patient for further evaluation and management.     Overall no improvement in acute respiratory failure and hypoxia was noted despite all aggressive treatments  Patient and family decided not to pursue chemo and radiation therapy anymore, they have decided to return home under hospice care.    Acute respiratory failure with hypoxia -No improvement -Cannot tolerate BiPAP, has been on high flow oxygen by nasal cannula up to 20 L and Ventimask -On BiPAP was satting 97%, currently satting 88%  Discussed with pulmonary Dr. Melvyn Novas who has been following closely  With history of lung cancer follows with Dr. Earlie Server Currently on radiation therapy -patient has decided not to pursue radiation therapy at this point  -> BiPAP-recommended, but he cannot tolerate it due to being claustrophobic -last  ABG    Component Value Date/Time   PHART 7.49 (H) 09/23/2022 0905   PCO2ART 35 09/23/2022 0905   PO2ART 136 (H) 09/23/2022 0905   HCO3 26.8 09/23/2022 0905   TCO2 31 11/02/2019 0858   O2SAT 99.2 09/23/2022 0905      -The patient and the family confirmed that he is not O2 dependent at baseline -Had recent hospitalization last week at [facility] for similar  -Discussed the case  with pulmonary Dr. Mertha Finders..  Recommending BiPAP, ABGs, chest x-ray, high flow oxygen for now, breathing treatments (switch to Xopenex/Atrovent due to A-fib-tachyarrhythmia) High-dose IV steroids >>> which will be discontinued upon discharge  -Patient requesting not to be transferred   Final discussion with family and palliative care team: Patient to return  home under hospice care  Elevated BNP Shortness of admit acute respiratory distress with elevated BNP 333 (this was 220.9 about 8 days ago) Echocardiogram done on 12/26 showed LVEF of 55 to 60%.  No RWMA.  Mild LVH.  LV diastolic parameters indeterminate. Continue total input/output, daily weights and fluid restriction EKG >>> and A-fib  Echocardiogram >>> reviewed Cardiology was consulted, Dr. Domenic Polite following appreciate input    Atrial fibrillation with RVR On Cardizem drip -back on p.o. Cardizem -Continue Eliquis    Leukocytosis possibly reactive WBC 12.3 >>> 11.0   Type 2 diabetes mellitus with hyperglycemia Hemoglobin A1c about 8 days ago was 7.1 Continue Semglee 5>>10  units nightly and adjust dose accordingly -Resume metformin   Hyponatremia -resolved  Mixed hyperlipidemia Continue Crestor   COPD Continue Xopenex   GERD Continue Protonix   CAD Continue Plavix, Crestor   Goals of care: Continue discussion with palliative care, DNR/DNI now Pursuing hospice at home   Class III obesity BMI 40.53; continue lifestyle and diet modification   Lung cancer Metastatic stage IV NSCLC on carboplatin Taxol Libtayo X1 cycle status post XRT 2023 (destructive lesion right rib) + recurrence multifocal thoracic mets follows with Dr. Earlie Server (recent OV 09/08/2022)  Discussed with Dr. Earlie Server on 09/23/2022 --he does not have much to add Patient has decided to cease his radiation therapy at this point   After extensive discussion with palliative care team the patient and family has decided to return home under hospice care     Consultants: Pulmonary, cardiology, oncology  Disposition:  Home with hospice Diet recommendation:  Discharge Diet Orders (From admission, onward)     Start     Ordered   10-02-22 0000  Diet - low sodium heart healthy        10-02-22 1119           Regular diet DISCHARGE MEDICATION: Allergies as of 10-02-22   No Known Allergies       Medication List     STOP taking these medications    rosuvastatin 40 MG tablet Commonly known as: CRESTOR       TAKE these medications    albuterol 108 (90 Base) MCG/ACT inhaler Commonly known as: VENTOLIN HFA Inhale 2 puffs into the lungs every 6 (six) hours as needed for wheezing or shortness of breath.   Anoro Ellipta 62.5-25 MCG/ACT Aepb Generic drug: umeclidinium-vilanterol Inhale 1 puff into the lungs daily.   apixaban 5 MG Tabs tablet Commonly known as: Eliquis Take 1 tablet (5 mg total) by mouth 2 (two) times daily.   buPROPion 300 MG 24 hr tablet Commonly known as: WELLBUTRIN XL Take 300 mg by mouth daily.   clopidogrel 75 MG tablet Commonly known as: PLAVIX Take 1 tablet (75 mg total) by mouth daily with breakfast. Okay to restart this medication on 10/21/2021 What changed: additional instructions   cyclobenzaprine 10 MG tablet Commonly known as: FLEXERIL Take 10 mg by mouth 2 (two) times daily as needed for muscle spasms.   diltiazem 180 MG 24 hr capsule Commonly known as: CARDIZEM CD Take 1 capsule (180 mg total) by mouth daily.   furosemide 40 MG tablet Commonly known as:  LASIX TAKE 1 TABLET BY MOUTH ONCE DAILY AS NEEDED FOR  EDEMA  OR  WEIGHT  GAIN What changed:  how much to take how to take this when to take this additional instructions   isosorbide mononitrate 60 MG 24 hr tablet Commonly known as: IMDUR Take 1 tablet (60 mg total) by mouth daily.   metFORMIN 500 MG 24 hr tablet Commonly known as: GLUCOPHAGE-XR Take 1,000 mg by mouth 2 (two) times daily with a meal.   metoprolol succinate 25 MG 24 hr tablet Commonly known as: TOPROL-XL Take 1 tablet (25 mg total) by mouth daily.   nitroGLYCERIN 0.4 MG SL tablet Commonly known as: Nitrostat Place 1 tablet (0.4 mg total) under the tongue every 5 (five) minutes x 3 doses as needed for chest pain.   pantoprazole 40 MG tablet Commonly known as: PROTONIX Take 1 tablet (40 mg total) by  mouth daily. What changed: when to take this   potassium chloride SA 20 MEQ tablet Commonly known as: KLOR-CON M TAKE 1 TABLET BY MOUTH ONCE DAILY AS NEEDED - TAKE ON THE DAYS YOU TAKE LASIX What changed:  how much to take how to take this when to take this additional instructions   PRESCRIPTION MEDICATION See admin instructions. CPAP- Use during any time of rest   prochlorperazine 10 MG tablet Commonly known as: COMPAZINE Take 1 tablet (10 mg total) by mouth every 6 (six) hours as needed. What changed: reasons to take this   TYLENOL 500 MG tablet Generic drug: acetaminophen Take 500-1,000 mg by mouth every 6 (six) hours as needed for mild pain or headache.   valsartan 320 MG tablet Commonly known as: DIOVAN Take 160 mg by mouth daily.        Discharge Exam: Filed Weights   09/23/22 0100 09/23/22 0400 09/24/22 0500  Weight: 107.1 kg 107.1 kg 106.8 kg      General:  AAO x 3,  cooperative, no distress, on high flow oxygen via nasal cannula and Ventimask  HEENT:  Normocephalic, PERRL, otherwise with in Normal limits   Neuro:  CNII-XII intact. , normal motor and sensation, reflexes intact   Lungs:   Clear to auscultation BL, Respirations unlabored,  No wheezes / crackles  Cardio:    S1/S2, RRR, No murmure, No Rubs or Gallops   Abdomen:  Soft, non-tender, bowel sounds active all four quadrants, no guarding or peritoneal signs.  Muscular  skeletal:  Limited exam -global generalized weaknesses - in bed, able to move all 4 extremities,   2+ pulses,  symmetric, No pitting edema  Skin:  Dry, warm to touch, negative for any Rashes,  Wounds: Please see nursing documentation        Condition at discharge: serious  The results of significant diagnostics from this hospitalization (including imaging, microbiology, ancillary and laboratory) are listed below for reference.   Imaging Studies: DG CHEST PORT 1 VIEW  Result Date: 09/23/2022 CLINICAL DATA:  Shortness of  breath. EXAM: PORTABLE CHEST 1 VIEW COMPARISON:  09/22/2022 and CT chest 09/15/2022. FINDINGS: Trachea is midline. Heart is enlarged. Coarsened interstitial markings bilaterally with post treatment scarring and fiducial markers in the right upper lobe. Findings are unchanged from 09/22/2022. No pleural fluid. IMPRESSION: 1. No acute findings. 2. Post treatment scarring in the right upper lobe. Known metastatic lesions in the right hemithorax are better seen on recent cross-sectional imaging. 3. Coarsened interstitial pulmonary markings, as on 09/15/2022. If further evaluation is desired, outpatient high-resolution chest CT without contrast could  be performed. Electronically Signed   By: Lorin Picket M.D.   On: 09/23/2022 08:45   DG Chest Port 1 View  Result Date: 09/22/2022 CLINICAL DATA:  Shortness of breath and chest pain EXAM: PORTABLE CHEST 1 VIEW COMPARISON:  CT and x-ray 09/15/2022.  Older exams as well. FINDINGS: Extensive interstitial changes again identified, similar to previous. No pneumothorax, effusion or consolidation. Stable cardiopericardial silhouette. Overlapping cardiac leads. Surgical clips overlie the upper right hemithorax. Degenerative changes along the spine and shoulders. IMPRESSION: No significant interval change. Stable diffuse interstitial changes single lungs, possibly fibrosis. Please correlate with prior CT Electronically Signed   By: Jill Side M.D.   On: 09/22/2022 19:21   ECHOCARDIOGRAM COMPLETE  Result Date: 09/15/2022    ECHOCARDIOGRAM REPORT   Patient Name:   ADELARD SANON Date of Exam: 09/15/2022 Medical Rec #:  [id number]        Height:       64.0 in Accession #:    [id number]       Weight:       244.4 lb Date of Birth:  13-Sep-1956        BSA:          2.130 m Patient Age:    22 years         BP:           124/63 mmHg Patient Gender: M                HR:           113 bpm. Exam Location:  Inpatient Procedure: 2D Echo, Cardiac Doppler, Color Doppler and  Intracardiac            Opacification Agent Indications:    Atrial Fibrillation I48.91  History:        Patient has prior history of Echocardiogram examinations, most                 recent 11/17/2019. CAD, COPD, Signs/Symptoms:Shortness of Breath;                 Risk Factors:Hypertension and Dyslipidemia. Lung cancer.  Sonographer:    Darlina Sicilian RDCS Referring Phys: [id number] DAVID MANUEL ORTIZ  Sonographer Comments: Suboptimal parasternal window, suboptimal apical window, suboptimal subcostal window and Technically challenging study due to limited acoustic windows. IMPRESSIONS  1. Left ventricular ejection fraction, by estimation, is 55 to 60%. The left ventricle has normal function. The left ventricle has no regional wall motion abnormalities. There is mild left ventricular hypertrophy. Left ventricular diastolic parameters are indeterminate.  2. Right ventricular systolic function is normal. The right ventricular size is normal. Tricuspid regurgitation signal is inadequate for assessing PA pressure.  3. A small pericardial effusion is present.  4. The mitral valve was not well visualized. No evidence of mitral valve regurgitation. No evidence of mitral stenosis.  5. The aortic valve is grossly normal. There is mild thickening of the aortic valve. Aortic valve regurgitation is not visualized. No aortic stenosis is present. FINDINGS  Left Ventricle: Left ventricular ejection fraction, by estimation, is 55 to 60%. The left ventricle has normal function. The left ventricle has no regional wall motion abnormalities. Definity contrast agent was given IV to delineate the left ventricular  endocardial borders. The left ventricular internal cavity size was normal in size. There is mild left ventricular hypertrophy. Left ventricular diastolic parameters are indeterminate. Right Ventricle: The right ventricular size is normal. Right vetricular wall thickness was not well visualized. Right  ventricular systolic function is  normal. Tricuspid regurgitation signal is inadequate for assessing PA pressure. Left Atrium: Left atrial size was normal in size. Right Atrium: Right atrial size was normal in size. Pericardium: A small pericardial effusion is present. Mitral Valve: The mitral valve was not well visualized. No evidence of mitral valve regurgitation. No evidence of mitral valve stenosis. Tricuspid Valve: The tricuspid valve is grossly normal. Tricuspid valve regurgitation is not demonstrated. No evidence of tricuspid stenosis. Aortic Valve: The aortic valve is grossly normal. There is mild thickening of the aortic valve. Aortic valve regurgitation is not visualized. No aortic stenosis is present. Pulmonic Valve: The pulmonic valve was not well visualized. Pulmonic valve regurgitation is trivial. No evidence of pulmonic stenosis. Aorta: The aortic root is normal in size and structure. Ascending aorta measurements are within normal limits for age when indexed to body surface area. Venous: The inferior vena cava was not well visualized. IAS/Shunts: The interatrial septum was not well visualized.  LEFT VENTRICLE PLAX 2D LVIDd:         4.00 cm     Diastology LVIDs:         2.70 cm     LV e' medial:    6.91 cm/s LV PW:         1.10 cm     LV E/e' medial:  10.0 LV IVS:        1.30 cm     LV e' lateral:   9.57 cm/s LVOT diam:     2.20 cm     LV E/e' lateral: 7.2 LV SV:         29 LV SV Index:   14 LVOT Area:     3.80 cm  LV Volumes (MOD) LV vol d, MOD A2C: 55.7 ml LV vol d, MOD A4C: 77.4 ml LV vol s, MOD A2C: 21.5 ml LV vol s, MOD A4C: 37.8 ml LV SV MOD A2C:     34.2 ml LV SV MOD A4C:     77.4 ml LV SV MOD BP:      37.8 ml LEFT ATRIUM             Index LA Vol (A2C):   31.5 ml 14.79 ml/m LA Vol (A4C):   58.2 ml 27.32 ml/m LA Biplane Vol: 43.4 ml 20.37 ml/m  AORTIC VALVE LVOT Vmax:   60.00 cm/s LVOT Vmean:  42.800 cm/s LVOT VTI:    0.077 m  AORTA Ao Root diam: 3.70 cm Ao Asc diam:  3.30 cm MITRAL VALVE MV Area (PHT): 4.53 cm    SHUNTS MV  Decel Time: 167 msec    Systemic VTI:  0.08 m MV E velocity: 69.13 cm/s  Systemic Diam: 2.20 cm Cherlynn Kaiser MD Electronically signed by Cherlynn Kaiser MD Signature Date/Time: 09/15/2022/6:10:39 PM    Final    CT Angio Chest PE W and/or Wo Contrast  Result Date: 09/15/2022 CLINICAL DATA:  67 year old male with history of acute onset of nonlocalized abdominal pain and shortness of breath. History of non-small cell lung cancer status post radiation therapy. * Tracking Code: BO * . EXAM: CT ANGIOGRAPHY CHEST CT ABDOMEN AND PELVIS WITH CONTRAST TECHNIQUE: Multidetector CT imaging of the chest was performed using the standard protocol during bolus administration of intravenous contrast. Multiplanar CT image reconstructions and MIPs were obtained to evaluate the vascular anatomy. Multidetector CT imaging of the abdomen and pelvis was performed using the standard protocol during bolus administration of intravenous contrast. RADIATION DOSE REDUCTION: This  exam was performed according to the departmental dose-optimization program which includes automated exposure control, adjustment of the mA and/or kV according to patient size and/or use of iterative reconstruction technique. CONTRAST:  182mL OMNIPAQUE IOHEXOL 350 MG/ML SOLN COMPARISON:  PET-CT 08/16/2022.  Chest CT 07/29/2022. FINDINGS: CTA CHEST FINDINGS Cardiovascular: No filling defects within the central, lobar or segmental sized pulmonary artery branch to suggest clinically significant pulmonary embolism. Smaller subsegmental sized emboli can not be entirely excluded secondary to extensive patient respiratory motion. Heart size is normal. There is no significant pericardial fluid, thickening or pericardial calcification. There is aortic atherosclerosis, as well as atherosclerosis of the great vessels of the mediastinum and the coronary arteries, including calcified atherosclerotic plaque in the left main, left anterior descending and right coronary arteries.  Thickening and calcification of the aortic valve. Mediastinum/Nodes: No pathologically enlarged mediastinal or hilar lymph nodes. Small hiatal hernia. Fat and fluid associated with the hiatal hernia, similar to the prior study. Esophagus is otherwise unremarkable in appearance. No axillary lymphadenopathy. Lungs/Pleura: Fiducial markers in the right upper lobe with adjacent mass-like area of architectural distortion, similar to prior studies, most compatible with chronic postradiation mass-like fibrosis. Pleural-based soft tissue masses in the posterior aspect of the lower right hemithorax again noted measuring up to 4.8 x 2.1 cm (axial image 45 of series 5), invasive into the posterior aspect of the right seventh rib. A smaller lesion (axial image 61 of series 5) measures 4.3 x 1.6 cm, invasive into the posterior aspect of the right ninth rib. In the base of the right lower lobe there are 2 areas of nodularity on axial image 95 of series 7 measuring 1.8 x 1.1 cm, and on axial image 97 of series 7) measuring 1.5 x 1.1 cm. An additional pleural-based mass is noted in the medial aspect of the lower right hemithorax abutting the descending thoracic aorta (axial image 72 of series 5) measuring 3.4 x 1.9 cm. Musculoskeletal: Destructive changes in the posterior aspect of the right seventh and ninth ribs are again noted, corresponding to hypermetabolic metastatic lesions on prior PET-CT. No other new osseous lesions are confidently identified on today's examination. Review of the MIP images confirms the above findings. CT ABDOMEN and PELVIS FINDINGS Hepatobiliary: No suspicious cystic or solid hepatic lesions. No intra or extrahepatic biliary ductal dilatation. Gallbladder is normal in appearance. Pancreas: No pancreatic mass. No pancreatic ductal dilatation. No pancreatic or peripancreatic fluid collections or inflammatory changes. Spleen: Unremarkable. Adrenals/Urinary Tract: Bilateral kidneys and adrenal glands are  normal in appearance. No hydroureteronephrosis. Urinary bladder is normal in appearance. Stomach/Bowel: The appearance of the stomach is normal. Duodenal diverticulum extending off the superior aspect of the third portion of the duodenal incidentally noted. No surrounding inflammatory changes to indicate an associated diverticulitis at this time. A few scattered colonic diverticuli are also noted, without surrounding inflammatory changes to indicate colonic diverticulitis at this time. Normal appendix. Vascular/Lymphatic: Aortic atherosclerosis, without evidence of aneurysm or dissection in the abdominal or pelvic vasculature. Retroaortic left renal vein (normal anatomical variant) incidentally noted. Multiple prominent lymph nodes in the upper abdomen, most notably a borderline enlarged portacaval lymph node measuring 1.4 cm in short axis, similar to prior PET-CT. Reproductive: Prostate gland and seminal vesicles are unremarkable in appearance. Other: No significant volume of ascites.  No pneumoperitoneum. Musculoskeletal: There are no aggressive appearing lytic or blastic lesions noted in the visualized portions of the skeleton. Review of the MIP images confirms the above findings. IMPRESSION: 1. Stable area of  postradiation mass-like fibrosis in the right upper lobe at the site of the treated lesion. Multifocal pulmonary and pleural-based metastatic lesions in the right hemithorax, similar to prior PET-CT, as above. 2. No definite pulmonary nodule identified in the left upper lobe to correspond to the area of hypermetabolism on prior PET-CT. 3. Nonspecific borderline enlarged portacaval lymph node, stable. No other potential signs of metastatic disease noted in the abdomen or pelvis. 4. Aortic atherosclerosis, in addition to left main and 2 vessel coronary artery disease. Please note that although the presence of coronary artery calcium documents the presence of coronary artery disease, the severity of this  disease and any potential stenosis cannot be assessed on this non-gated CT examination. Assessment for potential risk factor modification, dietary therapy or pharmacologic therapy may be warranted, if clinically indicated. 5. There are calcifications of the aortic valve. Echocardiographic correlation for evaluation of potential valvular dysfunction may be warranted if clinically indicated. 6. Colonic diverticulosis without evidence of acute diverticulitis at this time 7. Additional incidental findings, as above. Electronically Signed   By: Vinnie Langton M.D.   On: 09/15/2022 08:47   CT ABDOMEN PELVIS W CONTRAST  Result Date: 09/15/2022 CLINICAL DATA:  67 year old male with history of acute onset of nonlocalized abdominal pain and shortness of breath. History of non-small cell lung cancer status post radiation therapy. * Tracking Code: BO * . EXAM: CT ANGIOGRAPHY CHEST CT ABDOMEN AND PELVIS WITH CONTRAST TECHNIQUE: Multidetector CT imaging of the chest was performed using the standard protocol during bolus administration of intravenous contrast. Multiplanar CT image reconstructions and MIPs were obtained to evaluate the vascular anatomy. Multidetector CT imaging of the abdomen and pelvis was performed using the standard protocol during bolus administration of intravenous contrast. RADIATION DOSE REDUCTION: This exam was performed according to the departmental dose-optimization program which includes automated exposure control, adjustment of the mA and/or kV according to patient size and/or use of iterative reconstruction technique. CONTRAST:  127mL OMNIPAQUE IOHEXOL 350 MG/ML SOLN COMPARISON:  PET-CT 08/16/2022.  Chest CT 07/29/2022. FINDINGS: CTA CHEST FINDINGS Cardiovascular: No filling defects within the central, lobar or segmental sized pulmonary artery branch to suggest clinically significant pulmonary embolism. Smaller subsegmental sized emboli can not be entirely excluded secondary to extensive patient  respiratory motion. Heart size is normal. There is no significant pericardial fluid, thickening or pericardial calcification. There is aortic atherosclerosis, as well as atherosclerosis of the great vessels of the mediastinum and the coronary arteries, including calcified atherosclerotic plaque in the left main, left anterior descending and right coronary arteries. Thickening and calcification of the aortic valve. Mediastinum/Nodes: No pathologically enlarged mediastinal or hilar lymph nodes. Small hiatal hernia. Fat and fluid associated with the hiatal hernia, similar to the prior study. Esophagus is otherwise unremarkable in appearance. No axillary lymphadenopathy. Lungs/Pleura: Fiducial markers in the right upper lobe with adjacent mass-like area of architectural distortion, similar to prior studies, most compatible with chronic postradiation mass-like fibrosis. Pleural-based soft tissue masses in the posterior aspect of the lower right hemithorax again noted measuring up to 4.8 x 2.1 cm (axial image 45 of series 5), invasive into the posterior aspect of the right seventh rib. A smaller lesion (axial image 61 of series 5) measures 4.3 x 1.6 cm, invasive into the posterior aspect of the right ninth rib. In the base of the right lower lobe there are 2 areas of nodularity on axial image 95 of series 7 measuring 1.8 x 1.1 cm, and on axial image 97 of series 7)  measuring 1.5 x 1.1 cm. An additional pleural-based mass is noted in the medial aspect of the lower right hemithorax abutting the descending thoracic aorta (axial image 72 of series 5) measuring 3.4 x 1.9 cm. Musculoskeletal: Destructive changes in the posterior aspect of the right seventh and ninth ribs are again noted, corresponding to hypermetabolic metastatic lesions on prior PET-CT. No other new osseous lesions are confidently identified on today's examination. Review of the MIP images confirms the above findings. CT ABDOMEN and PELVIS FINDINGS  Hepatobiliary: No suspicious cystic or solid hepatic lesions. No intra or extrahepatic biliary ductal dilatation. Gallbladder is normal in appearance. Pancreas: No pancreatic mass. No pancreatic ductal dilatation. No pancreatic or peripancreatic fluid collections or inflammatory changes. Spleen: Unremarkable. Adrenals/Urinary Tract: Bilateral kidneys and adrenal glands are normal in appearance. No hydroureteronephrosis. Urinary bladder is normal in appearance. Stomach/Bowel: The appearance of the stomach is normal. Duodenal diverticulum extending off the superior aspect of the third portion of the duodenal incidentally noted. No surrounding inflammatory changes to indicate an associated diverticulitis at this time. A few scattered colonic diverticuli are also noted, without surrounding inflammatory changes to indicate colonic diverticulitis at this time. Normal appendix. Vascular/Lymphatic: Aortic atherosclerosis, without evidence of aneurysm or dissection in the abdominal or pelvic vasculature. Retroaortic left renal vein (normal anatomical variant) incidentally noted. Multiple prominent lymph nodes in the upper abdomen, most notably a borderline enlarged portacaval lymph node measuring 1.4 cm in short axis, similar to prior PET-CT. Reproductive: Prostate gland and seminal vesicles are unremarkable in appearance. Other: No significant volume of ascites.  No pneumoperitoneum. Musculoskeletal: There are no aggressive appearing lytic or blastic lesions noted in the visualized portions of the skeleton. Review of the MIP images confirms the above findings. IMPRESSION: 1. Stable area of postradiation mass-like fibrosis in the right upper lobe at the site of the treated lesion. Multifocal pulmonary and pleural-based metastatic lesions in the right hemithorax, similar to prior PET-CT, as above. 2. No definite pulmonary nodule identified in the left upper lobe to correspond to the area of hypermetabolism on prior PET-CT. 3.  Nonspecific borderline enlarged portacaval lymph node, stable. No other potential signs of metastatic disease noted in the abdomen or pelvis. 4. Aortic atherosclerosis, in addition to left main and 2 vessel coronary artery disease. Please note that although the presence of coronary artery calcium documents the presence of coronary artery disease, the severity of this disease and any potential stenosis cannot be assessed on this non-gated CT examination. Assessment for potential risk factor modification, dietary therapy or pharmacologic therapy may be warranted, if clinically indicated. 5. There are calcifications of the aortic valve. Echocardiographic correlation for evaluation of potential valvular dysfunction may be warranted if clinically indicated. 6. Colonic diverticulosis without evidence of acute diverticulitis at this time 7. Additional incidental findings, as above. Electronically Signed   By: Vinnie Langton M.D.   On: 09/15/2022 08:47   DG Chest 2 View  Result Date: 09/15/2022 CLINICAL DATA:  Increasing shortness of breath. History of lung cancer EXAM: CHEST - 2 VIEW COMPARISON:  10/20/2021 FINDINGS: Opacity at the right apex where there are fiducial markers. Diffuse interstitial prominence which is chronic. Chronic lung disease by recent chest CT. Cardiomegaly and vascular pedicle widening. Hiatal hernia. IMPRESSION: 1. Chronic cardiomegaly and interstitial lung disease. 2. Chronic right apical opacity at site of radiotherapy. 3. No acute finding when compared to priors. Electronically Signed   By: Jorje Guild M.D.   On: 09/15/2022 05:15   MR Brain W Wo  Contrast  Result Date: 09/01/2022 CLINICAL DATA:  Lung carcinoma staging EXAM: MRI HEAD WITHOUT AND WITH CONTRAST TECHNIQUE: Multiplanar, multiecho pulse sequences of the brain and surrounding structures were obtained without and with intravenous contrast. CONTRAST:  39mL GADAVIST GADOBUTROL 1 MMOL/ML IV SOLN COMPARISON:  None Available.  FINDINGS: Brain: No acute infarct, mass effect or extra-axial collection. No acute or chronic hemorrhage. There is multifocal hyperintense T2-weighted signal within the white matter. Parenchymal volume and CSF spaces are normal. The midline structures are normal. Vascular: Major flow voids are preserved. Skull and upper cervical spine: Normal calvarium and skull base. Visualized upper cervical spine and soft tissues are normal. Sinuses/Orbits:No paranasal sinus fluid levels or advanced mucosal thickening. No mastoid or middle ear effusion. Normal orbits. IMPRESSION: 1. No intracranial metastatic disease. 2. Findings of chronic small vessel ischemia. Electronically Signed   By: Ulyses Jarred M.D.   On: 09/01/2022 02:49    Microbiology: Results for orders placed or performed during the [facility] encounter of 09/22/22  Resp panel by RT-PCR (RSV, Flu A&B, Covid) Anterior Nasal Swab     Status: None   Collection Time: 09/22/22  7:14 PM   Specimen: Anterior Nasal Swab  Result Value Ref Range Status   SARS Coronavirus 2 by RT PCR NEGATIVE NEGATIVE Final    Comment: (NOTE) SARS-CoV-2 target nucleic acids are NOT DETECTED.  The SARS-CoV-2 RNA is generally detectable in upper respiratory specimens during the acute phase of infection. The lowest concentration of SARS-CoV-2 viral copies this assay can detect is 138 copies/mL. A negative result does not preclude SARS-Cov-2 infection and should not be used as the sole basis for treatment or other patient management decisions. A negative result may occur with  improper specimen collection/handling, submission of specimen other than nasopharyngeal swab, presence of viral mutation(s) within the areas targeted by this assay, and inadequate number of viral copies(<138 copies/mL). A negative result must be combined with clinical observations, patient history, and epidemiological information. The expected result is Negative.  Fact Sheet for Patients:   EntrepreneurPulse.com.au  Fact Sheet for Healthcare Providers:  IncredibleEmployment.be  This test is no t yet approved or cleared by the [place] FDA and  has been authorized for detection and/or diagnosis of SARS-CoV-2 by FDA under an Emergency Use Authorization (EUA). This EUA will remain  in effect (meaning this test can be used) for the duration of the COVID-19 declaration under Section 564(b)(1) of the Act, 21 U.S.C.section 360bbb-3(b)(1), unless the authorization is terminated  or revoked sooner.       Influenza A by PCR NEGATIVE NEGATIVE Final   Influenza B by PCR NEGATIVE NEGATIVE Final    Comment: (NOTE) The Xpert Xpress SARS-CoV-2/FLU/RSV plus assay is intended as an aid in the diagnosis of influenza from Nasopharyngeal swab specimens and should not be used as a sole basis for treatment. Nasal washings and aspirates are unacceptable for Xpert Xpress SARS-CoV-2/FLU/RSV testing.  Fact Sheet for Patients: EntrepreneurPulse.com.au  Fact Sheet for Healthcare Providers: IncredibleEmployment.be  This test is not yet approved or cleared by the [place] FDA and has been authorized for detection and/or diagnosis of SARS-CoV-2 by FDA under an Emergency Use Authorization (EUA). This EUA will remain in effect (meaning this test can be used) for the duration of the COVID-19 declaration under Section 564(b)(1) of the Act, 21 U.S.C. section 360bbb-3(b)(1), unless the authorization is terminated or revoked.     Resp Syncytial Virus by PCR NEGATIVE NEGATIVE Final    Comment: (NOTE) Fact Sheet for  Patients: EntrepreneurPulse.com.au  Fact Sheet for Healthcare Providers: IncredibleEmployment.be  This test is not yet approved or cleared by the [place] FDA and has been authorized for detection and/or diagnosis of SARS-CoV-2 by FDA under an Emergency Use  Authorization (EUA). This EUA will remain in effect (meaning this test can be used) for the duration of the COVID-19 declaration under Section 564(b)(1) of the Act, 21 U.S.C. section 360bbb-3(b)(1), unless the authorization is terminated or revoked.  Performed at [facility], [address], [place], [place] [address]   MRSA Next Gen by PCR, Nasal     Status: None   Collection Time: 09/23/22  1:15 AM   Specimen: Nasal Mucosa; Nasal Swab  Result Value Ref Range Status   MRSA by PCR Next Gen NOT DETECTED NOT DETECTED Final    Comment: (NOTE) The GeneXpert MRSA Assay (FDA approved for NASAL specimens only), is one component of a comprehensive MRSA colonization surveillance program. It is not intended to diagnose MRSA infection nor to guide or monitor treatment for MRSA infections. Test performance is not FDA approved in patients less than 13 years old. Performed at [facility], [address], [place],  [address]     Labs: CBC: Recent Labs  Lab 09/22/22 1908 09/23/22 0511 09/24/22 0503 Oct 25, 2022 0328  WBC 12.3* 13.2* 11.0* 15.2*  HGB 11.1* 10.4* 10.9* 11.4*  HCT 32.9* 30.6* 32.7* 34.4*  MCV 91.6 91.3 92.6 93.2  PLT 219 210 178 505   Basic Metabolic Panel: Recent Labs  Lab 09/22/22 1907 09/22/22 1908 09/23/22 0511 09/24/22 0503 25-Oct-2022 0328  NA  --  131* 131* 130* 134*  K  --  4.0 4.0 3.9 4.2  CL  --  99 98 97* 97*  CO2  --  21* 22 23 26   GLUCOSE  --  162* 204* 252* 271*  BUN  --  16 22 28* 30*  CREATININE  --  1.05 1.07 0.99 1.03  CALCIUM  --  8.3* 8.3* 8.5* 8.8*  MG 2.1  --  2.3  --   --   PHOS  --   --  4.1  --   --    Liver Function Tests: Recent Labs  Lab 09/23/22 0511 09/24/22 0503 10-25-22 0328  AST 35 33 37  ALT 33 33 39  ALKPHOS 126 134* 160*  BILITOT 0.9 0.7 0.7  PROT 7.2 7.6 7.8  ALBUMIN 2.8* 2.8* 2.9*   CBG: Recent Labs  Lab 09/24/22 0714 09/24/22 1129 09/24/22 1611 09/24/22 2138 25-Oct-2022 0803  GLUCAP 245* 284* 266* 223* 324*     Discharge time spent: greater than 30 minutes.  Signed: Deatra James, MD [facility] Oct 25, 2022

## 2022-10-22 NOTE — TOC Transition Note (Signed)
Transition of Care [facility]) - CM/SW Discharge Note   Patient Details  Name: Daniel [facility] MRN: [id number] Date of Birth: [birth year]  Transition of Care [facility]) CM/SW Contact:  Shade Flood, LCSW Phone Number: 30-Sep-2022, 12:26 PM   Clinical Narrative:     Pt admitted from home. Palliative Care APNP has been consulting to assist with [place] discussions. At this time, pt is electing to return home with hospice care. Pt is expressing strong desire to get home today even if he only has a brief amount of time.   Referral sent earlier this AM to [facility] and they are coordinating with [place] for pt's DME/O2 needs at home. Timeframe for delivery of the DME not yet known. Pt will also need a significant amount of O2 for transport.   [facility] assisted with communication with EMS regarding O2 needs for transport and was informed that they can do it but that they cannot put pt on the list until the O2 is confirmed delivered to the home. They have scheduled convalescent EMS trucks until 3pm and then after 3, it will only be possible if they have a truck available.  Pt's bedside RN assisting with calling EMS once O2 at the home.  Hospice and [facility] doing what they can to expedite delivery.  TOC will assist as needed.   Expected Discharge Plan: Home w Hospice Care Barriers to Discharge: Barriers Resolved   Patient Goals and CMS Choice Patient states their goals for this hospitalization and ongoing recovery are:: go home with hospice CMS Medicare.gov Compare Post Acute Care list provided to:: Patient Choice offered to / list presented to : Patient  Expected Discharge Plan and Services Expected Discharge Plan: Home w Hospice Care In-house Referral: Clinical Social Work   Post Acute Care Choice: Hospice Living arrangements for the past 2 months: [place] Expected Discharge Date: 2022-09-30                                    Prior Living  Arrangements/Services Living arrangements for the past 2 months: Single Family Home Lives with:: Spouse Patient language and need for interpreter reviewed:: Yes Do you feel safe going back to the place where you live?: Yes      Need for Family Participation in Patient Care: Yes (Comment) Care giver support system in place?: Yes (comment)   Criminal Activity/Legal Involvement Pertinent to Current Situation/Hospitalization: No - Comment as needed  Activities of Daily Living Home Assistive Devices/Equipment: None ADL Screening (condition at time of admission) Patient's cognitive ability adequate to safely complete daily activities?: Yes Is the patient deaf or have difficulty hearing?: Yes Does the patient have difficulty seeing, even when wearing glasses/contacts?: No Does the patient have difficulty concentrating, remembering, or making decisions?: No Patient able to express need for assistance with ADLs?: Yes Does the patient have difficulty dressing or bathing?: No Independently performs ADLs?: Yes (appropriate for developmental age) Does the patient have difficulty walking or climbing stairs?: No Weakness of Legs: None Weakness of Arms/Hands: None  Permission Sought/Granted Permission sought to share information with : Facility Art therapist granted to share information with : Yes, Verbal Permission Granted     Permission granted to share info w AGENCY: hospice        Emotional Assessment       Orientation: : Oriented to Self, Oriented to Place, Oriented to  Time, Oriented to Situation  Alcohol / Substance Use: Not Applicable Psych Involvement: No (comment)  Admission diagnosis:  Acute respiratory failure with hypoxia (HCC) [J96.01] Acute respiratory failure ([place]) [J96.00] Patient Active Problem List   Diagnosis Date Noted   Leukocytosis 09/23/2022   Hyponatremia 09/23/2022   Elevated brain natriuretic peptide (BNP) level 09/23/2022   Type 2 diabetes  mellitus with hyperglycemia ([place]) 09/23/2022   Mixed hyperlipidemia 09/23/2022   Acute respiratory failure ([place]) 09/23/2022   Acute respiratory failure with hypoxia (HCC) 09/22/2022   Atrial fibrillation with RVR ([place]) 09/15/2022   Hypomagnesemia 09/15/2022   Thrombocytopenia ([place]) 09/15/2022   Encounter for antineoplastic chemotherapy 08/26/2022   Pulmonary nodules 09/26/2021   Malignant neoplasm of right upper lobe of lung ([place]) 08/21/2020   Mediastinal lymphadenopathy    ILD (interstitial lung disease) ([place]) 02/28/2020   OSA (obstructive sleep apnea) 02/28/2020   Coronary artery disease of native artery of native heart with stable angina pectoris ([place]) 01/18/2020   Accelerating angina ([place]) 11/02/2019   Non-small cell lung cancer ([place]) 03/19/2019   NSIP (nonspecific interstitial pneumonia) ([place]) 07/20/2016   Obesity, Class III, BMI 40-49.9 (morbid obesity) ([place]) 01/31/2016   COPD (chronic obstructive pulmonary disease) ([place]) 01/30/2016   Cough 07/03/2014   Neurogenic claudication due to lumbar spinal stenosis 02/07/2014   Dyspnea on exertion 01/09/2014   Cigarette smoker 05/02/2010   Asbestos exposure 05/02/2010   Essential hypertension 04/30/2010   Coronary atherosclerosis 04/30/2010   Unspecified chronic bronchitis ([place]) 04/30/2010   GERD 04/30/2010   PCP:  [name], MD Pharmacy:   [facility], [place] [facility] [id number] [place] [address] Phone: [phone] Fax: [phone]     Social Determinants of Health (SDOH) Interventions Housing Interventions: Intervention Not Indicated  Readmission Risk Interventions    10-23-22   12:25 PM 09/24/2022    1:13 PM  Readmission Risk Prevention Plan  Transportation Screening  Complete  HRI or Home Care Consult  Complete  Social Work Consult for [place] Planning/Counseling  Complete  Palliative Care Screening  Complete  Medication Review Press photographer)  Complete  HRI  or [place] Complete   SW Recovery Care/Counseling Consult Complete   Palliative Care Screening Complete   Skilled Nursing Facility Not Applicable      Final next level of care: Home w Hospice Care Barriers to Discharge: Barriers Resolved   Patient Goals and CMS Choice CMS Medicare.gov Compare Post Acute Care list provided to:: Patient Choice offered to / list presented to : Patient  Discharge Placement                         Discharge Plan and Services Additional resources added to the After Visit Summary for   In-house Referral: Clinical Social Work   Post Acute Care Choice: Hospice                               Social Determinants of Health ([place]) Interventions SDOH Screenings   Food Insecurity: No Food Insecurity (09/23/2022)  Housing: Low Risk  (09/23/2022)  Transportation Needs: No Transportation Needs (09/23/2022)  Utilities: Not At Risk (09/23/2022)  Depression (PHQ2-9): Low Risk  (12/06/2020)  Tobacco Use: High Risk (09/22/2022)     Readmission Risk Interventions    10-23-22   12:25 PM 09/24/2022    1:13 PM  Readmission Risk Prevention Plan  Transportation Screening  Complete  HRI  or [place]  Complete  Social Work Consult for [place] Planning/Counseling  Complete  Palliative Care Screening  Complete  Medication Review Press photographer)  Complete  HRI or [place] Complete   SW Recovery Care/Counseling Consult Complete   Palliative Care Screening Complete   [place] Not Applicable

## 2022-10-22 NOTE — Progress Notes (Addendum)
Palliative:  HPI:  67 y.o. male  with past medical history of atrial fibrillation, CAD, hypertension, hyperlipidemia, COPD, GERD, stage IV lung cancer on chemotherapy/radiation (follows with Dr. Earlie Server; recent recurrence Nov 2023), sleep apnea uses CPAP admitted on 09/22/2022 with shortness of breath, nonproductive cough, hypoxia requiring BiPAP support complicated by atrial fibrillation RVR requiring diltiazem infusion.   I met today with Daniel Ball along with wife and 2 daughters at bedside. Daniel Ball is very clearly telling us that he wants to go home. He understands that he is critically ill and prognosis is poor. I clarified expectations that if he goes home this will be with comfort and hospice with the expectation that he will be at home until God calls him home which may not be very long. He understands and is at peace but does not want to remain in the hospital. He wishes to die in the comfort of his home surrounded by his family. Family at bedside are understandably anxious but respectful of his wish to go home. They all agree with having hospice support. We discussed the challenge of the amount of oxygen he is receiving. I am hoping that we can work with hospice to provide two oxygen concentrators where he can receive 10-15L via nasal cannula on one and 10-15L via mask on the other - just as he has been on here in the hospital. This will be to help him be able to have a little time at home. I do not feel that he will be able to survive long even on this support at home - they are all aware. I did speak with family about having sublingual morphine at home to be used for pain and shortness of breath and explained how to administer. We discussed hospice guidance and support although they will not be in the home 24/7 they will be a phone call away to provide guidance and can come to the home even in the middle of the night if needed. He is not interested in utilizing hospice facility but wishes to go  home and remain home for his remaining time.   All questions/concerns addressed. Emotional support provided. Coordinating care with Dr. Roger Shelter, [facility], RN, and hospice.   Exam: Alert, oriented. On BiPAP (he does not like BiPAP - this will not be needed at home). Breathing less labored on BiPAP and able to converse well. Continues with labored breathing at rest but manageable at current time. Abd soft. Moves all extremities.   Plan: - Home with hospice TODAY desired.  - Recommend 2 oxygen concentrators in the home to allow for increased oxygen needs. Will need hospital bed and nebs at home as well.  - Will recommend PRN sublingual medication for comfort at home.  - Will need transport home when equipment arranged.   33 min  Vinie Sill, NP Palliative Medicine Team Pager [phone] (Please see [email or web address] for schedule) Team Phone [phone]    Greater than 50%  of this time was spent counseling and coordinating care related to the above assessment and plan

## 2022-10-22 DEATH — deceased

## 2022-10-28 ENCOUNTER — Telehealth: Payer: Self-pay | Admitting: Medical Oncology

## 2022-10-28 ENCOUNTER — Other Ambulatory Visit: Payer: Medicare Other

## 2022-10-28 NOTE — Telephone Encounter (Addendum)
Left my phone number to return my call regarding his letter supporting stage 4 lung cancer. [employer] wants to know if the letter is authentic? It is . [employer] rep could not find the pt in the database.

## 2022-10-29 ENCOUNTER — Telehealth: Payer: Self-pay | Admitting: *Deleted

## 2022-10-29 NOTE — Telephone Encounter (Signed)
Spoke with rep from [employer] . RN confirmed with [employer] that we sent the letter stating patient had Stage IV lung cancer. They were able to find patient using insurance ID # [id number]. ' Wife notified that [employer] says they have everything they need.

## 2022-11-03 ENCOUNTER — Ambulatory Visit: Payer: Medicare Other | Admitting: Physician Assistant

## 2022-11-03 ENCOUNTER — Ambulatory Visit: Payer: Medicare Other

## 2022-11-03 ENCOUNTER — Other Ambulatory Visit: Payer: Medicare Other

## 2022-11-05 ENCOUNTER — Ambulatory Visit: Payer: Medicare Other

## 2022-11-11 ENCOUNTER — Other Ambulatory Visit: Payer: Medicare Other

## 2022-11-18 ENCOUNTER — Other Ambulatory Visit: Payer: Medicare Other

## 2023-01-25 IMAGING — CT CT CHEST W/O CM
2 of 4 series · 14 of 36 positions shown, 17 images · non-contrast
Comparison: July 01, 2020

CLINICAL DATA: Non-small cell lung cancer, assess treatment
response in this 65-year-old male

EXAM:
CT CHEST WITHOUT CONTRAST
TECHNIQUE: Multidetector CT imaging of the chest was performed following the
standard protocol without IV contrast.

[Series 2: routine chest without · axial · non-contrast · 0.82mm/px · z∈[+1304,+1554]mm · 11 of 149 slices shown, 14 images]
[im 12/149  mediastinal]
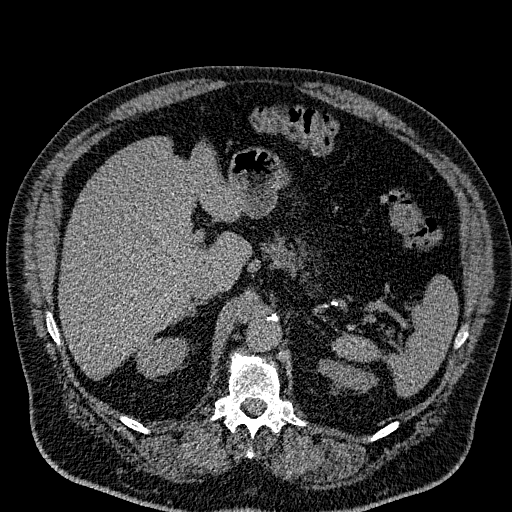
[im 12/149  lung]
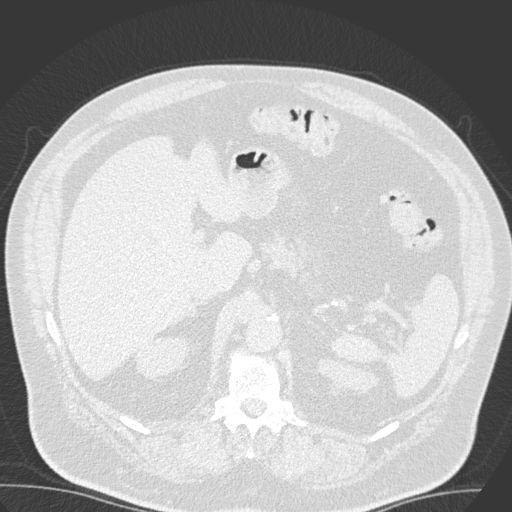
[im 23/149  lung]
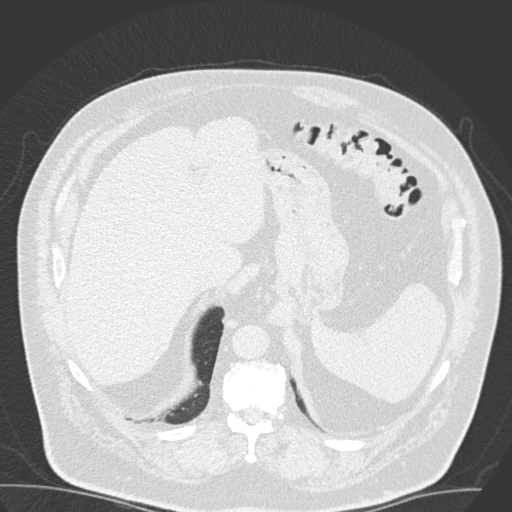
[im 35/149  lung]
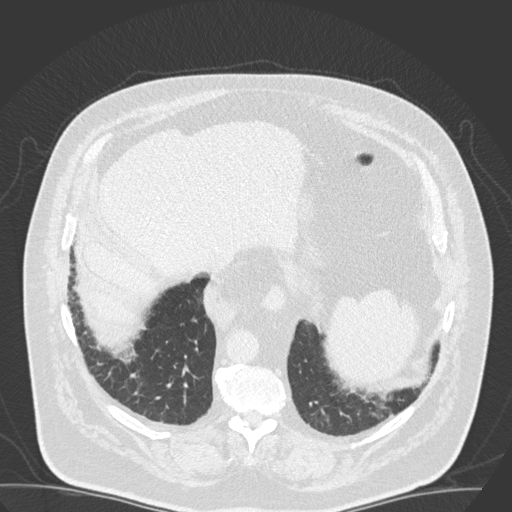
[im 46/149  lung]
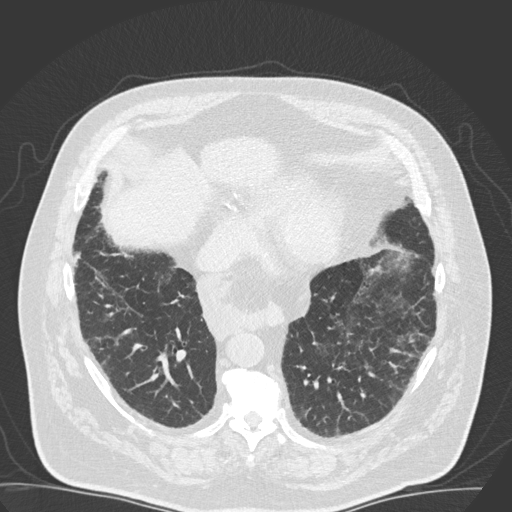
[im 57/149  mediastinal]
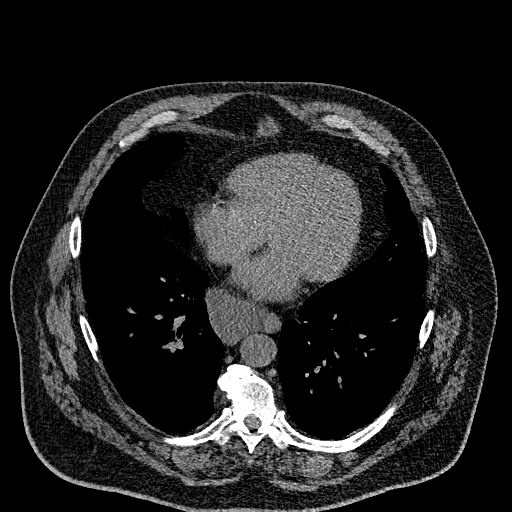
[im 57/149  lung]
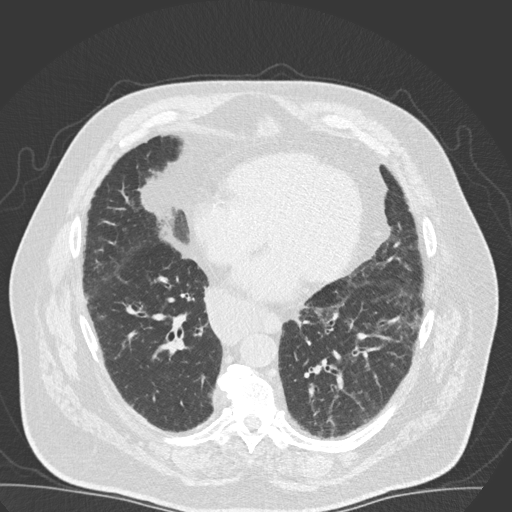
[im 80/149  lung]
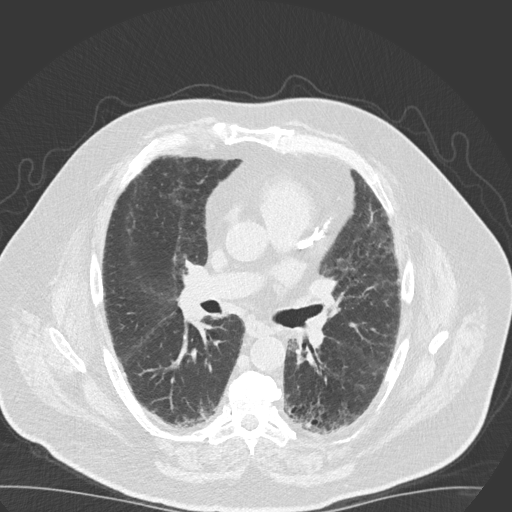
[im 92/149  lung]
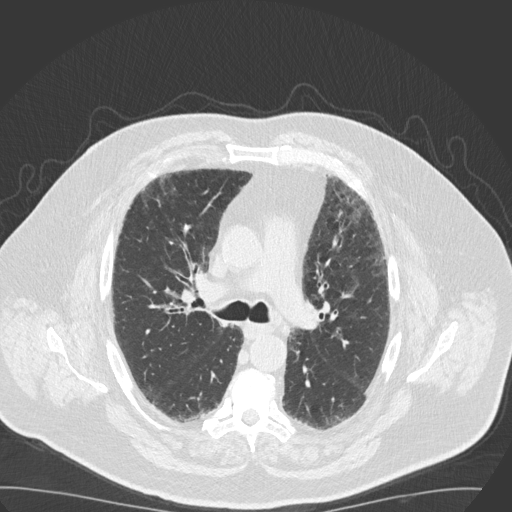
[im 103/149  lung]
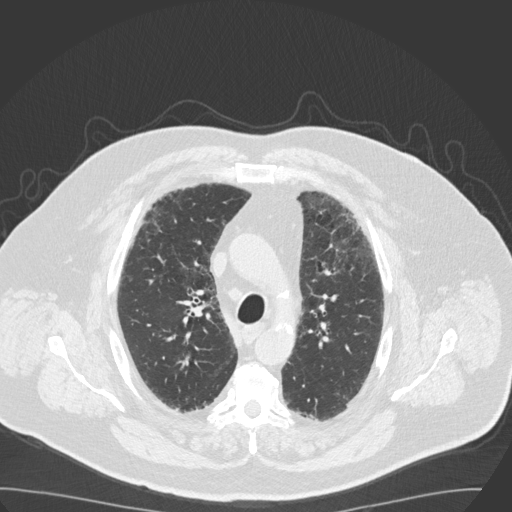
[im 114/149  mediastinal]
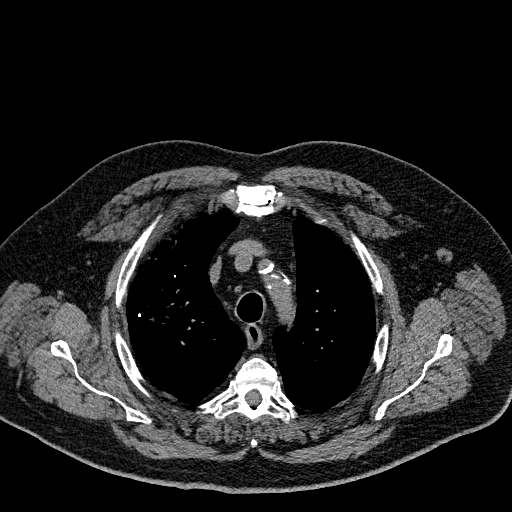
[im 114/149  lung]
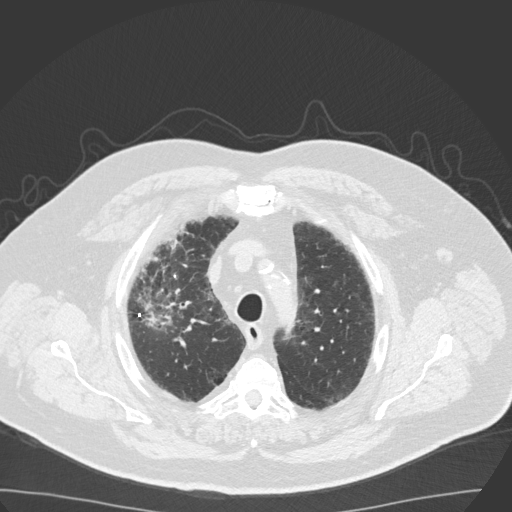
[im 126/149  lung]
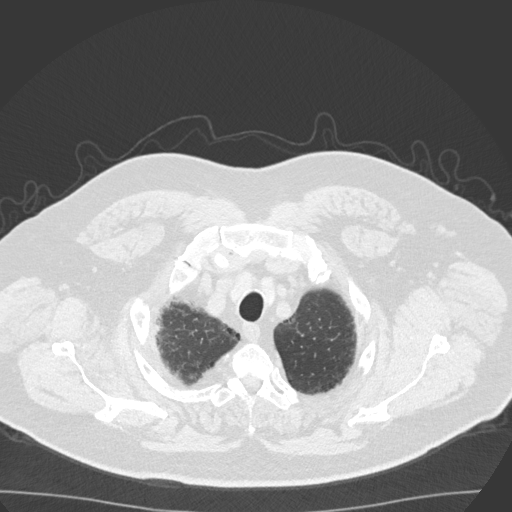
[im 137/149  lung]
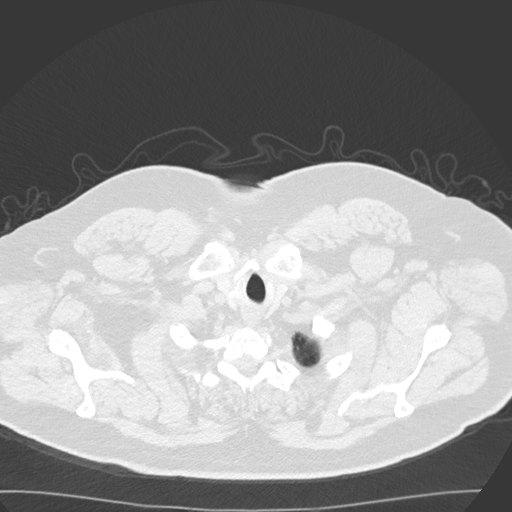

[Series 5: coronal · coronal · 0.64mm/px · 3 of 180 slices shown]
[im 36/180  lung]
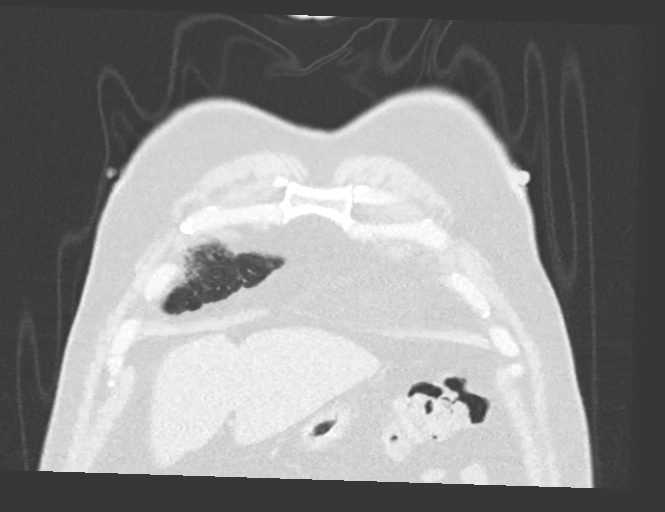
[im 72/180  lung]
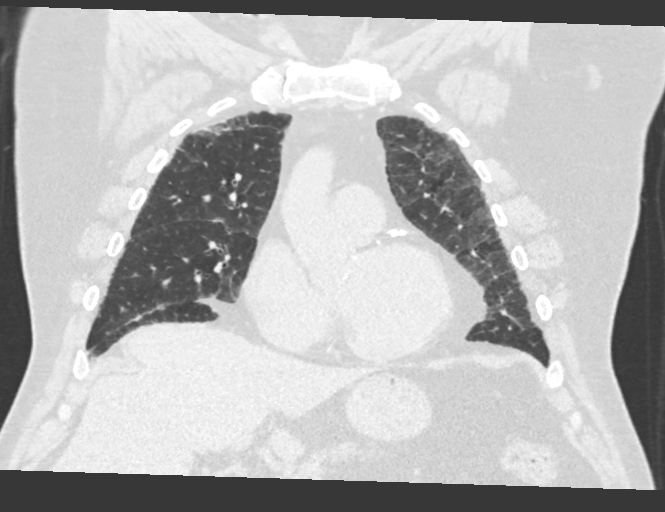
[im 108/180  lung]
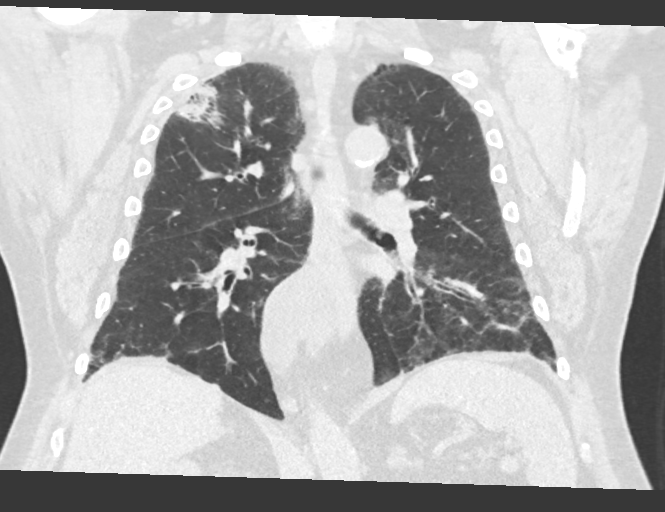

[14 of 36 positions shown; findings below may reference images not displayed]

FINDINGS: Cardiovascular: Calcified atheromatous plaque in the thoracic aorta.
Aorta is nonaneurysmal.

Central pulmonary vasculature normal caliber. Mild cardiac
enlargement. No substantial pericardial effusion. Heart size
unchanged from prior imaging. Extensive coronary artery disease,
three-vessel disease. Limited assessment of cardiovascular
structures given lack of intravenous contrast.

Mediastinum/Nodes: Scattered small lymph nodes throughout the chest.

RIGHT paratracheal lymph node 12 mm short axis image 53/2. Not
changed from previous imaging. Esophagus with grossly normal
appearance. Small amount of paraesophageal fluid with herniation of
fat adjacent to the esophagus from the abdomen, not changed. This is
stable over a series of prior studies dating back to at least
Thursday May, 2019 no thoracic inlet or axillary lymphadenopathy.

Lungs/Pleura: Subpleural reticulation bilaterally is diffuse, mild
septal thickening also diffuse and unchanged.

At the site of previous nodule in the RIGHT upper lobe is an area of
consolidation, mild surrounding ground-glass and septal thickening
that measures in total 3.9 x 2.4 cm. The discrete nodule that was
visible in this area on the prior study is no longer seen.

8 x 6 mm nodule in the RIGHT chest along the minor fissure is
stablecompared to most recent imaging but appears slightly increased
compared to the study from Thursday May, 2019. Pulmonary emphysema.

Small nodule along the RIGHT hemidiaphragm (image 121, series 4) 6
mm not seen on the prior study.

Upper Abdomen: No acute upper abdominal process. Herniation of fat
about the esophagus at the level of the esophageal hiatus, passing
into the chest. Small hiatal hernia likely associated. Lobular
hepatic contours as before. Mild perinephric stranding about the
upper pole of LEFT and RIGHT kidney is unchanged.

Musculoskeletal: No acute musculoskeletal finding. Spinal
degenerative changes.
IMPRESSION: 1. Post radiation changes at the site of the previous RIGHT upper
lobe nodule. Attention on follow-up.
2. 8 x 6 mm nodule in the RIGHT chest along the minor fissure is
stable compared to most recent imaging but appears slightly
increased compared to the study from Thursday May, 2019. Consider
short interval follow-up to ensure continued stability. Additional
site of disease is considered.
3. Small nodule along the RIGHT hemidiaphragm 6 mm not seen on the
prior study. Short interval follow-up will be helpful for further
assessment of this finding as well.
4. Chronic interstitial findings as before.
5. Three-vessel coronary artery disease.
6. Aortic atherosclerosis.
7. Pulmonary emphysema.

Aortic Atherosclerosis (4R5NS-LTN.N) and Emphysema (4R5NS-VJG.U).

## 2023-08-01 IMAGING — CT CT CHEST W/O CM
2 of 4 series · 14 of 36 positions shown, 17 images · non-contrast
Comparison: 01/17/2021

CLINICAL DATA: Non-small cell lung cancer, assess treatment
response, status post radiation therapy

EXAM:
CT CHEST WITHOUT CONTRAST
TECHNIQUE: Multidetector CT imaging of the chest was performed following the
standard protocol without IV contrast.

[Series 2: routine chest without · axial · non-contrast · 0.86mm/px · z∈[-164,+100]mm · 11 of 156 slices shown, 14 images]
[im 12/156  mediastinal]
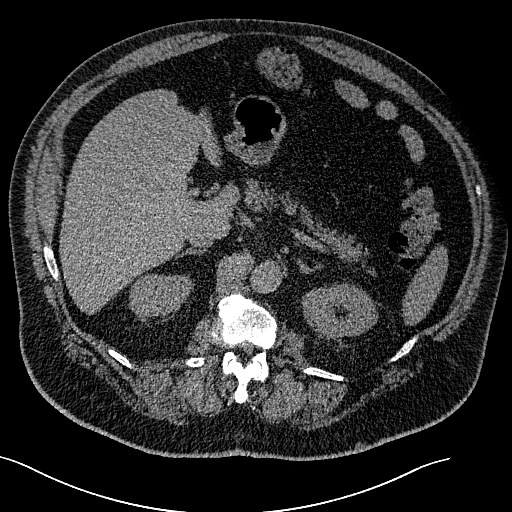
[im 12/156  lung]
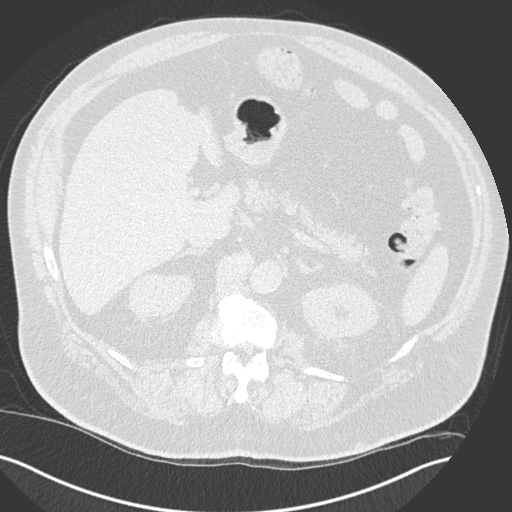
[im 24/156  lung]
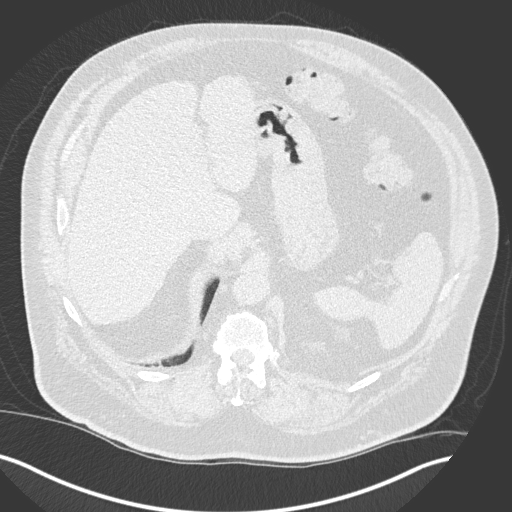
[im 36/156  lung]
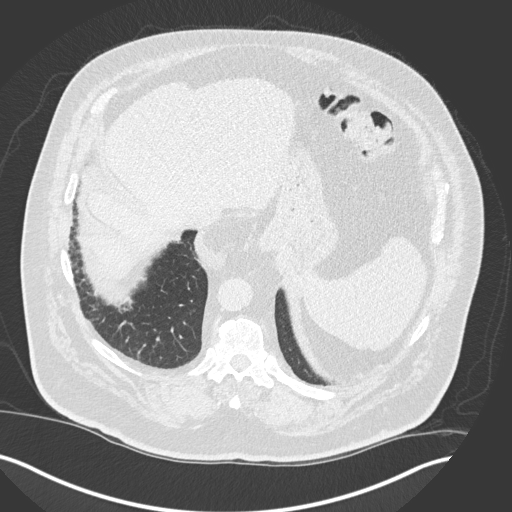
[im 48/156  lung]
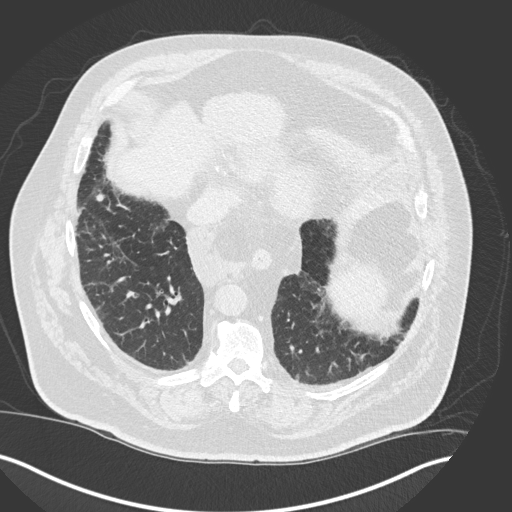
[im 60/156  mediastinal]
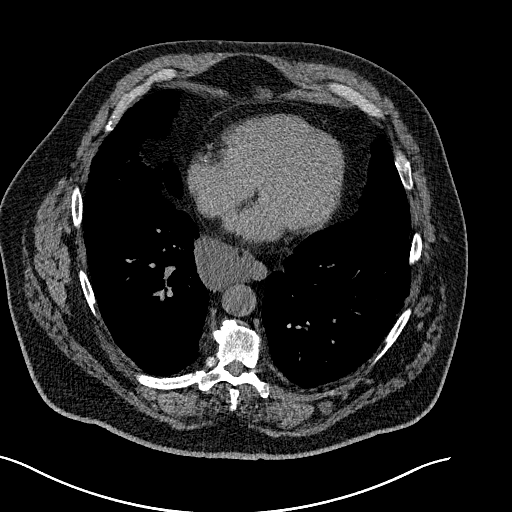
[im 60/156  lung]
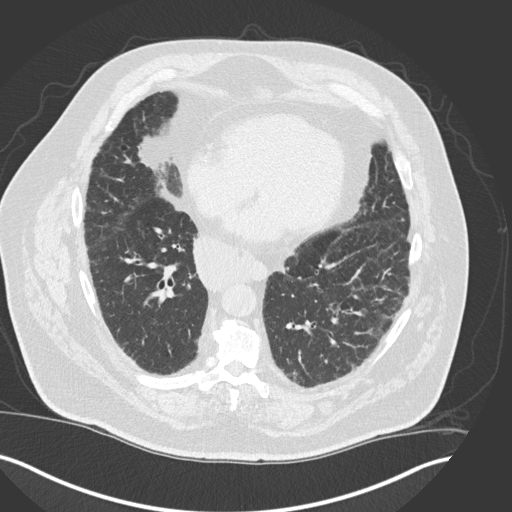
[im 84/156  lung]
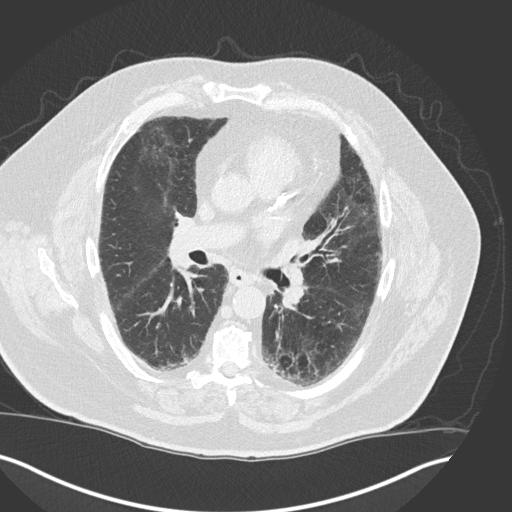
[im 96/156  lung]
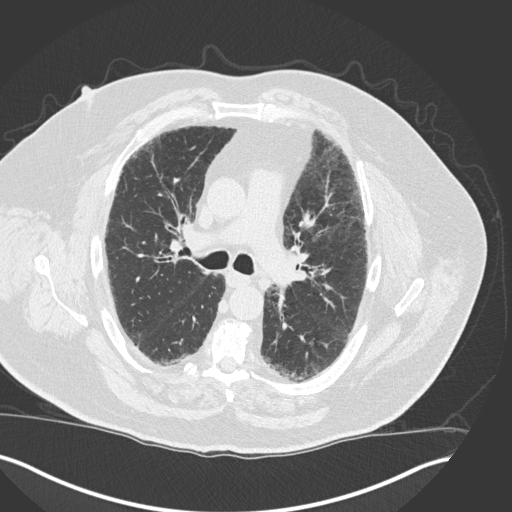
[im 108/156  lung]
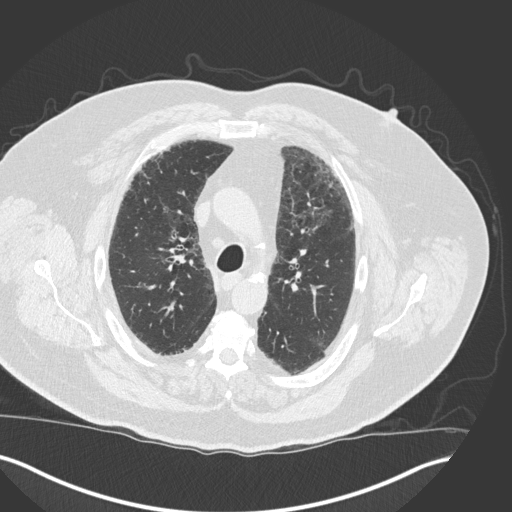
[im 120/156  mediastinal]
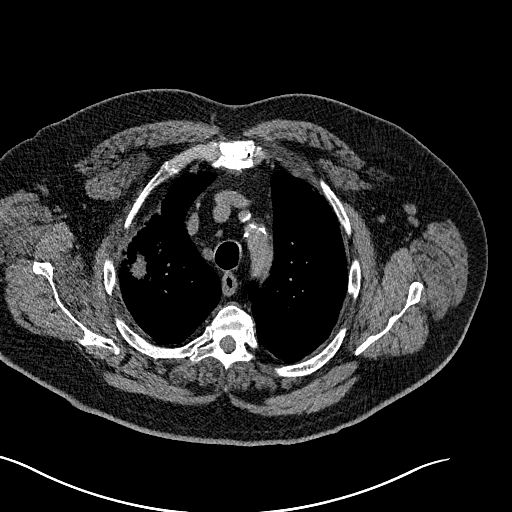
[im 120/156  lung]
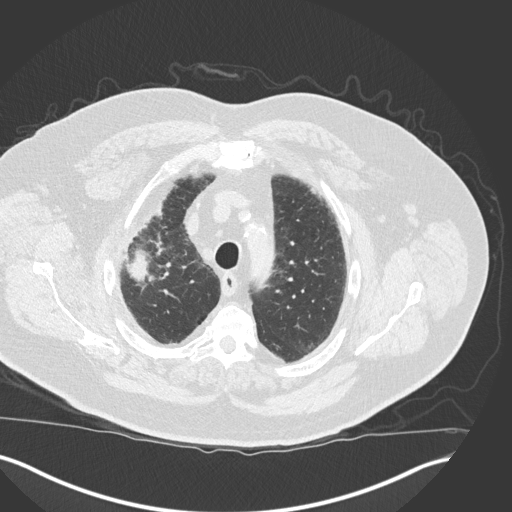
[im 132/156  lung]
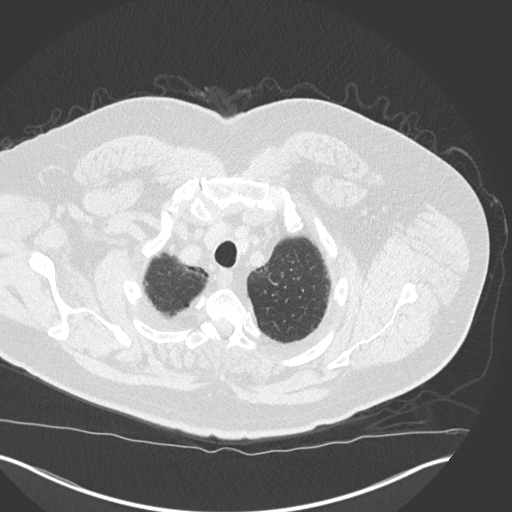
[im 144/156  lung]
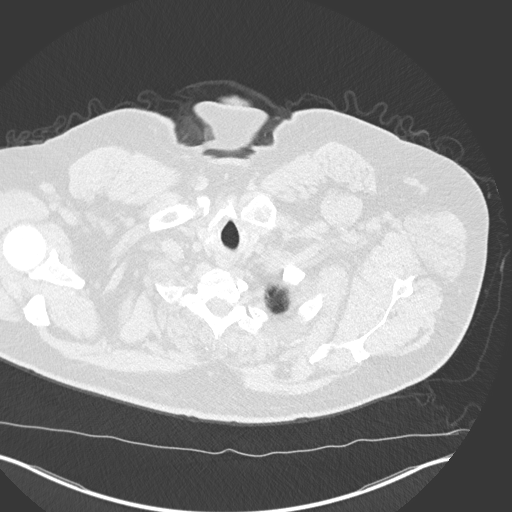

[Series 5: coronal · coronal · 0.67mm/px · 3 of 184 slices shown]
[im 37/184  lung]
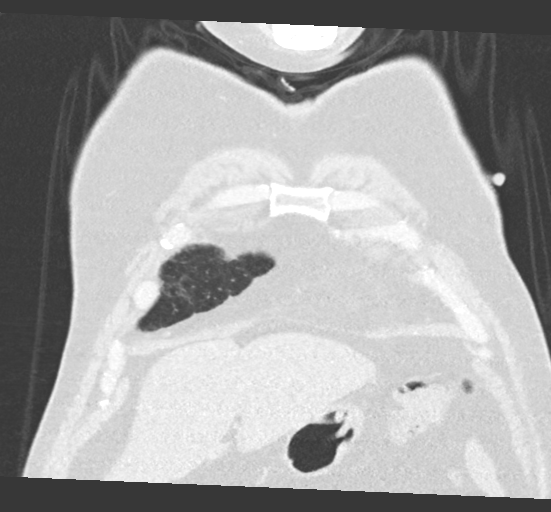
[im 74/184  lung]
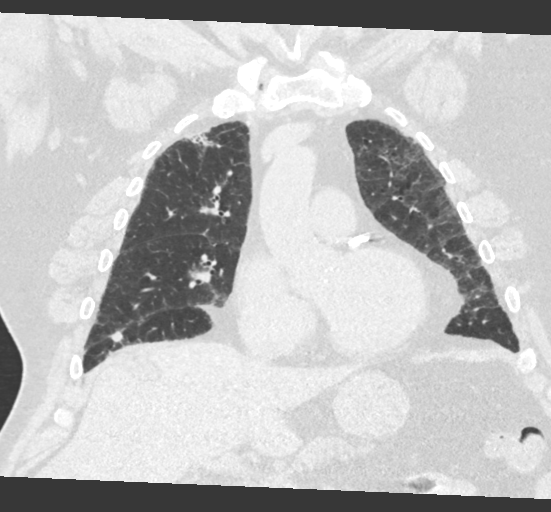
[im 110/184  lung]
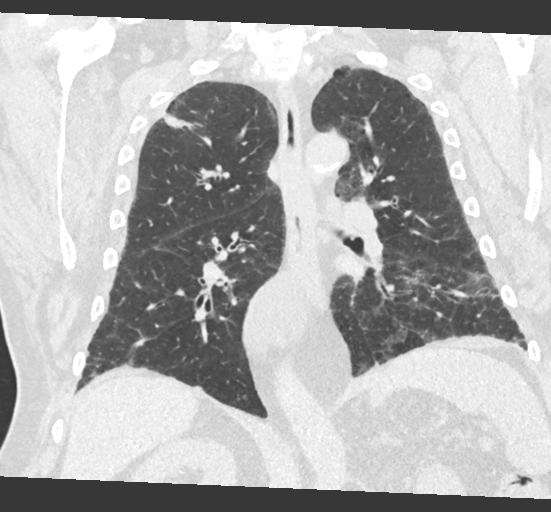

[14 of 36 positions shown; findings below may reference images not displayed]

FINDINGS: Cardiovascular: Aortic atherosclerosis. Aortic valve calcifications.
Normal heart size. Extensive 3 vessel coronary artery calcifications
and/or stents. No pericardial effusion.

Mediastinum/Nodes: No enlarged mediastinal, hilar, or axillary lymph
nodes. Unchanged fat and fluid containing hiatal hernia (series 2,
image 114). Thyroid gland, trachea, and esophagus demonstrate no
significant findings.

Lungs/Pleura: Mild centrilobular and paraseptal emphysema. Diffuse
bilateral bronchial wall thickening. Interval increase in density of
fibrosis about a treated nodule of the peripheral right upper lobe
with adjacent fiducial markers (series 4, image 39). New nodule of
the anterior right lower lobe abutting the major fissure measuring
0.7 cm (series 4, image 108). Significant interval enlargement of a
subpleural nodule abutting the right hemidiaphragm, now measuring
approximately 1.6 x 0.8 cm, previously 0.6 cm (series 4, image 124,
series 5, image 105). Fissural nodule of the right upper lobe
abutting the minor fissure is unchanged, measuring 0.8 cm (series 4,
image 71). Suspect some degree of mild underlying pulmonary fibrosis
in a pattern with slight apical to basal gradient, featuring
irregular peripheral interstitial opacity and ground-glass without
evidence of subpleural bronchiolectasis or honeycombing. No pleural
effusion or pneumothorax.

Upper Abdomen: No acute abnormality.

Musculoskeletal: No chest wall mass or suspicious bone lesions
identified.
IMPRESSION: 1. Interval increase in density of fibrosis about a treated nodule
of the peripheral right upper lobe with adjacent fiducial markers,
consistent with expected evolution of radiation fibrosis. Underlying
nodule can not be clearly appreciated.
2. New nodule of the anterior right lower lobe abutting the major
fissure measuring 0.7 cm. Significant interval enlargement of a
subpleural nodule abutting the right hemidiaphragm, now measuring
approximately 1.6 x 0.8 cm, previously 0.6 cm. New and enlarging
nodules are highly concerning for metastatic disease.
3. Suspect some degree of mild underlying pulmonary fibrosis in a
pattern with slight apical to basal gradient, featuring irregular
peripheral interstitial opacity and ground-glass without evidence of
subpleural bronchiolectasis or honeycombing. Findings are suggestive
of an alternative diagnosis (not UIP) per consensus guidelines,
leading differential consideration fibrotic phase NSIP. Diagnosis of
Idiopathic Pulmonary Fibrosis: An Official ATS/ERS/JRS/ALAT Clinical
Practice Guideline. Am J Respir Crit Care Med Vol 198, Velia 5,
ppe77-[id number][DATE].
4. Emphysema and diffuse bilateral bronchial wall thickening.
5. Coronary artery disease.

Aortic Atherosclerosis (78MNL-NZU.U) and Emphysema (78MNL-GHC.H).
